# Patient Record
Sex: Female | Born: 1980
Health system: Southern US, Community
[De-identification: ages and names within clinical notes are randomized; demographics above are authoritative.]

## PROBLEM LIST (undated history)

## (undated) DIAGNOSIS — T783XXA Angioneurotic edema, initial encounter: Secondary | ICD-10-CM

## (undated) DIAGNOSIS — I77810 Thoracic aortic ectasia: Secondary | ICD-10-CM

## (undated) DIAGNOSIS — R569 Unspecified convulsions: Secondary | ICD-10-CM

## (undated) DIAGNOSIS — I341 Nonrheumatic mitral (valve) prolapse: Secondary | ICD-10-CM

## (undated) DIAGNOSIS — F419 Anxiety disorder, unspecified: Secondary | ICD-10-CM

## (undated) DIAGNOSIS — T8859XA Other complications of anesthesia, initial encounter: Secondary | ICD-10-CM

## (undated) DIAGNOSIS — R51 Headache: Secondary | ICD-10-CM

## (undated) DIAGNOSIS — R079 Chest pain, unspecified: Secondary | ICD-10-CM

## (undated) DIAGNOSIS — G4730 Sleep apnea, unspecified: Secondary | ICD-10-CM

## (undated) DIAGNOSIS — N83209 Unspecified ovarian cyst, unspecified side: Secondary | ICD-10-CM

## (undated) DIAGNOSIS — F32A Depression, unspecified: Secondary | ICD-10-CM

## (undated) DIAGNOSIS — U071 COVID-19: Secondary | ICD-10-CM

## (undated) DIAGNOSIS — K760 Fatty (change of) liver, not elsewhere classified: Secondary | ICD-10-CM

## (undated) DIAGNOSIS — Z973 Presence of spectacles and contact lenses: Secondary | ICD-10-CM

## (undated) DIAGNOSIS — M797 Fibromyalgia: Secondary | ICD-10-CM

## (undated) DIAGNOSIS — K219 Gastro-esophageal reflux disease without esophagitis: Secondary | ICD-10-CM

## (undated) DIAGNOSIS — J189 Pneumonia, unspecified organism: Secondary | ICD-10-CM

## (undated) DIAGNOSIS — T4145XA Adverse effect of unspecified anesthetic, initial encounter: Secondary | ICD-10-CM

## (undated) DIAGNOSIS — D649 Anemia, unspecified: Secondary | ICD-10-CM

## (undated) DIAGNOSIS — Z87442 Personal history of urinary calculi: Secondary | ICD-10-CM

## (undated) DIAGNOSIS — J329 Chronic sinusitis, unspecified: Secondary | ICD-10-CM

## (undated) DIAGNOSIS — E119 Type 2 diabetes mellitus without complications: Secondary | ICD-10-CM

## (undated) DIAGNOSIS — F329 Major depressive disorder, single episode, unspecified: Secondary | ICD-10-CM

## (undated) HISTORY — DX: Unspecified convulsions: R56.9

## (undated) HISTORY — DX: Thoracic aortic ectasia: I77.810

## (undated) HISTORY — PX: KNEE ARTHROSCOPY: SHX127

## (undated) HISTORY — DX: Chest pain, unspecified: R07.9

## (undated) HISTORY — DX: Unspecified ovarian cyst, unspecified side: N83.209

## (undated) HISTORY — DX: Depression, unspecified: F32.A

## (undated) HISTORY — DX: Fatty (change of) liver, not elsewhere classified: K76.0

## (undated) HISTORY — DX: Fibromyalgia: M79.7

## (undated) HISTORY — DX: Angioneurotic edema, initial encounter: T78.3XXA

## (undated) HISTORY — DX: Type 2 diabetes mellitus without complications: E11.9

## (undated) HISTORY — PX: TUBAL LIGATION: SHX77

## (undated) HISTORY — DX: Major depressive disorder, single episode, unspecified: F32.9

## (undated) SURGERY — Surgical Case
Anesthesia: *Unknown

---
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
## 1998-08-25 ENCOUNTER — Ambulatory Visit (HOSPITAL_COMMUNITY): Admission: RE | Admit: 1998-08-25 | Discharge: 1998-08-25 | Payer: Self-pay | Admitting: *Deleted

## 2001-01-13 ENCOUNTER — Emergency Department (HOSPITAL_COMMUNITY): Admission: EM | Admit: 2001-01-13 | Discharge: 2001-01-13 | Payer: Self-pay | Admitting: *Deleted

## 2001-01-18 ENCOUNTER — Encounter: Admission: RE | Admit: 2001-01-18 | Discharge: 2001-01-18 | Payer: Self-pay | Admitting: Family Medicine

## 2001-01-18 ENCOUNTER — Encounter: Payer: Self-pay | Admitting: Family Medicine

## 2002-05-20 ENCOUNTER — Inpatient Hospital Stay (HOSPITAL_COMMUNITY): Admission: AD | Admit: 2002-05-20 | Discharge: 2002-05-20 | Payer: Self-pay | Admitting: Obstetrics and Gynecology

## 2002-05-21 ENCOUNTER — Encounter: Payer: Self-pay | Admitting: Emergency Medicine

## 2002-05-21 ENCOUNTER — Emergency Department (HOSPITAL_COMMUNITY): Admission: EM | Admit: 2002-05-21 | Discharge: 2002-05-21 | Payer: Self-pay | Admitting: Emergency Medicine

## 2003-09-24 ENCOUNTER — Emergency Department (HOSPITAL_COMMUNITY): Admission: AD | Admit: 2003-09-24 | Discharge: 2003-09-24 | Payer: Self-pay | Admitting: Family Medicine

## 2003-11-10 ENCOUNTER — Emergency Department (HOSPITAL_COMMUNITY): Admission: AD | Admit: 2003-11-10 | Discharge: 2003-11-10 | Payer: Self-pay | Admitting: Family Medicine

## 2004-01-17 ENCOUNTER — Emergency Department (HOSPITAL_COMMUNITY): Admission: AD | Admit: 2004-01-17 | Discharge: 2004-01-17 | Payer: Self-pay | Admitting: Emergency Medicine

## 2004-05-27 ENCOUNTER — Emergency Department (HOSPITAL_COMMUNITY): Admission: EM | Admit: 2004-05-27 | Discharge: 2004-05-27 | Payer: Self-pay | Admitting: Family Medicine

## 2004-09-10 ENCOUNTER — Emergency Department (HOSPITAL_COMMUNITY): Admission: EM | Admit: 2004-09-10 | Discharge: 2004-09-11 | Payer: Self-pay | Admitting: Emergency Medicine

## 2004-10-05 ENCOUNTER — Other Ambulatory Visit: Admission: RE | Admit: 2004-10-05 | Discharge: 2004-10-05 | Payer: Self-pay | Admitting: Family Medicine

## 2005-02-03 ENCOUNTER — Emergency Department (HOSPITAL_COMMUNITY): Admission: EM | Admit: 2005-02-03 | Discharge: 2005-02-03 | Payer: Self-pay | Admitting: Family Medicine

## 2005-02-11 ENCOUNTER — Emergency Department (HOSPITAL_COMMUNITY): Admission: EM | Admit: 2005-02-11 | Discharge: 2005-02-11 | Payer: Self-pay | Admitting: Family Medicine

## 2005-02-16 ENCOUNTER — Ambulatory Visit (HOSPITAL_COMMUNITY): Admission: RE | Admit: 2005-02-16 | Discharge: 2005-02-16 | Payer: Self-pay | Admitting: Obstetrics & Gynecology

## 2005-02-21 ENCOUNTER — Emergency Department (HOSPITAL_COMMUNITY): Admission: EM | Admit: 2005-02-21 | Discharge: 2005-02-21 | Payer: Self-pay | Admitting: Family Medicine

## 2005-03-21 ENCOUNTER — Ambulatory Visit (HOSPITAL_COMMUNITY): Admission: RE | Admit: 2005-03-21 | Discharge: 2005-03-21 | Payer: Self-pay | Admitting: Obstetrics & Gynecology

## 2005-03-31 ENCOUNTER — Ambulatory Visit (HOSPITAL_COMMUNITY): Admission: RE | Admit: 2005-03-31 | Discharge: 2005-03-31 | Payer: Self-pay | Admitting: Obstetrics & Gynecology

## 2005-03-31 ENCOUNTER — Encounter (INDEPENDENT_AMBULATORY_CARE_PROVIDER_SITE_OTHER): Payer: Self-pay | Admitting: *Deleted

## 2005-12-07 ENCOUNTER — Emergency Department (HOSPITAL_COMMUNITY): Admission: EM | Admit: 2005-12-07 | Discharge: 2005-12-07 | Payer: Self-pay | Admitting: Emergency Medicine

## 2006-01-05 ENCOUNTER — Emergency Department (HOSPITAL_COMMUNITY): Admission: EM | Admit: 2006-01-05 | Discharge: 2006-01-05 | Payer: Self-pay | Admitting: Family Medicine

## 2006-01-11 ENCOUNTER — Encounter: Admission: RE | Admit: 2006-01-11 | Discharge: 2006-01-11 | Payer: Self-pay | Admitting: Gastroenterology

## 2006-01-26 ENCOUNTER — Ambulatory Visit (HOSPITAL_COMMUNITY): Admission: RE | Admit: 2006-01-26 | Discharge: 2006-01-26 | Payer: Self-pay | Admitting: Gastroenterology

## 2006-02-01 ENCOUNTER — Encounter: Admission: RE | Admit: 2006-02-01 | Discharge: 2006-02-01 | Payer: Self-pay | Admitting: Gastroenterology

## 2006-06-18 ENCOUNTER — Emergency Department (HOSPITAL_COMMUNITY): Admission: EM | Admit: 2006-06-18 | Discharge: 2006-06-18 | Payer: Self-pay | Admitting: Emergency Medicine

## 2006-07-28 ENCOUNTER — Emergency Department (HOSPITAL_COMMUNITY): Admission: EM | Admit: 2006-07-28 | Discharge: 2006-07-29 | Payer: Self-pay | Admitting: Emergency Medicine

## 2006-08-05 ENCOUNTER — Emergency Department (HOSPITAL_COMMUNITY): Admission: EM | Admit: 2006-08-05 | Discharge: 2006-08-05 | Payer: Self-pay | Admitting: Emergency Medicine

## 2006-08-23 ENCOUNTER — Emergency Department (HOSPITAL_COMMUNITY): Admission: EM | Admit: 2006-08-23 | Discharge: 2006-08-23 | Payer: Self-pay | Admitting: *Deleted

## 2006-08-29 ENCOUNTER — Ambulatory Visit (HOSPITAL_BASED_OUTPATIENT_CLINIC_OR_DEPARTMENT_OTHER): Admission: RE | Admit: 2006-08-29 | Discharge: 2006-08-29 | Payer: Self-pay | Admitting: Urology

## 2006-11-16 ENCOUNTER — Emergency Department (HOSPITAL_COMMUNITY): Admission: EM | Admit: 2006-11-16 | Discharge: 2006-11-16 | Payer: Self-pay | Admitting: Family Medicine

## 2007-01-27 ENCOUNTER — Inpatient Hospital Stay (HOSPITAL_COMMUNITY): Admission: AD | Admit: 2007-01-27 | Discharge: 2007-01-27 | Payer: Self-pay | Admitting: Obstetrics & Gynecology

## 2007-07-06 ENCOUNTER — Inpatient Hospital Stay (HOSPITAL_COMMUNITY): Admission: AD | Admit: 2007-07-06 | Discharge: 2007-07-08 | Payer: Self-pay | Admitting: Obstetrics and Gynecology

## 2007-11-18 ENCOUNTER — Emergency Department (HOSPITAL_COMMUNITY): Admission: EM | Admit: 2007-11-18 | Discharge: 2007-11-18 | Payer: Self-pay | Admitting: Emergency Medicine

## 2007-11-23 ENCOUNTER — Emergency Department (HOSPITAL_COMMUNITY): Admission: EM | Admit: 2007-11-23 | Discharge: 2007-11-23 | Payer: Self-pay | Admitting: Emergency Medicine

## 2007-12-03 ENCOUNTER — Emergency Department (HOSPITAL_COMMUNITY): Admission: EM | Admit: 2007-12-03 | Discharge: 2007-12-03 | Payer: Self-pay | Admitting: Family Medicine

## 2008-08-02 ENCOUNTER — Emergency Department (HOSPITAL_COMMUNITY): Admission: EM | Admit: 2008-08-02 | Discharge: 2008-08-03 | Payer: Self-pay | Admitting: Emergency Medicine

## 2008-09-04 ENCOUNTER — Ambulatory Visit: Payer: Self-pay | Admitting: Internal Medicine

## 2008-09-16 ENCOUNTER — Ambulatory Visit: Payer: Self-pay

## 2008-09-16 ENCOUNTER — Encounter: Payer: Self-pay | Admitting: Internal Medicine

## 2008-10-14 ENCOUNTER — Observation Stay (HOSPITAL_COMMUNITY): Admission: AD | Admit: 2008-10-14 | Discharge: 2008-10-15 | Payer: Self-pay | Admitting: Obstetrics and Gynecology

## 2008-10-15 ENCOUNTER — Encounter: Payer: Self-pay | Admitting: Obstetrics and Gynecology

## 2008-10-31 ENCOUNTER — Emergency Department (HOSPITAL_COMMUNITY): Admission: EM | Admit: 2008-10-31 | Discharge: 2008-10-31 | Payer: Self-pay | Admitting: Emergency Medicine

## 2009-01-01 ENCOUNTER — Encounter: Payer: Self-pay | Admitting: Emergency Medicine

## 2009-01-02 ENCOUNTER — Observation Stay (HOSPITAL_COMMUNITY): Admission: AD | Admit: 2009-01-02 | Discharge: 2009-01-02 | Payer: Self-pay | Admitting: Obstetrics and Gynecology

## 2009-02-11 ENCOUNTER — Observation Stay (HOSPITAL_COMMUNITY): Admission: AD | Admit: 2009-02-11 | Discharge: 2009-02-12 | Payer: Self-pay | Admitting: Obstetrics & Gynecology

## 2009-03-24 ENCOUNTER — Ambulatory Visit: Payer: Self-pay | Admitting: Pulmonary Disease

## 2009-03-24 ENCOUNTER — Encounter (INDEPENDENT_AMBULATORY_CARE_PROVIDER_SITE_OTHER): Payer: Self-pay | Admitting: Obstetrics & Gynecology

## 2009-03-24 ENCOUNTER — Inpatient Hospital Stay (HOSPITAL_COMMUNITY): Admission: AD | Admit: 2009-03-24 | Discharge: 2009-03-30 | Payer: Self-pay | Admitting: Obstetrics & Gynecology

## 2009-03-24 ENCOUNTER — Ambulatory Visit: Payer: Self-pay | Admitting: Obstetrics & Gynecology

## 2009-04-02 ENCOUNTER — Inpatient Hospital Stay (HOSPITAL_COMMUNITY): Admission: AD | Admit: 2009-04-02 | Discharge: 2009-04-04 | Payer: Self-pay | Admitting: Obstetrics and Gynecology

## 2009-04-15 ENCOUNTER — Emergency Department (HOSPITAL_COMMUNITY): Admission: EM | Admit: 2009-04-15 | Discharge: 2009-04-16 | Payer: Self-pay | Admitting: Emergency Medicine

## 2009-07-11 ENCOUNTER — Ambulatory Visit: Payer: Self-pay | Admitting: Internal Medicine

## 2009-07-11 ENCOUNTER — Emergency Department (HOSPITAL_COMMUNITY): Admission: EM | Admit: 2009-07-11 | Discharge: 2009-07-11 | Payer: Self-pay | Admitting: Emergency Medicine

## 2009-07-11 ENCOUNTER — Inpatient Hospital Stay (HOSPITAL_COMMUNITY): Admission: EM | Admit: 2009-07-11 | Discharge: 2009-07-14 | Payer: Self-pay | Admitting: Emergency Medicine

## 2009-07-13 ENCOUNTER — Encounter (INDEPENDENT_AMBULATORY_CARE_PROVIDER_SITE_OTHER): Payer: Self-pay | Admitting: Neurology

## 2009-07-13 ENCOUNTER — Ambulatory Visit: Payer: Self-pay | Admitting: Surgery

## 2009-08-10 ENCOUNTER — Emergency Department (HOSPITAL_COMMUNITY): Admission: EM | Admit: 2009-08-10 | Discharge: 2009-08-10 | Payer: Self-pay | Admitting: Emergency Medicine

## 2009-10-21 ENCOUNTER — Encounter: Admission: RE | Admit: 2009-10-21 | Discharge: 2009-10-21 | Payer: Self-pay | Admitting: Orthopedic Surgery

## 2009-11-13 ENCOUNTER — Ambulatory Visit (HOSPITAL_COMMUNITY): Admission: RE | Admit: 2009-11-13 | Discharge: 2009-11-13 | Payer: Self-pay | Admitting: Neurology

## 2010-04-13 ENCOUNTER — Emergency Department (HOSPITAL_COMMUNITY): Admission: EM | Admit: 2010-04-13 | Discharge: 2010-04-13 | Payer: Self-pay | Admitting: Emergency Medicine

## 2010-05-20 ENCOUNTER — Emergency Department (HOSPITAL_COMMUNITY): Admission: EM | Admit: 2010-05-20 | Discharge: 2010-05-20 | Payer: Self-pay | Admitting: Emergency Medicine

## 2010-07-08 ENCOUNTER — Emergency Department (HOSPITAL_COMMUNITY)
Admission: EM | Admit: 2010-07-08 | Discharge: 2010-07-08 | Payer: Self-pay | Source: Home / Self Care | Admitting: Emergency Medicine

## 2010-08-24 ENCOUNTER — Inpatient Hospital Stay (HOSPITAL_COMMUNITY): Admission: EM | Admit: 2010-08-24 | Discharge: 2010-08-26 | Payer: Self-pay | Admitting: Emergency Medicine

## 2010-12-29 ENCOUNTER — Emergency Department (HOSPITAL_COMMUNITY)
Admission: EM | Admit: 2010-12-29 | Discharge: 2010-12-29 | Payer: Self-pay | Source: Home / Self Care | Admitting: Family Medicine

## 2010-12-29 LAB — POCT URINALYSIS DIPSTICK
Bilirubin Urine: NEGATIVE
Hgb urine dipstick: NEGATIVE
Ketones, ur: NEGATIVE mg/dL
Nitrite: NEGATIVE
Protein, ur: NEGATIVE mg/dL
Specific Gravity, Urine: 1.025 (ref 1.005–1.030)
Urine Glucose, Fasting: NEGATIVE mg/dL
Urobilinogen, UA: 0.2 mg/dL (ref 0.0–1.0)
pH: 6 (ref 5.0–8.0)

## 2010-12-29 LAB — POCT PREGNANCY, URINE: Preg Test, Ur: NEGATIVE

## 2011-02-17 LAB — URINE MICROSCOPIC-ADD ON

## 2011-02-17 LAB — DIFFERENTIAL
Basophils Absolute: 0 10*3/uL (ref 0.0–0.1)
Basophils Relative: 0 % (ref 0–1)
Eosinophils Absolute: 0.1 10*3/uL (ref 0.0–0.7)
Eosinophils Relative: 1 % (ref 0–5)
Lymphocytes Relative: 23 % (ref 12–46)
Lymphs Abs: 1.7 10*3/uL (ref 0.7–4.0)
Monocytes Absolute: 0.4 10*3/uL (ref 0.1–1.0)
Monocytes Relative: 6 % (ref 3–12)
Neutro Abs: 5.1 10*3/uL (ref 1.7–7.7)
Neutrophils Relative %: 69 % (ref 43–77)

## 2011-02-17 LAB — CBC
HCT: 36.8 % (ref 36.0–46.0)
HCT: 38.3 % (ref 36.0–46.0)
Hemoglobin: 11.9 g/dL — ABNORMAL LOW (ref 12.0–15.0)
Hemoglobin: 12.6 g/dL (ref 12.0–15.0)
MCH: 27 pg (ref 26.0–34.0)
MCH: 27.3 pg (ref 26.0–34.0)
MCHC: 32.3 g/dL (ref 30.0–36.0)
MCHC: 32.9 g/dL (ref 30.0–36.0)
MCV: 83.1 fL (ref 78.0–100.0)
MCV: 83.4 fL (ref 78.0–100.0)
Platelets: 161 10*3/uL (ref 150–400)
Platelets: 178 10*3/uL (ref 150–400)
RBC: 4.41 MIL/uL (ref 3.87–5.11)
RBC: 4.61 MIL/uL (ref 3.87–5.11)
RDW: 12.9 % (ref 11.5–15.5)
RDW: 13 % (ref 11.5–15.5)
WBC: 7.3 10*3/uL (ref 4.0–10.5)
WBC: 7.4 10*3/uL (ref 4.0–10.5)

## 2011-02-17 LAB — URINALYSIS, ROUTINE W REFLEX MICROSCOPIC
Bilirubin Urine: NEGATIVE
Glucose, UA: NEGATIVE mg/dL
Ketones, ur: NEGATIVE mg/dL
Nitrite: NEGATIVE
Protein, ur: NEGATIVE mg/dL
Specific Gravity, Urine: 1.012 (ref 1.005–1.030)
Urobilinogen, UA: 0.2 mg/dL (ref 0.0–1.0)
pH: 6.5 (ref 5.0–8.0)

## 2011-02-17 LAB — COMPREHENSIVE METABOLIC PANEL
ALT: 17 U/L (ref 0–35)
AST: 23 U/L (ref 0–37)
Albumin: 3.4 g/dL — ABNORMAL LOW (ref 3.5–5.2)
Alkaline Phosphatase: 56 U/L (ref 39–117)
BUN: 11 mg/dL (ref 6–23)
CO2: 27 mEq/L (ref 19–32)
Calcium: 9.3 mg/dL (ref 8.4–10.5)
Chloride: 103 mEq/L (ref 96–112)
Creatinine, Ser: 0.77 mg/dL (ref 0.4–1.2)
GFR calc Af Amer: >60 mL/min (ref >60)
GFR calc non Af Amer: >60 mL/min (ref >60)
Glucose, Bld: 218 mg/dL — ABNORMAL HIGH (ref 70–99)
Potassium: 4.1 mEq/L (ref 3.5–5.1)
Sodium: 140 mEq/L (ref 135–145)
Total Bilirubin: 0.2 mg/dL — ABNORMAL LOW (ref 0.3–1.2)
Total Protein: 7 g/dL (ref 6.0–8.3)

## 2011-02-17 LAB — POCT I-STAT, CHEM 8
BUN: 7 mg/dL (ref 6–23)
Calcium, Ion: 1.16 mmol/L (ref 1.12–1.32)
Chloride: 102 mEq/L (ref 96–112)
Creatinine, Ser: 0.6 mg/dL (ref 0.4–1.2)
Glucose, Bld: 102 mg/dL — ABNORMAL HIGH (ref 70–99)
HCT: 40 % (ref 36.0–46.0)
Hemoglobin: 13.6 g/dL (ref 12.0–15.0)
Potassium: 3.5 mEq/L (ref 3.5–5.1)
Sodium: 138 mEq/L (ref 135–145)
TCO2: 28 mmol/L (ref 0–100)

## 2011-02-17 LAB — SEDIMENTATION RATE: Sed Rate: 32 mm/hr — ABNORMAL HIGH (ref 0–22)

## 2011-02-17 LAB — HEMOGLOBIN A1C
Hgb A1c MFr Bld: 5.8 % — ABNORMAL HIGH (ref <5.7)
Mean Plasma Glucose: 120 mg/dL — ABNORMAL HIGH (ref <117)

## 2011-02-17 LAB — PREGNANCY, URINE: Preg Test, Ur: NEGATIVE

## 2011-02-18 LAB — DIFFERENTIAL
Basophils Absolute: 0 10*3/uL (ref 0.0–0.1)
Basophils Relative: 0 % (ref 0–1)
Eosinophils Absolute: 0.2 10*3/uL (ref 0.0–0.7)
Eosinophils Relative: 2 % (ref 0–5)
Lymphocytes Relative: 25 % (ref 12–46)
Lymphs Abs: 2.5 10*3/uL (ref 0.7–4.0)
Monocytes Absolute: 0.6 10*3/uL (ref 0.1–1.0)
Monocytes Relative: 6 % (ref 3–12)
Neutro Abs: 6.6 10*3/uL (ref 1.7–7.7)
Neutrophils Relative %: 66 % (ref 43–77)

## 2011-02-18 LAB — BASIC METABOLIC PANEL
BUN: 9 mg/dL (ref 6–23)
CO2: 25 mEq/L (ref 19–32)
Calcium: 9.1 mg/dL (ref 8.4–10.5)
Chloride: 105 mEq/L (ref 96–112)
Creatinine, Ser: 0.71 mg/dL (ref 0.4–1.2)
GFR calc Af Amer: >60 mL/min (ref >60)
GFR calc non Af Amer: >60 mL/min (ref >60)
Glucose, Bld: 118 mg/dL — ABNORMAL HIGH (ref 70–99)
Potassium: 3.8 mEq/L (ref 3.5–5.1)
Sodium: 139 mEq/L (ref 135–145)

## 2011-02-18 LAB — CBC
HCT: 40.3 % (ref 36.0–46.0)
Hemoglobin: 13.3 g/dL (ref 12.0–15.0)
MCH: 27.5 pg (ref 26.0–34.0)
MCHC: 33 g/dL (ref 30.0–36.0)
MCV: 83.4 fL (ref 78.0–100.0)
Platelets: 165 10*3/uL (ref 150–400)
RBC: 4.83 MIL/uL (ref 3.87–5.11)
RDW: 13.4 % (ref 11.5–15.5)
WBC: 10 10*3/uL (ref 4.0–10.5)

## 2011-03-12 LAB — CK TOTAL AND CKMB (NOT AT ARMC)
CK, MB: 0.9 ng/mL (ref 0.3–4.0)
Relative Index: INVALID (ref 0.0–2.5)
Total CK: 47 U/L (ref 7–177)

## 2011-03-12 LAB — ETHANOL: Alcohol, Ethyl (B): <5 mg/dL (ref 0–10)

## 2011-03-12 LAB — RAPID URINE DRUG SCREEN, HOSP PERFORMED
Amphetamines: NOT DETECTED
Barbiturates: NOT DETECTED
Benzodiazepines: NOT DETECTED
Cocaine: NOT DETECTED
Opiates: NOT DETECTED
Tetrahydrocannabinol: NOT DETECTED

## 2011-03-12 LAB — DIFFERENTIAL
Basophils Absolute: 0 10*3/uL (ref 0.0–0.1)
Basophils Relative: 0 % (ref 0–1)
Eosinophils Absolute: 0.1 10*3/uL (ref 0.0–0.7)
Eosinophils Relative: 2 % (ref 0–5)
Lymphocytes Relative: 32 % (ref 12–46)
Lymphs Abs: 1.9 10*3/uL (ref 0.7–4.0)
Monocytes Absolute: 0.5 10*3/uL (ref 0.1–1.0)
Monocytes Relative: 8 % (ref 3–12)
Neutro Abs: 3.5 10*3/uL (ref 1.7–7.7)
Neutrophils Relative %: 58 % (ref 43–77)

## 2011-03-12 LAB — COMPREHENSIVE METABOLIC PANEL
ALT: 17 U/L (ref 0–35)
AST: 28 U/L (ref 0–37)
Albumin: 3.6 g/dL (ref 3.5–5.2)
Alkaline Phosphatase: 65 U/L (ref 39–117)
BUN: 11 mg/dL (ref 6–23)
CO2: 24 mEq/L (ref 19–32)
Calcium: 8.2 mg/dL — ABNORMAL LOW (ref 8.4–10.5)
Chloride: 104 mEq/L (ref 96–112)
Creatinine, Ser: 0.68 mg/dL (ref 0.4–1.2)
GFR calc Af Amer: >60 mL/min (ref >60)
GFR calc non Af Amer: >60 mL/min (ref >60)
Glucose, Bld: 213 mg/dL — ABNORMAL HIGH (ref 70–99)
Potassium: 3.5 mEq/L (ref 3.5–5.1)
Sodium: 133 mEq/L — ABNORMAL LOW (ref 135–145)
Total Bilirubin: 0.3 mg/dL (ref 0.3–1.2)
Total Protein: 6.8 g/dL (ref 6.0–8.3)

## 2011-03-12 LAB — URINALYSIS, ROUTINE W REFLEX MICROSCOPIC
Bilirubin Urine: NEGATIVE
Glucose, UA: NEGATIVE mg/dL
Hgb urine dipstick: NEGATIVE
Ketones, ur: NEGATIVE mg/dL
Leukocytes, UA: NEGATIVE
Nitrite: POSITIVE — AB
Protein, ur: NEGATIVE mg/dL
Specific Gravity, Urine: 1.02 (ref 1.005–1.030)
Urobilinogen, UA: 0.2 mg/dL (ref 0.0–1.0)
pH: 7 (ref 5.0–8.0)

## 2011-03-12 LAB — POCT I-STAT, CHEM 8
BUN: 16 mg/dL (ref 6–23)
Calcium, Ion: 1.18 mmol/L (ref 1.12–1.32)
Chloride: 101 mEq/L (ref 96–112)
Creatinine, Ser: 0.6 mg/dL (ref 0.4–1.2)
Glucose, Bld: 64 mg/dL — ABNORMAL LOW (ref 70–99)
HCT: 43 % (ref 36.0–46.0)
Hemoglobin: 14.6 g/dL (ref 12.0–15.0)
Potassium: 3.9 mEq/L (ref 3.5–5.1)
Sodium: 139 mEq/L (ref 135–145)
TCO2: 27 mmol/L (ref 0–100)

## 2011-03-12 LAB — CBC
HCT: 39.8 % (ref 36.0–46.0)
Hemoglobin: 13.3 g/dL (ref 12.0–15.0)
MCHC: 33.4 g/dL (ref 30.0–36.0)
MCV: 85.3 fL (ref 78.0–100.0)
Platelets: 162 10*3/uL (ref 150–400)
RBC: 4.67 MIL/uL (ref 3.87–5.11)
RDW: 12.6 % (ref 11.5–15.5)
WBC: 6.1 10*3/uL (ref 4.0–10.5)

## 2011-03-12 LAB — HEMOGLOBIN A1C
Hgb A1c MFr Bld: 5.2 % (ref 4.6–6.1)
Mean Plasma Glucose: 103 mg/dL

## 2011-03-12 LAB — GLUCOSE, CAPILLARY
Glucose-Capillary: 102 mg/dL — ABNORMAL HIGH (ref 70–99)
Glucose-Capillary: 104 mg/dL — ABNORMAL HIGH (ref 70–99)
Glucose-Capillary: 124 mg/dL — ABNORMAL HIGH (ref 70–99)
Glucose-Capillary: 128 mg/dL — ABNORMAL HIGH (ref 70–99)
Glucose-Capillary: 165 mg/dL — ABNORMAL HIGH (ref 70–99)
Glucose-Capillary: 199 mg/dL — ABNORMAL HIGH (ref 70–99)
Glucose-Capillary: 71 mg/dL (ref 70–99)
Glucose-Capillary: 72 mg/dL (ref 70–99)
Glucose-Capillary: 76 mg/dL (ref 70–99)
Glucose-Capillary: 78 mg/dL (ref 70–99)

## 2011-03-12 LAB — URINE MICROSCOPIC-ADD ON

## 2011-03-12 LAB — LIPID PANEL
Cholesterol: 157 mg/dL (ref 0–200)
HDL: 61 mg/dL (ref >39)
LDL Cholesterol: 86 mg/dL (ref 0–99)
Total CHOL/HDL Ratio: 2.6 RATIO
Triglycerides: 48 mg/dL (ref <150)
VLDL: 10 mg/dL (ref 0–40)

## 2011-03-12 LAB — POCT PREGNANCY, URINE: Preg Test, Ur: NEGATIVE

## 2011-03-12 LAB — PROTIME-INR
INR: 1 (ref 0.00–1.49)
Prothrombin Time: 12.8 seconds (ref 11.6–15.2)

## 2011-03-12 LAB — APTT: aPTT: 32 seconds (ref 24–37)

## 2011-03-12 LAB — TROPONIN I: Troponin I: <0.01 ng/mL (ref 0.00–0.06)

## 2011-03-15 LAB — DIFFERENTIAL
Basophils Absolute: 0 10*3/uL (ref 0.0–0.1)
Basophils Absolute: 0 10*3/uL (ref 0.0–0.1)
Basophils Relative: 0 % (ref 0–1)
Basophils Relative: 0 % (ref 0–1)
Eosinophils Absolute: 0 10*3/uL (ref 0.0–0.7)
Eosinophils Absolute: 0.3 10*3/uL (ref 0.0–0.7)
Eosinophils Relative: 0 % (ref 0–5)
Eosinophils Relative: 3 % (ref 0–5)
Lymphocytes Relative: 13 % (ref 12–46)
Lymphocytes Relative: 18 % (ref 12–46)
Lymphs Abs: 1.5 10*3/uL (ref 0.7–4.0)
Lymphs Abs: 1.9 10*3/uL (ref 0.7–4.0)
Monocytes Absolute: 0.5 10*3/uL (ref 0.1–1.0)
Monocytes Absolute: 0.7 10*3/uL (ref 0.1–1.0)
Monocytes Relative: 5 % (ref 3–12)
Monocytes Relative: 7 % (ref 3–12)
Neutro Abs: 7.4 10*3/uL (ref 1.7–7.7)
Neutro Abs: 9.1 10*3/uL — ABNORMAL HIGH (ref 1.7–7.7)
Neutrophils Relative %: 72 % (ref 43–77)
Neutrophils Relative %: 82 % — ABNORMAL HIGH (ref 43–77)

## 2011-03-15 LAB — URINALYSIS, ROUTINE W REFLEX MICROSCOPIC
Glucose, UA: NEGATIVE mg/dL
Hgb urine dipstick: NEGATIVE
Nitrite: NEGATIVE
Protein, ur: NEGATIVE mg/dL
Specific Gravity, Urine: 1.025 (ref 1.005–1.030)
Urobilinogen, UA: 0.2 mg/dL (ref 0.0–1.0)
pH: 6 (ref 5.0–8.0)

## 2011-03-15 LAB — COMPREHENSIVE METABOLIC PANEL
ALT: 13 U/L (ref 0–35)
AST: 16 U/L (ref 0–37)
Albumin: 3.1 g/dL — ABNORMAL LOW (ref 3.5–5.2)
Alkaline Phosphatase: 60 U/L (ref 39–117)
BUN: 14 mg/dL (ref 6–23)
CO2: 26 mEq/L (ref 19–32)
Calcium: 9.1 mg/dL (ref 8.4–10.5)
Chloride: 106 mEq/L (ref 96–112)
Creatinine, Ser: 0.68 mg/dL (ref 0.4–1.2)
GFR calc Af Amer: >60 mL/min (ref >60)
GFR calc non Af Amer: >60 mL/min (ref >60)
Glucose, Bld: 102 mg/dL — ABNORMAL HIGH (ref 70–99)
Potassium: 4.7 mEq/L (ref 3.5–5.1)
Sodium: 138 mEq/L (ref 135–145)
Total Bilirubin: 0.4 mg/dL (ref 0.3–1.2)
Total Protein: 6.2 g/dL (ref 6.0–8.3)

## 2011-03-15 LAB — CBC
HCT: 29.9 % — ABNORMAL LOW (ref 36.0–46.0)
HCT: 31.7 % — ABNORMAL LOW (ref 36.0–46.0)
Hemoglobin: 10.2 g/dL — ABNORMAL LOW (ref 12.0–15.0)
Hemoglobin: 10.9 g/dL — ABNORMAL LOW (ref 12.0–15.0)
MCHC: 34 g/dL (ref 30.0–36.0)
MCHC: 34.6 g/dL (ref 30.0–36.0)
MCV: 90 fL (ref 78.0–100.0)
MCV: 92.3 fL (ref 78.0–100.0)
Platelets: 250 10*3/uL (ref 150–400)
Platelets: 270 10*3/uL (ref 150–400)
RBC: 3.32 MIL/uL — ABNORMAL LOW (ref 3.87–5.11)
RBC: 3.43 MIL/uL — ABNORMAL LOW (ref 3.87–5.11)
RDW: 13.6 % (ref 11.5–15.5)
RDW: 13.6 % (ref 11.5–15.5)
WBC: 10.4 10*3/uL (ref 4.0–10.5)
WBC: 11.2 10*3/uL — ABNORMAL HIGH (ref 4.0–10.5)

## 2011-03-15 LAB — GC/CHLAMYDIA PROBE AMP, GENITAL
Chlamydia, DNA Probe: NEGATIVE
GC Probe Amp, Genital: NEGATIVE

## 2011-03-15 LAB — WET PREP, GENITAL
Clue Cells Wet Prep HPF POC: NONE SEEN
Trich, Wet Prep: NONE SEEN
Yeast Wet Prep HPF POC: NONE SEEN

## 2011-03-15 LAB — LIPASE, BLOOD: Lipase: 14 U/L (ref 11–59)

## 2011-03-16 LAB — CBC
HCT: 14.1 % — ABNORMAL LOW (ref 36.0–46.0)
HCT: 26.7 % — ABNORMAL LOW (ref 36.0–46.0)
HCT: 26.8 % — ABNORMAL LOW (ref 36.0–46.0)
HCT: 27.8 % — ABNORMAL LOW (ref 36.0–46.0)
HCT: 31.2 % — ABNORMAL LOW (ref 36.0–46.0)
HCT: 31.6 % — ABNORMAL LOW (ref 36.0–46.0)
HCT: 32.8 % — ABNORMAL LOW (ref 36.0–46.0)
HCT: 36.5 % (ref 36.0–46.0)
HCT: 38.5 % (ref 36.0–46.0)
HCT: 38.6 % (ref 36.0–46.0)
HCT: 38.8 % (ref 36.0–46.0)
Hemoglobin: 10.9 g/dL — ABNORMAL LOW (ref 12.0–15.0)
Hemoglobin: 11.2 g/dL — ABNORMAL LOW (ref 12.0–15.0)
Hemoglobin: 11.4 g/dL — ABNORMAL LOW (ref 12.0–15.0)
Hemoglobin: 12.9 g/dL (ref 12.0–15.0)
Hemoglobin: 13.3 g/dL (ref 12.0–15.0)
Hemoglobin: 13.3 g/dL (ref 12.0–15.0)
Hemoglobin: 13.6 g/dL (ref 12.0–15.0)
Hemoglobin: 4.7 g/dL — CL (ref 12.0–15.0)
Hemoglobin: 9.1 g/dL — ABNORMAL LOW (ref 12.0–15.0)
Hemoglobin: 9.5 g/dL — ABNORMAL LOW (ref 12.0–15.0)
Hemoglobin: 9.7 g/dL — ABNORMAL LOW (ref 12.0–15.0)
MCHC: 33.4 g/dL (ref 30.0–36.0)
MCHC: 34.1 g/dL (ref 30.0–36.0)
MCHC: 34.2 g/dL (ref 30.0–36.0)
MCHC: 34.6 g/dL (ref 30.0–36.0)
MCHC: 34.8 g/dL (ref 30.0–36.0)
MCHC: 34.9 g/dL (ref 30.0–36.0)
MCHC: 35.1 g/dL (ref 30.0–36.0)
MCHC: 35.3 g/dL (ref 30.0–36.0)
MCHC: 35.3 g/dL (ref 30.0–36.0)
MCHC: 35.3 g/dL (ref 30.0–36.0)
MCHC: 35.5 g/dL (ref 30.0–36.0)
MCV: 90.3 fL (ref 78.0–100.0)
MCV: 90.8 fL (ref 78.0–100.0)
MCV: 92.1 fL (ref 78.0–100.0)
MCV: 92.4 fL (ref 78.0–100.0)
MCV: 92.5 fL (ref 78.0–100.0)
MCV: 92.5 fL (ref 78.0–100.0)
MCV: 92.7 fL (ref 78.0–100.0)
MCV: 92.7 fL (ref 78.0–100.0)
MCV: 93.1 fL (ref 78.0–100.0)
MCV: 93.2 fL (ref 78.0–100.0)
MCV: 93.8 fL (ref 78.0–100.0)
Platelets: 111 10*3/uL — ABNORMAL LOW (ref 150–400)
Platelets: 117 10*3/uL — ABNORMAL LOW (ref 150–400)
Platelets: 174 10*3/uL (ref 150–400)
Platelets: 237 10*3/uL (ref 150–400)
Platelets: 26 10*3/uL — CL (ref 150–400)
Platelets: 28 10*3/uL — CL (ref 150–400)
Platelets: 39 10*3/uL — CL (ref 150–400)
Platelets: 41 10*3/uL — CL (ref 150–400)
Platelets: 62 10*3/uL — ABNORMAL LOW (ref 150–400)
Platelets: 76 10*3/uL — ABNORMAL LOW (ref 150–400)
Platelets: 81 10*3/uL — ABNORMAL LOW (ref 150–400)
RBC: 1.51 MIL/uL — ABNORMAL LOW (ref 3.87–5.11)
RBC: 2.9 MIL/uL — ABNORMAL LOW (ref 3.87–5.11)
RBC: 2.94 MIL/uL — ABNORMAL LOW (ref 3.87–5.11)
RBC: 3.01 MIL/uL — ABNORMAL LOW (ref 3.87–5.11)
RBC: 3.35 MIL/uL — ABNORMAL LOW (ref 3.87–5.11)
RBC: 3.41 MIL/uL — ABNORMAL LOW (ref 3.87–5.11)
RBC: 3.56 MIL/uL — ABNORMAL LOW (ref 3.87–5.11)
RBC: 3.94 MIL/uL (ref 3.87–5.11)
RBC: 4.11 MIL/uL (ref 3.87–5.11)
RBC: 4.17 MIL/uL (ref 3.87–5.11)
RBC: 4.3 MIL/uL (ref 3.87–5.11)
RDW: 13.7 % (ref 11.5–15.5)
RDW: 13.8 % (ref 11.5–15.5)
RDW: 13.9 % (ref 11.5–15.5)
RDW: 14.4 % (ref 11.5–15.5)
RDW: 14.4 % (ref 11.5–15.5)
RDW: 14.4 % (ref 11.5–15.5)
RDW: 14.5 % (ref 11.5–15.5)
RDW: 14.6 % (ref 11.5–15.5)
RDW: 15 % (ref 11.5–15.5)
RDW: 15.2 % (ref 11.5–15.5)
RDW: 15.3 % (ref 11.5–15.5)
WBC: 10.9 10*3/uL — ABNORMAL HIGH (ref 4.0–10.5)
WBC: 11.2 10*3/uL — ABNORMAL HIGH (ref 4.0–10.5)
WBC: 16.2 10*3/uL — ABNORMAL HIGH (ref 4.0–10.5)
WBC: 18.8 10*3/uL — ABNORMAL HIGH (ref 4.0–10.5)
WBC: 21 10*3/uL — ABNORMAL HIGH (ref 4.0–10.5)
WBC: 22.1 10*3/uL — ABNORMAL HIGH (ref 4.0–10.5)
WBC: 22.7 10*3/uL — ABNORMAL HIGH (ref 4.0–10.5)
WBC: 23.7 10*3/uL — ABNORMAL HIGH (ref 4.0–10.5)
WBC: 26.4 10*3/uL — ABNORMAL HIGH (ref 4.0–10.5)
WBC: 27.7 10*3/uL — ABNORMAL HIGH (ref 4.0–10.5)
WBC: 8.6 10*3/uL (ref 4.0–10.5)

## 2011-03-16 LAB — PREPARE FRESH FROZEN PLASMA

## 2011-03-16 LAB — URINALYSIS, ROUTINE W REFLEX MICROSCOPIC
Bilirubin Urine: NEGATIVE
Glucose, UA: 100 mg/dL — AB
Glucose, UA: NEGATIVE mg/dL
Hgb urine dipstick: NEGATIVE
Ketones, ur: 40 mg/dL — AB
Ketones, ur: NEGATIVE mg/dL
Leukocytes, UA: NEGATIVE
Nitrite: NEGATIVE
Nitrite: NEGATIVE
Protein, ur: NEGATIVE mg/dL
Protein, ur: NEGATIVE mg/dL
Specific Gravity, Urine: 1.01 (ref 1.005–1.030)
Specific Gravity, Urine: 1.02 (ref 1.005–1.030)
Urobilinogen, UA: 2 mg/dL — ABNORMAL HIGH (ref 0.0–1.0)
Urobilinogen, UA: 2 mg/dL — ABNORMAL HIGH (ref 0.0–1.0)
pH: 6.5 (ref 5.0–8.0)
pH: 7 (ref 5.0–8.0)

## 2011-03-16 LAB — BASIC METABOLIC PANEL
BUN: 6 mg/dL (ref 6–23)
BUN: 8 mg/dL (ref 6–23)
CO2: 19 mEq/L (ref 19–32)
CO2: 23 mEq/L (ref 19–32)
Calcium: 7 mg/dL — ABNORMAL LOW (ref 8.4–10.5)
Calcium: 7.2 mg/dL — ABNORMAL LOW (ref 8.4–10.5)
Chloride: 105 mEq/L (ref 96–112)
Chloride: 106 mEq/L (ref 96–112)
Creatinine, Ser: 0.68 mg/dL (ref 0.4–1.2)
Creatinine, Ser: 0.69 mg/dL (ref 0.4–1.2)
GFR calc Af Amer: >60 mL/min (ref >60)
GFR calc Af Amer: >60 mL/min (ref >60)
GFR calc non Af Amer: >60 mL/min (ref >60)
GFR calc non Af Amer: >60 mL/min (ref >60)
Glucose, Bld: 134 mg/dL — ABNORMAL HIGH (ref 70–99)
Glucose, Bld: 91 mg/dL (ref 70–99)
Potassium: 3.5 mEq/L (ref 3.5–5.1)
Potassium: 3.8 mEq/L (ref 3.5–5.1)
Sodium: 133 mEq/L — ABNORMAL LOW (ref 135–145)
Sodium: 135 mEq/L (ref 135–145)

## 2011-03-16 LAB — COMPREHENSIVE METABOLIC PANEL
ALT: 17 U/L (ref 0–35)
ALT: 18 U/L (ref 0–35)
ALT: 8 U/L (ref 0–35)
AST: 20 U/L (ref 0–37)
AST: 21 U/L (ref 0–37)
AST: 21 U/L (ref 0–37)
Albumin: 1.1 g/dL — ABNORMAL LOW (ref 3.5–5.2)
Albumin: 1.7 g/dL — ABNORMAL LOW (ref 3.5–5.2)
Albumin: 2.4 g/dL — ABNORMAL LOW (ref 3.5–5.2)
Alkaline Phosphatase: 38 U/L — ABNORMAL LOW (ref 39–117)
Alkaline Phosphatase: 56 U/L (ref 39–117)
Alkaline Phosphatase: 77 U/L (ref 39–117)
BUN: 10 mg/dL (ref 6–23)
BUN: 11 mg/dL (ref 6–23)
BUN: 7 mg/dL (ref 6–23)
CO2: 14 mEq/L — ABNORMAL LOW (ref 19–32)
CO2: 26 mEq/L (ref 19–32)
CO2: 28 mEq/L (ref 19–32)
Calcium: 7.5 mg/dL — ABNORMAL LOW (ref 8.4–10.5)
Calcium: 7.8 mg/dL — ABNORMAL LOW (ref 8.4–10.5)
Calcium: 8.3 mg/dL — ABNORMAL LOW (ref 8.4–10.5)
Chloride: 103 mEq/L (ref 96–112)
Chloride: 105 mEq/L (ref 96–112)
Chloride: 108 mEq/L (ref 96–112)
Creatinine, Ser: 0.53 mg/dL (ref 0.4–1.2)
Creatinine, Ser: 0.55 mg/dL (ref 0.4–1.2)
Creatinine, Ser: 1.14 mg/dL (ref 0.4–1.2)
GFR calc Af Amer: >60 mL/min (ref >60)
GFR calc Af Amer: >60 mL/min (ref >60)
GFR calc Af Amer: >60 mL/min (ref >60)
GFR calc non Af Amer: 56 mL/min — ABNORMAL LOW (ref >60)
GFR calc non Af Amer: >60 mL/min (ref >60)
GFR calc non Af Amer: >60 mL/min (ref >60)
Glucose, Bld: 103 mg/dL — ABNORMAL HIGH (ref 70–99)
Glucose, Bld: 238 mg/dL — ABNORMAL HIGH (ref 70–99)
Glucose, Bld: 96 mg/dL (ref 70–99)
Potassium: 3.3 mEq/L — ABNORMAL LOW (ref 3.5–5.1)
Potassium: 3.6 mEq/L (ref 3.5–5.1)
Potassium: 3.9 mEq/L (ref 3.5–5.1)
Sodium: 136 mEq/L (ref 135–145)
Sodium: 138 mEq/L (ref 135–145)
Sodium: 138 mEq/L (ref 135–145)
Total Bilirubin: 0.3 mg/dL (ref 0.3–1.2)
Total Bilirubin: 0.6 mg/dL (ref 0.3–1.2)
Total Bilirubin: 0.6 mg/dL (ref 0.3–1.2)
Total Protein: 4.3 g/dL — ABNORMAL LOW (ref 6.0–8.3)
Total Protein: 5.7 g/dL — ABNORMAL LOW (ref 6.0–8.3)
Total Protein: <3 g/dL — ABNORMAL LOW (ref 6.0–8.3)

## 2011-03-16 LAB — CROSSMATCH
ABO/RH(D): A POS
Antibody Screen: NEGATIVE

## 2011-03-16 LAB — BLOOD GAS, VENOUS
Acid-base deficit: 6.1 mmol/L — ABNORMAL HIGH (ref 0.0–2.0)
Bicarbonate: 19.2 mEq/L — ABNORMAL LOW (ref 20.0–24.0)
Drawn by: 28678
FIO2: 0.35 %
MECHVT: 500 mL
O2 Saturation: 100 %
PEEP: 5 cmH2O
RATE: 12 resp/min
TCO2: 20.4 mmol/L (ref 0–100)
pCO2, Ven: 39.4 mmHg — ABNORMAL LOW (ref 45.0–50.0)
pH, Ven: 7.31 — ABNORMAL HIGH (ref 7.250–7.300)
pO2, Ven: 90.5 mmHg — ABNORMAL HIGH (ref 30.0–45.0)

## 2011-03-16 LAB — CULTURE, BLOOD (ROUTINE X 2)
Culture: NO GROWTH
Culture: NO GROWTH

## 2011-03-16 LAB — DIFFERENTIAL
Basophils Absolute: 0 10*3/uL (ref 0.0–0.1)
Basophils Absolute: 0 10*3/uL (ref 0.0–0.1)
Basophils Relative: 0 % (ref 0–1)
Basophils Relative: 0 % (ref 0–1)
Eosinophils Absolute: 0 10*3/uL (ref 0.0–0.7)
Eosinophils Absolute: 0.1 10*3/uL (ref 0.0–0.7)
Eosinophils Relative: 0 % (ref 0–5)
Eosinophils Relative: 1 % (ref 0–5)
Lymphocytes Relative: 11 % — ABNORMAL LOW (ref 12–46)
Lymphocytes Relative: 14 % (ref 12–46)
Lymphs Abs: 1.5 10*3/uL (ref 0.7–4.0)
Lymphs Abs: 1.8 10*3/uL (ref 0.7–4.0)
Monocytes Absolute: 0.6 10*3/uL (ref 0.1–1.0)
Monocytes Absolute: 0.8 10*3/uL (ref 0.1–1.0)
Monocytes Relative: 4 % (ref 3–12)
Monocytes Relative: 8 % (ref 3–12)
Neutro Abs: 13.8 10*3/uL — ABNORMAL HIGH (ref 1.7–7.7)
Neutro Abs: 8.6 10*3/uL — ABNORMAL HIGH (ref 1.7–7.7)
Neutrophils Relative %: 77 % (ref 43–77)
Neutrophils Relative %: 85 % — ABNORMAL HIGH (ref 43–77)

## 2011-03-16 LAB — DIC (DISSEMINATED INTRAVASCULAR COAGULATION) PANEL
D-Dimer, Quant: >20 ug/mL-FEU — ABNORMAL HIGH (ref 0.00–0.48)
INR: 1.4 (ref 0.00–1.49)
Platelets: 26 10*3/uL — CL (ref 150–400)
Prothrombin Time: 18.1 seconds — ABNORMAL HIGH (ref 11.6–15.2)

## 2011-03-16 LAB — URINE CULTURE
Colony Count: NO GROWTH
Culture: NO GROWTH
Special Requests: NEGATIVE

## 2011-03-16 LAB — MAGNESIUM: Magnesium: 1.1 mg/dL — ABNORMAL LOW (ref 1.5–2.5)

## 2011-03-16 LAB — URINE MICROSCOPIC-ADD ON

## 2011-03-16 LAB — D-DIMER, QUANTITATIVE (NOT AT ARMC): D-Dimer, Quant: 5.78 ug/mL-FEU — ABNORMAL HIGH (ref 0.00–0.48)

## 2011-03-16 LAB — CORTISOL: Cortisol, Plasma: 25.3 ug/dL

## 2011-03-16 LAB — APTT: aPTT: 41 seconds — ABNORMAL HIGH (ref 24–37)

## 2011-03-16 LAB — PROTIME-INR
INR: 1.7 — ABNORMAL HIGH (ref 0.00–1.49)
Prothrombin Time: 20.3 seconds — ABNORMAL HIGH (ref 11.6–15.2)

## 2011-03-16 LAB — DIC (DISSEMINATED INTRAVASCULAR COAGULATION)PANEL
Fibrinogen: 213 mg/dL (ref 204–475)
Smear Review: NONE SEEN
aPTT: 39 seconds — ABNORMAL HIGH (ref 24–37)

## 2011-03-16 LAB — PHOSPHORUS: Phosphorus: 3.9 mg/dL (ref 2.3–4.6)

## 2011-03-16 LAB — FIBRINOGEN: Fibrinogen: 194 mg/dL — ABNORMAL LOW (ref 204–475)

## 2011-03-16 LAB — RPR: RPR Ser Ql: NONREACTIVE

## 2011-03-17 LAB — COMPREHENSIVE METABOLIC PANEL
ALT: 12 U/L (ref 0–35)
AST: 18 U/L (ref 0–37)
Albumin: 2.5 g/dL — ABNORMAL LOW (ref 3.5–5.2)
Alkaline Phosphatase: 53 U/L (ref 39–117)
BUN: 7 mg/dL (ref 6–23)
CO2: 21 mEq/L (ref 19–32)
Calcium: 8.2 mg/dL — ABNORMAL LOW (ref 8.4–10.5)
Chloride: 104 mEq/L (ref 96–112)
Creatinine, Ser: 0.49 mg/dL (ref 0.4–1.2)
GFR calc Af Amer: >60 mL/min (ref >60)
GFR calc non Af Amer: >60 mL/min (ref >60)
Glucose, Bld: 118 mg/dL — ABNORMAL HIGH (ref 70–99)
Potassium: 3.8 mEq/L (ref 3.5–5.1)
Sodium: 133 mEq/L — ABNORMAL LOW (ref 135–145)
Total Bilirubin: 0.3 mg/dL (ref 0.3–1.2)
Total Protein: 5.7 g/dL — ABNORMAL LOW (ref 6.0–8.3)

## 2011-03-17 LAB — URINALYSIS, ROUTINE W REFLEX MICROSCOPIC
Bilirubin Urine: NEGATIVE
Glucose, UA: NEGATIVE mg/dL
Ketones, ur: NEGATIVE mg/dL
Nitrite: POSITIVE — AB
Protein, ur: 30 mg/dL — AB
Specific Gravity, Urine: 1.025 (ref 1.005–1.030)
Urobilinogen, UA: 0.2 mg/dL (ref 0.0–1.0)
pH: 6 (ref 5.0–8.0)

## 2011-03-17 LAB — URINE CULTURE
Colony Count: NO GROWTH
Culture: NO GROWTH

## 2011-03-17 LAB — CBC
HCT: 34.2 % — ABNORMAL LOW (ref 36.0–46.0)
Hemoglobin: 11.5 g/dL — ABNORMAL LOW (ref 12.0–15.0)
MCHC: 33.8 g/dL (ref 30.0–36.0)
MCV: 88.9 fL (ref 78.0–100.0)
Platelets: 109 10*3/uL — ABNORMAL LOW (ref 150–400)
RBC: 3.85 MIL/uL — ABNORMAL LOW (ref 3.87–5.11)
RDW: 13.4 % (ref 11.5–15.5)
WBC: 8.5 10*3/uL (ref 4.0–10.5)

## 2011-03-17 LAB — URINE MICROSCOPIC-ADD ON

## 2011-03-17 LAB — AMYLASE: Amylase: 116 U/L (ref 27–131)

## 2011-03-17 LAB — LIPASE, BLOOD: Lipase: 29 U/L (ref 11–59)

## 2011-03-21 LAB — URINE CULTURE: Colony Count: >=100000

## 2011-03-21 LAB — URINALYSIS, ROUTINE W REFLEX MICROSCOPIC
Bilirubin Urine: NEGATIVE
Glucose, UA: NEGATIVE mg/dL
Hgb urine dipstick: NEGATIVE
Ketones, ur: 15 mg/dL — AB
Nitrite: NEGATIVE
Protein, ur: NEGATIVE mg/dL
Specific Gravity, Urine: 1.028 (ref 1.005–1.030)
Urobilinogen, UA: 1 mg/dL (ref 0.0–1.0)
pH: 6.5 (ref 5.0–8.0)

## 2011-03-21 LAB — URINE MICROSCOPIC-ADD ON

## 2011-03-23 DIAGNOSIS — Q043 Other reduction deformities of brain: Secondary | ICD-10-CM

## 2011-03-23 DIAGNOSIS — R413 Other amnesia: Secondary | ICD-10-CM

## 2011-04-04 ENCOUNTER — Emergency Department (HOSPITAL_COMMUNITY)
Admission: EM | Admit: 2011-04-04 | Discharge: 2011-04-04 | Disposition: A | Discharge status: Home / Self Care | Payer: Self-pay | Attending: Emergency Medicine | Admitting: Emergency Medicine

## 2011-04-04 DIAGNOSIS — R209 Unspecified disturbances of skin sensation: Secondary | ICD-10-CM | POA: Insufficient documentation

## 2011-04-04 DIAGNOSIS — F29 Unspecified psychosis not due to a substance or known physiological condition: Secondary | ICD-10-CM | POA: Insufficient documentation

## 2011-04-04 DIAGNOSIS — F329 Major depressive disorder, single episode, unspecified: Secondary | ICD-10-CM | POA: Insufficient documentation

## 2011-04-04 DIAGNOSIS — Z79899 Other long term (current) drug therapy: Secondary | ICD-10-CM | POA: Insufficient documentation

## 2011-04-04 DIAGNOSIS — R55 Syncope and collapse: Secondary | ICD-10-CM | POA: Insufficient documentation

## 2011-04-04 DIAGNOSIS — F3289 Other specified depressive episodes: Secondary | ICD-10-CM | POA: Insufficient documentation

## 2011-04-04 DIAGNOSIS — R51 Headache: Secondary | ICD-10-CM | POA: Insufficient documentation

## 2011-04-04 LAB — POCT I-STAT, CHEM 8
BUN: 15 mg/dL (ref 6–23)
Calcium, Ion: 1.1 mmol/L — ABNORMAL LOW (ref 1.12–1.32)
Chloride: 102 mEq/L (ref 96–112)
Creatinine, Ser: 0.9 mg/dL (ref 0.4–1.2)
Glucose, Bld: 89 mg/dL (ref 70–99)
HCT: 36 % (ref 36.0–46.0)
Hemoglobin: 12.2 g/dL (ref 12.0–15.0)
Potassium: 3.6 mEq/L (ref 3.5–5.1)
Sodium: 140 mEq/L (ref 135–145)
TCO2: 27 mmol/L (ref 0–100)

## 2011-04-04 LAB — URINALYSIS, ROUTINE W REFLEX MICROSCOPIC
Bilirubin Urine: NEGATIVE
Glucose, UA: NEGATIVE mg/dL
Ketones, ur: NEGATIVE mg/dL
Nitrite: NEGATIVE
Protein, ur: NEGATIVE mg/dL
Specific Gravity, Urine: 1.02 (ref 1.005–1.030)
Urobilinogen, UA: 0.2 mg/dL (ref 0.0–1.0)
pH: 5.5 (ref 5.0–8.0)

## 2011-04-04 LAB — URINE MICROSCOPIC-ADD ON

## 2011-04-04 LAB — POCT PREGNANCY, URINE: Preg Test, Ur: NEGATIVE

## 2011-04-07 ENCOUNTER — Emergency Department (HOSPITAL_COMMUNITY)
Admission: EM | Admit: 2011-04-07 | Discharge: 2011-04-08 | Disposition: A | Discharge status: Home / Self Care | Payer: Medicaid Other | Attending: Emergency Medicine | Admitting: Emergency Medicine

## 2011-04-07 ENCOUNTER — Emergency Department (HOSPITAL_COMMUNITY): Payer: Medicaid Other

## 2011-04-07 DIAGNOSIS — Q048 Other specified congenital malformations of brain: Secondary | ICD-10-CM | POA: Insufficient documentation

## 2011-04-07 DIAGNOSIS — R51 Headache: Secondary | ICD-10-CM | POA: Insufficient documentation

## 2011-04-07 DIAGNOSIS — R209 Unspecified disturbances of skin sensation: Secondary | ICD-10-CM | POA: Insufficient documentation

## 2011-04-07 DIAGNOSIS — R29898 Other symptoms and signs involving the musculoskeletal system: Secondary | ICD-10-CM | POA: Insufficient documentation

## 2011-04-07 DIAGNOSIS — H53149 Visual discomfort, unspecified: Secondary | ICD-10-CM | POA: Insufficient documentation

## 2011-04-07 LAB — POCT I-STAT, CHEM 8
BUN: 16 mg/dL (ref 6–23)
Calcium, Ion: 1.1 mmol/L — ABNORMAL LOW (ref 1.12–1.32)
Chloride: 102 mEq/L (ref 96–112)
Creatinine, Ser: 0.8 mg/dL (ref 0.4–1.2)
Glucose, Bld: 103 mg/dL — ABNORMAL HIGH (ref 70–99)
HCT: 38 % (ref 36.0–46.0)
Hemoglobin: 12.9 g/dL (ref 12.0–15.0)
Potassium: 3.4 mEq/L — ABNORMAL LOW (ref 3.5–5.1)
Sodium: 139 mEq/L (ref 135–145)
TCO2: 27 mmol/L (ref 0–100)

## 2011-04-15 ENCOUNTER — Other Ambulatory Visit: Payer: Self-pay | Admitting: Neurology

## 2011-04-15 DIAGNOSIS — Q043 Other reduction deformities of brain: Secondary | ICD-10-CM

## 2011-04-15 DIAGNOSIS — R9409 Abnormal results of other function studies of central nervous system: Secondary | ICD-10-CM

## 2011-04-15 DIAGNOSIS — R51 Headache: Secondary | ICD-10-CM

## 2011-04-19 NOTE — Op Note (Signed)
NAME:  Samantha [name], Samantha [name]                 ACCOUNT NO.:  [id number]   MEDICAL RECORD NO.:  [id number]          PATIENT TYPE:  INP   LOCATION:  9372                          FACILITY:  [facility]   PHYSICIAN:  Ilda Mori, M.D.   DATE OF BIRTH:  08/27/1981   DATE OF PROCEDURE:  DATE OF DISCHARGE:                               OPERATIVE REPORT   PREOPERATIVE DIAGNOSES:  Brow presentation, possible abruption,  voluntary sterilization.   POSTOPERATIVE DIAGNOSES:  Brow presentation, possible abruption,  voluntary sterilization.   PROCEDURE:  1. Primary low transverse cesarean section.  2. Bilateral partial salpingectomy for sterilization.   SURGEON:  Ilda Mori, MD   ANESTHESIA:  Epidural.   ESTIMATED BLOOD LOSS:  1000 mL.   FINDINGS:  The amniotic cavity was filled with blood.  Female infant,  birth weight 6 pounds 9 ounces, Apgar scores 8 and 9.  Placenta and  portions of the right and left fallopian tubes were sent for  pathological evaluation.   INDICATIONS:  This is a 30 year old, gravida 3, para 1-0-1-1, female who  presented in active labor.  Even though this was her second baby, her  progress was quite slow and was felt to be due to poor labor.  Pitocin  was started, and the patient finally went from 8 cm to complete and  began pushing.  Evaluation after 15 minutes of pushing revealed that the  vertex was in an abnormal presentation and felt to be a brow  presentation.  In addition, small rim of cervix was palpable.  At this  time, also was noted a moderate amount of thick vaginal bleeding which  was consistent with a possible abruption.  Decision was therefore made  to proceed with cesarean section.   PROCEDURE:  The patient was taken to the operating room, and the  epidural anesthesia had been placed for analgesia for labor, was  injected for surgical anesthesia.  The abdomen was prepped and draped in  sterile fashion, and the bladder was catheterized.  A low transverse  incision was made and carried down to the fascia which was then extended  transversely.  The rectus muscle was then dissected free from the  overlying rectus sheath, and the midline was identified and opened and  extended bluntly.  The peritoneum was then entered bluntly and extended  bluntly.  The Alexis retractor was then placed, the lower segment  identified, and a Bandl ring appeared to be present.  Incision was made  over the lower segments which was then carried down to the amnion where  a large amount of blood was produced with the opening of the amniotic  cavity.  The infant was then delivered after repositioning from the brow  position and passed to the pediatrician.  Cord bloods were obtained.  The placenta was given for cord blood collection.  No definite abruption  could be identified, but the placental will be sent to Pathology.  The  lower segment was then closed with a single layer of running  interlocking Vicryl 1 suture.  Hemostasis was obtained at the left angle  with a  figure-of-eight suture.  Attention was turned to the left  fallopian tube which was grasped with a Babcock clamp and elevated.  A  knuckle of tube was excised after being doubly ligated with plain catgut  suture at the base.  An identical procedure was then carried out on the  right fallopian tube.  The Alexis retractor was then  removed.  The peritoneum and rectus muscle was closed in the midline  with running 3-0 Vicryl suture.  The fascia was then closed with running  0 Vicryl suture, and the skin was closed with staples.  The patient  tolerated the procedure well and left the operating room in good  condition.      Ilda Mori, M.D.  Electronically Signed     RK/MEDQ  D:  03/24/2009  T:  03/25/2009  Job:  [id number]

## 2011-04-19 NOTE — Discharge Summary (Signed)
NAME:  Samantha [name], Samantha [name]                 ACCOUNT NO.:  [id number]   MEDICAL RECORD NO.:  [id number]          PATIENT TYPE:  INP   LOCATION:  3017                         FACILITY:  [facility]   PHYSICIAN:  Pramod P. Pearlean Brownie, MD    DATE OF BIRTH:  [birth year]   DATE OF ADMISSION:  07/11/2009  DATE OF DISCHARGE:  07/14/2009                               DISCHARGE SUMMARY   DISCHARGE DIAGNOSES:  1. Chronic daily headaches with left face and hand tingling secondary      to corpus callosal agenesis.  2. Depression.  3. Genital herpes.  4. Kidney stones.  5. Mitral valve prolapse.  6. History of aortic valve insufficiency.   DISCHARGE MEDICATIONS:  1. Topamax 25 mg daily x7 days, then increase to b.i.d.  2. Ultram 100 mg p.o. q.6 h. p.r.n. headache.   STUDIES PERFORMED:  1. CT angio of the neck on admission shows no acute stenosis in the      neck.  Variant anatomy of the arch origin of the left vertebral      artery was slightly higher entry into the foramen transversing at      C5.  2. CT angio of the head shows abnormal early enhancement of the left      cavernous sinus and raises concern for cavernous carotid fistula.      Partial agenesis of corpus callosum with asymmetric dilatation of      the left lateral ventricle likely congenital.  Dilated left      sphenoparietal sinus.  No acute parenchymal abnormalities.  3. Cerebral angiogram performed by Dr. Corliss Skains shows no evidence of      carotid cavernous fistula.  Venous outflow within normal limits.      There is hypervascularity in the left nasal septal region, probably      an inflammatory reaction.  4. MRI of the brain shows no acute stroke.  There is a callosal      agenesis, more severe on the left than the right with probable      interhemispheric adhesions or cysts resulting in asymmetric      enlargement of left greater than right lateral ventricle.  5. A 2-D echocardiogram shows EF that was normal.  No obvious source      of  embolus.  Carotid Doppler not performed.  EKG shows sinus      rhythm.   LABORATORY STUDIES:  Cholesterol 157, triglycerides 48, HDL 61, and LDL  86.  Hemoglobin A1c 5.2.  Cardiac enzymes negative.  Chemistry with  glucose 213, sodium 133, otherwise normal.  Coagulation studies normal.  Urinalysis with many bacteria but no leukocyte esterase.  Alcohol level  less than 5.  Urine drug screen negative.  CBC normal.  Urine pregnancy  negative.   HISTORY OF PRESENT ILLNESS:  Samantha [name] is a 30 year old right-  handed Caucasian female who is status post C-section 3 months ago  complicated by severe bleeding requiring transfusion.  The patient  underwent a tubal ligation at the same time.  She has been complaining  of headache since discharge from  the hospital.  Headache had been  particularly bad over the past 1 month.  Pain is always on the left  side.  Over the past couple of weeks, she has been experiencing  recurrent episodes of numbness involving the left side of her face and  left hand intermittently associated with severe headache.  She has  episodes of nausea with these severe headaches.  She has not been on  antiplatelet therapy.  She has no history of stroke or TIA.  CT of the  brain done in the emergency room as well as arteriogram of the head and  neck from the emergency room showed possible left carotid cavernous  fistula.  There was no acute stroke noted on the CT.  She was admitted  to the hospital for further stroke evaluation.   HOSPITAL COURSE:  MRI was negative for acute stroke.  Cerebral angiogram  was done to rule out cavernous carotid fistula.  She does have corpus  callosal agenesis which is likely the etiology of her headaches.  She  was placed on Topamax 25 mg with goal b.i.d. for headache prevention as  well as Ultram for breakthrough pain.  The patient is a highly  functional female with children at home and plans to return home to care  for them.  She has no  PT/OT needs.   CONDITION ON DISCHARGE:  The patient is alert and oriented x3.  No  aphasia.  No dysarthria.  Eye movements are full.  Face is symmetric.  She moves all four extremities without difficulty.  She has no focal  weakness and no ataxia.   DISCHARGE PLAN:  1. Discharge home with family.  2. New Topamax and Ultram for breakthrough pain.  3. Follow up primary care physician as needed.  4. Follow up Dr. Levert Feinstein neurologist for headaches as needed.      Annie Main, N.P.    ______________________________  Sunny Schlein. Pearlean Brownie, MD    SB/MEDQ  D:  07/14/2009  T:  07/14/2009  Job:  [id number]   cc:   Levert Feinstein, MD

## 2011-04-19 NOTE — Assessment & Plan Note (Signed)
[facility]                            CARDIOLOGY OFFICE NOTE   NAME:[name], Samantha FEDIE                        MRN:          [id number]  DATE:09/04/2008                            DOB:          Apr 18, 1981    IDENTIFICATION:  Ms. Brossard is a 30 year old woman who I followed in the  past.  I last saw her actually in 2004.  She was previously followed by  Lorna Few.  She has a history of a possible Marfan's versus Beal  syndrome, noted to have mild-to-moderate aortic root dilatation when she  was growing up, possible bicuspid valve.  An echocardiogram that was  done in 2004, showed normal aorta.  The aortic valve appeared to be  trileaflet, but there was mild insufficiency.   She comes in today for continued followup.  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  She has had  some baseline EKG changes.   In the interval since I have seen her, she has had one child already,  and she is now pregnant [redacted] weeks with a second pregnancy.  First  pregnancy went without a problem.  She denied shortness of breath, and  no palpitations, no dizziness, no chest pressure.   CURRENT MEDICATIONS:  Prenatal vitamins.   PHYSICAL EXAMINATION:  GENERAL:  The patient is in no distress.  VITAL SIGNS:  Blood pressure is 110/73, pulse 74 and regular, weight  162.  NECK:  JVP is normal.  LUNGS:  Clear without rales or wheezes.  CARDIAC:  Regular rate and rhythm, S1 and S2, no S3, no murmurs even  with changes in position.  ABDOMEN:  Benign.  No hepatomegaly.  EXTREMITIES:  Good distal pulses.  No edema.   A 12-lead EKG shows a normal sinus rhythm, 79 beats per minute.  T-wave  inversion in V3, V4 unchanged and also lead III unchanged.   IMPRESSION:  Ms. Caicedo has been followed in the past for her aortic size,  as well as valvular issues.  Her last echo showed mild aortic  insufficiency.  I do not hear it on exam today, but I think  she should  have an echocardiogram especially since she has been through her  pregnancy to reevaluate her root size and the aortic valve.  Echo did  not show any evidence of mitral valve prolapse, and again I did not hear  any on exam today, but we will reevaluate.   Otherwise, activities as tolerated.  I think she should do okay from her  pregnancy standpoint.  Drink ample fluids.  I will be in touch with the  patient regarding the test results and otherwise set to see her back in  2-3 years' time.     Pricilla Riffle, MD, [facility]  Electronically Signed    PVR/MedQ  DD: 09/04/2008  DT: 09/05/2008  Job #: [id number]   cc:   Freddrick March. Tenny Craw, MD

## 2011-04-19 NOTE — Letter (Signed)
September 09, 2008    To Whom It May Concern,  (customer number [phone])   RE:  SAMELLA, LUCCHETTI  MRN:  [id number]  /  DOB:  29-Nov-1981   Ms. Swiney is a patient I have followed in cardiology clinic.  I just saw  her on October 01, of this year.   My findings for Ms. Capetillo is that she has only mild aortic valve  insufficiency, otherwise normal LV function.  I did not find any other  significant abnormalities.  She is active without any limitations from  this.  I do not expect that this should become a significant problem  down the road, but I will continue to follow her for changes.   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  Again, her  valvular abnormality is not that uncommon with many people having this  degree of insufficiency, who are not considered to have any limitation.   If you have any questions, please feel free to contact me.    Sincerely,      Pricilla Riffle, MD, [facility]  Electronically Signed    PVR/MedQ  DD: 09/10/2008  DT: 09/10/2008  Job #: [id number]

## 2011-04-19 NOTE — Op Note (Signed)
NAME:  Samantha [name], Samantha [name]                 ACCOUNT NO.:  [id number]   MEDICAL RECORD NO.:  [id number]          PATIENT TYPE:  INP   LOCATION:  9372                          FACILITY:  [facility]   PHYSICIAN:  Ilda Mori, M.D.   DATE OF BIRTH:  1981/07/26   DATE OF PROCEDURE:  03/24/2009  DATE OF DISCHARGE:                               OPERATIVE REPORT   PREOPERATIVE DIAGNOSIS:  Postoperative intraperitoneal hemorrhage.   POSTOPERATIVE DIAGNOSIS:  Bleeding from right mesosalpinx.   PROCEDURE:  Exploratory laparotomy, right fimbriectomy and suture, right  mesosalpinx.   SURGEON:  Ilda Mori, MD   ASSISTANT:  Lesly Dukes, MD   ANESTHESIA:  General endotracheal.   FINDINGS:  There was 1200-1500 mls of blood in the peritoneal cavity.  There was retraction of the proximal portion of the right tube from the  ligation site with active bleeding from the mesosalpinx.  The uterus was  intact.  No areas of rupture or bleeding were noted from the uterus.  The lower segment incision was intact with no bleeding from there.   INDICATIONS:  This is a 30 year old female who is approximately 4-5  hours of postop primary low transverse cesarean section and bilateral  tubal ligation for sterilization.  Approximately 3 hours after surgery  was completed, the patient was noted to be lethargic with tachycardia  and hypotension.  She was resuscitated with fluids and seemed to  stabilize, however, approximately between 1 and 1:30 a.m. on 04/21, she  had a hypoxemic episode with seizure and decreased responsiveness.  A  code was called and the nurse anesthatist and the aesthesiologist began  resuscitation.  Hemoglobin done at approximately 11:30 p.m. on 04/20,  during her first episode was 9 gms.  Her repeat hemoglobin was 4.7gms  and this confirmed the suspicion of post operative intraperitoneal  hemorrhage.   PROCEDURE:  The patient was brought to the operating room and placed in  supine position in  Trendelenburg.  General endotracheal anesthesia was  performed.  The abdomen was prepped and draped in sterile fashion.  The  skin staples from the earlier laparotomy were removed.  The fascial  stitches were opened and the fascia was opened, and the sutures in the  rectus muscle and peritoneal cavity were opened, and the peritoneum was  entered.  The uterus was delivered and blood and clots were aspirated.  A self-retaining retractor was then placed and the pelvis and uterus and  ovaries were carefully inspected, and bleeding was noted coming from the  right mesosalpinx with the proximal portion of the tube having torn away  from the ligation site.  The mesosalpinx was closed with a running 2-0  interrupted Vicryl suture.  The fimbriated end of the tube was felt to  be avulsed and therefore a fimbriectomy was performed and the base was  ligated with 2-0 tie.  Hemostasis was noted to be present at this point.  Attention was turned to the left tubal ligation site which was still  intact and dry.  The lower segment was reexamined and this incision was  dry.  Uterus  was somewhat boggy, but excessive vaginal bleeding was not  noted.  A total of over 1200 mL of blood and clots was aspirated from  the abdomenal cavity.  After hemostasis was noted to be present, the  procedure was terminated.  The sponge counts were correct x2.  The  peritoneum and rectus muscle was closed with running 3-0 Vicryl suture.  The fascia was closed with running 0 Vicryl suture.  The skin was closed  with staples.  An NG tube was placed in anticipation of a possible ileus from the  marked hemoperitoneum.  The patient tolerated the procedure well and  left the operating room in good condition.  She received a total of 2  units of O negative blood, 4 units of A positive packed RBCs, and 3  units of fresh frozen plasma.      Ilda Mori, M.D.  Electronically Signed     RK/MEDQ  D:  03/25/2009  T:  03/25/2009   Job:  [id number]

## 2011-04-19 NOTE — H&P (Signed)
NAME:  Samantha [name], Samantha [name]                 ACCOUNT NO.:  [id number]   MEDICAL RECORD NO.:  [id number]          PATIENT TYPE:  INP   LOCATION:  3017                         FACILITY:  [facility]   PHYSICIAN:  Noel Christmas, MD    DATE OF BIRTH:  Feb 16, 1981   DATE OF ADMISSION:  07/11/2009  DATE OF DISCHARGE:                              HISTORY & PHYSICAL   CHIEF COMPLAINT:  Daily headaches for about 3 months, worse over the  past 1 month along with numbness involving the left hand and face  intermittently.   HISTORY OF PRESENT ILLNESS:  This is a 30 year old lady who is status  post C-section about 3 months ago which was complicated by severe  bleeding requiring transfusion.  The patient underwent tubal ligation at  the same time.  She has been complaining of headaches since discharge  from the hospital.  Headaches have been particularly bad over the past 1  month.  Pain is always on the left side.  Over the past couple of weeks,  she has been experiencing recurrent episodes of numbness involving left  side of her face and left hand intermittently associated with severe  headaches.  He has also been episodes of nausea with severe headaches.  Last occurrence of sensory changes was today.  The patient has not been  on antiplatelet therapy.  Previous history stroke or TIA.  CT scan as  well as followup CT arteriogram of the head showed findings indicative  of probable left carotid - cavernous fistula.  CTA of the neck was  unremarkable.  There was no evidence of an acute stroke on CT.   PAST MEDICAL HISTORY:  Remarkable for:  1. Depression.  2. Genital herpes.  3. Kidney stones.  4. Mitral valve prolapse.  5. History of aortic valve insufficiency.   CURRENT MEDICATIONS:  None.   ALLERGIES:  No known drug allergy.   FAMILY HISTORY:  Strong for coronary artery disease and myocardial  infarctions on her maternal side of the family.  Family history is also  positive for diabetes mellitus.   Family history is negative for stroke.   SOCIAL HISTORY:  The patient is divorced.  She has two children.  She  has no history of tobacco abuse, nor use of alcohol or illicit drugs.   REVIEW OF SYSTEMS:  Negative except for presenting symptoms as described  above.   PHYSICAL EXAMINATION:  VITAL SIGNS:  Temperature was 98.1, blood  pressure 112/77, pulse 74 per minute, respirations were 18 per minute.  GENERAL:  Appearance was that of a moderately obese young lady who is  alert and cooperative in no acute distress.  Mental status was normal.  HEENT:  Head, eyes, ears, nose, and throat were normal.  NECK:  Supple.  There was no tenderness, nor palpable masses.  LUNGS:  Clear to auscultation.  CARDIAC:  Normal.  ABDOMEN:  Soft with no tenderness.  Bowel sounds were normal.  SKIN AND MUCOSA:  Normal.  EXTREMITIES:  Normal including peripheral pulses.  GENITOURINARY:  Evaluation of genitalia and rectum is deferred.  NEUROLOGIC:  Normal.  Pupils, extraocular movements, and visual fields  were normal.  There was no facial numbness.  No facial weakness.  Hearing was normal.  Speech and palatal movement were normal.  Strength  and muscle tone were normal throughout.  Deep tendon reflexes were  normal and symmetrical.  Plantar responses were flexor.  Sensory  examination was normal.  Carotid auscultation was normal.   LABORATORY STUDIES:  WBC count was 6.1, hemoglobin 13.3, hematocrit 39.8  and platelet count 162,000.  Serum sodium was 139, potassium 3.9,  chloride 101, CO2 of 27, BUN 16, creatinine 0.6, glucose was 64, and  ionized calcium was 1.18.  PT and PTT were pending.   CLINICAL IMPRESSION:  1. 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  2. Recurrent headaches, most likely secondary to #1.  3. Recurrent focal sensory changes involving the left face and left      hand of unclear etiology, particularly with respect to probable      left carotid - cavernous fistula.  Right subcortical  transient      ischemic attacks cannot be ruled out.   PLAN:  1. Catheter angiogram in the a.m.  2. Probable interventional radiology procedure for repair of carotid      cavernous fistula later during the week.  3. Echocardiogram.  4. MRI of the brain without contrast.      Noel Christmas, MD  Electronically Signed     CS/MEDQ  D:  07/11/2009  T:  07/12/2009  Job:  [id number]

## 2011-04-19 NOTE — Discharge Summary (Signed)
NAME:  Samantha [name], Samantha [name]                 ACCOUNT NO.:  [id number]   MEDICAL RECORD NO.:  [id number]          PATIENT TYPE:  OBV   LOCATION:  9306                          FACILITY:  [facility]   PHYSICIAN:  Gerrit Friends. Aldona Bar, M.D.   DATE OF BIRTH:  [birth year]   DATE OF ADMISSION:  04/02/2009  DATE OF DISCHARGE:  04/04/2009                               DISCHARGE SUMMARY   DISCHARGE DIAGNOSES:  Worsening abdominal pain status post cesarean  section and reoperation for intra-abdominal hemorrhage.   SUMMARY:  This 30 year old gravida 2, now para 2, was admitted on  postpartum day #9 after a cesarean section and reexploration for intra-  abdominal hemorrhage - she was admitted for severe abdominal pain.  She  was seen by Dr. Henderson Cloud and had extensive workup which included a  pulmonary scan to rule out an embolus.  She was ultimately admitted and  placed on IV pain medication as well as intravenously Unasyn with  suspicion that she might have a postpartum endometritis, however, her  fever was never elevated and her white count and differential always  normal.  She was observed with her admitting orders as they were written  for approximately 36 hours, and on the morning of Apr 04, 2009, was  feeling well, having no fever - there was no fever during any time that  she was in the hospital and her CBC was stable - hemoglobin 10.9, white  count 10,400 and differential was totally normal.  Her abdomen was  negative for discomfort on the morning of Apr 04, 2009, and accordingly  she was discharged home.  She was not given any antibiotics to continue.  She will return to the office as scheduled for followup this coming  week.  She was given prescriptions for Tylox to use 1-2 every 4-6 hours  as needed for severe pain and Motrin 600 mg every 6 hours for less  severe pain or cramping.  She was given careful explicit instructions at  the time of discharge.   CONDITION ON DISCHARGE:  Improved.  As mentioned, she  will be seen in  the office within the next 7 days for followup or sooner if necessary.      Gerrit Friends. Aldona Bar, M.D.  Electronically Signed     RMW/MEDQ  D:  04/04/2009  T:  04/04/2009  Job:  [id number]

## 2011-04-22 NOTE — Discharge Summary (Signed)
NAME:  Samantha [name], Samantha [name]                 ACCOUNT NO.:  [id number]   MEDICAL RECORD NO.:  [id number]          PATIENT TYPE:  OBV   LOCATION:  9399                          FACILITY:  [facility]   PHYSICIAN:  Ilda Mori, M.D.   DATE OF BIRTH:  Nov 06, 1981   DATE OF ADMISSION:  10/14/2008  DATE OF DISCHARGE:  10/15/2008                               DISCHARGE SUMMARY   FINAL DIAGNOSES:  1. Intrauterine pregnancy at 16 weeks' gestation.  2. Questionable preterm premature rupture of membranes.  3. Herpes II virus.   COMPLICATIONS:  None.   HOSPITAL COURSE:  This 30 year old G4, P 1-0-2-1 presents at about 34  weeks' gestation complaining of leaking fluid from her vagina.  She has  been noticing this for a couple of days and is concerned.  Otherwise,  the patient's antepartum course up to this point had been uncomplicated.  She does have a history of depression for which she is not on any  medications for during this pregnancy and a history of some cardiac  aortic insufficiency, for which she has seen a cardiologist for.  Upon  admission to the hospital by Dr. Waynard Reeds, a large flow of yellow  fluid was noted.  The fern was negative.  On vaginal ultrasound, cervix  was still 4.5 cm.  A herpes culture was obtained as well as lab work and  a Museum/gallery exhibitions officer Medicine consult.  An ultrasound was performed, which  showed a normal amniotic fluid volume, which did not change over the  course of her hospital stay.  On a repeat speculum exam, her cervix now  looked inflamed with persistent flow of exudate in the vaginal fornix  and there was herpetic lesions noticed on the vulva.  A diagnosis of HSV  was made.  No evidence of PPROM by ultrasound.  The patient was started  on Valtrex 1 g b.i.d. for the next 10 days.  She is to follow up in our  office in 1 week and of course to call with any change in her status.   LABORATORY DATA:  On discharge, the patient had a hemoglobin of 11.9,  white blood cell  count of 3.8, platelets of 90,000.  The patient also  had a culture that returned with positive herpes II virus detected.      Leilani Able, P.A.-C.      Ilda Mori, M.D.  Electronically Signed    MB/MEDQ  D:  11/17/2008  T:  11/18/2008  Job:  [id number]

## 2011-04-22 NOTE — Op Note (Signed)
NAME:  Samantha [name], Samantha [name]                 ACCOUNT NO.:  [id number]   MEDICAL RECORD NO.:  [id number]          PATIENT TYPE:  AMB   LOCATION:  SDC                           FACILITY:  [facility]   PHYSICIAN:  Roseanna Rainbow, M.D.DATE OF BIRTH:  Oct 31, 1981   DATE OF PROCEDURE:  03/31/2005  DATE OF DISCHARGE:                                 OPERATIVE REPORT   PREOPERATIVE DIAGNOSIS:  Missed abortion.   POSTOPERATIVE DIAGNOSIS:  Missed abortion.   PROCEDURE:  Suction dilatation and curettage.   SURGEON:  Roseanna Rainbow, M.D.   ANESTHESIA:  Managed anesthesia care, paracervical block.   IV FLUIDS:  As per anesthesiology.   URINE OUTPUT:  100 mL clear urine at the beginning of the procedure.   ESTIMATED BLOOD LOSS:  Approximately 100 mL.   PROCEDURE:  The patient was taken to the operating room with an IV running.  She was then placed in the dorsal lithotomy position and prepped and draped  in the usual sterile fashion. A bivalve speculum was placed in the patient's  vagina. The anterior lip of the cervix was infiltrated with 2 mL of 2%  lidocaine. The single-tooth tenaculum was then applied to this location.  Then, 4 mL of 2% lidocaine were placed at five and seven o'clock to produce  a paracervical block. The cervix was then dilated with [facility] dilators. A 7  mm suction curette was then introduced into the uterus. The curette was  rotated and activated to evacuate the products of conception. Sharp  curettage was then performed, a gritty texture was noted. A final pass was  then made with the suction curette. The single-tooth tenaculum was removed  from the cervix with minimal bleeding noted. And at the close of the  procedure the instrument and pad counts were said to be correct x2. The  patient was taken to the PACU awake and in stable condition.      LAJ/MEDQ  D:  03/31/2005  T:  03/31/2005  Job:  [id number]
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
## 2011-04-22 NOTE — Discharge Summary (Signed)
NAME:  Samantha [name], Samantha [name]                 ACCOUNT NO.:  [id number]   MEDICAL RECORD NO.:  [id number]          PATIENT TYPE:  OBV   LOCATION:  9158                          [facility]ACILITY:  [facility]   PHYSICIAN:  Carrington Clamp, M.D. DATE OF BIRTH:  [birth year]   DATE OF ADMISSION:  02/11/2009  DATE OF DISCHARGE:  02/12/2009                               DISCHARGE SUMMARY   FINAL DIAGNOSES:  1. Intrauterine pregnancy at 32 weeks' gestation.  2. Urinary tract infection with left flank pain and thrombocytopenia.   COMPLICATIONS:  None.   This 30 year old G3, P0-0-1-1 presents at 5 weeks' gestation with the  onset of left flank pain.  The patient denied any contractions,  cramping, or vaginal bleeding upon admission.  Cervix was soft, but  internal os was still closed, and there was no CVA tenderness.  The  patient's urinalysis did show positive nitrates and wbc's.  The culture  is there with no growth.  The patient was kept in observation for 24  hours to see if her pain resolved after some morphine after it has worn  off.  She was started on Macrobid.  Her white blood cell count was  within normal limits.  She had a renal ultrasound that was normal.  By  hospital day #1, the patient was felt ready for discharge.  For this  urinary tract infection, she did receive one dose of Ancef.  She was  sent home with Ceftin 500 mg to take one b.i.d.  For the rest of the  prescription to follow up in our office in 1 week and of course to call  with any increase in her pain or problems.   LABORATORY DATA ON DISCHARGE:  The patient had a hemoglobin of a 11.5,  white blood cell count of 8.5, and platelets of 109,000.      Leilani Able, P.A.-C.      Carrington Clamp, M.D.  Electronically Signed    MB/MEDQ  D:  02/25/2009  T:  02/26/2009  Job:  [id number]

## 2011-04-22 NOTE — Op Note (Signed)
NAME:  Samantha [name], Samantha [name]                 ACCOUNT NO.:  [id number]   MEDICAL RECORD NO.:  [id number]          PATIENT TYPE:  AMB   LOCATION:  NESC                         FACILITY:  [facility]   PHYSICIAN:  Boston Service, M.D.DATE OF BIRTH:  06/12/1981   DATE OF PROCEDURE:  08/29/2006  DATE OF DISCHARGE:  08/29/2006                                 OPERATIVE REPORT   LMD:  Juline Patch, M.D.   PREOPERATIVE DIAGNOSIS:  A 5 mm right distal ureteral stone identified on CT  scan August 23, 2006, KUB August 24, 2006.  The stone was not well-  seen.  Question component of uric acid.   POSTOPERATIVE DIAGNOSIS:  A 5 mm right distal ureteral stone identified on  CT scan August 23, 2006, KUB August 24, 2006.  The stone was not well-  seen.  Question component of uric acid.   PROCEDURES:  Cystoscopy, retrograde ureteroscopy, stone manipulation.   SURGEON:  Boston Service, M.D.   ASSISTANTS:  None.   ANESTHESIA:  General.   SPECIMENS:  A multitude of small fragments, sand, distal ureter.   ESTIMATED BLOOD LOSS:  Minimal.   COMPLICATIONS:  None obvious.   INDICATIONS:  A 30 year old female, prior history of urolithiasis 2004,  passed several small stones while in the emergency room.  Seen recently  August 23, 2006, [facility] ER.  CT scan:  A 5 mm right distal ureteral  stone.  The patient has had intermittent symptoms since then, stone not well-  seen on KUB.  Plans made for cystoscopy, retrogrades and stone manipulation.   DESCRIPTION OF PROCEDURE:  The patient was prepped and draped in the dorsal  lithotomy position after institution of an adequate level of general  anesthesia.  A well-lubricated 21-French panendoscope was gently inserted at  the urethral meatus, normal urethra and sphincter.  The patient had bullous  edema surrounding the right ureteral orifice.  Normal urothelium at the left  ureteral orifice.   Retrograde catheter was selected, inserted at the left  ureteral orifice.  Ureter showed no filling defect or obstruction, fine and delicate, with  prompt drainage at 3-5 minutes.  On the right side there appeared to be a  collection of very small calculi within the distal ureter with mild proximal  hydronephrosis.  Retrograde catheter was removed, end-hole catheter inserted  into the right ureteral orifice with passage of the guidewire.  There  appeared to be innumerable small fragments exiting from the right ureteral  orifice with what appeared to be a prompt postobstructive diuresis.  This  was allowed to subside.  A Floppy-tip guidewire was then inserted into the  upper pole calyces.  A 6-French ureteroscope was then inserted alongside the  guidewire.  Area of erythema was identified within the distal ureter.  No  large or solitary stones were identified.  Innumerable small calculi, many  of them less than a millimeter, were passed out.  Several of them went  through the strainer.  The ureteroscope was then reinserted to the limit of  the short 6-French scope to a spot just below the UPJ.  No  other stony  fragments or ureteral pathology identified.  Ureteroscope was then carefully  withdrawn and no additional stony fragments were identified within the  ureter.  Guidewire was then withdrawn.  Bladder was carefully inspected.  There was prompt efflux of pink urine from the right ureteral orifice  without any evidence of obstruction.  Bladder was drained.  The patient was  given a B&O suppository as well as 30 mg of Toradol and then returned to  recovery in satisfactory condition.           ______________________________  Boston Service, M.D.     RH/MEDQ  D:  08/29/2006  T:  08/31/2006  Job:  [id number]   cc:   Juline Patch, M.D.  Fax: [phone]

## 2011-04-22 NOTE — H&P (Signed)
NAME:  Samantha [name], Samantha [name]                 ACCOUNT NO.:  [id number]   MEDICAL RECORD NO.:  [id number]          PATIENT TYPE:  AMB   LOCATION:  SDC                           FACILITY:  [facility]   PHYSICIAN:  Roseanna Rainbow, M.D.DATE OF BIRTH:  Jun 19, 1981   DATE OF ADMISSION:  DATE OF DISCHARGE:                                HISTORY & PHYSICAL   CHIEF COMPLAINT:  The patient is a 30 year old with an early pregnancy  failure, blighted ovum, who presents for suction D&C.   HISTORY OF PRESENT ILLNESS:  Please see the above. The patient had an  ultrasound performed on March 21, 2005 that was consistent with a blighted  ovum.   PAST OBSTETRICAL AND GYNECOLOGICAL HISTORY:  Remarkable for menorrhagia. She  has no history of any previous pregnancies.   PAST MEDICAL HISTORY:  Mitral valve prolapse.   PAST SURGICAL HISTORY:  She denies.   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  Marital history:  She is married, living with her spouse. Alcohol:  She does  not give any significant history of alcohol usage. Tobacco use:  She has no  significant smoking history. Illicit drug use:  Denies illicit drug use.   GENERAL FAMILY ILLNESS:  Cervical cancer, breast cancer, adult-onset  diabetes mellitus, hypertension.   ALLERGIES:  PENICILLIN.   MEDICATIONS:  None.   PHYSICAL EXAMINATION:  VITAL SIGNS:  Stable, afebrile.  GENERAL:  Well-developed, well-nourished individual in no acute distress.  NECK:  No thyroid enlargement, tenderness, or mass.  RESPIRATORY:  Normal respiratory effort.  LUNGS:  Clear to auscultation.  HEART:  Regular rate and rhythm.  ABDOMEN:  Soft, nontender, no organomegaly.  PELVIC:  Normal EG/BUS. Speculum exam:  There is white discharge. The cervix  is clear, firm, and closed, no visible lesions. On bimanual exam the uterus  is slightly enlarged, nontender. The adnexa are nonpalpable, nontender.   ASSESSMENT:  Early pregnancy failure,  blighted ovum. The patient with a  history of mitral valve prolapse.   PLAN:  The planned procedure is suction D&C. Will prophylax for SBE. The  risks, benefits and alternative forms of management were reviewed with the  patient and informed consent had been obtained.      LAJ/MEDQ  D:  03/30/2005  T:  03/30/2005  Job:  [id number]

## 2011-05-06 ENCOUNTER — Emergency Department (HOSPITAL_COMMUNITY)
Admission: EM | Admit: 2011-05-06 | Discharge: 2011-05-07 | Disposition: A | Discharge status: Home / Self Care | Payer: Medicaid Other | Attending: Emergency Medicine | Admitting: Emergency Medicine

## 2011-05-06 DIAGNOSIS — R51 Headache: Secondary | ICD-10-CM | POA: Insufficient documentation

## 2011-05-06 DIAGNOSIS — R55 Syncope and collapse: Secondary | ICD-10-CM | POA: Insufficient documentation

## 2011-05-06 DIAGNOSIS — I498 Other specified cardiac arrhythmias: Secondary | ICD-10-CM | POA: Insufficient documentation

## 2011-05-06 DIAGNOSIS — R404 Transient alteration of awareness: Secondary | ICD-10-CM | POA: Insufficient documentation

## 2011-05-06 LAB — POCT PREGNANCY, URINE: Preg Test, Ur: NEGATIVE

## 2011-05-07 ENCOUNTER — Emergency Department (HOSPITAL_COMMUNITY): Payer: Medicaid Other

## 2011-05-07 LAB — CK TOTAL AND CKMB (NOT AT ARMC)
CK, MB: 1.7 ng/mL (ref 0.3–4.0)
Relative Index: INVALID (ref 0.0–2.5)
Total CK: 52 U/L (ref 7–177)

## 2011-05-07 LAB — URINALYSIS, ROUTINE W REFLEX MICROSCOPIC
Bilirubin Urine: NEGATIVE
Glucose, UA: NEGATIVE mg/dL
Hgb urine dipstick: NEGATIVE
Ketones, ur: NEGATIVE mg/dL
Nitrite: NEGATIVE
Protein, ur: NEGATIVE mg/dL
Specific Gravity, Urine: 1.019 (ref 1.005–1.030)
Urobilinogen, UA: 1 mg/dL (ref 0.0–1.0)
pH: 7 (ref 5.0–8.0)

## 2011-05-07 LAB — CBC
HCT: 34.8 % — ABNORMAL LOW (ref 36.0–46.0)
Hemoglobin: 11.6 g/dL — ABNORMAL LOW (ref 12.0–15.0)
MCH: 27 pg (ref 26.0–34.0)
MCHC: 33.3 g/dL (ref 30.0–36.0)
MCV: 80.9 fL (ref 78.0–100.0)
Platelets: 154 10*3/uL (ref 150–400)
RBC: 4.3 MIL/uL (ref 3.87–5.11)
RDW: 13.3 % (ref 11.5–15.5)
WBC: 6.1 10*3/uL (ref 4.0–10.5)

## 2011-05-07 LAB — URINE MICROSCOPIC-ADD ON

## 2011-05-07 LAB — TROPONIN I: Troponin I: <0.3 ng/mL (ref <0.30)

## 2011-05-07 LAB — BASIC METABOLIC PANEL
BUN: 14 mg/dL (ref 6–23)
CO2: 26 mEq/L (ref 19–32)
Calcium: 8.7 mg/dL (ref 8.4–10.5)
Chloride: 106 mEq/L (ref 96–112)
Creatinine, Ser: 0.63 mg/dL (ref 0.4–1.2)
GFR calc Af Amer: >60 mL/min (ref >60)
GFR calc non Af Amer: >60 mL/min (ref >60)
Glucose, Bld: 107 mg/dL — ABNORMAL HIGH (ref 70–99)
Potassium: 3.4 mEq/L — ABNORMAL LOW (ref 3.5–5.1)
Sodium: 140 mEq/L (ref 135–145)

## 2011-05-08 ENCOUNTER — Emergency Department (HOSPITAL_COMMUNITY)
Admission: EM | Admit: 2011-05-08 | Discharge: 2011-05-09 | Disposition: A | Discharge status: Home / Self Care | Payer: Medicaid Other | Attending: Emergency Medicine | Admitting: Emergency Medicine

## 2011-05-08 DIAGNOSIS — R51 Headache: Secondary | ICD-10-CM | POA: Insufficient documentation

## 2011-05-08 DIAGNOSIS — Z79899 Other long term (current) drug therapy: Secondary | ICD-10-CM | POA: Insufficient documentation

## 2011-05-08 DIAGNOSIS — R55 Syncope and collapse: Secondary | ICD-10-CM | POA: Insufficient documentation

## 2011-05-08 DIAGNOSIS — F3289 Other specified depressive episodes: Secondary | ICD-10-CM | POA: Insufficient documentation

## 2011-05-08 DIAGNOSIS — F329 Major depressive disorder, single episode, unspecified: Secondary | ICD-10-CM | POA: Insufficient documentation

## 2011-05-08 LAB — DIFFERENTIAL
Basophils Absolute: 0 10*3/uL (ref 0.0–0.1)
Basophils Relative: 0 % (ref 0–1)
Eosinophils Absolute: 0.1 10*3/uL (ref 0.0–0.7)
Eosinophils Relative: 2 % (ref 0–5)
Lymphocytes Relative: 32 % (ref 12–46)
Lymphs Abs: 1.7 10*3/uL (ref 0.7–4.0)
Monocytes Absolute: 0.5 10*3/uL (ref 0.1–1.0)
Monocytes Relative: 9 % (ref 3–12)
Neutro Abs: 3.1 10*3/uL (ref 1.7–7.7)
Neutrophils Relative %: 57 % (ref 43–77)

## 2011-05-08 LAB — CBC
HCT: 34.5 % — ABNORMAL LOW (ref 36.0–46.0)
Hemoglobin: 11.4 g/dL — ABNORMAL LOW (ref 12.0–15.0)
MCH: 26.5 pg (ref 26.0–34.0)
MCHC: 33 g/dL (ref 30.0–36.0)
MCV: 80 fL (ref 78.0–100.0)
Platelets: 158 10*3/uL (ref 150–400)
RBC: 4.31 MIL/uL (ref 3.87–5.11)
RDW: 13.3 % (ref 11.5–15.5)
WBC: 5.4 10*3/uL (ref 4.0–10.5)

## 2011-05-08 LAB — POCT PREGNANCY, URINE: Preg Test, Ur: NEGATIVE

## 2011-05-08 LAB — POCT I-STAT, CHEM 8
BUN: 19 mg/dL (ref 6–23)
Calcium, Ion: 1.19 mmol/L (ref 1.12–1.32)
Chloride: 106 mEq/L (ref 96–112)
Creatinine, Ser: 0.9 mg/dL (ref 0.4–1.2)
Glucose, Bld: 90 mg/dL (ref 70–99)
HCT: 34 % — ABNORMAL LOW (ref 36.0–46.0)
Hemoglobin: 11.6 g/dL — ABNORMAL LOW (ref 12.0–15.0)
Potassium: 3.5 mEq/L (ref 3.5–5.1)
Sodium: 139 mEq/L (ref 135–145)
TCO2: 22 mmol/L (ref 0–100)

## 2011-05-09 ENCOUNTER — Emergency Department (HOSPITAL_COMMUNITY): Payer: Medicaid Other

## 2011-05-09 LAB — URINE MICROSCOPIC-ADD ON

## 2011-05-09 LAB — URINALYSIS, ROUTINE W REFLEX MICROSCOPIC
Bilirubin Urine: NEGATIVE
Glucose, UA: NEGATIVE mg/dL
Hgb urine dipstick: NEGATIVE
Ketones, ur: NEGATIVE mg/dL
Nitrite: NEGATIVE
Protein, ur: NEGATIVE mg/dL
Specific Gravity, Urine: 1.021 (ref 1.005–1.030)
Urobilinogen, UA: 1 mg/dL (ref 0.0–1.0)
pH: 7.5 (ref 5.0–8.0)

## 2011-05-09 LAB — URINE CULTURE
Colony Count: 80000
Culture  Setup Time: 201206031501

## 2011-05-09 LAB — CK TOTAL AND CKMB (NOT AT ARMC)
CK, MB: 1.6 ng/mL (ref 0.3–4.0)
Relative Index: INVALID (ref 0.0–2.5)
Total CK: 50 U/L (ref 7–177)

## 2011-05-09 LAB — RAPID URINE DRUG SCREEN, HOSP PERFORMED
Amphetamines: NOT DETECTED
Barbiturates: NOT DETECTED
Benzodiazepines: NOT DETECTED
Cocaine: NOT DETECTED
Opiates: NOT DETECTED
Tetrahydrocannabinol: NOT DETECTED

## 2011-05-09 LAB — TROPONIN I: Troponin I: <0.3 ng/mL (ref <0.30)

## 2011-05-09 LAB — ETHANOL: Alcohol, Ethyl (B): <11 mg/dL — ABNORMAL HIGH (ref 0–10)

## 2011-05-17 DIAGNOSIS — R413 Other amnesia: Secondary | ICD-10-CM

## 2011-05-22 ENCOUNTER — Ambulatory Visit
Admission: RE | Admit: 2011-05-22 | Discharge: 2011-05-22 | Disposition: A | Discharge status: Home / Self Care | Payer: Medicaid Other | Source: Ambulatory Visit | Attending: Neurology | Admitting: Neurology

## 2011-05-22 DIAGNOSIS — Q043 Other reduction deformities of brain: Secondary | ICD-10-CM

## 2011-05-22 DIAGNOSIS — R9409 Abnormal results of other function studies of central nervous system: Secondary | ICD-10-CM

## 2011-05-22 DIAGNOSIS — R51 Headache: Secondary | ICD-10-CM

## 2011-05-22 MED ORDER — GADOBENATE DIMEGLUMINE 529 MG/ML IV SOLN
17.0000 mL | Freq: Once | INTRAVENOUS | Status: AC | PRN
  Administered 2011-05-22: 17 mL via INTRAVENOUS

## 2011-05-26 DIAGNOSIS — F329 Major depressive disorder, single episode, unspecified: Secondary | ICD-10-CM

## 2011-05-26 DIAGNOSIS — R413 Other amnesia: Secondary | ICD-10-CM

## 2011-06-17 ENCOUNTER — Ambulatory Visit (INDEPENDENT_AMBULATORY_CARE_PROVIDER_SITE_OTHER): Payer: Medicaid Other

## 2011-06-17 ENCOUNTER — Inpatient Hospital Stay (INDEPENDENT_AMBULATORY_CARE_PROVIDER_SITE_OTHER)
Admission: RE | Admit: 2011-06-17 | Discharge: 2011-06-17 | Disposition: A | Discharge status: Home / Self Care | Payer: Medicaid Other | Source: Ambulatory Visit | Attending: Family Medicine | Admitting: Family Medicine

## 2011-06-17 DIAGNOSIS — S60229A Contusion of unspecified hand, initial encounter: Secondary | ICD-10-CM

## 2011-09-06 LAB — COMPREHENSIVE METABOLIC PANEL
ALT: 15
AST: 17
Albumin: 3 — ABNORMAL LOW
Alkaline Phosphatase: 48
BUN: 6
CO2: 25
Calcium: 8.5
Chloride: 101
Creatinine, Ser: 0.55
GFR calc Af Amer: >60
GFR calc non Af Amer: >60
Glucose, Bld: 81
Potassium: 3.3 — ABNORMAL LOW
Sodium: 135
Total Bilirubin: 0.6
Total Protein: 6.4

## 2011-09-06 LAB — HERPES SIMPLEX VIRUS CULTURE: Culture: DETECTED

## 2011-09-06 LAB — CBC
HCT: 36.7
HCT: 35.4 — ABNORMAL LOW
Hemoglobin: 12.5
Hemoglobin: 11.9 — ABNORMAL LOW
MCHC: 33.9
MCHC: 33.8
MCV: 84.9
MCV: 84.2
Platelets: 125 — ABNORMAL LOW
Platelets: 90 — ABNORMAL LOW
RBC: 4.33
RBC: 4.21
RDW: 15.3
RDW: 14.7
WBC: 10
WBC: 3.8 — ABNORMAL LOW

## 2011-09-06 LAB — URINALYSIS, ROUTINE W REFLEX MICROSCOPIC
Bilirubin Urine: NEGATIVE
Glucose, UA: NEGATIVE
Glucose, UA: NEGATIVE
Hgb urine dipstick: NEGATIVE
Hgb urine dipstick: NEGATIVE
Ketones, ur: >80 — AB
Ketones, ur: 15 — AB
Leukocytes, UA: NEGATIVE
Nitrite: NEGATIVE
Nitrite: POSITIVE — AB
Protein, ur: NEGATIVE
Protein, ur: 100 — AB
Specific Gravity, Urine: 1.023
Specific Gravity, Urine: 1.025
Urobilinogen, UA: 0.2
Urobilinogen, UA: 1
pH: 6
pH: 6.5

## 2011-09-06 LAB — URINE MICROSCOPIC-ADD ON

## 2011-09-06 LAB — DIFFERENTIAL
Basophils Absolute: 0
Basophils Absolute: 0
Basophils Relative: 0
Basophils Relative: 0
Eosinophils Absolute: 0
Eosinophils Absolute: 0
Eosinophils Relative: 0
Eosinophils Relative: 0
Lymphocytes Relative: 4 — ABNORMAL LOW
Lymphocytes Relative: 16
Lymphs Abs: 0.4 — ABNORMAL LOW
Lymphs Abs: 0.6 — ABNORMAL LOW
Monocytes Absolute: 0.4
Monocytes Absolute: 0.3
Monocytes Relative: 4
Monocytes Relative: 9
Neutro Abs: 9.2 — ABNORMAL HIGH
Neutro Abs: 2.8
Neutrophils Relative %: 92 — ABNORMAL HIGH
Neutrophils Relative %: 74

## 2011-09-06 LAB — POCT I-STAT, CHEM 8
BUN: 9
Calcium, Ion: 1.11 — ABNORMAL LOW
Chloride: 101
Creatinine, Ser: 0.7
Glucose, Bld: 80
HCT: 38
Hemoglobin: 12.9
Potassium: 3.3 — ABNORMAL LOW
Sodium: 136
TCO2: 25

## 2011-09-06 LAB — PROTIME-INR
INR: 1
Prothrombin Time: 13.7

## 2011-09-06 LAB — TYPE AND SCREEN
ABO/RH(D): A POS
Antibody Screen: NEGATIVE

## 2011-09-06 LAB — URINE CULTURE: Colony Count: >=100000

## 2011-09-06 LAB — HSV(HERPES SMPLX)ABS-I+II(IGG+IGM)-BLD
Herpes Simplex Vrs I + II Ab, IgG: 7.1 IV — ABNORMAL HIGH
Herpes Simplex Vrs I&II-IgM Ab (EIA): DETECTED — AB

## 2011-09-06 LAB — APTT: aPTT: 36

## 2011-09-06 LAB — ABO/RH: ABO/RH(D): A POS

## 2011-09-08 LAB — HSV IGM AB TITER: HSV-IgM Ab Titer: 1:20 {titer} — ABNORMAL HIGH

## 2011-09-19 LAB — CBC
HCT: 29.9 — ABNORMAL LOW
HCT: 36.5
Hemoglobin: 10.2 — ABNORMAL LOW
Hemoglobin: 12.2
MCHC: 33.4
MCHC: 34.2
MCV: 85.1
MCV: 85.7
Platelets: 143 — ABNORMAL LOW
Platelets: 155
RBC: 3.51 — ABNORMAL LOW
RBC: 4.26
RDW: 13.6
RDW: 13.6
WBC: 16.9 — ABNORMAL HIGH
WBC: 9.8

## 2011-09-19 LAB — RPR: RPR Ser Ql: NONREACTIVE

## 2011-11-14 ENCOUNTER — Emergency Department (INDEPENDENT_AMBULATORY_CARE_PROVIDER_SITE_OTHER)
Admission: EM | Admit: 2011-11-14 | Discharge: 2011-11-14 | Disposition: A | Discharge status: Home / Self Care | Payer: Medicaid Other | Source: Home / Self Care | Attending: Emergency Medicine | Admitting: Emergency Medicine

## 2011-11-14 DIAGNOSIS — R05 Cough: Secondary | ICD-10-CM

## 2011-11-14 DIAGNOSIS — J069 Acute upper respiratory infection, unspecified: Secondary | ICD-10-CM

## 2011-11-14 MED ORDER — PREDNISONE 20 MG PO TABS: 20.0000 mg | ORAL_TABLET | Freq: Every day | ORAL | Status: AC

## 2011-11-14 MED ORDER — HYDROCOD POLST-CHLORPHEN POLST 10-8 MG/5ML PO LQCR: 5.0000 mL | Freq: Two times a day (BID) | ORAL | Status: DC | PRN

## 2011-11-14 NOTE — ED Provider Notes (Addendum)
History     CSN: [id number] Arrival date & time: 11/14/2011  9:50 AM   First MD Initiated Contact with Patient 11/14/11 1003      Chief Complaint  Patient presents with  . Cough    (Consider location/radiation/quality/duration/timing/severity/associated sxs/prior treatment) HPI Comments: Coughing dry all the time,, my chest hurts from coughing " daughter had the flu last week" im congested as well of my nose, have had no fevers at all"  Patient is a 30 y.o. female presenting with cough. The history is provided by the patient.  Cough This is a new problem. The problem occurs constantly. The cough is non-productive. Associated symptoms include chest pain. Pertinent negatives include no chills, no sweats, no weight loss, no ear congestion, no ear pain, no headaches, no rhinorrhea, no sore throat, no shortness of breath and no wheezing. She has tried cough syrup for the symptoms. The treatment provided no relief. She is a smoker. Her past medical history does not include pneumonia or asthma.    History reviewed. No pertinent past medical history.  Past Surgical History  Procedure Date  . Tubal ligation     History reviewed. No pertinent family history.  History  Substance Use Topics  . Smoking status: Not on file  . Smokeless tobacco: Not on file  . Alcohol Use: No    OB History    Grav Para Term Preterm Abortions TAB SAB Ect Mult Living                  Review of Systems  Constitutional: Negative for chills and weight loss.  HENT: Negative for ear pain, sore throat and rhinorrhea.   Respiratory: Positive for cough. Negative for shortness of breath and wheezing.   Cardiovascular: Positive for chest pain.  Neurological: Negative for headaches.    Allergies  Review of patient's allergies indicates no known allergies.  Home Medications   Current Outpatient Rx  Name Route Sig Dispense Refill  . ARIPIPRAZOLE 5 MG PO TABS Oral Take 5 mg by mouth daily.      .  SERTRALINE HCL 50 MG PO TABS Oral Take 50 mg by mouth daily.      Marland Kitchen HYDROCOD POLST-CHLORPHEN POLST 10-8 MG/5ML PO LQCR Oral Take 5 mLs by mouth every 12 (twelve) hours as needed. 140 mL 0  . PREDNISONE 20 MG PO TABS Oral Take 1 tablet (20 mg total) by mouth daily. 7 tablet 0    BP 112/79  Pulse 86  Temp(Src) 98.2 F (36.8 C) (Oral)  Resp 16  SpO2 98%  Physical Exam  Nursing note and vitals reviewed. Constitutional: She appears well-developed and well-nourished. No distress.  Neck: Normal range of motion. No JVD present.  Cardiovascular:  No murmur heard. Pulmonary/Chest: Effort normal. No respiratory distress. She has no decreased breath sounds. She has no wheezes. She has no rhonchi. She has no rales. She exhibits no tenderness.  Abdominal: Soft.  Musculoskeletal: Normal range of motion.  Lymphadenopathy:    She has no cervical adenopathy.  Neurological: She is alert.  Skin: Skin is warm. No rash noted.    ED Course  Procedures (including critical care time)  Labs Reviewed - No data to display No results found.   1. Cough   2. Acute URI       MDM  Reactive cough. Afebrile, No dyspnea. Afebrile.        Jimmie Molly, MD 11/14/11 1123  Jimmie Molly, MD 11/14/11 [phone]

## 2011-11-14 NOTE — ED Notes (Signed)
Reports she has had a non-productive  Cough since 12-08; daughter recently had the flu ; NAD; c/o could not sleep last PM due to syx, feels as if she has "rocks in her chest"

## 2012-11-15 ENCOUNTER — Ambulatory Visit: Payer: Medicaid Other | Attending: Family Medicine | Admitting: Physical Therapy

## 2012-11-15 DIAGNOSIS — IMO0001 Reserved for inherently not codable concepts without codable children: Secondary | ICD-10-CM | POA: Insufficient documentation

## 2012-11-15 DIAGNOSIS — R262 Difficulty in walking, not elsewhere classified: Secondary | ICD-10-CM | POA: Insufficient documentation

## 2012-11-15 DIAGNOSIS — M25579 Pain in unspecified ankle and joints of unspecified foot: Secondary | ICD-10-CM | POA: Insufficient documentation

## 2012-11-23 ENCOUNTER — Ambulatory Visit: Payer: Medicaid Other | Admitting: Physical Therapy

## 2012-11-30 ENCOUNTER — Ambulatory Visit: Payer: Medicaid Other | Admitting: Physical Therapy

## 2013-01-14 ENCOUNTER — Encounter: Payer: Self-pay | Admitting: *Deleted

## 2013-02-19 ENCOUNTER — Ambulatory Visit: Payer: Self-pay | Admitting: Nurse Practitioner

## 2014-02-04 ENCOUNTER — Emergency Department (HOSPITAL_COMMUNITY)
Admission: EM | Admit: 2014-02-04 | Discharge: 2014-02-04 | Disposition: A | Discharge status: Home / Self Care | Payer: Medicaid Other | Attending: Emergency Medicine | Admitting: Emergency Medicine

## 2014-02-04 ENCOUNTER — Encounter (HOSPITAL_COMMUNITY): Payer: Self-pay | Admitting: Emergency Medicine

## 2014-02-04 ENCOUNTER — Emergency Department (HOSPITAL_COMMUNITY): Payer: Medicaid Other

## 2014-02-04 DIAGNOSIS — N83202 Unspecified ovarian cyst, left side: Secondary | ICD-10-CM

## 2014-02-04 DIAGNOSIS — F3289 Other specified depressive episodes: Secondary | ICD-10-CM | POA: Insufficient documentation

## 2014-02-04 DIAGNOSIS — N83209 Unspecified ovarian cyst, unspecified side: Secondary | ICD-10-CM | POA: Insufficient documentation

## 2014-02-04 DIAGNOSIS — Z79899 Other long term (current) drug therapy: Secondary | ICD-10-CM | POA: Insufficient documentation

## 2014-02-04 DIAGNOSIS — Z8669 Personal history of other diseases of the nervous system and sense organs: Secondary | ICD-10-CM | POA: Insufficient documentation

## 2014-02-04 DIAGNOSIS — Z3202 Encounter for pregnancy test, result negative: Secondary | ICD-10-CM | POA: Insufficient documentation

## 2014-02-04 DIAGNOSIS — F329 Major depressive disorder, single episode, unspecified: Secondary | ICD-10-CM | POA: Insufficient documentation

## 2014-02-04 LAB — CBC WITH DIFFERENTIAL/PLATELET
Basophils Absolute: 0 10*3/uL (ref 0.0–0.1)
Basophils Relative: 0 % (ref 0–1)
Eosinophils Absolute: 0.2 10*3/uL (ref 0.0–0.7)
Eosinophils Relative: 3 % (ref 0–5)
HCT: 37.4 % (ref 36.0–46.0)
Hemoglobin: 12 g/dL (ref 12.0–15.0)
Lymphocytes Relative: 28 % (ref 12–46)
Lymphs Abs: 1.8 10*3/uL (ref 0.7–4.0)
MCH: 26.1 pg (ref 26.0–34.0)
MCHC: 32.1 g/dL (ref 30.0–36.0)
MCV: 81.3 fL (ref 78.0–100.0)
Monocytes Absolute: 0.5 10*3/uL (ref 0.1–1.0)
Monocytes Relative: 8 % (ref 3–12)
Neutro Abs: 4 10*3/uL (ref 1.7–7.7)
Neutrophils Relative %: 61 % (ref 43–77)
Platelets: 202 10*3/uL (ref 150–400)
RBC: 4.6 MIL/uL (ref 3.87–5.11)
RDW: 13.9 % (ref 11.5–15.5)
WBC: 6.6 10*3/uL (ref 4.0–10.5)

## 2014-02-04 LAB — COMPREHENSIVE METABOLIC PANEL
ALT: 13 U/L (ref 0–35)
AST: 13 U/L (ref 0–37)
Albumin: 3.5 g/dL (ref 3.5–5.2)
Alkaline Phosphatase: 87 U/L (ref 39–117)
BUN: 12 mg/dL (ref 6–23)
CO2: 30 mEq/L (ref 19–32)
Calcium: 9.5 mg/dL (ref 8.4–10.5)
Chloride: 98 mEq/L (ref 96–112)
Creatinine, Ser: 0.83 mg/dL (ref 0.50–1.10)
GFR calc Af Amer: >90 mL/min (ref >90)
GFR calc non Af Amer: >90 mL/min (ref >90)
Glucose, Bld: 85 mg/dL (ref 70–99)
Potassium: 4.2 mEq/L (ref 3.7–5.3)
Sodium: 138 mEq/L (ref 137–147)
Total Bilirubin: <0.2 mg/dL — ABNORMAL LOW (ref 0.3–1.2)
Total Protein: 7.7 g/dL (ref 6.0–8.3)

## 2014-02-04 LAB — WET PREP, GENITAL
Clue Cells Wet Prep HPF POC: NONE SEEN
Trich, Wet Prep: NONE SEEN
Yeast Wet Prep HPF POC: NONE SEEN

## 2014-02-04 LAB — URINALYSIS, ROUTINE W REFLEX MICROSCOPIC
Bilirubin Urine: NEGATIVE
Glucose, UA: NEGATIVE mg/dL
Hgb urine dipstick: NEGATIVE
Ketones, ur: NEGATIVE mg/dL
Nitrite: NEGATIVE
Protein, ur: NEGATIVE mg/dL
Specific Gravity, Urine: 1.023 (ref 1.005–1.030)
Urobilinogen, UA: 1 mg/dL (ref 0.0–1.0)
pH: 6.5 (ref 5.0–8.0)

## 2014-02-04 LAB — URINE MICROSCOPIC-ADD ON

## 2014-02-04 LAB — LIPASE, BLOOD: Lipase: 18 U/L (ref 11–59)

## 2014-02-04 LAB — POC URINE PREG, ED: Preg Test, Ur: NEGATIVE

## 2014-02-04 MED ORDER — OXYCODONE-ACETAMINOPHEN 5-325 MG PO TABS: 1.0000 | ORAL_TABLET | ORAL | Status: DC | PRN

## 2014-02-04 MED ORDER — ONDANSETRON HCL 4 MG PO TABS: 4.0000 mg | ORAL_TABLET | Freq: Four times a day (QID) | ORAL | Status: DC

## 2014-02-04 MED ORDER — IBUPROFEN 800 MG PO TABS: 800.0000 mg | ORAL_TABLET | Freq: Three times a day (TID) | ORAL | Status: DC | PRN

## 2014-02-04 MED ORDER — IBUPROFEN 800 MG PO TABS
800.0000 mg | ORAL_TABLET | Freq: Once | ORAL | Status: AC
  Administered 2014-02-04: 800 mg via ORAL
  Filled 2014-02-04: qty 1

## 2014-02-04 NOTE — ED Notes (Signed)
Pt returned from CT °

## 2014-02-04 NOTE — ED Provider Notes (Signed)
TIME SEEN: 7:33 PM  CHIEF COMPLAINT: Abdominal pain  HPI: Patient is a 33 year old G4 P2 A2 status post C-section and BTL who presents emergency department with bilateral pelvic pain that she describes as moderate, sharp and shooting without radiation that started 4 days ago. It is worse with walking and better with rest. She has a history of ovarian cyst but states she does not know if this is the same pain or not. She denies any fevers or chills or vomiting but has had nausea and diarrhea. Last menstrual period was February 9. No dysuria, hematuria or vaginal discharge or bleeding. No history of sick contacts.  ROS: See HPI Constitutional: no fever  Eyes: no drainage  ENT: no runny nose   Cardiovascular:  no chest pain  Resp: no SOB  GI: no vomiting GU: no dysuria Integumentary: no rash  Allergy: no hives  Musculoskeletal: no leg swelling  Neurological: no slurred speech ROS otherwise negative  PAST MEDICAL HISTORY/PAST SURGICAL HISTORY:  Past Medical History  Diagnosis Date  . Depression   . Seizures     non epilepic events per epi;epsy monitoring unit    MEDICATIONS:  Prior to Admission medications   Medication Sig Start Date End Date Taking? Authorizing Provider  DULoxetine (CYMBALTA) 30 MG capsule Take 30 mg by mouth daily.   Yes Historical Provider, MD  sertraline (ZOLOFT) 50 MG tablet Take 50 mg by mouth daily.     Yes Historical Provider, MD    ALLERGIES:  No Known Allergies  SOCIAL HISTORY:  History  Substance Use Topics  . Smoking status: Never Smoker   . Smokeless tobacco: Not on file  . Alcohol Use: No    FAMILY HISTORY: No family history on file.  EXAM: BP 106/94  Pulse 90  Temp(Src) 97.9 F (36.6 C) (Oral)  Resp 16  Wt 200 lb (90.719 kg)  SpO2 97%  LMP 01/14/2014 CONSTITUTIONAL: Alert and oriented and responds appropriately to questions. Well-appearing; well-nourished HEAD: Normocephalic EYES: Conjunctivae clear, PERRL ENT: normal nose; no  rhinorrhea; moist mucous membranes; pharynx without lesions noted NECK: Supple, no meningismus, no LAD  CARD: RRR; S1 and S2 appreciated; no murmurs, no clicks, no rubs, no gallops RESP: Normal chest excursion without splinting or tachypnea; breath sounds clear and equal bilaterally; no wheezes, no rhonchi, no rales,  ABD/GI: Normal bowel sounds; non-distended; soft, mildly tender to palpation in the pelvic region without guarding or rebound, no peritoneal signs, no tenderness at McBurney's point GU:  Normal external genitalia, no cervical motion tenderness, mild left adnexal tenderness without fullness, no vaginal bleeding, patient has a moderate amount of thin, white fast-growing vaginal discharge BACK:  The back appears normal and is non-tender to palpation, there is no CVA tenderness EXT: Normal ROM in all joints; non-tender to palpation; no edema; normal capillary refill; no cyanosis    SKIN: Normal color for age and race; warm NEURO: Moves all extremities equally PSYCH: The patient's mood and manner are appropriate. Grooming and personal hygiene are appropriate.  MEDICAL DECISION MAKING: Patient here with pelvic pain. Suspect ovarian cyst versus TOA versus PID. Less likely torsion given patient appears comfortable on exam. Her labs, urine are unremarkable. Urine pregnancy test negative. Patient is 4 active with one partner. She has a history of herpes but no other STDs. Will obtain pelvic exam with cultures and transvaginal ultrasound to evaluate patient's adnexa.  ED PROGRESS: Patient's ultrasound shows a thickened endometrium and a cyst 2 cm left ovarian cyst. No torsion. Pelvic  exam is unremarkable. Wet prep pending.    Wet prep shows no abnormality other than a few PVCs. No concern for PID, STD at this time. We'll discharge him with pain medication, nausea medicine and return precautions for her ovarian cyst. Patient verbalizes understanding and is comfortable plan.   [place],  DO 02/04/14 2338

## 2014-02-04 NOTE — ED Notes (Signed)
Md Ward at bedside.

## 2014-02-04 NOTE — Discharge Instructions (Signed)

## 2014-02-04 NOTE — ED Notes (Signed)
Pt c/o LLQ pain abd pain that started a 4 days ago and has progressively become worse. Pain increases with walking. Denies dysuria/hematuria/n/v. C/o increased urinary frequency and loose stools. Reports she just had a physical done last weeks, they did a pelvic exam and had difficulty finding her cervix and had to move the speculum around a lot which caused a lot of discomfort, pt unsure if this is what is causing the pain. Also sts "I think have a history of ovarian cysts". Nad, skin warm and dry, resp e/u.

## 2014-02-05 LAB — GC/CHLAMYDIA PROBE AMP
CT Probe RNA: NEGATIVE
GC Probe RNA: NEGATIVE

## 2014-02-10 ENCOUNTER — Other Ambulatory Visit (HOSPITAL_COMMUNITY): Payer: Self-pay | Admitting: Obstetrics & Gynecology

## 2014-02-10 ENCOUNTER — Other Ambulatory Visit: Payer: Self-pay | Admitting: Obstetrics & Gynecology

## 2014-02-10 DIAGNOSIS — R109 Unspecified abdominal pain: Secondary | ICD-10-CM

## 2014-02-11 ENCOUNTER — Encounter (HOSPITAL_COMMUNITY): Payer: Self-pay

## 2014-02-11 ENCOUNTER — Ambulatory Visit (HOSPITAL_COMMUNITY)
Admission: RE | Admit: 2014-02-11 | Discharge: 2014-02-11 | Disposition: A | Discharge status: Home / Self Care | Payer: Medicaid Other | Source: Ambulatory Visit | Attending: Obstetrics & Gynecology | Admitting: Obstetrics & Gynecology

## 2014-02-11 DIAGNOSIS — R109 Unspecified abdominal pain: Secondary | ICD-10-CM

## 2014-02-11 DIAGNOSIS — R1031 Right lower quadrant pain: Secondary | ICD-10-CM | POA: Insufficient documentation

## 2014-02-11 DIAGNOSIS — N209 Urinary calculus, unspecified: Secondary | ICD-10-CM | POA: Insufficient documentation

## 2014-02-11 MED ORDER — IOHEXOL 300 MG/ML  SOLN
100.0000 mL | Freq: Once | INTRAMUSCULAR | Status: AC | PRN
  Administered 2014-02-11: 100 mL via INTRAVENOUS

## 2014-02-17 DIAGNOSIS — F419 Anxiety disorder, unspecified: Secondary | ICD-10-CM | POA: Insufficient documentation

## 2014-02-17 DIAGNOSIS — Q040 Congenital malformations of corpus callosum: Secondary | ICD-10-CM | POA: Insufficient documentation

## 2014-02-17 DIAGNOSIS — Z9289 Personal history of other medical treatment: Secondary | ICD-10-CM | POA: Insufficient documentation

## 2014-02-17 DIAGNOSIS — E669 Obesity, unspecified: Secondary | ICD-10-CM | POA: Insufficient documentation

## 2014-02-17 DIAGNOSIS — F411 Generalized anxiety disorder: Secondary | ICD-10-CM | POA: Insufficient documentation

## 2014-03-04 DIAGNOSIS — N200 Calculus of kidney: Secondary | ICD-10-CM | POA: Insufficient documentation

## 2014-03-07 ENCOUNTER — Encounter (HOSPITAL_COMMUNITY): Payer: Self-pay | Admitting: Emergency Medicine

## 2014-03-07 ENCOUNTER — Emergency Department (HOSPITAL_COMMUNITY): Payer: Medicaid Other

## 2014-03-07 ENCOUNTER — Emergency Department (HOSPITAL_COMMUNITY)
Admission: EM | Admit: 2014-03-07 | Discharge: 2014-03-07 | Disposition: A | Discharge status: Home / Self Care | Payer: Medicaid Other | Attending: Emergency Medicine | Admitting: Emergency Medicine

## 2014-03-07 DIAGNOSIS — Z8669 Personal history of other diseases of the nervous system and sense organs: Secondary | ICD-10-CM | POA: Insufficient documentation

## 2014-03-07 DIAGNOSIS — F329 Major depressive disorder, single episode, unspecified: Secondary | ICD-10-CM | POA: Insufficient documentation

## 2014-03-07 DIAGNOSIS — N200 Calculus of kidney: Secondary | ICD-10-CM | POA: Insufficient documentation

## 2014-03-07 DIAGNOSIS — Z3202 Encounter for pregnancy test, result negative: Secondary | ICD-10-CM | POA: Insufficient documentation

## 2014-03-07 DIAGNOSIS — N23 Unspecified renal colic: Secondary | ICD-10-CM

## 2014-03-07 DIAGNOSIS — F3289 Other specified depressive episodes: Secondary | ICD-10-CM | POA: Insufficient documentation

## 2014-03-07 DIAGNOSIS — R319 Hematuria, unspecified: Secondary | ICD-10-CM

## 2014-03-07 DIAGNOSIS — Z79899 Other long term (current) drug therapy: Secondary | ICD-10-CM | POA: Insufficient documentation

## 2014-03-07 DIAGNOSIS — Z880 Allergy status to penicillin: Secondary | ICD-10-CM | POA: Insufficient documentation

## 2014-03-07 LAB — COMPREHENSIVE METABOLIC PANEL
ALT: 11 U/L (ref 0–35)
AST: 13 U/L (ref 0–37)
Albumin: 3.3 g/dL — ABNORMAL LOW (ref 3.5–5.2)
Alkaline Phosphatase: 81 U/L (ref 39–117)
BUN: 12 mg/dL (ref 6–23)
CO2: 26 mEq/L (ref 19–32)
Calcium: 9 mg/dL (ref 8.4–10.5)
Chloride: 99 mEq/L (ref 96–112)
Creatinine, Ser: 0.67 mg/dL (ref 0.50–1.10)
GFR calc Af Amer: >90 mL/min (ref >90)
GFR calc non Af Amer: >90 mL/min (ref >90)
Glucose, Bld: 78 mg/dL (ref 70–99)
Potassium: 3.9 mEq/L (ref 3.7–5.3)
Sodium: 138 mEq/L (ref 137–147)
Total Bilirubin: 0.2 mg/dL — ABNORMAL LOW (ref 0.3–1.2)
Total Protein: 7.3 g/dL (ref 6.0–8.3)

## 2014-03-07 LAB — CBC WITH DIFFERENTIAL/PLATELET
Basophils Absolute: 0 10*3/uL (ref 0.0–0.1)
Basophils Relative: 0 % (ref 0–1)
Eosinophils Absolute: 0.1 10*3/uL (ref 0.0–0.7)
Eosinophils Relative: 2 % (ref 0–5)
HCT: 35 % — ABNORMAL LOW (ref 36.0–46.0)
Hemoglobin: 11.5 g/dL — ABNORMAL LOW (ref 12.0–15.0)
Lymphocytes Relative: 27 % (ref 12–46)
Lymphs Abs: 1.7 10*3/uL (ref 0.7–4.0)
MCH: 26.6 pg (ref 26.0–34.0)
MCHC: 32.9 g/dL (ref 30.0–36.0)
MCV: 80.8 fL (ref 78.0–100.0)
Monocytes Absolute: 0.4 10*3/uL (ref 0.1–1.0)
Monocytes Relative: 6 % (ref 3–12)
Neutro Abs: 4.2 10*3/uL (ref 1.7–7.7)
Neutrophils Relative %: 66 % (ref 43–77)
Platelets: 177 10*3/uL (ref 150–400)
RBC: 4.33 MIL/uL (ref 3.87–5.11)
RDW: 13.9 % (ref 11.5–15.5)
WBC: 6.4 10*3/uL (ref 4.0–10.5)

## 2014-03-07 LAB — URINALYSIS, ROUTINE W REFLEX MICROSCOPIC
Bilirubin Urine: NEGATIVE
Glucose, UA: NEGATIVE mg/dL
Ketones, ur: NEGATIVE mg/dL
Nitrite: NEGATIVE
Protein, ur: NEGATIVE mg/dL
Specific Gravity, Urine: 1.023 (ref 1.005–1.030)
Urobilinogen, UA: 0.2 mg/dL (ref 0.0–1.0)
pH: 5.5 (ref 5.0–8.0)

## 2014-03-07 LAB — URINE MICROSCOPIC-ADD ON

## 2014-03-07 LAB — POC URINE PREG, ED: Preg Test, Ur: NEGATIVE

## 2014-03-07 MED ORDER — ONDANSETRON HCL 4 MG/2ML IJ SOLN
4.0000 mg | Freq: Once | INTRAMUSCULAR | Status: DC
  Filled 2014-03-07: qty 2

## 2014-03-07 MED ORDER — SODIUM CHLORIDE 0.9 % IV BOLUS (SEPSIS)
1000.0000 mL | Freq: Once | INTRAVENOUS | Status: AC
  Administered 2014-03-07: 1000 mL via INTRAVENOUS

## 2014-03-07 MED ORDER — KETOROLAC TROMETHAMINE 15 MG/ML IJ SOLN
15.0000 mg | Freq: Once | INTRAMUSCULAR | Status: AC
  Administered 2014-03-07: 15 mg via INTRAVENOUS
  Filled 2014-03-07: qty 1

## 2014-03-07 MED ORDER — ONDANSETRON HCL 4 MG/2ML IJ SOLN
4.0000 mg | Freq: Once | INTRAMUSCULAR | Status: AC
  Administered 2014-03-07: 4 mg via INTRAVENOUS

## 2014-03-07 MED ORDER — OXYCODONE-ACETAMINOPHEN 5-325 MG PO TABS: 1.0000 | ORAL_TABLET | ORAL | Status: DC | PRN

## 2014-03-07 MED ORDER — HYDROMORPHONE HCL PF 1 MG/ML IJ SOLN
1.0000 mg | Freq: Once | INTRAMUSCULAR | Status: AC
  Administered 2014-03-07: 1 mg via INTRAVENOUS
  Filled 2014-03-07: qty 1

## 2014-03-07 NOTE — Discharge Instructions (Signed)
Kidney Stones  Kidney stones (urolithiasis) are deposits that form inside your kidneys. The intense pain is caused by the stone moving through the urinary tract. When the stone moves, the ureter goes into spasm around the stone. The stone is usually passed in the urine.   CAUSES   · A disorder that makes certain neck glands produce too much parathyroid hormone (primary hyperparathyroidism).  · A buildup of uric acid crystals, similar to gout in your joints.  · Narrowing (stricture) of the ureter.  · A kidney obstruction present at birth (congenital obstruction).  · Previous surgery on the kidney or ureters.  · Numerous kidney infections.  SYMPTOMS   · Feeling sick to your stomach (nauseous).  · Throwing up (vomiting).  · Blood in the urine (hematuria).  · Pain that usually spreads (radiates) to the groin.  · Frequency or urgency of urination.  DIAGNOSIS   · Taking a history and physical exam.  · Blood or urine tests.  · CT scan.  · Occasionally, an examination of the inside of the urinary bladder (cystoscopy) is performed.  TREATMENT   · Observation.  · Increasing your fluid intake.  · Extracorporeal shock wave lithotripsy This is a noninvasive procedure that uses shock waves to break up kidney stones.  · Surgery may be needed if you have severe pain or persistent obstruction. There are various surgical procedures. Most of the procedures are performed with the use of small instruments. Only small incisions are needed to accommodate these instruments, so recovery time is minimized.  The size, location, and chemical composition are all important variables that will determine the proper choice of action for you. Talk to your health care provider to better understand your situation so that you will minimize the risk of injury to yourself and your kidney.   HOME CARE INSTRUCTIONS   · Drink enough water and fluids to keep your urine clear or pale yellow. This will help you to pass the stone or stone fragments.  · Strain  all urine through the provided strainer. Keep all particulate matter and stones for your health care provider to see. The stone causing the pain may be as small as a grain of salt. It is very important to use the strainer each and every time you pass your urine. The collection of your stone will allow your health care provider to analyze it and verify that a stone has actually passed. The stone analysis will often identify what you can do to reduce the incidence of recurrences.  · Only take over-the-counter or prescription medicines for pain, discomfort, or fever as directed by your health care provider.  · Make a follow-up appointment with your health care provider as directed.  · Get follow-up X-rays if required. The absence of pain does not always mean that the stone has passed. It may have only stopped moving. If the urine remains completely obstructed, it can cause loss of kidney function or even complete destruction of the kidney. It is your responsibility to make sure X-rays and follow-ups are completed. Ultrasounds of the kidney can show blockages and the status of the kidney. Ultrasounds are not associated with any radiation and can be performed easily in a matter of minutes.  SEEK MEDICAL CARE IF:  · You experience pain that is progressive and unresponsive to any pain medicine you have been prescribed.  SEEK IMMEDIATE MEDICAL CARE IF:   · Pain cannot be controlled with the prescribed medicine.  · You have a fever   or shaking chills.  · The severity or intensity of pain increases over 18 hours and is not relieved by pain medicine.  · You develop a new onset of abdominal pain.  · You feel faint or pass out.  · You are unable to urinate.  MAKE SURE YOU:   · Understand these instructions.  · Will watch your condition.  · Will get help right away if you are not doing well or get worse.  Document Released: 11/21/2005 Document Revised: 07/24/2013 Document Reviewed: 04/24/2013  [employer]® Patient Information ©2014  [employer].

## 2014-03-07 NOTE — ED Notes (Addendum)
Pt reports dx by [facility] with 2 kidney stones, one on each side. Pt now has bleeding from vaginal/ urethra to the point where she needs to wear a pad. Bleeding started 2 days ago. Pain 8/10. Pain in lower abdomen radiating to flanks. Reports nausea, denies vomiting.

## 2014-03-14 NOTE — ED Provider Notes (Signed)
CSN: [id number]     Arrival date & time 03/07/14  1132 History   First MD Initiated Contact with Patient 03/07/14 1143     Chief Complaint  Patient presents with  . Nephrolithiasis  . hematuria      (Consider location/radiation/quality/duration/timing/severity/associated sxs/prior Treatment) HPI  32yF with b/l flank pain, L>R. Radiates to groin. Constant with occasional sharper pain. No appreciable exacerbating or relieving factors.  Associated with hematuria. No fever or chills. Nausea. No vomiting. Has been taking ibuprofen with mild relief.   Past Medical History  Diagnosis Date  . Depression   . Seizures     non epilepic events per epi;epsy monitoring unit   Past Surgical History  Procedure Laterality Date  . Tubal ligation    . Cesarean section N/A    History reviewed. No pertinent family history. History  Substance Use Topics  . Smoking status: Never Smoker   . Smokeless tobacco: Not on file  . Alcohol Use: No   OB History   Grav Para Term Preterm Abortions TAB SAB Ect Mult Living                 Review of Systems  All systems reviewed and negative, other than as noted in HPI.   Allergies  Sulfamethoxazole-tmp ds and Penicillins  Home Medications   Current Outpatient Rx  Name  Route  Sig  Dispense  Refill  . ARIPiprazole (ABILIFY) 5 MG tablet   Oral   Take 5 mg by mouth daily after supper.         . DULoxetine (CYMBALTA) 30 MG capsule   Oral   Take 30 mg by mouth daily after supper.          Marland Kitchen FLUoxetine (PROZAC) 20 MG capsule   Oral   Take 20 mg by mouth daily after supper.         Marland Kitchen ibuprofen (ADVIL,MOTRIN) 800 MG tablet   Oral   Take 1 tablet (800 mg total) by mouth every 8 (eight) hours as needed for mild pain.   30 tablet   0   . nitrofurantoin (MACRODANTIN) 100 MG capsule   Oral   Take 100 mg by mouth every 12 (twelve) hours. She is taking for 7 days for a UTI (urinary tract infection). She started on 03/06/14 and has not complete  this regimen.         . ondansetron (ZOFRAN) 4 MG tablet   Oral   Take 4 mg by mouth every 6 (six) hours as needed for nausea or vomiting.         . solifenacin (VESICARE) 5 MG tablet   Oral   Take 5 mg by mouth daily after supper.         Marland Kitchen oxyCODONE-acetaminophen (PERCOCET/ROXICET) 5-325 MG per tablet   Oral   Take 1-2 tablets by mouth every 4 (four) hours as needed for severe pain.   20 tablet   0    BP 108/52  Pulse 71  Temp(Src) 97.9 F (36.6 C) (Oral)  Resp 12  SpO2 96% Physical Exam  Nursing note and vitals reviewed. Constitutional: She appears well-developed and well-nourished.  Standing beside stretcher. Appears mildly uncomfortable.   HENT:  Head: Normocephalic and atraumatic.  Eyes: Conjunctivae are normal. Right eye exhibits no discharge. Left eye exhibits no discharge.  Neck: Neck supple.  Cardiovascular: Normal rate, regular rhythm and normal heart sounds.  Exam reveals no gallop and no friction rub.   No murmur heard. Pulmonary/Chest:  Effort normal and breath sounds normal. No respiratory distress.  Abdominal: Soft. She exhibits no distension. There is no tenderness.  Genitourinary:  l cva tenderness.   Musculoskeletal: She exhibits no edema and no tenderness.  Neurological: She is alert.  Skin: Skin is warm and dry.  Psychiatric: She has a normal mood and affect. Her behavior is normal. Thought content normal.    ED Course  Procedures (including critical care time) Labs Review Labs Reviewed  CBC WITH DIFFERENTIAL - Abnormal; Notable for the following:    Hemoglobin 11.5 (*)    HCT 35.0 (*)    All other components within normal limits  COMPREHENSIVE METABOLIC PANEL - Abnormal; Notable for the following:    Albumin 3.3 (*)    Total Bilirubin 0.2 (*)    All other components within normal limits  URINALYSIS, ROUTINE W REFLEX MICROSCOPIC - Abnormal; Notable for the following:    Color, Urine RED (*)    APPearance CLOUDY (*)    Hgb urine  dipstick LARGE (*)    Leukocytes, UA SMALL (*)    All other components within normal limits  URINE MICROSCOPIC-ADD ON  POC URINE PREG, ED   Imaging Review No results found.  US Renal  03/07/2014   CLINICAL DATA:  Nephrolithiasis  EXAM: RENAL/URINARY TRACT ULTRASOUND COMPLETE  COMPARISON:  02/11/2014  FINDINGS: Right Kidney:  Length: 12.9 cm. A few small nonobstructing renal stones are noted similar to that seen on the prior CT examination.  Left Kidney:  Length: 12.7 cm. The known calculi on the left are not well visualized on this exam.  Bladder:  Appears normal for degree of bladder distention.  IMPRESSION: Right renal calculi. Known left renal calculi are not well appreciated on this study. No acute abnormality is noted.   Electronically Signed   By: Inez Catalina M.D.   On: 03/07/2014 13:27    EKG Interpretation None      MDM   Final diagnoses:  Ureteral colic  Hematuria    71UK with very likely ureteral colic. Known stones. Hematuria on UA. Provided history consistent. No hydro on renal US. Renal function ok. Pain controlled. Plan continues symptomatic tx/expectant management. Has established urologic care. Return precautions discussed.     Virgel Manifold, MD 03/14/14 1504

## 2014-04-24 ENCOUNTER — Ambulatory Visit: Payer: Medicaid Other | Attending: Urology | Admitting: Physical Therapy

## 2014-04-24 DIAGNOSIS — M629 Disorder of muscle, unspecified: Secondary | ICD-10-CM | POA: Diagnosis not present

## 2014-04-24 DIAGNOSIS — IMO0001 Reserved for inherently not codable concepts without codable children: Secondary | ICD-10-CM | POA: Diagnosis present

## 2014-04-24 DIAGNOSIS — R350 Frequency of micturition: Secondary | ICD-10-CM | POA: Diagnosis not present

## 2014-04-24 DIAGNOSIS — M2420 Disorder of ligament, unspecified site: Secondary | ICD-10-CM | POA: Insufficient documentation

## 2014-04-30 DIAGNOSIS — N319 Neuromuscular dysfunction of bladder, unspecified: Secondary | ICD-10-CM | POA: Insufficient documentation

## 2014-08-04 ENCOUNTER — Encounter (HOSPITAL_COMMUNITY): Payer: Self-pay | Admitting: Pharmacist

## 2014-08-12 ENCOUNTER — Encounter (HOSPITAL_COMMUNITY): Payer: Self-pay

## 2014-08-12 ENCOUNTER — Encounter (HOSPITAL_COMMUNITY)
Admission: RE | Admit: 2014-08-12 | Discharge: 2014-08-12 | Disposition: A | Discharge status: Home / Self Care | Payer: Medicare Other | Source: Ambulatory Visit | Attending: Obstetrics & Gynecology | Admitting: Obstetrics & Gynecology

## 2014-08-12 DIAGNOSIS — N949 Unspecified condition associated with female genital organs and menstrual cycle: Secondary | ICD-10-CM | POA: Insufficient documentation

## 2014-08-12 DIAGNOSIS — Z01818 Encounter for other preprocedural examination: Secondary | ICD-10-CM | POA: Diagnosis present

## 2014-08-12 DIAGNOSIS — N925 Other specified irregular menstruation: Secondary | ICD-10-CM | POA: Diagnosis not present

## 2014-08-12 DIAGNOSIS — N938 Other specified abnormal uterine and vaginal bleeding: Secondary | ICD-10-CM | POA: Diagnosis present

## 2014-08-12 HISTORY — DX: Nonrheumatic mitral (valve) prolapse: I34.1

## 2014-08-12 HISTORY — DX: Gastro-esophageal reflux disease without esophagitis: K21.9

## 2014-08-12 HISTORY — DX: Anemia, unspecified: D64.9

## 2014-08-12 HISTORY — DX: Headache: R51

## 2014-08-12 LAB — CBC
HCT: 38.3 % (ref 36.0–46.0)
Hemoglobin: 12.3 g/dL (ref 12.0–15.0)
MCH: 26.7 pg (ref 26.0–34.0)
MCHC: 32.1 g/dL (ref 30.0–36.0)
MCV: 83.1 fL (ref 78.0–100.0)
Platelets: 199 10*3/uL (ref 150–400)
RBC: 4.61 MIL/uL (ref 3.87–5.11)
RDW: 14.6 % (ref 11.5–15.5)
WBC: 7.9 10*3/uL (ref 4.0–10.5)

## 2014-08-12 NOTE — Patient Instructions (Signed)
Your procedure is scheduled on:08/15/14  Enter through the Main Entrance at :6am Pick up desk phone and dial [phone] and inform us of your arrival.  Please call [phone] if you have any problems the morning of surgery.  Remember: Do not eat food or drink liquids, including water, after midnight:Thursday    You may brush your teeth the morning of surgery.    DO NOT wear jewelry, eye make-up, lipstick,body lotion, or dark fingernail polish.  (Polished toes are ok) You may wear deodorant.  If you are to be admitted after surgery, leave suitcase in car until your room has been assigned. Patients discharged on the day of surgery will not be allowed to drive home. Wear loose fitting, comfortable clothes for your ride home.

## 2014-08-14 ENCOUNTER — Other Ambulatory Visit: Payer: Self-pay | Admitting: Obstetrics & Gynecology

## 2014-08-15 ENCOUNTER — Encounter (HOSPITAL_COMMUNITY): Payer: Medicare Other | Admitting: Anesthesiology

## 2014-08-15 ENCOUNTER — Inpatient Hospital Stay (HOSPITAL_COMMUNITY): Payer: Medicare Other | Admitting: Anesthesiology

## 2014-08-15 ENCOUNTER — Inpatient Hospital Stay (HOSPITAL_COMMUNITY)
Admission: RE | Admit: 2014-08-15 | Discharge: 2014-08-18 | DRG: 743 | Disposition: A | Discharge status: Home / Self Care | Payer: Medicare Other | Source: Ambulatory Visit | Attending: Obstetrics & Gynecology | Admitting: Obstetrics & Gynecology

## 2014-08-15 ENCOUNTER — Encounter (HOSPITAL_COMMUNITY): Payer: Self-pay | Admitting: *Deleted

## 2014-08-15 ENCOUNTER — Encounter (HOSPITAL_COMMUNITY)
Admission: RE | Disposition: A | Discharge status: Home / Self Care | Payer: Self-pay | Source: Ambulatory Visit | Attending: Obstetrics & Gynecology

## 2014-08-15 DIAGNOSIS — N831 Corpus luteum cyst of ovary, unspecified side: Secondary | ICD-10-CM | POA: Diagnosis present

## 2014-08-15 DIAGNOSIS — K219 Gastro-esophageal reflux disease without esophagitis: Secondary | ICD-10-CM | POA: Diagnosis present

## 2014-08-15 DIAGNOSIS — Z90710 Acquired absence of both cervix and uterus: Secondary | ICD-10-CM | POA: Diagnosis present

## 2014-08-15 DIAGNOSIS — D252 Subserosal leiomyoma of uterus: Secondary | ICD-10-CM | POA: Diagnosis present

## 2014-08-15 DIAGNOSIS — D250 Submucous leiomyoma of uterus: Secondary | ICD-10-CM | POA: Diagnosis present

## 2014-08-15 DIAGNOSIS — N938 Other specified abnormal uterine and vaginal bleeding: Principal | ICD-10-CM | POA: Diagnosis present

## 2014-08-15 DIAGNOSIS — N949 Unspecified condition associated with female genital organs and menstrual cycle: Principal | ICD-10-CM | POA: Diagnosis present

## 2014-08-15 HISTORY — PX: TOTAL ABDOMINAL HYSTERECTOMY: SHX209

## 2014-08-15 HISTORY — PX: ABDOMINAL HYSTERECTOMY: SHX81

## 2014-08-15 LAB — CBC
HCT: 31.8 % — ABNORMAL LOW (ref 36.0–46.0)
Hemoglobin: 10.3 g/dL — ABNORMAL LOW (ref 12.0–15.0)
MCH: 27.2 pg (ref 26.0–34.0)
MCHC: 32.4 g/dL (ref 30.0–36.0)
MCV: 84.1 fL (ref 78.0–100.0)
Platelets: 153 10*3/uL (ref 150–400)
RBC: 3.78 MIL/uL — ABNORMAL LOW (ref 3.87–5.11)
RDW: 14.1 % (ref 11.5–15.5)
WBC: 12.5 10*3/uL — ABNORMAL HIGH (ref 4.0–10.5)

## 2014-08-15 LAB — PREGNANCY, URINE: Preg Test, Ur: NEGATIVE

## 2014-08-15 LAB — PREPARE RBC (CROSSMATCH)

## 2014-08-15 SURGERY — HYSTERECTOMY, ABDOMINAL
Anesthesia: General

## 2014-08-15 MED ORDER — MIDAZOLAM HCL 2 MG/2ML IJ SOLN
INTRAMUSCULAR | Status: AC
  Filled 2014-08-15: qty 2

## 2014-08-15 MED ORDER — MIDAZOLAM HCL 2 MG/2ML IJ SOLN
INTRAMUSCULAR | Status: DC | PRN
  Administered 2014-08-15: 2 mg via INTRAVENOUS

## 2014-08-15 MED ORDER — LIDOCAINE HCL (CARDIAC) 20 MG/ML IV SOLN
INTRAVENOUS | Status: AC
  Filled 2014-08-15: qty 5

## 2014-08-15 MED ORDER — ONDANSETRON HCL 4 MG/2ML IJ SOLN
INTRAMUSCULAR | Status: DC | PRN
  Administered 2014-08-15: 4 mg via INTRAVENOUS

## 2014-08-15 MED ORDER — FENTANYL CITRATE 0.05 MG/ML IJ SOLN
INTRAMUSCULAR | Status: AC
  Filled 2014-08-15: qty 5

## 2014-08-15 MED ORDER — HYDROMORPHONE HCL PF 1 MG/ML IJ SOLN
INTRAMUSCULAR | Status: DC | PRN
  Administered 2014-08-15 (×2): 1 mg via INTRAVENOUS

## 2014-08-15 MED ORDER — SODIUM CHLORIDE 0.9 % IV SOLN: Freq: Once | INTRAVENOUS | Status: DC

## 2014-08-15 MED ORDER — NALOXONE HCL 0.4 MG/ML IJ SOLN: 0.4000 mg | INTRAMUSCULAR | Status: DC | PRN

## 2014-08-15 MED ORDER — ONDANSETRON HCL 4 MG/2ML IJ SOLN: 4.0000 mg | Freq: Four times a day (QID) | INTRAMUSCULAR | Status: DC | PRN

## 2014-08-15 MED ORDER — ROCURONIUM BROMIDE 100 MG/10ML IV SOLN
INTRAVENOUS | Status: AC
Start: 2014-08-15 — End: 2014-08-15
  Filled 2014-08-15: qty 1

## 2014-08-15 MED ORDER — SODIUM CHLORIDE 0.9 % IJ SOLN
INTRAMUSCULAR | Status: AC
  Filled 2014-08-15: qty 50

## 2014-08-15 MED ORDER — ZOLPIDEM TARTRATE 5 MG PO TABS: 5.0000 mg | ORAL_TABLET | Freq: Every evening | ORAL | Status: DC | PRN

## 2014-08-15 MED ORDER — HYDROMORPHONE HCL PF 1 MG/ML IJ SOLN
0.2500 mg | INTRAMUSCULAR | Status: DC | PRN
  Administered 2014-08-15 (×4): 0.5 mg via INTRAVENOUS

## 2014-08-15 MED ORDER — OXYCODONE-ACETAMINOPHEN 5-325 MG PO TABS
1.0000 | ORAL_TABLET | ORAL | Status: DC | PRN
  Administered 2014-08-16 (×3): 2 via ORAL
  Filled 2014-08-15 (×4): qty 2

## 2014-08-15 MED ORDER — ACETAMINOPHEN 160 MG/5ML PO SOLN
975.0000 mg | Freq: Once | ORAL | Status: AC
  Administered 2014-08-15: 975 mg via ORAL

## 2014-08-15 MED ORDER — GLYCOPYRROLATE 0.2 MG/ML IJ SOLN: INTRAMUSCULAR | Status: DC | PRN

## 2014-08-15 MED ORDER — NEOSTIGMINE METHYLSULFATE 10 MG/10ML IV SOLN
INTRAVENOUS | Status: AC
  Filled 2014-08-15: qty 1

## 2014-08-15 MED ORDER — NEOSTIGMINE METHYLSULFATE 10 MG/10ML IV SOLN
INTRAVENOUS | Status: DC | PRN
  Administered 2014-08-15: 3 mg via INTRAVENOUS

## 2014-08-15 MED ORDER — SCOPOLAMINE 1 MG/3DAYS TD PT72
MEDICATED_PATCH | TRANSDERMAL | Status: AC
  Administered 2014-08-15: 1.5 mg via TRANSDERMAL
  Filled 2014-08-15: qty 1

## 2014-08-15 MED ORDER — FENTANYL CITRATE 0.05 MG/ML IJ SOLN
INTRAMUSCULAR | Status: DC | PRN
  Administered 2014-08-15: 50 ug via INTRAVENOUS
  Administered 2014-08-15: 100 ug via INTRAVENOUS
  Administered 2014-08-15 (×2): 50 ug via INTRAVENOUS
  Administered 2014-08-15: 100 ug via INTRAVENOUS
  Administered 2014-08-15 (×3): 50 ug via INTRAVENOUS
  Administered 2014-08-15: 100 ug via INTRAVENOUS

## 2014-08-15 MED ORDER — PANTOPRAZOLE SODIUM 40 MG IV SOLR
40.0000 mg | Freq: Every day | INTRAVENOUS | Status: DC
  Administered 2014-08-15 – 2014-08-16 (×2): 40 mg via INTRAVENOUS
  Filled 2014-08-15 (×3): qty 40

## 2014-08-15 MED ORDER — HYDROMORPHONE 0.3 MG/ML IV SOLN
INTRAVENOUS | Status: DC
  Administered 2014-08-15: 5 mL via INTRAVENOUS
  Administered 2014-08-15: 2.1 mg via INTRAVENOUS
  Administered 2014-08-15: 14:00:00 via INTRAVENOUS
  Administered 2014-08-16: 0.3 mg via INTRAVENOUS
  Administered 2014-08-16: 0.9 mg via INTRAVENOUS
  Filled 2014-08-15: qty 25

## 2014-08-15 MED ORDER — SCOPOLAMINE 1 MG/3DAYS TD PT72
1.0000 | MEDICATED_PATCH | Freq: Once | TRANSDERMAL | Status: AC
  Administered 2014-08-15: 1.5 mg via TRANSDERMAL

## 2014-08-15 MED ORDER — ACETAMINOPHEN 160 MG/5ML PO SOLN
ORAL | Status: AC
  Administered 2014-08-15: 975 mg via ORAL
  Filled 2014-08-15: qty 40.6

## 2014-08-15 MED ORDER — EPHEDRINE 5 MG/ML INJ
INTRAVENOUS | Status: AC
  Filled 2014-08-15: qty 10

## 2014-08-15 MED ORDER — STERILE WATER FOR IRRIGATION IR SOLN
Status: DC | PRN
  Administered 2014-08-15: 1000 mL

## 2014-08-15 MED ORDER — HYDROMORPHONE HCL PF 1 MG/ML IJ SOLN
INTRAMUSCULAR | Status: AC
  Filled 2014-08-15: qty 1

## 2014-08-15 MED ORDER — BUPIVACAINE LIPOSOME 1.3 % IJ SUSP
INTRAMUSCULAR | Status: DC | PRN
  Administered 2014-08-15: 20 mL

## 2014-08-15 MED ORDER — GLYCOPYRROLATE 0.2 MG/ML IJ SOLN
INTRAMUSCULAR | Status: AC
  Filled 2014-08-15: qty 3

## 2014-08-15 MED ORDER — DOCUSATE SODIUM 100 MG PO CAPS
100.0000 mg | ORAL_CAPSULE | Freq: Two times a day (BID) | ORAL | Status: DC
  Administered 2014-08-16 – 2014-08-17 (×4): 100 mg via ORAL
  Filled 2014-08-15 (×6): qty 1

## 2014-08-15 MED ORDER — GENTAMICIN SULFATE 40 MG/ML IJ SOLN
INTRAVENOUS | Status: AC
  Administered 2014-08-15: 117 mL via INTRAVENOUS
  Filled 2014-08-15: qty 10.25

## 2014-08-15 MED ORDER — HYDROMORPHONE HCL PF 1 MG/ML IJ SOLN
INTRAMUSCULAR | Status: AC
  Administered 2014-08-15: 0.5 mg via INTRAVENOUS
  Filled 2014-08-15: qty 1

## 2014-08-15 MED ORDER — LACTATED RINGERS IV SOLN
INTRAVENOUS | Status: DC
  Administered 2014-08-15 (×5): via INTRAVENOUS

## 2014-08-15 MED ORDER — PROPOFOL 10 MG/ML IV EMUL
INTRAVENOUS | Status: AC
  Filled 2014-08-15: qty 20

## 2014-08-15 MED ORDER — DIPHENHYDRAMINE HCL 50 MG/ML IJ SOLN: 12.5000 mg | Freq: Four times a day (QID) | INTRAMUSCULAR | Status: DC | PRN

## 2014-08-15 MED ORDER — DEXAMETHASONE SODIUM PHOSPHATE 10 MG/ML IJ SOLN
INTRAMUSCULAR | Status: DC | PRN
  Administered 2014-08-15: 4 mg via INTRAVENOUS

## 2014-08-15 MED ORDER — LIDOCAINE HCL (CARDIAC) 20 MG/ML IV SOLN
INTRAVENOUS | Status: DC | PRN
  Administered 2014-08-15: 60 mg via INTRAVENOUS

## 2014-08-15 MED ORDER — DEXAMETHASONE SODIUM PHOSPHATE 10 MG/ML IJ SOLN
INTRAMUSCULAR | Status: AC
  Filled 2014-08-15: qty 1

## 2014-08-15 MED ORDER — BUPIVACAINE LIPOSOME 1.3 % IJ SUSP
20.0000 mL | Freq: Once | INTRAMUSCULAR | Status: DC
  Filled 2014-08-15: qty 20

## 2014-08-15 MED ORDER — ROCURONIUM BROMIDE 100 MG/10ML IV SOLN
INTRAVENOUS | Status: DC | PRN
  Administered 2014-08-15: 20 mg via INTRAVENOUS
  Administered 2014-08-15: 10 mg via INTRAVENOUS
  Administered 2014-08-15: 50 mg via INTRAVENOUS
  Administered 2014-08-15 (×2): 10 mg via INTRAVENOUS
  Administered 2014-08-15: 20 mg via INTRAVENOUS

## 2014-08-15 MED ORDER — MENTHOL 3 MG MT LOZG
1.0000 | LOZENGE | OROMUCOSAL | Status: DC | PRN
  Filled 2014-08-15: qty 9

## 2014-08-15 MED ORDER — GLYCOPYRROLATE 0.2 MG/ML IJ SOLN
INTRAMUSCULAR | Status: DC | PRN
  Administered 2014-08-15: 0.6 mg via INTRAVENOUS
  Administered 2014-08-15: 0.2 mg via INTRAVENOUS

## 2014-08-15 MED ORDER — ONDANSETRON HCL 4 MG PO TABS: 4.0000 mg | ORAL_TABLET | Freq: Four times a day (QID) | ORAL | Status: DC | PRN

## 2014-08-15 MED ORDER — LACTATED RINGERS IV SOLN
INTRAVENOUS | Status: DC
  Administered 2014-08-15 – 2014-08-16 (×3): via INTRAVENOUS

## 2014-08-15 MED ORDER — SODIUM CHLORIDE 0.9 % IJ SOLN
INTRAMUSCULAR | Status: DC | PRN
  Administered 2014-08-15: 40 mL

## 2014-08-15 MED ORDER — FENTANYL CITRATE 0.05 MG/ML IJ SOLN
INTRAMUSCULAR | Status: AC
  Filled 2014-08-15: qty 2

## 2014-08-15 MED ORDER — INFLUENZA VAC SPLIT QUAD 0.5 ML IM SUSY
0.5000 mL | PREFILLED_SYRINGE | INTRAMUSCULAR | Status: AC
  Administered 2014-08-16: 0.5 mL via INTRAMUSCULAR
  Filled 2014-08-15: qty 0.5

## 2014-08-15 MED ORDER — SODIUM CHLORIDE 0.9 % IV SOLN
INTRAVENOUS | Status: DC | PRN
  Administered 2014-08-15: 10:00:00 via INTRAVENOUS

## 2014-08-15 MED ORDER — EPHEDRINE SULFATE 50 MG/ML IJ SOLN
INTRAMUSCULAR | Status: DC | PRN
  Administered 2014-08-15: 10 mg via INTRAVENOUS

## 2014-08-15 MED ORDER — ACETAMINOPHEN 325 MG PO TABS: 650.0000 mg | ORAL_TABLET | ORAL | Status: DC | PRN

## 2014-08-15 MED ORDER — SIMETHICONE 80 MG PO CHEW
80.0000 mg | CHEWABLE_TABLET | Freq: Four times a day (QID) | ORAL | Status: DC
  Administered 2014-08-15 – 2014-08-17 (×10): 80 mg via ORAL
  Filled 2014-08-15 (×16): qty 1

## 2014-08-15 MED ORDER — PROPOFOL 10 MG/ML IV BOLUS
INTRAVENOUS | Status: DC | PRN
  Administered 2014-08-15: 200 mg via INTRAVENOUS

## 2014-08-15 MED ORDER — DIPHENHYDRAMINE HCL 12.5 MG/5ML PO ELIX
12.5000 mg | ORAL_SOLUTION | Freq: Four times a day (QID) | ORAL | Status: DC | PRN
  Filled 2014-08-15: qty 5

## 2014-08-15 MED ORDER — ONDANSETRON HCL 4 MG/2ML IJ SOLN
INTRAMUSCULAR | Status: AC
  Filled 2014-08-15: qty 2

## 2014-08-15 MED ORDER — SODIUM CHLORIDE 0.9 % IJ SOLN: 9.0000 mL | INTRAMUSCULAR | Status: DC | PRN

## 2014-08-15 SURGICAL SUPPLY — 45 items
BENZOIN TINCTURE PRP APPL 2/3 (GAUZE/BANDAGES/DRESSINGS) ×3 IMPLANT
BLADE SURG 10 STRL SS (BLADE) ×6 IMPLANT
CANISTER SUCT 3000ML (MISCELLANEOUS) ×3 IMPLANT
CLOSURE WOUND 1/2 X4 (GAUZE/BANDAGES/DRESSINGS) ×1
CLOTH BEACON ORANGE TIMEOUT ST (SAFETY) ×3 IMPLANT
CONT PATH 16OZ SNAP LID 3702 (MISCELLANEOUS) ×3 IMPLANT
DECANTER SPIKE VIAL GLASS SM (MISCELLANEOUS) ×3 IMPLANT
DRAPE WARM FLUID 44X44 (DRAPE) IMPLANT
DRSG OPSITE POSTOP 4X10 (GAUZE/BANDAGES/DRESSINGS) ×3 IMPLANT
DURAPREP 26ML APPLICATOR (WOUND CARE) ×3 IMPLANT
GLOVE BIO SURGEON STRL SZ7 (GLOVE) ×3 IMPLANT
GLOVE BIOGEL PI IND STRL 7.0 (GLOVE) ×2 IMPLANT
GLOVE BIOGEL PI INDICATOR 7.0 (GLOVE) ×4
GOWN STRL REUS W/TWL LRG LVL3 (GOWN DISPOSABLE) ×9 IMPLANT
NEEDLE HYPO 22GX1.5 SAFETY (NEEDLE) IMPLANT
NEEDLE HYPO 25X1 1.5 SAFETY (NEEDLE) IMPLANT
PACK ABDOMINAL GYN (CUSTOM PROCEDURE TRAY) ×3 IMPLANT
PAD ABD 8X7 1/2 STERILE (GAUZE/BANDAGES/DRESSINGS) ×3 IMPLANT
PAD OB MATERNITY 4.3X12.25 (PERSONAL CARE ITEMS) ×3 IMPLANT
PROTECTOR NERVE ULNAR (MISCELLANEOUS) ×3 IMPLANT
RTRCTR C-SECT PINK 25CM LRG (MISCELLANEOUS) ×3 IMPLANT
SET CYSTO W/LG BORE CLAMP LF (SET/KITS/TRAYS/PACK) ×3 IMPLANT
SPONGE GAUZE 4X4 12PLY STER LF (GAUZE/BANDAGES/DRESSINGS) ×3 IMPLANT
SPONGE LAP 18X18 X RAY DECT (DISPOSABLE) ×6 IMPLANT
STRIP CLOSURE SKIN 1/2X4 (GAUZE/BANDAGES/DRESSINGS) ×2 IMPLANT
SURGIFLO W/THROMBIN 8M KIT (HEMOSTASIS) ×3 IMPLANT
SUT CHROMIC 2 0 CT 1 (SUTURE) IMPLANT
SUT CHROMIC 2 0 CT 36 (SUTURE) ×3 IMPLANT
SUT CHROMIC 2 0 TIES 18 (SUTURE) IMPLANT
SUT PDS AB 0 CT 36 (SUTURE) ×6 IMPLANT
SUT PLAIN 2 0 (SUTURE)
SUT PLAIN 2 0 XLH (SUTURE) IMPLANT
SUT PLAIN ABS 2-0 54XMFL TIE (SUTURE) IMPLANT
SUT SILK 2 0 SH (SUTURE) IMPLANT
SUT VIC AB 0 CT1 27 (SUTURE) ×12
SUT VIC AB 0 CT1 27XBRD ANBCTR (SUTURE) ×2 IMPLANT
SUT VIC AB 0 CT1 27XCR 8 STRN (SUTURE) ×4 IMPLANT
SUT VIC AB 2-0 CT1 (SUTURE) IMPLANT
SUT VIC AB 4-0 KS 27 (SUTURE) ×3 IMPLANT
SUT VICRYL 0 TIES 12 18 (SUTURE) ×3 IMPLANT
SYRINGE CONTROL L 12CC (SYRINGE) IMPLANT
TAPE CLOTH SURG 4X10 WHT LF (GAUZE/BANDAGES/DRESSINGS) ×3 IMPLANT
TOWEL OR 17X24 6PK STRL BLUE (TOWEL DISPOSABLE) ×6 IMPLANT
TRAY FOLEY CATH 14FR (SET/KITS/TRAYS/PACK) ×3 IMPLANT
WATER STERILE IRR 1000ML POUR (IV SOLUTION) ×6 IMPLANT

## 2014-08-15 NOTE — Transfer of Care (Signed)
Immediate Anesthesia Transfer of Care Note  Patient: Samantha [name]  Procedure(s) Performed: Procedure(s): HYSTERECTOMY ABDOMINAL WITH CYSTOSCOPY (N/A)  Patient Location: PACU  Anesthesia Type:General  Level of Consciousness: awake, alert  and oriented  Airway & Oxygen Therapy: Patient Spontanous Breathing and Patient connected to face mask oxygen  Post-op Assessment: Report given to PACU RN and Post -op Vital signs reviewed and stable  Post vital signs: Reviewed and stable  Complications: No apparent anesthesia complications

## 2014-08-15 NOTE — Anesthesia Postprocedure Evaluation (Signed)
Anesthesia Post Note  Patient: Samantha [name]  Procedure(s) Performed: Procedure(s) (LRB): HYSTERECTOMY ABDOMINAL WITH CYSTOSCOPY (N/A)  Anesthesia type: General  Patient location: PACU  Post pain: Pain level controlled  Post assessment: Post-op Vital signs reviewed  Last Vitals:  Filed Vitals:   08/15/14 1300  BP: 156/80  Pulse: 98  Temp: 36.9 C  Resp: 18    Post vital signs: Reviewed  Level of consciousness: sedated  Complications: No apparent anesthesia complications

## 2014-08-15 NOTE — Anesthesia Postprocedure Evaluation (Signed)
Anesthesia Post Note  Patient: Samantha [name]  Procedure(s) Performed: Procedure(s): HYSTERECTOMY ABDOMINAL WITH CYSTOSCOPY (N/A)  Anesthesia type: General  Patient location: [facility] Unit  Post pain: Pain level controlled  Post assessment: Post-op Vital signs reviewed  Last Vitals: BP 129/71  Pulse 97  Temp(Src) 36.8 C (Oral)  Resp 10  Ht 5\' 9"  (1.753 m)  Wt 230 lb (104.327 kg)  BMI 33.95 kg/m2  SpO2 89%  Post vital signs: Reviewed  Level of consciousness: awake  Complications: No apparent anesthesia complications

## 2014-08-15 NOTE — Anesthesia Preprocedure Evaluation (Addendum)
Anesthesia Evaluation  Patient identified by MRN, date of birth, ID band Patient awake    Reviewed: Allergy & Precautions, H&P , Patient's Chart, lab work & pertinent test results, reviewed documented beta blocker date and time   Airway Mallampati: III TM Distance: >3 FB Neck ROM: full    Dental no notable dental hx.    Pulmonary  breath sounds clear to auscultation  Pulmonary exam normal       Cardiovascular Rhythm:regular Rate:Normal     Neuro/Psych Seizures -, Well Controlled,     GI/Hepatic   Endo/Other    Renal/GU      Musculoskeletal   Abdominal   Peds  Hematology   Anesthesia Other Findings   Reproductive/Obstetrics                          Anesthesia Physical Anesthesia Plan  ASA: II  Anesthesia Plan: General   Post-op Pain Management:    Induction: Intravenous  Airway Management Planned: Oral ETT  Additional Equipment:   Intra-op Plan:   Post-operative Plan: Extubation in OR  Informed Consent: I have reviewed the patients History and Physical, chart, labs and discussed the procedure including the risks, benefits and alternatives for the proposed anesthesia with the patient or authorized representative who has indicated his/her understanding and acceptance.   Dental Advisory Given and Dental advisory given  Plan Discussed with: CRNA and Surgeon  Anesthesia Plan Comments: (  Discussed general anesthesia, including possible nausea, instrumentation of airway, sore throat,pulmonary aspiration, etc. I asked if the were any outstanding questions, or  concerns before we proceeded. )        Anesthesia Quick Evaluation

## 2014-08-15 NOTE — Addendum Note (Signed)
Addendum created 08/15/14 1657 by Flossie Dibble, CRNA   Modules edited: Notes Section   Notes Section:  File: [id number]

## 2014-08-15 NOTE — H&P (Signed)
Samantha [name] is an 33 y.o. female with h/o pain and abnormal uterine bleeding presents  For scheduled hysterectomy. Pelvic ultrasound in office showed no structural  Abnormalities ie polyps or fibroids.  Pertinent Gynecological History: Menses: flow is excessive with use of 3-4 pads or tampons on heaviest days Bleeding: dysfunctional uterine bleeding Contraception: tubal ligation DES exposure: denies Blood transfusions: none Sexually transmitted diseases: no past history Previous GYN Procedures: c-section / tubal  Last mammogram: n/a  Last pap: normal Date: 2015 OB History: G4, P2   Menstrual History: Menarche age: 37  No LMP recorded.    Past Medical History  Diagnosis Date  . Depression   . Seizures     non epileptic events per epilepsy monitoring unit  . Seizures     last seizure July 2015- does not convulse but has a sleep effect  . MVP (mitral valve prolapse)     as a child  . GERD (gastroesophageal reflux disease)     no meds  . Anemia   . Headache(784.0)     tension and with anxiety    Past Surgical History  Procedure Laterality Date  . Tubal ligation    . Cesarean section N/A     No family history on file.  Social History:  reports that she has never smoked. She does not have any smokeless tobacco history on file. She reports that she does not drink alcohol or use illicit drugs.  Allergies:  Allergies  Allergen Reactions  . Sulfamethoxazole-Tmp Ds Other (See Comments)    Tongue swelling  . Penicillins Rash    Prescriptions prior to admission  Medication Sig Dispense Refill  . ARIPiprazole (ABILIFY) 5 MG tablet Take 5 mg by mouth daily after supper.      . valACYclovir (VALTREX) 1000 MG tablet Take 500 mg by mouth daily.      . DULoxetine (CYMBALTA) 30 MG capsule Take 30 mg by mouth daily after supper.         Review of Systems  Constitutional: Negative for fever and chills.  Eyes: Negative for blurred vision.  Respiratory: Negative for cough.    Cardiovascular: Negative for chest pain.  Gastrointestinal: Negative for heartburn and nausea.  Genitourinary: Negative for dysuria.  Musculoskeletal: Negative for myalgias.  Skin: Negative for itching and rash.  Neurological: Negative for dizziness, tingling and headaches.  Endo/Heme/Allergies: Negative for environmental allergies. Does not bruise/bleed easily.  Psychiatric/Behavioral: Negative for depression.    Blood pressure 138/81, pulse 81, temperature 97.9 F (36.6 C), temperature source Oral, resp. rate 18, height 5\' 9"  (1.753 m), weight 104.327 kg (230 lb), SpO2 97.00%. Physical Exam  Nursing note and vitals reviewed. Constitutional: She is oriented to person, place, and time. She appears well-developed and well-nourished.  HENT:  Head: Normocephalic.  Eyes: Pupils are equal, round, and reactive to light.  Neck: Normal range of motion.  Cardiovascular: Normal rate and normal heart sounds.   GI: Soft. She exhibits no distension. There is no tenderness. There is no rebound.  Musculoskeletal: Normal range of motion.  Neurological: She is alert and oriented to person, place, and time.  GU: Vulva: no masses, atrophy, or lesions. Bladder/Urethra: no urethral discharge or mass and normal meatus and bladder non distended. Vagina no tenderness, erythema, cystocele, rectocele, abnormal vaginal discharge, or vesicle(s) or ulcers. Cervix: no discharge or cervical motion tenderness and grossly normal. Uterus: enlarged, anteverted, and tender and mobile, normal shape, and no uterine prolapse. Adnexa/Parametria: no parametrial tenderness or mass, no ovarian  mass, and adnexal tenderness.  Right adnexal tenderness, no palpable masses bilaterally  No results found for this or any previous visit (from the past 24 hour(s)).  No results found.  Assessment/Plan: 33 yo with abnormal uterine bleeding Pt desires definitive surgical intervention.   The R/B of hysterectomy were discussed w/ the pt  to include, but not limited to bleeding/transfusion, infection, wound breakdown/poor healing, damage to organs in abd/pelvis w/ possible need for further surgical repair, blood clot, PE/MI/stroke. Pt understands these risks and consents to surgery.   Additionally, risks/benefits of ovarian preservation discussed to include 1/70 lifetime risk of ovarian cancer and 5-10% risk for future surgery after hysterectomy.   PT desires preservation of her ovaries.  To OR today 08/15/14  for TAH, bilateral salpingectomy.  Audreyanna Butkiewicz, [place] 08/15/2014, 6:51 AM

## 2014-08-15 NOTE — Anesthesia Procedure Notes (Signed)
Procedure Name: Intubation Date/Time: 08/15/2014 7:33 AM Performed by: Jonna Munro Pre-anesthesia Checklist: Suction available, Emergency Drugs available, Timeout performed, Patient identified and Patient being monitored Patient Re-evaluated:Patient Re-evaluated prior to inductionOxygen Delivery Method: Circle system utilized Preoxygenation: Pre-oxygenation with 100% oxygen Intubation Type: IV induction Ventilation: Mask ventilation without difficulty Grade View: Grade IV Tube type: Oral Tube size: 7.0 mm Number of attempts: 1 Airway Equipment and Method: Patient positioned with wedge pillow,  Stylet and Video-laryngoscopy Placement Confirmation: ETT inserted through vocal cords under direct vision,  positive ETCO2 and breath sounds checked- equal and bilateral Secured at: 22 cm Tube secured with: Tape Dental Injury: Teeth and Oropharynx as per pre-operative assessment  Difficulty Due To: Difficulty was anticipated, Difficult Airway- due to large tongue, Difficult Airway- due to reduced neck mobility and Difficult Airway- due to limited oral opening Comments: Patient with small mouth opening, large tongue, used glidescope, with atraumatic intubation.

## 2014-08-16 DIAGNOSIS — N938 Other specified abnormal uterine and vaginal bleeding: Secondary | ICD-10-CM | POA: Diagnosis present

## 2014-08-16 DIAGNOSIS — N831 Corpus luteum cyst of ovary, unspecified side: Secondary | ICD-10-CM | POA: Diagnosis present

## 2014-08-16 DIAGNOSIS — N925 Other specified irregular menstruation: Secondary | ICD-10-CM | POA: Diagnosis not present

## 2014-08-16 DIAGNOSIS — K219 Gastro-esophageal reflux disease without esophagitis: Secondary | ICD-10-CM | POA: Diagnosis present

## 2014-08-16 DIAGNOSIS — D252 Subserosal leiomyoma of uterus: Secondary | ICD-10-CM | POA: Diagnosis present

## 2014-08-16 DIAGNOSIS — D250 Submucous leiomyoma of uterus: Secondary | ICD-10-CM | POA: Diagnosis present

## 2014-08-16 DIAGNOSIS — N949 Unspecified condition associated with female genital organs and menstrual cycle: Secondary | ICD-10-CM | POA: Diagnosis present

## 2014-08-16 LAB — CBC
HCT: 28.9 % — ABNORMAL LOW (ref 36.0–46.0)
Hemoglobin: 9.1 g/dL — ABNORMAL LOW (ref 12.0–15.0)
MCH: 26.5 pg (ref 26.0–34.0)
MCHC: 31.5 g/dL (ref 30.0–36.0)
MCV: 84.3 fL (ref 78.0–100.0)
Platelets: 163 10*3/uL (ref 150–400)
RBC: 3.43 MIL/uL — ABNORMAL LOW (ref 3.87–5.11)
RDW: 14.4 % (ref 11.5–15.5)
WBC: 11.8 10*3/uL — ABNORMAL HIGH (ref 4.0–10.5)

## 2014-08-16 MED ORDER — BISACODYL 10 MG RE SUPP: 10.0000 mg | Freq: Every day | RECTAL | Status: DC | PRN

## 2014-08-16 MED ORDER — KETOROLAC TROMETHAMINE 60 MG/2ML IM SOLN
60.0000 mg | Freq: Once | INTRAMUSCULAR | Status: AC
  Administered 2014-08-16: 60 mg via INTRAMUSCULAR
  Filled 2014-08-16: qty 2

## 2014-08-16 MED ORDER — HYDROMORPHONE HCL 2 MG PO TABS
2.0000 mg | ORAL_TABLET | ORAL | Status: DC | PRN
  Administered 2014-08-16 – 2014-08-17 (×3): 4 mg via ORAL
  Filled 2014-08-16 (×3): qty 2

## 2014-08-16 MED ORDER — HYDROMORPHONE HCL PF 1 MG/ML IJ SOLN
1.0000 mg | INTRAMUSCULAR | Status: DC | PRN
  Administered 2014-08-16: 1 mg via INTRAVENOUS
  Filled 2014-08-16: qty 1

## 2014-08-16 MED ORDER — IBUPROFEN 600 MG PO TABS
600.0000 mg | ORAL_TABLET | Freq: Four times a day (QID) | ORAL | Status: DC
  Administered 2014-08-16 – 2014-08-18 (×6): 600 mg via ORAL
  Filled 2014-08-16 (×6): qty 1

## 2014-08-16 MED ORDER — DIPHENHYDRAMINE HCL 25 MG PO CAPS
25.0000 mg | ORAL_CAPSULE | Freq: Four times a day (QID) | ORAL | Status: DC | PRN
  Administered 2014-08-16: 25 mg via ORAL
  Filled 2014-08-16 (×2): qty 1

## 2014-08-16 NOTE — Progress Notes (Signed)
Dr. Ileene Rubens notified of patients unrelieved pain after multiple interventions.  Heating pad applied to abd. And orders received.

## 2014-08-16 NOTE — Progress Notes (Signed)
Patient continues to rate her pain @ "8' with increased itching with the percocet.  Patient states the percocet does not give her good relief.  Dr. Harrington Challenger notified and orders received.

## 2014-08-16 NOTE — Progress Notes (Signed)
UR completed 

## 2014-08-16 NOTE — Progress Notes (Signed)
1 Day Post-Op Procedure(s) (LRB): HYSTERECTOMY ABDOMINAL WITH CYSTOSCOPY (N/A)  Subjective: Patient reports incisional pain and tolerating PO.  She denies flatus.  She has been ambulating.   Objective: I have reviewed patient's vital signs, intake and output, medications and labs.  General: alert, cooperative and no distress Resp: clear to auscultation bilaterally Cardio: regular rate and rhythm, S1, S2 normal, no murmur, click, rub or gallop GI: normal findings: Soft, distended, appropriate tenderness to palpation. Incision with dressing clean dry intact Extremities: extremities normal, atraumatic, no cyanosis or edema, Homans sign is negative, no sign of DVT and no edema, redness or tenderness in the calves or thighs Vaginal Bleeding: minimal  Results for MAELIN, KURKOWSKI (MRN [id number]) as of 08/16/2014 10:25  Ref. Range 08/15/2014 09:30 08/15/2014 09:30 08/15/2014 10:00 08/16/2014 05:15  WBC Latest Range: 4.0-10.5 K/uL 12.5 (H)   11.8 (H)  RBC Latest Range: 3.87-5.11 MIL/uL 3.78 (L)   3.43 (L)  Hemoglobin Latest Range: 12.0-15.0 g/dL 10.3 (L)   9.1 (L)  HCT Latest Range: 36.0-46.0 % 31.8 (L)   28.9 (L)  MCV Latest Range: 78.0-100.0 fL 84.1   84.3  MCH Latest Range: 26.0-34.0 pg 27.2   26.5  MCHC Latest Range: 30.0-36.0 g/dL 32.4   31.5  RDW Latest Range: 11.5-15.5 % 14.1   14.4  Platelets Latest Range: 150-400 K/uL 153   163   Assessment: s/p Procedure(s): HYSTERECTOMY ABDOMINAL WITH CYSTOSCOPY (N/A): stable and tolerating diet  Plan: Advance diet Encourage ambulation Advance to PO medication Discontinue IV fluids Foley discontinued Patient requested abdominal binder   LOS: 1 day    Yanni Quiroa, [place] 08/16/2014, 10:25 AM

## 2014-08-17 MED ORDER — HYDROCODONE-ACETAMINOPHEN 5-325 MG PO TABS
1.0000 | ORAL_TABLET | ORAL | Status: DC | PRN
  Administered 2014-08-17 (×2): 2 via ORAL
  Administered 2014-08-17: 1 via ORAL
  Administered 2014-08-17: 2 via ORAL
  Administered 2014-08-18: 1 via ORAL
  Administered 2014-08-18: 2 via ORAL
  Filled 2014-08-17 (×3): qty 2
  Filled 2014-08-17 (×2): qty 1
  Filled 2014-08-17: qty 2

## 2014-08-17 MED ORDER — PANTOPRAZOLE SODIUM 40 MG PO TBEC
40.0000 mg | DELAYED_RELEASE_TABLET | Freq: Every day | ORAL | Status: DC
  Administered 2014-08-17: 40 mg via ORAL
  Filled 2014-08-17: qty 1

## 2014-08-17 MED ORDER — MAGNESIUM HYDROXIDE 400 MG/5ML PO SUSP
30.0000 mL | Freq: Once | ORAL | Status: AC
  Administered 2014-08-17: 30 mL via ORAL
  Filled 2014-08-17: qty 30

## 2014-08-17 MED ORDER — HYDROMORPHONE HCL 2 MG PO TABS: 2.0000 mg | ORAL_TABLET | ORAL | Status: DC | PRN

## 2014-08-17 MED ORDER — HYDROCODONE-ACETAMINOPHEN 7.5-325 MG PO TABS: 2.0000 | ORAL_TABLET | ORAL | Status: DC

## 2014-08-17 NOTE — Progress Notes (Signed)
Abdominal binder applied per patients request.

## 2014-08-17 NOTE — Progress Notes (Signed)
2 Days Post-Op Procedure(s) (LRB): HYSTERECTOMY ABDOMINAL WITH CYSTOSCOPY (N/A)  Subjective: Patient reports incisional pain, tolerating PO, + flatus and no problems voiding.   Her pain was not well controlled yesterday Dilaudid prn breakthrough pain was added. Today patient notes her pain is somewhat better than yesterday. She is passing gas,  No bowel movements. She denies nausea or vomiting and is ambulating.   Objective: I have reviewed patient's vital signs, intake and output, medications and labs.  General: alert, cooperative and no distress Resp: clear to auscultation bilaterally Cardio: regular rate and rhythm, S1, S2 normal, no murmur, click, rub or gallop GI: soft, mild distention, appropriately tender; hypoactive bowel sounds; incision: clean, dry and intact Extremities: edema none, Homans sign is negative, no sign of DVT and no edema, redness or tenderness in the calves or thighs Vaginal Bleeding: none  Assessment: s/p Procedure(s): HYSTERECTOMY ABDOMINAL WITH CYSTOSCOPY (N/A): stable and tolerating diet  Plan: Will discontinue all prn  IV medication Will discontinue percocet and prescribe Norco which has more hydromorphone.  Dilaudid prn breakthrough pain Milk of magnesia x 1 dose to induce bowel movement.  Continue motrin as scheduled dose.    LOS: 2 days    Isao Seltzer, [place] 08/17/2014, 9:49 AM

## 2014-08-17 NOTE — Progress Notes (Signed)
Abdominal binder removed per patient and significant other d.t increased pain.

## 2014-08-18 ENCOUNTER — Encounter (HOSPITAL_COMMUNITY): Payer: Self-pay | Admitting: Obstetrics & Gynecology

## 2014-08-18 MED ORDER — HYDROCODONE-ACETAMINOPHEN 5-325 MG PO TABS: 1.0000 | ORAL_TABLET | ORAL | Status: DC | PRN

## 2014-08-18 MED ORDER — DSS 100 MG PO CAPS: 100.0000 mg | ORAL_CAPSULE | Freq: Every day | ORAL | Status: DC | PRN

## 2014-08-18 MED ORDER — HYDROMORPHONE HCL 2 MG PO TABS: 2.0000 mg | ORAL_TABLET | ORAL | Status: DC | PRN

## 2014-08-18 MED ORDER — IBUPROFEN 600 MG PO TABS: 600.0000 mg | ORAL_TABLET | Freq: Four times a day (QID) | ORAL | Status: DC | PRN

## 2014-08-18 NOTE — Progress Notes (Signed)
Pt verbalizes understanding of d/c instructions, medications, follow up information, and when to seek medical attention. Pt has no questions at this time. No IV at this time. Pt received d/c papers and prescriptions. Pt d/c via wheelchair accompanied by her husband, child and myself. Her husband is driving her home. Samantha [name]

## 2014-08-18 NOTE — Progress Notes (Signed)
3 Days Post-Op Procedure(s) (LRB): HYSTERECTOMY ABDOMINAL WITH CYSTOSCOPY (N/A)  Subjective: Patient reports incisional pain, tolerating PO, + flatus, + BM and no problems voiding.    Objective: I have reviewed patient's vital signs, intake and output, medications and labs.  General: alert, cooperative and no distress Resp: clear to auscultation bilaterally Cardio: regular rate and rhythm, S1, S2 normal, no murmur, click, rub or gallop GI: soft, non-tender; bowel sounds normal; no masses,  no organomegaly and incision: clean, dry and intact Extremities: extremities normal, atraumatic, no cyanosis or edema, Homans sign is negative, no sign of DVT and no edema, redness or tenderness in the calves or thighs Vaginal Bleeding: none  Assessment: s/p Procedure(s): HYSTERECTOMY ABDOMINAL WITH CYSTOSCOPY (N/A): progressing well  Plan: Discharge home  LOS: 3 days    Odalis [name], [place] 08/18/2014, 8:00 AM

## 2014-08-19 LAB — TYPE AND SCREEN
ABO/RH(D): A POS
Antibody Screen: NEGATIVE
Unit division: 0
Unit division: 0
Unit division: 0
Unit division: 0

## 2014-09-04 NOTE — Op Note (Signed)
Date: 08/15/14  Pre-Operative Diagnosis: Abnormal uterine bleeding  Postoperative diagnoses: same as above  Procedure: Total abdominal hysterectomy, bilateral salpingectomy  Surgeon: Dr. Sanjuana Kava MD  Asst.: Samantha Gower, MD and Samantha Kick MD  Specimen: uterus with cervix,  Left ovarian cyst wall  EBL: 3428 mL  Complications: intraoperative bleeding  Anesthesia: GETA  Operative findings: Normal appearing uterus, bilateral tubes and Left ovary with simple appearing cyst otherwise normal ovaries.   Technique:  Pt was taken to operating room where general anesthesia was found to be adequate. The patient was draped and prepped in the normal sterile fashion in the supine position. A pfannenstiel was made 2 cm superior to the pubic symphysis. The incision was carried through with the bovie to the underlying layer of the fascia. The fascia was incised in the midline and the anterior abdominal wall was seperated in the midline using blunt and sharp dissection. The peritoneum was grasped between two pick-ups elevated, and entered sharply.   The pelvis was examined with the findings noted above. An Samantha [name] retractor was place into the incision and the bowel was packed away with moist laparotomy sponges. Two masterson clamps were placed on the cornua and used for retraction. The round ligaments on both sides were clamped, transected, and suture ligated with #0 vicryl. The anterior leaf of the broad ligament was incised along the bladder reflection to the midline from both sides. The bladder was gently dissected off the lower uterine segment and the cervix with a sponge stick. The ureters were identified bilaterally on the medial leaf of the broad ligament coursing well below the operative field.    The utero-ovarian ligaments on both sides were then clamped, transected and suture ligated with #0 vicryl. There was bleeding with transection of the left utero-ovarian ligament, and it  needed to be re-clamped and the suture re-enforced.  The uterine arteries were skeletonized bilaterally, clamped with the Samantha [name] clamps, transected and suture ligated with #0 vicryl. The left ovarian cyst wall was cauterized off the ovarian cortex and handed off the field to be sent to Pathology. The ovarian cortex was repaired with a running locked suture of 2-0 vicryl. There was difficulty with visualization throughout the case and persistent oozing from peritoneal edges. Anesthesia was made aware of the blood loss.  The uterosacral ligaments were clamped on both sides, transected and suture ligated in a similar fashion.   At the level of the reflection of the vagina onto the cervix, two curved Samantha [name] clamps were placed. The cervix and uterus were amputated with Samantha [name] scissors. The vaginal cuff angles were closed with a Samantha [name] stitch and the remaining vagina with figure of eight stitches of #0 vicryl Hemostasis was assured.   The pelvis was irrigated copiously with warmed normal saline. All pedicles were inspected carefully and Surgiflo was placed over the vaginal cuff and pedicle sites to ensure hemostasis. All laparotomy sponges and instruments were removed from the abdomen. The peritoneum was closed with a running stitch of 2-0 chromic. The fascia was closed with running #0 looped PDS and hemostasis was assured. The subcuticular layer was re-approximated with 2-0 plain and the skin was closed with #3 0 vicryl keith needle. Sponge, lap, needle and instrument counts were correct times two. The patient was taken to recovery in stable condition.The subcutaneous tissue was closed with interrupted stitches of 2-0 plain gut after hemostasis was achieved with the bovie and irrigation.  The skin was closed with 3-0 vicryl sub-q with a keith needle.  Benzoin, steri strips and a honeycomb bandage was placed.  Sponge, needle and instrument counts were correct x 3.  Pt tolerated the procedure well and was  transferred to the recovery room in stable condition.    Samantha [name] [name]

## 2014-09-05 DIAGNOSIS — K760 Fatty (change of) liver, not elsewhere classified: Secondary | ICD-10-CM | POA: Insufficient documentation

## 2014-11-24 DIAGNOSIS — G4733 Obstructive sleep apnea (adult) (pediatric): Secondary | ICD-10-CM | POA: Insufficient documentation

## 2014-11-24 DIAGNOSIS — G4730 Sleep apnea, unspecified: Secondary | ICD-10-CM | POA: Insufficient documentation

## 2014-11-30 ENCOUNTER — Encounter (HOSPITAL_COMMUNITY): Payer: Self-pay | Admitting: *Deleted

## 2014-11-30 ENCOUNTER — Emergency Department (HOSPITAL_COMMUNITY)
Admission: EM | Admit: 2014-11-30 | Discharge: 2014-12-01 | Disposition: A | Discharge status: Home / Self Care | Payer: Medicare Other | Attending: Emergency Medicine | Admitting: Emergency Medicine

## 2014-11-30 DIAGNOSIS — R5383 Other fatigue: Secondary | ICD-10-CM | POA: Insufficient documentation

## 2014-11-30 DIAGNOSIS — Z79899 Other long term (current) drug therapy: Secondary | ICD-10-CM | POA: Insufficient documentation

## 2014-11-30 DIAGNOSIS — R300 Dysuria: Secondary | ICD-10-CM | POA: Insufficient documentation

## 2014-11-30 DIAGNOSIS — E871 Hypo-osmolality and hyponatremia: Secondary | ICD-10-CM | POA: Insufficient documentation

## 2014-11-30 DIAGNOSIS — J029 Acute pharyngitis, unspecified: Secondary | ICD-10-CM

## 2014-11-30 DIAGNOSIS — R630 Anorexia: Secondary | ICD-10-CM | POA: Diagnosis not present

## 2014-11-30 DIAGNOSIS — R109 Unspecified abdominal pain: Secondary | ICD-10-CM | POA: Diagnosis present

## 2014-11-30 DIAGNOSIS — Z862 Personal history of diseases of the blood and blood-forming organs and certain disorders involving the immune mechanism: Secondary | ICD-10-CM | POA: Diagnosis not present

## 2014-11-30 DIAGNOSIS — Z9851 Tubal ligation status: Secondary | ICD-10-CM | POA: Diagnosis not present

## 2014-11-30 DIAGNOSIS — J020 Streptococcal pharyngitis: Secondary | ICD-10-CM | POA: Diagnosis not present

## 2014-11-30 DIAGNOSIS — R000 Tachycardia, unspecified: Secondary | ICD-10-CM | POA: Diagnosis not present

## 2014-11-30 DIAGNOSIS — Z8659 Personal history of other mental and behavioral disorders: Secondary | ICD-10-CM | POA: Diagnosis not present

## 2014-11-30 DIAGNOSIS — Z8679 Personal history of other diseases of the circulatory system: Secondary | ICD-10-CM | POA: Diagnosis not present

## 2014-11-30 DIAGNOSIS — Z880 Allergy status to penicillin: Secondary | ICD-10-CM | POA: Insufficient documentation

## 2014-11-30 DIAGNOSIS — Z90710 Acquired absence of both cervix and uterus: Secondary | ICD-10-CM | POA: Insufficient documentation

## 2014-11-30 DIAGNOSIS — Z8719 Personal history of other diseases of the digestive system: Secondary | ICD-10-CM | POA: Insufficient documentation

## 2014-11-30 LAB — CBC WITH DIFFERENTIAL/PLATELET
Basophils Absolute: 0 10*3/uL (ref 0.0–0.1)
Basophils Relative: 0 % (ref 0–1)
Eosinophils Absolute: 0.1 10*3/uL (ref 0.0–0.7)
Eosinophils Relative: 1 % (ref 0–5)
HCT: 37.8 % (ref 36.0–46.0)
Hemoglobin: 11.9 g/dL — ABNORMAL LOW (ref 12.0–15.0)
Lymphocytes Relative: 14 % (ref 12–46)
Lymphs Abs: 1.8 10*3/uL (ref 0.7–4.0)
MCH: 24.8 pg — ABNORMAL LOW (ref 26.0–34.0)
MCHC: 31.5 g/dL (ref 30.0–36.0)
MCV: 78.9 fL (ref 78.0–100.0)
Monocytes Absolute: 0.7 10*3/uL (ref 0.1–1.0)
Monocytes Relative: 5 % (ref 3–12)
Neutro Abs: 10 10*3/uL — ABNORMAL HIGH (ref 1.7–7.7)
Neutrophils Relative %: 80 % — ABNORMAL HIGH (ref 43–77)
Platelets: 204 10*3/uL (ref 150–400)
RBC: 4.79 MIL/uL (ref 3.87–5.11)
RDW: 14.5 % (ref 11.5–15.5)
WBC: 12.7 10*3/uL — ABNORMAL HIGH (ref 4.0–10.5)

## 2014-11-30 LAB — COMPREHENSIVE METABOLIC PANEL
ALT: 16 U/L (ref 0–35)
AST: 21 U/L (ref 0–37)
Albumin: 3.4 g/dL — ABNORMAL LOW (ref 3.5–5.2)
Alkaline Phosphatase: 94 U/L (ref 39–117)
Anion gap: 8 (ref 5–15)
BUN: 10 mg/dL (ref 6–23)
CO2: 27 mmol/L (ref 19–32)
Calcium: 8.9 mg/dL (ref 8.4–10.5)
Chloride: 97 mEq/L (ref 96–112)
Creatinine, Ser: 0.73 mg/dL (ref 0.50–1.10)
GFR calc Af Amer: >90 mL/min (ref >90)
GFR calc non Af Amer: >90 mL/min (ref >90)
Glucose, Bld: 184 mg/dL — ABNORMAL HIGH (ref 70–99)
Potassium: 3.8 mmol/L (ref 3.5–5.1)
Sodium: 132 mmol/L — ABNORMAL LOW (ref 135–145)
Total Bilirubin: 0.2 mg/dL — ABNORMAL LOW (ref 0.3–1.2)
Total Protein: 7.2 g/dL (ref 6.0–8.3)

## 2014-11-30 LAB — URINE MICROSCOPIC-ADD ON

## 2014-11-30 LAB — URINALYSIS, ROUTINE W REFLEX MICROSCOPIC
Bilirubin Urine: NEGATIVE
Glucose, UA: 100 mg/dL — AB
Hgb urine dipstick: NEGATIVE
Ketones, ur: NEGATIVE mg/dL
Leukocytes, UA: NEGATIVE
Nitrite: NEGATIVE
Protein, ur: NEGATIVE mg/dL
Specific Gravity, Urine: 1.019 (ref 1.005–1.030)
Urobilinogen, UA: 0.2 mg/dL (ref 0.0–1.0)
pH: 7.5 (ref 5.0–8.0)

## 2014-11-30 LAB — LIPASE, BLOOD: Lipase: 25 U/L (ref 11–59)

## 2014-11-30 MED ORDER — LIDOCAINE VISCOUS 2 % MT SOLN
15.0000 mL | Freq: Once | OROMUCOSAL | Status: AC
  Administered 2014-11-30: 15 mL via OROMUCOSAL
  Filled 2014-11-30: qty 15

## 2014-11-30 MED ORDER — KETOROLAC TROMETHAMINE 30 MG/ML IJ SOLN
30.0000 mg | Freq: Once | INTRAMUSCULAR | Status: AC
  Administered 2014-12-01: 30 mg via INTRAVENOUS
  Filled 2014-11-30: qty 1

## 2014-11-30 MED ORDER — SODIUM CHLORIDE 0.9 % IV BOLUS (SEPSIS): 1000.0000 mL | Freq: Once | INTRAVENOUS | Status: DC

## 2014-11-30 MED ORDER — SODIUM CHLORIDE 0.9 % IV BOLUS (SEPSIS)
1000.0000 mL | Freq: Once | INTRAVENOUS | Status: AC
  Administered 2014-11-30: 1000 mL via INTRAVENOUS

## 2014-11-30 NOTE — ED Notes (Signed)
The pt is c/o rt and lt ear pain since yesterday.  Today she has had rt flank pain.  lmop sept this year.  Hyst.    No n v or diarrhea

## 2014-11-30 NOTE — ED Notes (Signed)
Patient's MAIN CONCERN is throat and earn pain.

## 2014-11-30 NOTE — ED Provider Notes (Signed)
CSN: [id number]     Arrival date & time 11/30/14  1930 History   First MD Initiated Contact with Patient 11/30/14 2152     Chief Complaint  Patient presents with  . Flank Pain     (Consider location/radiation/quality/duration/timing/severity/associated sxs/prior Treatment) Patient is a 33 y.o. female presenting with flank pain and pharyngitis.  Flank Pain Associated symptoms include fatigue, a sore throat and swollen glands. Pertinent negatives include no abdominal pain, chest pain, congestion, coughing, fever, headaches, nausea, neck pain, numbness, rash, vertigo, vomiting or weakness.  Sore Throat This is a new problem. The current episode started in the past 7 days. The problem occurs constantly. The problem has been unchanged. Associated symptoms include fatigue, a sore throat and swollen glands. Pertinent negatives include no abdominal pain, chest pain, congestion, coughing, fever, headaches, nausea, neck pain, numbness, rash, vertigo, vomiting or weakness. The symptoms are aggravated by eating. The treatment provided no relief.    Past Medical History  Diagnosis Date  . Depression   . Seizures     non epileptic events per epilepsy monitoring unit  . Seizures     last seizure July 2015- does not convulse but has a sleep effect  . MVP (mitral valve prolapse)     as a child  . GERD (gastroesophageal reflux disease)     no meds  . Anemia   . Headache(784.0)     tension and with anxiety   Past Surgical History  Procedure Laterality Date  . Tubal ligation    . Cesarean section N/A   . Abdominal hysterectomy N/A 08/15/2014    Procedure: HYSTERECTOMY ABDOMINAL WITH CYSTOSCOPY;  Surgeon: Sanjuana Kava, MD;  Location: [facility];  Service: Gynecology;  Laterality: N/A;   No family history on file. History  Substance Use Topics  . Smoking status: Never Smoker   . Smokeless tobacco: Not on file  . Alcohol Use: No   OB History    No data available     Review of Systems   Constitutional: Positive for appetite change and fatigue. Negative for fever.  HENT: Positive for sore throat, trouble swallowing (pain) and voice change (hoarse voice). Negative for congestion and drooling.   Eyes: Negative for visual disturbance.  Respiratory: Negative for cough and shortness of breath.   Cardiovascular: Negative for chest pain.  Gastrointestinal: Negative for nausea, vomiting and abdominal pain.  Genitourinary: Positive for dysuria (pain in RLQ when she urinates). Negative for flank pain, vaginal bleeding, vaginal discharge and difficulty urinating.  Musculoskeletal: Negative for back pain and neck pain.  Skin: Negative for rash.  Neurological: Negative for vertigo, syncope, weakness, numbness and headaches.      Allergies  Sulfamethoxazole-trimethoprim and Penicillins  Home Medications   Prior to Admission medications   Medication Sig Start Date End Date Taking? Authorizing Provider  ARIPiprazole (ABILIFY) 5 MG tablet Take 10 mg by mouth daily after supper.    Yes Historical Provider, MD  DULoxetine (CYMBALTA) 30 MG capsule Take 90 mg by mouth daily after supper.    Yes Historical Provider, MD  valACYclovir (VALTREX) 1000 MG tablet Take 500 mg by mouth daily.   Yes Historical Provider, MD  docusate sodium 100 MG CAPS Take 100 mg by mouth daily as needed for mild constipation. Patient not taking: Reported on 11/30/2014 08/18/14   Sanjuana Kava, MD  HYDROcodone-acetaminophen (NORCO/VICODIN) 5-325 MG per tablet Take 1-2 tablets by mouth every 4 (four) hours as needed for moderate pain. Patient not taking: Reported on 11/30/2014 08/18/14  Sanjuana Kava, MD  HYDROmorphone (DILAUDID) 2 MG tablet Take 1-2 tablets (2-4 mg total) by mouth every 4 (four) hours as needed for severe pain (2mg  forpain scale 4-7, > 7 give 4 mg). Patient not taking: Reported on 11/30/2014 08/18/14   Sanjuana Kava, MD  ibuprofen (ADVIL,MOTRIN) 600 MG tablet Take 1 tablet (600 mg total) by mouth every 6  (six) hours as needed. Patient not taking: Reported on 11/30/2014 08/18/14   Sanjuana Kava, MD   BP 144/84 mmHg  Pulse 118  Temp(Src) 99.2 F (37.3 C) (Oral)  Resp 20  Ht 5\' 9"  (1.753 m)  Wt 230 lb (104.327 kg)  BMI 33.95 kg/m2  SpO2 98%  LMP 08/05/2014 Physical Exam  Constitutional: She is oriented to person, place, and time. She appears well-developed and well-nourished. No distress.  HENT:  Head: Normocephalic and atraumatic.  Right Ear: Tympanic membrane is not injected.  Left Ear: Tympanic membrane is not injected.  Eyes: Conjunctivae and EOM are normal.  Neck: Normal range of motion. No rigidity. Normal range of motion present.  Cardiovascular: Regular rhythm, normal heart sounds and intact distal pulses.  Tachycardia present.  Exam reveals no gallop and no friction rub.   No murmur heard. Pulmonary/Chest: Effort normal and breath sounds normal. No respiratory distress. She has no wheezes. She has no rales.  Abdominal: Soft. She exhibits no distension. There is no tenderness. There is no guarding.  Musculoskeletal: She exhibits no edema or tenderness.  Lymphadenopathy:    She has cervical adenopathy.  Neurological: She is alert and oriented to person, place, and time.  Skin: Skin is warm and dry. No rash noted. She is not diaphoretic. No erythema.  Nursing note and vitals reviewed.   ED Course  Procedures (including critical care time) Labs Review Labs Reviewed  URINALYSIS, ROUTINE W REFLEX MICROSCOPIC - Abnormal; Notable for the following:    APPearance TURBID (*)    Glucose, UA 100 (*)    All other components within normal limits  CBC WITH DIFFERENTIAL - Abnormal; Notable for the following:    WBC 12.7 (*)    Hemoglobin 11.9 (*)    MCH 24.8 (*)    Neutrophils Relative % 80 (*)    Neutro Abs 10.0 (*)    All other components within normal limits  COMPREHENSIVE METABOLIC PANEL - Abnormal; Notable for the following:    Sodium 132 (*)    Glucose, Bld 184 (*)     Albumin 3.4 (*)    Total Bilirubin 0.2 (*)    All other components within normal limits  URINE MICROSCOPIC-ADD ON - Abnormal; Notable for the following:    Squamous Epithelial / LPF MANY (*)    Bacteria, UA FEW (*)    All other components within normal limits  GC/CHLAMYDIA PROBE AMP  RAPID STREP SCREEN  LIPASE, BLOOD  RPR    Imaging Review No results found.   EKG Interpretation None      MDM   Final diagnoses:  None   33 year old female with a history of hysterectomy 3 months ago presents with concern of sore throat, ear fullness, pain in the right lower quadrant with urination and dysuria.  Patient without signs of peritonsillar abscess on exam, was suspicion for retropharyngeal or abscess or epiglottitis except exam. She has a normal voice, no drooling, no neck pain.  A rapid strep was sent which was positive. Bilateral tympanic membranes appear clear without signs of otitis media.  She has not been eating or drinking well  due to her sore throat and has mild hyponatremia and tachycardia likely secondary to dehydration. She is given 1 L of normal saline and had improvement in HR.  Given some continued tachycardia, will give a second bolus of NS.  Pt given toradol, decadron, and viscous lidocaine and clindamycin for symptom relief and strep throat.   Patient describes pain on the right side only with urination and dysuria.  Her urinalysis shows no signs of urinary tract infection. Given pain is only present at times of urination and abdominal exam is benign and low suspicion for appendicitis, tubo-ovarian abscess, ovarian torsion, or postop complication.  Denies vaginal discharge or bleeding and given pain only with urination do not feel pelvic exam is indicated.  Placed order for GC/Chl in urine given dysuria.  Patient to receive bolus of NS. Discussed discharge, reasons to return and need for follow up in detail. Will be given clindamycin rx for 1 week. Patient discharged in stable  condition with understanding of reasons to return.    Alvino Chapel, MD 12/01/14 [id number]  Ezequiel Essex, MD 12/01/14 [phone]

## 2014-11-30 NOTE — ED Notes (Signed)
Dr. Cathleen Fears at bedside.

## 2014-12-01 DIAGNOSIS — J020 Streptococcal pharyngitis: Secondary | ICD-10-CM | POA: Diagnosis not present

## 2014-12-01 LAB — RAPID STREP SCREEN (MED CTR MEBANE ONLY): Streptococcus, Group A Screen (Direct): POSITIVE — AB

## 2014-12-01 LAB — RPR

## 2014-12-01 MED ORDER — SODIUM CHLORIDE 0.9 % IV BOLUS (SEPSIS)
1000.0000 mL | Freq: Once | INTRAVENOUS | Status: AC
  Administered 2014-12-01: 1000 mL via INTRAVENOUS

## 2014-12-01 MED ORDER — CLINDAMYCIN HCL 300 MG PO CAPS
300.0000 mg | ORAL_CAPSULE | Freq: Once | ORAL | Status: AC
  Administered 2014-12-01: 300 mg via ORAL
  Filled 2014-12-01: qty 1

## 2014-12-01 MED ORDER — DEXAMETHASONE SODIUM PHOSPHATE 10 MG/ML IJ SOLN
10.0000 mg | Freq: Once | INTRAMUSCULAR | Status: AC
  Administered 2014-12-01: 10 mg via INTRAVENOUS

## 2014-12-01 MED ORDER — DEXAMETHASONE SODIUM PHOSPHATE 10 MG/ML IJ SOLN
INTRAMUSCULAR | Status: AC
  Administered 2014-12-01: 10 mg via INTRAVENOUS
  Filled 2014-12-01: qty 1

## 2014-12-01 MED ORDER — CLINDAMYCIN HCL 150 MG PO CAPS: 300.0000 mg | ORAL_CAPSULE | Freq: Three times a day (TID) | ORAL | Status: AC

## 2014-12-02 LAB — GC/CHLAMYDIA PROBE AMP
CT Probe RNA: NEGATIVE
GC Probe RNA: NEGATIVE

## 2014-12-08 DIAGNOSIS — L03311 Cellulitis of abdominal wall: Secondary | ICD-10-CM | POA: Diagnosis not present

## 2014-12-08 DIAGNOSIS — Z9989 Dependence on other enabling machines and devices: Secondary | ICD-10-CM

## 2014-12-08 DIAGNOSIS — G4733 Obstructive sleep apnea (adult) (pediatric): Secondary | ICD-10-CM | POA: Diagnosis not present

## 2014-12-29 DIAGNOSIS — S56911A Strain of unspecified muscles, fascia and tendons at forearm level, right arm, initial encounter: Secondary | ICD-10-CM | POA: Diagnosis not present

## 2015-01-12 DIAGNOSIS — S56911D Strain of unspecified muscles, fascia and tendons at forearm level, right arm, subsequent encounter: Secondary | ICD-10-CM | POA: Diagnosis not present

## 2015-01-14 DIAGNOSIS — R197 Diarrhea, unspecified: Secondary | ICD-10-CM | POA: Diagnosis not present

## 2015-01-14 DIAGNOSIS — R1030 Lower abdominal pain, unspecified: Secondary | ICD-10-CM | POA: Diagnosis not present

## 2015-01-15 DIAGNOSIS — R197 Diarrhea, unspecified: Secondary | ICD-10-CM | POA: Diagnosis not present

## 2015-01-15 DIAGNOSIS — S56911D Strain of unspecified muscles, fascia and tendons at forearm level, right arm, subsequent encounter: Secondary | ICD-10-CM | POA: Diagnosis not present

## 2015-01-15 DIAGNOSIS — K529 Noninfective gastroenteritis and colitis, unspecified: Secondary | ICD-10-CM | POA: Diagnosis not present

## 2015-01-19 DIAGNOSIS — G4733 Obstructive sleep apnea (adult) (pediatric): Secondary | ICD-10-CM | POA: Diagnosis not present

## 2015-01-20 DIAGNOSIS — R5383 Other fatigue: Secondary | ICD-10-CM | POA: Diagnosis not present

## 2015-01-20 DIAGNOSIS — G4710 Hypersomnia, unspecified: Secondary | ICD-10-CM | POA: Diagnosis not present

## 2015-01-20 DIAGNOSIS — E669 Obesity, unspecified: Secondary | ICD-10-CM | POA: Diagnosis not present

## 2015-01-20 DIAGNOSIS — G4733 Obstructive sleep apnea (adult) (pediatric): Secondary | ICD-10-CM | POA: Diagnosis not present

## 2015-01-21 DIAGNOSIS — K529 Noninfective gastroenteritis and colitis, unspecified: Secondary | ICD-10-CM | POA: Diagnosis not present

## 2015-01-22 DIAGNOSIS — K529 Noninfective gastroenteritis and colitis, unspecified: Secondary | ICD-10-CM | POA: Diagnosis not present

## 2015-02-02 DIAGNOSIS — S56911D Strain of unspecified muscles, fascia and tendons at forearm level, right arm, subsequent encounter: Secondary | ICD-10-CM | POA: Diagnosis not present

## 2015-02-11 DIAGNOSIS — S56911D Strain of unspecified muscles, fascia and tendons at forearm level, right arm, subsequent encounter: Secondary | ICD-10-CM | POA: Diagnosis not present

## 2015-02-13 ENCOUNTER — Emergency Department (HOSPITAL_COMMUNITY)
Admission: EM | Admit: 2015-02-13 | Discharge: 2015-02-13 | Disposition: A | Discharge status: Home / Self Care | Payer: Medicare Other | Attending: Emergency Medicine | Admitting: Emergency Medicine

## 2015-02-13 ENCOUNTER — Encounter (HOSPITAL_COMMUNITY): Payer: Self-pay | Admitting: *Deleted

## 2015-02-13 DIAGNOSIS — Z880 Allergy status to penicillin: Secondary | ICD-10-CM | POA: Insufficient documentation

## 2015-02-13 DIAGNOSIS — F329 Major depressive disorder, single episode, unspecified: Secondary | ICD-10-CM | POA: Diagnosis not present

## 2015-02-13 DIAGNOSIS — S0990XA Unspecified injury of head, initial encounter: Secondary | ICD-10-CM | POA: Diagnosis not present

## 2015-02-13 DIAGNOSIS — S9031XA Contusion of right foot, initial encounter: Secondary | ICD-10-CM | POA: Insufficient documentation

## 2015-02-13 DIAGNOSIS — W208XXA Other cause of strike by thrown, projected or falling object, initial encounter: Secondary | ICD-10-CM | POA: Diagnosis not present

## 2015-02-13 DIAGNOSIS — F419 Anxiety disorder, unspecified: Secondary | ICD-10-CM | POA: Insufficient documentation

## 2015-02-13 DIAGNOSIS — Y9289 Other specified places as the place of occurrence of the external cause: Secondary | ICD-10-CM | POA: Insufficient documentation

## 2015-02-13 DIAGNOSIS — Z79899 Other long term (current) drug therapy: Secondary | ICD-10-CM | POA: Diagnosis not present

## 2015-02-13 DIAGNOSIS — Y9389 Activity, other specified: Secondary | ICD-10-CM | POA: Insufficient documentation

## 2015-02-13 DIAGNOSIS — Z862 Personal history of diseases of the blood and blood-forming organs and certain disorders involving the immune mechanism: Secondary | ICD-10-CM | POA: Insufficient documentation

## 2015-02-13 DIAGNOSIS — Z8679 Personal history of other diseases of the circulatory system: Secondary | ICD-10-CM | POA: Insufficient documentation

## 2015-02-13 DIAGNOSIS — Y998 Other external cause status: Secondary | ICD-10-CM | POA: Insufficient documentation

## 2015-02-13 DIAGNOSIS — Z8719 Personal history of other diseases of the digestive system: Secondary | ICD-10-CM | POA: Insufficient documentation

## 2015-02-13 MED ORDER — IBUPROFEN 800 MG PO TABS: 800.0000 mg | ORAL_TABLET | Freq: Three times a day (TID) | ORAL | Status: DC | PRN

## 2015-02-13 MED ORDER — HYDROCODONE-ACETAMINOPHEN 5-325 MG PO TABS: 1.0000 | ORAL_TABLET | ORAL | Status: DC | PRN

## 2015-02-13 NOTE — Discharge Instructions (Signed)
Read the information below.  Use the prescribed medication as directed.  Please discuss all new medications with your pharmacist.  Do not take additional tylenol while taking the prescribed pain medication to avoid overdose.  You may return to the Emergency Department at any time for worsening condition or any new symptoms that concern you.    If there is any possibility that you might be pregnant, please let your health care provider know and discuss this with the pharmacist to ensure medication safety.    You have had a head injury which does not appear to require admission at this time. A concussion is a state of changed mental ability from trauma. SEEK IMMEDIATE MEDICAL ATTENTION IF: There is confusion or drowsiness (although children frequently become drowsy after injury).  You cannot awaken the injured person.  There is nausea (feeling sick to your stomach) or continued, forceful vomiting.  You notice dizziness or unsteadiness which is getting worse, or inability to walk.  You have convulsions or unconsciousness.  You experience severe, persistent headaches not relieved by Tylenol?. (Do not take aspirin as this impairs clotting abilities). Take other pain medications only as directed.  You cannot use arms or legs normally.  There are changes in pupil sizes. (This is the black center in the colored part of the eye)  There is clear or bloody discharge from the nose or ears.  Change in speech, vision, swallowing, or understanding.  Localized weakness, numbness, tingling, or change in bowel or bladder control.   Head Injury The head may be injured, even if there are no obvious signs of injury. Obvious signs of injury include, loss of consciousness (being "knocked out"), or physical signs, such as an open wound (the skin is broken) or bruising (ecchymosis). SYMPTOMS   Symptoms depend on the extent of injury.  The seriousness of a head injury is not related to the presence of physical signs,  such as swelling.  Headache.  Nausea and vomiting.  Drowsiness.  Memory loss (amnesia).  Vision problems.  Confusion or irritability.  Pupils of different size.  Pupils that do not react to light.  Loss of consciousness, either temporary or for long periods.  Bleeding of the scalp, if the skin is broken. CAUSES  The most common cause of head injury is direct hit (trauma) to the head. Common causes of this injury include: motor vehicle crashes, falls, or tackling with the head (football). RISK INCREASES WITH:   Contact sports (i.e. football, boxing), riding bicycles, motorcycles, or horses without a helmet.  Seizure disorders.  Drinking alcohol.  Use of mind-altering drugs. PREVENTION   Wear properly fitted and padded protective headgear.  Do not drink alcohol or use mind-altering drugs and drive. PROGNOSIS  If treated early, most head injuries can be cured. However, certain problems involved with head injuries can be life threatening. RELATED COMPLICATIONS   Subdural hemorrhage or epidural hematoma (bleeding under the skull).  Concussion (injury to the brain).  Bleeding into the brain. TREATMENT  Any injury to the head should be evaluated by a medical professional, especially if the injury involves loss of consciousness or other symptoms noted above. Severe head injuries may require hospitalization and possible surgery, to relieve pressure on the brain. After evaluation, if you are sent to be watched at home, it is important to have someone else wake you up every 2 hours, for at least 24 hours following the injury. If the person is not able to wake you, or if there appears to  be a change in your responsiveness, he or she must contact your caregiver immediately. This may be a sign of a more serious injury. Also, report any of the following symptoms: nausea, vomiting, inability to move arms and legs equally well on both sides, fever (above 100 F or 37.8 C), neck  stiffness, pupils of unequal size or shape or reaction to light, convulsions, noticeable restlessness, severe headache that persists for longer than 4 hours after injury, confusion, disorientation, or mental status changes.  MEDICATION  Do not take any medicines, unless advised by your caregiver. This includes over-the-counter pain medicines (i.e. ibuprofen, acetaminophen, aspirin). SEEK MEDICAL CARE IF:   Symptoms get worse or do not improve in 24 hours.  Any of the following symptoms occur:  Vomiting.  Inability to move arms and legs equally well on both sides.  Fever above 100 F (37.8 C).  Stiff neck.  Pupils of unequal size, shape, or reaction to light.  Convulsions (violent shaking).  Noticeable restlessness.  Severe headache that persists for longer than 4 hours after injury.  Confusion, disorientation or mental status changes. Document Released: 11/21/2005 Document Revised: 02/13/2012 Document Reviewed: 03/05/2009 [facility] Patient Information 2015 [place], [place]. This information is not intended to replace advice given to you by your health care provider. Make sure you discuss any questions you have with your health care provider.  Foot Contusion A foot contusion is a deep bruise to the foot. Contusions are the result of an injury that caused bleeding under the skin. The contusion may turn blue, purple, or yellow. Minor injuries will give you a painless contusion, but more severe contusions may stay painful and swollen for a few weeks. CAUSES  A foot contusion comes from a direct blow to that area, such as a heavy object falling on the foot. SYMPTOMS   Swelling of the foot.  Discoloration of the foot.  Tenderness or soreness of the foot. DIAGNOSIS  You will have a physical exam and will be asked about your history. You may need an X-ray of your foot to look for a broken bone (fracture).  TREATMENT  An elastic wrap may be recommended to support your foot. Resting,  elevating, and applying cold compresses to your foot are often the best treatments for a foot contusion. Over-the-counter medicines may also be recommended for pain control. HOME CARE INSTRUCTIONS   Put ice on the injured area.  Put ice in a plastic bag.  Place a towel between your skin and the bag.  Leave the ice on for 15-20 minutes, 03-04 times a day.  Only take over-the-counter or prescription medicines for pain, discomfort, or fever as directed by your caregiver.  If told, use an elastic wrap as directed. This can help reduce swelling. You may remove the wrap for sleeping, showering, and bathing. If your toes become numb, cold, or blue, take the wrap off and reapply it more loosely.  Elevate your foot with pillows to reduce swelling.  Try to avoid standing or walking while the foot is painful. Do not resume use until instructed by your caregiver. Then, begin use gradually. If pain develops, decrease use. Gradually increase activities that do not cause discomfort until you have normal use of your foot.  See your caregiver as directed. It is very important to keep all follow-up appointments in order to avoid any lasting problems with your foot, including long-term (chronic) pain. SEEK IMMEDIATE MEDICAL CARE IF:   You have increased redness, swelling, or pain in your foot.  Your swelling or pain is not relieved with medicines.  You have loss of feeling in your foot or are unable to move your toes.  Your foot turns cold or blue.  You have pain when you move your toes.  Your foot becomes warm to the touch.  Your contusion does not improve in 2 days. MAKE SURE YOU:   Understand these instructions.  Will watch your condition.  Will get help right away if you are not doing well or get worse. Document Released: 09/12/2006 Document Revised: 05/22/2012 Document Reviewed: 10/25/2011 [facility] Patient Information 2015 [place], [place]. This information is not intended to replace  advice given to you by your health care provider. Make sure you discuss any questions you have with your health care provider.  Concussion A concussion is a brain injury. It is caused by:  A hit to the head.  A quick and sudden movement (jolt) of the head or neck. A concussion is usually not life threatening. Even so, it can cause serious problems. If you had a concussion before, you may have concussion-like problems after a hit to your head. HOME CARE General Instructions  Follow your doctor's directions carefully.  Take medicines only as told by your doctor.  Only take medicines your doctor says are safe.  Do not drink alcohol until your doctor says it is okay. Alcohol and some drugs can slow down healing. They can also put you at risk for further injury.  If you are having trouble remembering things, write them down.  Try to do one thing at a time if you get distracted easily. For example, do not watch TV while making dinner.  Talk to your family members or close friends when making important decisions.  Follow up with your doctor as told.  Watch your symptoms. Tell others to do the same. Serious problems can sometimes happen after a concussion. Older adults are more likely to have these problems.  Tell your teachers, school nurse, school counselor, coach, Product/process development scientist, or work Freight forwarder about your concussion. Tell them about what you can or cannot do. They should watch to see if:  It gets even harder for you to pay attention or concentrate.  It gets even harder for you to remember things or learn new things.  You need more time than normal to finish things.  You become annoyed (irritable) more than before.  You are not able to deal with stress as well.  You have more problems than before.  Rest. Make sure you:  Get plenty of sleep at night.  Go to sleep early.  Go to bed at the same time every day. Try to wake up at the same time.  Rest during the  day.  Take naps when you feel tired.  Limit activities where you have to think a lot or concentrate. These include:  Doing homework.  Doing work related to a job.  Watching TV.  Using the computer. Returning To Your Regular Activities Return to your normal activities slowly, not all at once. You must give your body and brain enough time to heal.   Do not play sports or do other athletic activities until your doctor says it is okay.  Ask your doctor when you can drive, ride a bicycle, or work other vehicles or machines. Never do these things if you feel dizzy.  Ask your doctor about when you can return to work or school. Preventing Another Concussion It is very important to avoid another brain injury, especially  before you have healed. In rare cases, another injury can lead to permanent brain damage, brain swelling, or death. The risk of this is greatest during the first 7-10 days after your injury. Avoid injuries by:   Wearing a seat belt when riding in a car.  Not drinking too much alcohol.  Avoiding activities that could lead to a second concussion (such as contact sports).  Wearing a helmet when doing activities like:  Biking.  Skiing.  Skateboarding.  Skating.  Making your home safer by:  Removing things from the floor or stairways that could make you trip.  Using grab bars in bathrooms and handrails by stairs.  Placing non-slip mats on floors and in bathtubs.  Improve lighting in dark areas. GET HELP IF:  It gets even harder for you to pay attention or concentrate.  It gets even harder for you to remember things or learn new things.  You need more time than normal to finish things.  You become annoyed (irritable) more than before.  You are not able to deal with stress as well.  You have more problems than before.  You have problems keeping your balance.  You are not able to react quickly when you should. Get help if you have any of these problems  for more than 2 weeks:   Lasting (chronic) headaches.  Dizziness or trouble balancing.  Feeling sick to your stomach (nausea).  Seeing (vision) problems.  Being affected by noises or light more than normal.  Feeling sad, low, down in the dumps, blue, gloomy, or empty (depressed).  Mood changes (mood swings).  Feeling of fear or nervousness about what may happen (anxiety).  Feeling annoyed.  Memory problems.  Problems concentrating or paying attention.  Sleep problems.  Feeling tired all the time. GET HELP RIGHT AWAY IF:   You have bad headaches or your headaches get worse.  You have weakness (even if it is in one hand, leg, or part of the face).  You have loss of feeling (numbness).  You feel off balance.  You keep throwing up (vomiting).  You feel tired.  One black center of your eye (pupil) is larger than the other.  You twitch or shake violently (convulse).  Your speech is not clear (slurred).  You are more confused, easily angered (agitated), or annoyed than before.  You have more trouble resting than before.  You are unable to recognize people or places.  You have neck pain.  It is difficult to wake you up.  You have unusual behavior changes.  You pass out (lose consciousness). MAKE SURE YOU:   Understand these instructions.  Will watch your condition.  Will get help right away if you are not doing well or get worse. Document Released: 11/09/2009 Document Revised: 04/07/2014 Document Reviewed: 06/13/2013 [facility] Patient Information 2015 [place], [place]. This information is not intended to replace advice given to you by your health care provider. Make sure you discuss any questions you have with your health care provider.

## 2015-02-13 NOTE — ED Provider Notes (Signed)
CSN: [id number]     Arrival date & time 02/13/15  2694 History   First MD Initiated Contact with Patient 02/13/15 [phone]     Chief Complaint  Patient presents with  . Head Injury     (Consider location/radiation/quality/duration/timing/severity/associated sxs/prior Treatment) The history is provided by the patient.     Patient presents following head injury that occurred yesterday.  States she was concerned because she thought there was a dent in her forehead following the event.  States around 1pm yesterday she was reaching over her head to push a box of canisters to the side to get to the ones she wanted.  This box of 4 ceramic canisters fell and hit her forehead and also her right foot.  Initially she states she "literally saw birds flying around my head" and felt dizzy She has had mild headache since then.  Denies severe headache, denies neck or back pain, weakness or numbness of the extremities, vomiting, other focal neurologic deficits.   Past Medical History  Diagnosis Date  . Depression   . Seizures     non epileptic events per epilepsy monitoring unit  . Seizures     last seizure July 2015- does not convulse but has a sleep effect  . MVP (mitral valve prolapse)     as a child  . GERD (gastroesophageal reflux disease)     no meds  . Anemia   . Headache(784.0)     tension and with anxiety   Past Surgical History  Procedure Laterality Date  . Tubal ligation    . Cesarean section N/A   . Abdominal hysterectomy N/A 08/15/2014    Procedure: HYSTERECTOMY ABDOMINAL WITH CYSTOSCOPY;  Surgeon: Sanjuana Kava, MD;  Location: [facility];  Service: Gynecology;  Laterality: N/A;   No family history on file. History  Substance Use Topics  . Smoking status: Never Smoker   . Smokeless tobacco: Not on file  . Alcohol Use: No   OB History    No data available     Review of Systems  All other systems reviewed and are negative.     Allergies  Sulfamethoxazole-trimethoprim and  Penicillins  Home Medications   Prior to Admission medications   Medication Sig Start Date End Date Taking? Authorizing Provider  ARIPiprazole (ABILIFY) 5 MG tablet Take 10 mg by mouth daily after supper.     Historical Provider, MD  docusate sodium 100 MG CAPS Take 100 mg by mouth daily as needed for mild constipation. Patient not taking: Reported on 11/30/2014 08/18/14   Sanjuana Kava, MD  DULoxetine (CYMBALTA) 30 MG capsule Take 90 mg by mouth daily after supper.     Historical Provider, MD  HYDROcodone-acetaminophen (NORCO/VICODIN) 5-325 MG per tablet Take 1-2 tablets by mouth every 4 (four) hours as needed for moderate pain. Patient not taking: Reported on 11/30/2014 08/18/14   Sanjuana Kava, MD  HYDROmorphone (DILAUDID) 2 MG tablet Take 1-2 tablets (2-4 mg total) by mouth every 4 (four) hours as needed for severe pain (2mg  forpain scale 4-7, > 7 give 4 mg). Patient not taking: Reported on 11/30/2014 08/18/14   Sanjuana Kava, MD  ibuprofen (ADVIL,MOTRIN) 600 MG tablet Take 1 tablet (600 mg total) by mouth every 6 (six) hours as needed. Patient not taking: Reported on 11/30/2014 08/18/14   Sanjuana Kava, MD  valACYclovir (VALTREX) 1000 MG tablet Take 500 mg by mouth daily.    Historical Provider, MD   BP 129/72 mmHg  Pulse 85  Temp(Src) 98.2 F (  36.8 C) (Oral)  Resp 20  SpO2 100%  LMP 08/05/2014 Physical Exam  Constitutional: She appears well-developed and well-nourished. No distress.  HENT:  Head: Normocephalic and atraumatic.  Neck: Neck supple.  Pulmonary/Chest: Effort normal.  Musculoskeletal:       Feet:  Neurological: She is alert.  CN II-XII intact, EOMs intact, no pronator drift, grip strengths equal bilaterally; strength 5/5 in all extremities, sensation intact in all extremities; finger to nose, heel to shin, rapid alternating movements normal.    Gait is normal.  Skin: She is not diaphoretic.  Nursing note and vitals reviewed.   ED Course  Procedures (including critical care  time) Labs Review Labs Reviewed - No data to display  Imaging Review No results found.   EKG Interpretation None      MDM   Final diagnoses:  Head injury, initial encounter  Foot contusion, right, initial encounter    Afebrile, nontoxic patient with head and foot injury yesterday with ceramic canisters falling on head.  Per patient's history, she may have suffered a very mild concussion.  Neurologically intact.  There is no discoloration or e/o injury to the forehead.  Foot without crepitus or significant tenderness.  Offered pt xray and she declined.  D/C home with norco, ibuprofen, PCP follow up.  Discussed result, findings, treatment, and follow up  with patient.  Pt given return precautions.  Pt verbalizes understanding and agrees with plan.         Clayton Bibles, PA-C 02/13/15 [place], MD 02/14/15 1032

## 2015-02-13 NOTE — ED Notes (Signed)
Pt states that he was reaching for a box of ceramic canisters when the box opened and the canisters fell on her forehead and rt foot. Denies LOC. Pt reports headache.

## 2015-02-18 DIAGNOSIS — S56911D Strain of unspecified muscles, fascia and tendons at forearm level, right arm, subsequent encounter: Secondary | ICD-10-CM | POA: Diagnosis not present

## 2015-02-19 DIAGNOSIS — G44309 Post-traumatic headache, unspecified, not intractable: Secondary | ICD-10-CM | POA: Diagnosis not present

## 2015-02-19 DIAGNOSIS — E669 Obesity, unspecified: Secondary | ICD-10-CM | POA: Diagnosis not present

## 2015-02-19 DIAGNOSIS — F319 Bipolar disorder, unspecified: Secondary | ICD-10-CM | POA: Diagnosis not present

## 2015-02-19 DIAGNOSIS — Z79899 Other long term (current) drug therapy: Secondary | ICD-10-CM | POA: Diagnosis not present

## 2015-02-19 DIAGNOSIS — K760 Fatty (change of) liver, not elsewhere classified: Secondary | ICD-10-CM | POA: Diagnosis not present

## 2015-02-19 DIAGNOSIS — G4710 Hypersomnia, unspecified: Secondary | ICD-10-CM | POA: Diagnosis not present

## 2015-02-19 DIAGNOSIS — G4733 Obstructive sleep apnea (adult) (pediatric): Secondary | ICD-10-CM | POA: Diagnosis not present

## 2015-03-30 DIAGNOSIS — G4733 Obstructive sleep apnea (adult) (pediatric): Secondary | ICD-10-CM | POA: Diagnosis not present

## 2015-03-31 DIAGNOSIS — G4733 Obstructive sleep apnea (adult) (pediatric): Secondary | ICD-10-CM | POA: Diagnosis not present

## 2015-03-31 DIAGNOSIS — G4710 Hypersomnia, unspecified: Secondary | ICD-10-CM | POA: Diagnosis not present

## 2015-04-22 DIAGNOSIS — F3132 Bipolar disorder, current episode depressed, moderate: Secondary | ICD-10-CM | POA: Diagnosis not present

## 2015-04-24 DIAGNOSIS — J029 Acute pharyngitis, unspecified: Secondary | ICD-10-CM | POA: Diagnosis not present

## 2015-04-24 DIAGNOSIS — J020 Streptococcal pharyngitis: Secondary | ICD-10-CM | POA: Diagnosis not present

## 2015-05-28 DIAGNOSIS — N63 Unspecified lump in breast: Secondary | ICD-10-CM | POA: Diagnosis not present

## 2015-06-01 DIAGNOSIS — N63 Unspecified lump in breast: Secondary | ICD-10-CM | POA: Diagnosis not present

## 2015-06-01 DIAGNOSIS — Z803 Family history of malignant neoplasm of breast: Secondary | ICD-10-CM | POA: Diagnosis not present

## 2015-06-30 DIAGNOSIS — R5383 Other fatigue: Secondary | ICD-10-CM | POA: Diagnosis not present

## 2015-06-30 DIAGNOSIS — G4733 Obstructive sleep apnea (adult) (pediatric): Secondary | ICD-10-CM | POA: Diagnosis not present

## 2015-06-30 DIAGNOSIS — G4710 Hypersomnia, unspecified: Secondary | ICD-10-CM | POA: Diagnosis not present

## 2015-06-30 DIAGNOSIS — E669 Obesity, unspecified: Secondary | ICD-10-CM | POA: Diagnosis not present

## 2015-08-03 DIAGNOSIS — F3132 Bipolar disorder, current episode depressed, moderate: Secondary | ICD-10-CM | POA: Diagnosis not present

## 2015-08-21 DIAGNOSIS — M7502 Adhesive capsulitis of left shoulder: Secondary | ICD-10-CM | POA: Diagnosis not present

## 2015-08-21 DIAGNOSIS — M25512 Pain in left shoulder: Secondary | ICD-10-CM | POA: Diagnosis not present

## 2015-09-01 DIAGNOSIS — M25512 Pain in left shoulder: Secondary | ICD-10-CM | POA: Diagnosis not present

## 2015-09-03 DIAGNOSIS — M25512 Pain in left shoulder: Secondary | ICD-10-CM | POA: Diagnosis not present

## 2015-09-08 DIAGNOSIS — M25512 Pain in left shoulder: Secondary | ICD-10-CM | POA: Diagnosis not present

## 2015-09-10 DIAGNOSIS — M25512 Pain in left shoulder: Secondary | ICD-10-CM | POA: Diagnosis not present

## 2015-09-14 DIAGNOSIS — E1165 Type 2 diabetes mellitus with hyperglycemia: Secondary | ICD-10-CM | POA: Diagnosis not present

## 2015-09-14 DIAGNOSIS — Z23 Encounter for immunization: Secondary | ICD-10-CM | POA: Diagnosis not present

## 2015-09-14 DIAGNOSIS — R197 Diarrhea, unspecified: Secondary | ICD-10-CM | POA: Diagnosis not present

## 2015-09-15 DIAGNOSIS — M25512 Pain in left shoulder: Secondary | ICD-10-CM | POA: Diagnosis not present

## 2015-09-17 DIAGNOSIS — M25512 Pain in left shoulder: Secondary | ICD-10-CM | POA: Diagnosis not present

## 2015-09-22 DIAGNOSIS — M25512 Pain in left shoulder: Secondary | ICD-10-CM | POA: Diagnosis not present

## 2015-09-23 DIAGNOSIS — F329 Major depressive disorder, single episode, unspecified: Secondary | ICD-10-CM | POA: Diagnosis not present

## 2015-09-23 DIAGNOSIS — F419 Anxiety disorder, unspecified: Secondary | ICD-10-CM | POA: Diagnosis not present

## 2015-09-23 DIAGNOSIS — R197 Diarrhea, unspecified: Secondary | ICD-10-CM | POA: Diagnosis not present

## 2015-09-24 DIAGNOSIS — M25512 Pain in left shoulder: Secondary | ICD-10-CM | POA: Diagnosis not present

## 2015-09-25 DIAGNOSIS — M25512 Pain in left shoulder: Secondary | ICD-10-CM | POA: Diagnosis not present

## 2015-09-25 DIAGNOSIS — M7502 Adhesive capsulitis of left shoulder: Secondary | ICD-10-CM | POA: Diagnosis not present

## 2015-10-07 DIAGNOSIS — N760 Acute vaginitis: Secondary | ICD-10-CM | POA: Diagnosis not present

## 2015-10-08 DIAGNOSIS — Q040 Congenital malformations of corpus callosum: Secondary | ICD-10-CM | POA: Diagnosis not present

## 2015-10-08 DIAGNOSIS — J209 Acute bronchitis, unspecified: Secondary | ICD-10-CM | POA: Diagnosis not present

## 2015-10-12 DIAGNOSIS — R197 Diarrhea, unspecified: Secondary | ICD-10-CM | POA: Diagnosis not present

## 2015-10-12 DIAGNOSIS — K529 Noninfective gastroenteritis and colitis, unspecified: Secondary | ICD-10-CM | POA: Diagnosis not present

## 2015-10-23 DIAGNOSIS — M7502 Adhesive capsulitis of left shoulder: Secondary | ICD-10-CM | POA: Diagnosis not present

## 2015-10-23 DIAGNOSIS — M25512 Pain in left shoulder: Secondary | ICD-10-CM | POA: Diagnosis not present

## 2015-10-27 DIAGNOSIS — M7502 Adhesive capsulitis of left shoulder: Secondary | ICD-10-CM | POA: Diagnosis not present

## 2015-11-03 DIAGNOSIS — F3132 Bipolar disorder, current episode depressed, moderate: Secondary | ICD-10-CM | POA: Diagnosis not present

## 2015-11-04 DIAGNOSIS — M7502 Adhesive capsulitis of left shoulder: Secondary | ICD-10-CM | POA: Diagnosis not present

## 2015-11-04 DIAGNOSIS — M25512 Pain in left shoulder: Secondary | ICD-10-CM | POA: Diagnosis not present

## 2015-11-10 DIAGNOSIS — M75112 Incomplete rotator cuff tear or rupture of left shoulder, not specified as traumatic: Secondary | ICD-10-CM | POA: Diagnosis not present

## 2015-11-10 DIAGNOSIS — M7502 Adhesive capsulitis of left shoulder: Secondary | ICD-10-CM | POA: Diagnosis not present

## 2015-11-16 DIAGNOSIS — R197 Diarrhea, unspecified: Secondary | ICD-10-CM | POA: Diagnosis not present

## 2015-11-17 DIAGNOSIS — H00025 Hordeolum internum left lower eyelid: Secondary | ICD-10-CM | POA: Diagnosis not present

## 2015-11-17 DIAGNOSIS — J0100 Acute maxillary sinusitis, unspecified: Secondary | ICD-10-CM | POA: Diagnosis not present

## 2015-11-23 NOTE — H&P (Signed)
  Samantha [name] is an 34 y.o. female.    Chief Complaint: left shoulder pain and weakness  HPI: Pt is a 34 y.o. female complaining of left shoulder pain for multiple months. Pain had continually increased since the beginning. X-rays in the clinic show rotator cuff tear left shoulder. Pt has tried various conservative treatments which have failed to alleviate their symptoms, including injections and therapy. Various options are discussed with the patient. Risks, benefits and expectations were discussed with the patient. Patient understand the risks, benefits and expectations and wishes to proceed with surgery.   PCP:  Leamon Arnt, MD  D/C Plans: Home  PMH: Past Medical History  Diagnosis Date  . Depression   . Seizures     non epileptic events per epilepsy monitoring unit  . Seizures     last seizure July 2015- does not convulse but has a sleep effect  . MVP (mitral valve prolapse)     as a child  . GERD (gastroesophageal reflux disease)     no meds  . Anemia   . Headache(784.0)     tension and with anxiety    PSH: Past Surgical History  Procedure Laterality Date  . Tubal ligation    . Cesarean section N/A   . Abdominal hysterectomy N/A 08/15/2014    Procedure: HYSTERECTOMY ABDOMINAL WITH CYSTOSCOPY;  Surgeon: Sanjuana Kava, MD;  Location: [facility];  Service: Gynecology;  Laterality: N/A;    Social History:  reports that she has never smoked. She does not have any smokeless tobacco history on file. She reports that she does not drink alcohol or use illicit drugs.  Allergies:  Allergies  Allergen Reactions  . Sulfamethoxazole-Trimethoprim Other (See Comments)    Tongue swelling  . Penicillins Rash    Medications: No current facility-administered medications for this encounter.   Current Outpatient Prescriptions  Medication Sig Dispense Refill  . doxycycline (VIBRA-TABS) 100 MG tablet Take 1 tablet by mouth 2 (two) times daily.  0  . ibuprofen (ADVIL,MOTRIN) 800 MG  tablet Take 1 tablet (800 mg total) by mouth every 8 (eight) hours as needed for mild pain or moderate pain. 15 tablet 0  . valACYclovir (VALTREX) 500 MG tablet Take 500 mg by mouth daily.      No results found for this or any previous visit (from the past 48 hour(s)). No results found.  ROS: Pain with rom of the left upper extremity  Physical Exam:  Alert and oriented 34 y.o. female in no acute distress Cranial nerves 2-12 intact Cervical spine: full rom with no tenderness, nv intact distally Chest: active breath sounds bilaterally, no wheeze rhonchi or rales Heart: regular rate and rhythm, no murmur Abd: non tender non distended with active bowel sounds Hip is stable with rom  Left shoulder with good rom Moderate weakness with ER strength as compared to right nv intact distally No rashes or edema  Assessment/Plan Assessment: left rotator cuff tear  Plan: Patient will undergo a left shoulder rotator cuff repair by Dr. Veverly Fells at [facility]. Risks benefits and expectations were discussed with the patient. Patient understand risks, benefits and expectations and wishes to proceed.

## 2015-11-24 ENCOUNTER — Encounter (HOSPITAL_COMMUNITY)
Admission: RE | Admit: 2015-11-24 | Discharge: 2015-11-24 | Disposition: A | Discharge status: Home / Self Care | Payer: Medicare Other | Source: Ambulatory Visit | Attending: Orthopedic Surgery | Admitting: Orthopedic Surgery

## 2015-11-24 ENCOUNTER — Encounter (HOSPITAL_COMMUNITY): Payer: Self-pay

## 2015-11-24 DIAGNOSIS — G4730 Sleep apnea, unspecified: Secondary | ICD-10-CM | POA: Diagnosis not present

## 2015-11-24 DIAGNOSIS — E669 Obesity, unspecified: Secondary | ICD-10-CM | POA: Diagnosis not present

## 2015-11-24 DIAGNOSIS — X58XXXA Exposure to other specified factors, initial encounter: Secondary | ICD-10-CM | POA: Diagnosis not present

## 2015-11-24 DIAGNOSIS — Z6832 Body mass index (BMI) 32.0-32.9, adult: Secondary | ICD-10-CM | POA: Diagnosis not present

## 2015-11-24 DIAGNOSIS — M7542 Impingement syndrome of left shoulder: Secondary | ICD-10-CM | POA: Diagnosis not present

## 2015-11-24 DIAGNOSIS — S43492A Other sprain of left shoulder joint, initial encounter: Secondary | ICD-10-CM | POA: Diagnosis not present

## 2015-11-24 HISTORY — DX: Pneumonia, unspecified organism: J18.9

## 2015-11-24 HISTORY — DX: Anxiety disorder, unspecified: F41.9

## 2015-11-24 HISTORY — DX: Sleep apnea, unspecified: G47.30

## 2015-11-24 HISTORY — DX: Chronic sinusitis, unspecified: J32.9

## 2015-11-24 HISTORY — DX: Personal history of urinary calculi: Z87.442

## 2015-11-24 LAB — BASIC METABOLIC PANEL
Anion gap: 6 (ref 5–15)
BUN: 12 mg/dL (ref 6–20)
CO2: 30 mmol/L (ref 22–32)
Calcium: 9 mg/dL (ref 8.9–10.3)
Chloride: 103 mmol/L (ref 101–111)
Creatinine, Ser: 0.74 mg/dL (ref 0.44–1.00)
GFR calc Af Amer: >60 mL/min (ref >60)
GFR calc non Af Amer: >60 mL/min (ref >60)
Glucose, Bld: 99 mg/dL (ref 65–99)
Potassium: 4.3 mmol/L (ref 3.5–5.1)
Sodium: 139 mmol/L (ref 135–145)

## 2015-11-24 LAB — CBC
HCT: 41.5 % (ref 36.0–46.0)
Hemoglobin: 13.3 g/dL (ref 12.0–15.0)
MCH: 26.8 pg (ref 26.0–34.0)
MCHC: 32 g/dL (ref 30.0–36.0)
MCV: 83.7 fL (ref 78.0–100.0)
Platelets: 233 10*3/uL (ref 150–400)
RBC: 4.96 MIL/uL (ref 3.87–5.11)
RDW: 14 % (ref 11.5–15.5)
WBC: 10.9 10*3/uL — ABNORMAL HIGH (ref 4.0–10.5)

## 2015-11-24 NOTE — Pre-Procedure Instructions (Addendum)
Samantha [name]  11/24/2015      [address], [place] [address] Phone: [phone] Fax: [phone]   Your procedure is scheduled on 11/27/15  Report to [facility] Admitting at 1100 A.M.  Call this number if you have problems the morning of surgery:  [phone]   Remember:  Do not eat food or drink liquids after midnight.  Take these medicines the morning of surgery with A SIP OF WATER valtrex, doxycycline if still taking  STOP all herbel meds, nsaids (aleve,naproxen,advil,ibuprofen) Starting Today including vitamins, aspirin   Do not wear jewelry, make-up or nail polish.  Do not wear lotions, powders, or perfumes.  You may wear deodorant.  Do not shave 48 hours prior to surgery.  Men may shave face and neck.  Do not bring valuables to the hospital.  [facility] is not responsible for any belongings or valuables.  Contacts, dentures or bridgework may not be worn into surgery.  Leave your suitcase in the car.  After surgery it may be brought to your room.  For patients admitted to the hospital, discharge time will be determined by your treatment team.  Patients discharged the day of surgery will not be allowed to drive home.   Name and phone number of your driver:    Special instructions:   Special Instructions: [place] - Preparing for Surgery  Before surgery, you can play an important role.  Because skin is not sterile, your skin needs to be as free of germs as possible.  You can reduce the number of germs on you skin by washing with CHG (chlorahexidine gluconate) soap before surgery.  CHG is an antiseptic cleaner which kills germs and bonds with the skin to continue killing germs even after washing.  Please DO NOT use if you have an allergy to CHG or antibacterial soaps.  If your skin becomes reddened/irritated stop using the CHG and inform your nurse when you arrive at [facility].  Do not shave (including legs and underarms) for at least 48 hours prior to the first CHG shower.  You may shave your face.  Please follow these instructions carefully:   1.  Shower with CHG Soap the night before surgery and the morning of Surgery.  2.  If you choose to wash your hair, wash your hair first as usual with your normal shampoo.  3.  After you shampoo, rinse your hair and body thoroughly to remove the Shampoo.  4.  Use CHG as you would any other liquid soap.  You can apply chg directly  to the skin and wash gently with scrungie or a clean washcloth.  5.  Apply the CHG Soap to your body ONLY FROM THE NECK DOWN.  Do not use on open wounds or open sores.  Avoid contact with your eyes ears, mouth and genitals (private parts).  Wash genitals (private parts)       with your normal soap.  6.  Wash thoroughly, paying special attention to the area where your surgery will be performed.  7.  Thoroughly rinse your body with warm water from the neck down.  8.  DO NOT shower/wash with your normal soap after using and rinsing off the CHG Soap.  9.  Pat yourself dry with a clean towel.            10.  Wear clean pajamas.  11.  Place clean sheets on your bed the night of your first shower and do not sleep with pets.  Day of Surgery  Do not apply any lotions/deodorants the morning of surgery.  Please wear clean clothes to the hospital/surgery center.  Please read over the following fact sheets that you were given. Pain Booklet, Coughing and Deep Breathing and Surgical Site Infection Prevention

## 2015-11-26 MED ORDER — CLINDAMYCIN PHOSPHATE 900 MG/50ML IV SOLN
900.0000 mg | INTRAVENOUS | Status: AC
  Administered 2015-11-27: 900 mg via INTRAVENOUS
  Filled 2015-11-26: qty 50

## 2015-11-26 MED ORDER — CHLORHEXIDINE GLUCONATE 4 % EX LIQD
60.0000 mL | Freq: Once | CUTANEOUS | Status: DC
Start: 2015-11-27 — End: 2015-11-27

## 2015-11-27 ENCOUNTER — Ambulatory Visit (HOSPITAL_COMMUNITY): Payer: Medicare Other | Admitting: Anesthesiology

## 2015-11-27 ENCOUNTER — Encounter (HOSPITAL_COMMUNITY): Payer: Self-pay | Admitting: *Deleted

## 2015-11-27 ENCOUNTER — Encounter (HOSPITAL_COMMUNITY)
Admission: RE | Disposition: A | Discharge status: Home / Self Care | Payer: Self-pay | Source: Ambulatory Visit | Attending: Orthopedic Surgery

## 2015-11-27 ENCOUNTER — Ambulatory Visit (HOSPITAL_COMMUNITY)
Admission: RE | Admit: 2015-11-27 | Discharge: 2015-11-27 | Disposition: A | Discharge status: Home / Self Care | Payer: Medicare Other | Source: Ambulatory Visit | Attending: Orthopedic Surgery | Admitting: Orthopedic Surgery

## 2015-11-27 DIAGNOSIS — E669 Obesity, unspecified: Secondary | ICD-10-CM | POA: Insufficient documentation

## 2015-11-27 DIAGNOSIS — G8918 Other acute postprocedural pain: Secondary | ICD-10-CM | POA: Diagnosis not present

## 2015-11-27 DIAGNOSIS — M7542 Impingement syndrome of left shoulder: Secondary | ICD-10-CM | POA: Diagnosis not present

## 2015-11-27 DIAGNOSIS — M25512 Pain in left shoulder: Secondary | ICD-10-CM | POA: Diagnosis not present

## 2015-11-27 DIAGNOSIS — M7502 Adhesive capsulitis of left shoulder: Secondary | ICD-10-CM | POA: Diagnosis not present

## 2015-11-27 DIAGNOSIS — G4730 Sleep apnea, unspecified: Secondary | ICD-10-CM | POA: Diagnosis not present

## 2015-11-27 DIAGNOSIS — M7552 Bursitis of left shoulder: Secondary | ICD-10-CM | POA: Diagnosis not present

## 2015-11-27 DIAGNOSIS — S43492A Other sprain of left shoulder joint, initial encounter: Secondary | ICD-10-CM | POA: Insufficient documentation

## 2015-11-27 DIAGNOSIS — Z6832 Body mass index (BMI) 32.0-32.9, adult: Secondary | ICD-10-CM | POA: Diagnosis not present

## 2015-11-27 DIAGNOSIS — X58XXXA Exposure to other specified factors, initial encounter: Secondary | ICD-10-CM | POA: Insufficient documentation

## 2015-11-27 HISTORY — PX: SHOULDER ARTHROSCOPY WITH SUBACROMIAL DECOMPRESSION AND OPEN ROTATOR C: SHX5688

## 2015-11-27 SURGERY — SHOULDER ARTHROSCOPY WITH SUBACROMIAL DECOMPRESSION AND OPEN ROTATOR CUFF REPAIR, OPEN BICEPS TENDON REPAIR
Anesthesia: Regional | Site: Shoulder | Laterality: Left

## 2015-11-27 MED ORDER — KETOROLAC TROMETHAMINE 30 MG/ML IJ SOLN
INTRAMUSCULAR | Status: AC
  Filled 2015-11-27: qty 1

## 2015-11-27 MED ORDER — BUPIVACAINE-EPINEPHRINE (PF) 0.5% -1:200000 IJ SOLN
INTRAMUSCULAR | Status: DC | PRN
  Administered 2015-11-27: 30 mL via PERINEURAL

## 2015-11-27 MED ORDER — LIDOCAINE HCL (CARDIAC) 20 MG/ML IV SOLN
INTRAVENOUS | Status: AC
  Filled 2015-11-27: qty 5

## 2015-11-27 MED ORDER — 0.9 % SODIUM CHLORIDE (POUR BTL) OPTIME
TOPICAL | Status: DC | PRN
Start: 2015-11-27 — End: 2015-11-27
  Administered 2015-11-27: 1000 mL

## 2015-11-27 MED ORDER — METHOCARBAMOL 500 MG PO TABS: 500.0000 mg | ORAL_TABLET | Freq: Three times a day (TID) | ORAL | Status: DC | PRN

## 2015-11-27 MED ORDER — FENTANYL CITRATE (PF) 100 MCG/2ML IJ SOLN
INTRAMUSCULAR | Status: AC
  Administered 2015-11-27: 100 ug via INTRAVENOUS
  Filled 2015-11-27: qty 2

## 2015-11-27 MED ORDER — FENTANYL CITRATE (PF) 100 MCG/2ML IJ SOLN
100.0000 ug | Freq: Once | INTRAMUSCULAR | Status: AC
  Administered 2015-11-27: 100 ug via INTRAVENOUS
  Filled 2015-11-27: qty 2

## 2015-11-27 MED ORDER — PROPOFOL 10 MG/ML IV BOLUS
INTRAVENOUS | Status: AC
  Filled 2015-11-27: qty 20

## 2015-11-27 MED ORDER — BUPIVACAINE-EPINEPHRINE (PF) 0.25% -1:200000 IJ SOLN
INTRAMUSCULAR | Status: AC
  Filled 2015-11-27: qty 30

## 2015-11-27 MED ORDER — PROPOFOL 10 MG/ML IV BOLUS
INTRAVENOUS | Status: DC | PRN
  Administered 2015-11-27: 150 mg via INTRAVENOUS
  Administered 2015-11-27: 50 mg via INTRAVENOUS

## 2015-11-27 MED ORDER — SODIUM CHLORIDE 0.9 % IR SOLN
Status: DC | PRN
  Administered 2015-11-27: 6000 mL

## 2015-11-27 MED ORDER — PHENYLEPHRINE HCL 10 MG/ML IJ SOLN
INTRAMUSCULAR | Status: DC | PRN
  Administered 2015-11-27 (×2): 80 ug via INTRAVENOUS

## 2015-11-27 MED ORDER — ROCURONIUM BROMIDE 50 MG/5ML IV SOLN
INTRAVENOUS | Status: AC
  Filled 2015-11-27: qty 1

## 2015-11-27 MED ORDER — LACTATED RINGERS IV SOLN
INTRAVENOUS | Status: DC
  Administered 2015-11-27: 12:00:00 via INTRAVENOUS

## 2015-11-27 MED ORDER — MIDAZOLAM HCL 2 MG/2ML IJ SOLN
INTRAMUSCULAR | Status: AC
  Administered 2015-11-27: 2 mg via INTRAVENOUS
  Filled 2015-11-27: qty 2

## 2015-11-27 MED ORDER — ONDANSETRON HCL 4 MG/2ML IJ SOLN
INTRAMUSCULAR | Status: DC | PRN
  Administered 2015-11-27: 4 mg via INTRAVENOUS

## 2015-11-27 MED ORDER — OXYCODONE-ACETAMINOPHEN 5-325 MG PO TABS: 1.0000 | ORAL_TABLET | ORAL | Status: DC | PRN

## 2015-11-27 MED ORDER — BUPIVACAINE-EPINEPHRINE 0.25% -1:200000 IJ SOLN
INTRAMUSCULAR | Status: DC | PRN
  Administered 2015-11-27: 7 mL

## 2015-11-27 MED ORDER — LIDOCAINE HCL (CARDIAC) 20 MG/ML IV SOLN
INTRAVENOUS | Status: DC | PRN
  Administered 2015-11-27: 20 mg via INTRAVENOUS

## 2015-11-27 MED ORDER — GLYCOPYRROLATE 0.2 MG/ML IJ SOLN
INTRAMUSCULAR | Status: DC | PRN
  Administered 2015-11-27: 0.4 mg via INTRAVENOUS

## 2015-11-27 MED ORDER — ROCURONIUM BROMIDE 100 MG/10ML IV SOLN
INTRAVENOUS | Status: DC | PRN
  Administered 2015-11-27: 50 mg via INTRAVENOUS

## 2015-11-27 MED ORDER — PHENYLEPHRINE 40 MCG/ML (10ML) SYRINGE FOR IV PUSH (FOR BLOOD PRESSURE SUPPORT)
PREFILLED_SYRINGE | INTRAVENOUS | Status: AC
  Filled 2015-11-27: qty 10

## 2015-11-27 MED ORDER — FENTANYL CITRATE (PF) 250 MCG/5ML IJ SOLN
INTRAMUSCULAR | Status: AC
  Filled 2015-11-27: qty 5

## 2015-11-27 MED ORDER — GLYCOPYRROLATE 0.2 MG/ML IJ SOLN
INTRAMUSCULAR | Status: AC
  Filled 2015-11-27: qty 2

## 2015-11-27 MED ORDER — SCOPOLAMINE 1 MG/3DAYS TD PT72
1.0000 | MEDICATED_PATCH | TRANSDERMAL | Status: DC
  Administered 2015-11-27: 1.5 mg via TRANSDERMAL

## 2015-11-27 MED ORDER — ONDANSETRON HCL 4 MG/2ML IJ SOLN
INTRAMUSCULAR | Status: AC
  Filled 2015-11-27: qty 2

## 2015-11-27 MED ORDER — NEOSTIGMINE METHYLSULFATE 10 MG/10ML IV SOLN
INTRAVENOUS | Status: DC | PRN
  Administered 2015-11-27: 3 mg via INTRAVENOUS

## 2015-11-27 MED ORDER — MIDAZOLAM HCL 2 MG/2ML IJ SOLN
2.0000 mg | Freq: Once | INTRAMUSCULAR | Status: AC
  Administered 2015-11-27: 2 mg via INTRAVENOUS
  Filled 2015-11-27: qty 2

## 2015-11-27 MED ORDER — KETOROLAC TROMETHAMINE 30 MG/ML IJ SOLN
INTRAMUSCULAR | Status: DC | PRN
  Administered 2015-11-27: 30 mg via INTRAVENOUS

## 2015-11-27 MED ORDER — SCOPOLAMINE 1 MG/3DAYS TD PT72
MEDICATED_PATCH | TRANSDERMAL | Status: AC
  Administered 2015-11-27: 1.5 mg via TRANSDERMAL
  Filled 2015-11-27: qty 1

## 2015-11-27 MED ORDER — FENTANYL CITRATE (PF) 100 MCG/2ML IJ SOLN
INTRAMUSCULAR | Status: DC | PRN
  Administered 2015-11-27 (×2): 50 ug via INTRAVENOUS

## 2015-11-27 SURGICAL SUPPLY — 57 items
BLADE SURG 11 STRL SS (BLADE) ×3 IMPLANT
BLADE W/14.0X25.5MM (BLADE) ×3 IMPLANT
BUR OVAL 4.0 (BURR) ×3 IMPLANT
CLOSURE WOUND 1/2 X4 (GAUZE/BANDAGES/DRESSINGS) ×1
COVER SURGICAL LIGHT HANDLE (MISCELLANEOUS) ×3 IMPLANT
DRAPE INCISE IOBAN 66X45 STRL (DRAPES) ×3 IMPLANT
DRAPE STERI 35X30 U-POUCH (DRAPES) ×3 IMPLANT
DRAPE U-SHAPE 47X51 STRL (DRAPES) ×3 IMPLANT
DRSG EMULSION OIL 3X3 NADH (GAUZE/BANDAGES/DRESSINGS) ×3 IMPLANT
DRSG PAD ABDOMINAL 8X10 ST (GAUZE/BANDAGES/DRESSINGS) ×3 IMPLANT
DURAPREP 26ML APPLICATOR (WOUND CARE) ×3 IMPLANT
ELECT NEEDLE TIP 2.8 STRL (NEEDLE) ×3 IMPLANT
ELECT REM PT RETURN 9FT ADLT (ELECTROSURGICAL)
ELECTRODE REM PT RTRN 9FT ADLT (ELECTROSURGICAL) IMPLANT
GAUZE SPONGE 4X4 12PLY STRL (GAUZE/BANDAGES/DRESSINGS) ×3 IMPLANT
GLOVE BIOGEL PI ORTHO PRO 7.5 (GLOVE) ×2
GLOVE BIOGEL PI ORTHO PRO SZ8 (GLOVE) ×2
GLOVE ORTHO TXT STRL SZ7.5 (GLOVE) ×3 IMPLANT
GLOVE PI ORTHO PRO STRL 7.5 (GLOVE) ×1 IMPLANT
GLOVE PI ORTHO PRO STRL SZ8 (GLOVE) ×1 IMPLANT
GLOVE SURG ORTHO 8.5 STRL (GLOVE) ×3 IMPLANT
GOWN STRL REUS W/ TWL XL LVL3 (GOWN DISPOSABLE) ×4 IMPLANT
GOWN STRL REUS W/TWL XL LVL3 (GOWN DISPOSABLE) ×8
KIT BASIN OR (CUSTOM PROCEDURE TRAY) ×3 IMPLANT
KIT ROOM TURNOVER OR (KITS) ×3 IMPLANT
MANIFOLD NEPTUNE II (INSTRUMENTS) ×3 IMPLANT
NDL SUT .5 MAYO 1.404X.05X (NEEDLE) ×1 IMPLANT
NDL SUT 6 .5 CRC .975X.05 MAYO (NEEDLE) ×1 IMPLANT
NEEDLE HYPO 25GX1X1/2 BEV (NEEDLE) ×3 IMPLANT
NEEDLE MAYO TAPER (NEEDLE) ×4
NEEDLE SPNL 18GX3.5 QUINCKE PK (NEEDLE) ×3 IMPLANT
NS IRRIG 1000ML POUR BTL (IV SOLUTION) ×3 IMPLANT
PACK SHOULDER (CUSTOM PROCEDURE TRAY) ×3 IMPLANT
PAD ARMBOARD 7.5X6 YLW CONV (MISCELLANEOUS) ×6 IMPLANT
RESECTOR FULL RADIUS 4.2MM (BLADE) ×3 IMPLANT
SET ARTHROSCOPY TUBING (MISCELLANEOUS) ×2
SET ARTHROSCOPY TUBING LN (MISCELLANEOUS) ×1 IMPLANT
SLING ARM LRG ADULT FOAM STRAP (SOFTGOODS) IMPLANT
SLING ARM MED ADULT FOAM STRAP (SOFTGOODS) IMPLANT
STRIP CLOSURE SKIN 1/2X4 (GAUZE/BANDAGES/DRESSINGS) ×2 IMPLANT
SUT BONE WAX W31G (SUTURE) ×3 IMPLANT
SUT FIBERWIRE #2 38 T-5 BLUE (SUTURE)
SUT MNCRL AB 3-0 PS2 18 (SUTURE) ×3 IMPLANT
SUT VIC AB 0 CT1 27 (SUTURE) ×2
SUT VIC AB 0 CT1 27XBRD ANBCTR (SUTURE) ×1 IMPLANT
SUT VIC AB 0 CT2 27 (SUTURE) IMPLANT
SUT VIC AB 2-0 CT1 27 (SUTURE) ×2
SUT VIC AB 2-0 CT1 TAPERPNT 27 (SUTURE) ×1 IMPLANT
SUT VICRYL 0 CT 1 36IN (SUTURE) ×3 IMPLANT
SUTURE FIBERWR #2 38 T-5 BLUE (SUTURE) IMPLANT
SYR CONTROL 10ML LL (SYRINGE) ×3 IMPLANT
TOWEL OR 17X24 6PK STRL BLUE (TOWEL DISPOSABLE) ×3 IMPLANT
TOWEL OR 17X26 10 PK STRL BLUE (TOWEL DISPOSABLE) ×3 IMPLANT
TUBE CONNECTING 12'X1/4 (SUCTIONS) ×1
TUBE CONNECTING 12X1/4 (SUCTIONS) ×2 IMPLANT
WAND HAND CNTRL MULTIVAC 90 (MISCELLANEOUS) ×3 IMPLANT
WATER STERILE IRR 1000ML POUR (IV SOLUTION) ×3 IMPLANT

## 2015-11-27 NOTE — Anesthesia Preprocedure Evaluation (Addendum)
Anesthesia Evaluation  Patient identified by MRN, date of birth, ID band Patient awake    Reviewed: Allergy & Precautions, NPO status , Patient's Chart, lab work & pertinent test results  Airway Mallampati: II  TM Distance: >3 FB Neck ROM: Full    Dental no notable dental hx. (+) Teeth Intact   Pulmonary sleep apnea , pneumonia, resolved,    Pulmonary exam normal breath sounds clear to auscultation       Cardiovascular negative cardio ROS Normal cardiovascular exam Rhythm:Regular Rate:Normal     Neuro/Psych negative neurological ROS  negative psych ROS   GI/Hepatic negative GI ROS, Neg liver ROS,   Endo/Other  negative endocrine ROS  Renal/GU negative Renal ROS  negative genitourinary   Musculoskeletal negative musculoskeletal ROS (+)   Abdominal (+) + obese,   Peds negative pediatric ROS (+)  Hematology negative hematology ROS (+) anemia ,   Anesthesia Other Findings   Reproductive/Obstetrics negative OB ROS                            Anesthesia Physical Anesthesia Plan  ASA: II  Anesthesia Plan:    Post-op Pain Management: GA combined w/ Regional for post-op pain   Induction: Intravenous  Airway Management Planned: Oral ETT  Additional Equipment:   Intra-op Plan:   Post-operative Plan: Extubation in OR  Informed Consent: I have reviewed the patients History and Physical, chart, labs and discussed the procedure including the risks, benefits and alternatives for the proposed anesthesia with the patient or authorized representative who has indicated his/her understanding and acceptance.   Dental advisory given  Plan Discussed with: Anesthesiologist  Anesthesia Plan Comments:         Anesthesia Quick Evaluation

## 2015-11-27 NOTE — Anesthesia Procedure Notes (Addendum)
Procedure Name: Intubation Date/Time: 11/27/2015 1:25 PM Performed by: Rush Farmer E Pre-anesthesia Checklist: Patient identified, Emergency Drugs available, Suction available, Patient being monitored and Timeout performed Patient Re-evaluated:Patient Re-evaluated prior to inductionOxygen Delivery Method: Circle system utilized Preoxygenation: Pre-oxygenation with 100% oxygen Intubation Type: IV induction Ventilation: Mask ventilation without difficulty Laryngoscope Size: Mac and 3 Grade View: Grade I Tube type: Oral Tube size: 7.0 mm Number of attempts: 1 Airway Equipment and Method: Stylet Placement Confirmation: ETT inserted through vocal cords under direct vision,  positive ETCO2 and breath sounds checked- equal and bilateral Secured at: 22 cm Tube secured with: Tape Dental Injury: Teeth and Oropharynx as per pre-operative assessment    Anesthesia Regional Block:  Interscalene brachial plexus block  Pre-Anesthetic Checklist: ,, timeout performed, Correct Patient, Correct Site, Correct Laterality, Correct Procedure, Correct Position, site marked, Risks and benefits discussed, Surgical consent,  Pre-op evaluation,  Post-op pain management  Laterality: Left  Prep: chloraprep       Needles:  Injection technique: Single-shot  Needle Type: Stimulator Needle - 40     Needle Length: 4cm 4 cm Needle Gauge: 22 and 22 G    Additional Needles:  Procedures: ultrasound guided (picture in chart) Interscalene brachial plexus block Narrative:  Injection made incrementally with aspirations every 5 mL.  Performed by: Personally  Anesthesiologist: Nolon Nations  Additional Notes: BP cuff, EKG monitors applied. Sedation begun. Nerve location verified with U/S. Anesthetic injected incrementally, slowly , and after neg aspirations under direct u/s guidance. Good perineural spread. Tolerated well.

## 2015-11-27 NOTE — Discharge Instructions (Signed)
Ice to the shoulder as much as possible.  Do exercises to keep from getting stiff every hour, starting today. Do those for 5 minutes.  Pendulums Lap Slides Door hinge rotation exercises - hug yourself then turn thumb out and hitchhike  Ok to remove the sling and hug a pillow while in the house .  Use sling out of the home.  Follow up in two weeks  [phone]

## 2015-11-27 NOTE — Brief Op Note (Signed)
11/27/2015  3:14 PM  PATIENT:  Samantha [name]  34 y.o. female  PRE-OPERATIVE DIAGNOSIS:  LEFT SHOULDER PARTIAL ROTATOR CUFF TEAR, IMPINGEMENT  POST-OPERATIVE DIAGNOSIS:  LEFT SHOULDER IMPINGEMENT, BURSITIS  PROCEDURE:  LEFT SHOULDER ARTHROSCOPY WITH LIMITED INTRA-ARTICULAR DEBRIDEMENT, SUB-ACROMIAL DECOMPRESSION,   SURGEON:  Surgeon(s) and Role:    * Netta Cedars, MD - Primary  PHYSICIAN ASSISTANT:   ASSISTANTS: Ventura Bruns, PA-C   ANESTHESIA:   regional and general  EBL:  Total I/O In: 700 [I.V.:700] Out: 50 [Blood:50]  BLOOD ADMINISTERED:none  DRAINS: none   LOCAL MEDICATIONS USED:  MARCAINE     SPECIMEN:  No Specimen  DISPOSITION OF SPECIMEN:  N/A  COUNTS:  YES  TOURNIQUET:  * No tourniquets in log *  DICTATION: .Other Dictation: Dictation Number [id number]  PLAN OF CARE: Discharge to home after PACU  PATIENT DISPOSITION:  PACU - hemodynamically stable.   Delay start of Pharmacological VTE agent (>24hrs) due to surgical blood loss or risk of bleeding: not applicable

## 2015-11-27 NOTE — Op Note (Signed)
NAMEMarland Kitchen  Samantha [name], Samantha [name]                 ACCOUNT NO.:  [id number]  MEDICAL RECORD NO.:  [id number]  LOCATION:  [facility]                         FACILITY:  [facility]  PHYSICIAN:  Doran Heater. Veverly Fells, M.D. DATE OF BIRTH:  [birth year]  DATE OF PROCEDURE:  11/27/2015 DATE OF DISCHARGE:                              OPERATIVE REPORT   PREOPERATIVE DIAGNOSIS:  Left shoulder pain secondary to chronic impingement with suspected partial-thickness rotator cuff tear.  POSTOPERATIVE DIAGNOSIS:  Left shoulder impingement and bursitis, no sign of rotator cuff tear.  PROCEDURE PERFORMED:  Left shoulder arthroscopy, limited intra-articular debridement of frayed anterior labrum.  Arthroscopic subacromial decompression and bursectomy and did a limited mini-open incision to verify no rotator cuff tear, followed by primary closure.  ATTENDING SURGEON:  Doran Heater. Veverly Fells, M.D.  ASSISTANT:  Abbott Pao. Dixon, PA-C, who was scrubbed the entire procedure and necessary for satisfactory completion of surgery.  ANESTHESIA:  General anesthesia was used plus interscalene block.  ESTIMATED BLOOD LOSS:  Minimal.  FLUID REPLACEMENT:  1200 mL crystalloid.  INSTRUMENT COUNTS:  Correct.  COMPLICATIONS:  There were no complications.  ANTIBIOTICS:  Perioperative antibiotics.  OPERATIVE INDICATIONS:  The patient is a 34 year old female with worsening left shoulder pain secondary to suspected partial-thickness rotator cuff tear and impingement secondary to severely laterally overhanging acromion with significant lateral downsloping, the patient's subacromial intervals very small laterally.  Due to this anatomy and evidence on MRI scan, a potential partial-thickness rotator cuff tear, the patient elected to proceed with surgical management with conservative methods failed.  Risks and benefits of surgery discussed, informed consent obtained.  DESCRIPTION OF PROCEDURE:  After an adequate level of anesthesia achieved, the patient  was positioned in a modified beach-chair position. Left shoulder correctly identified and examined under anesthesia.  Full passive range of motion was noted with no undue stiffness and instability.  Sterile prep and drape of the shoulder and arm.  Time-out done, we verified correct patient, correct site, we then initiated surgery with standard arthroscopic portals including anterior, posterior, and lateral portals.  Identified significant inflammation within the glenohumeral joint.  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  The anterior labrum did have a tear, and there was a sub labral hole and cord-like middle glenohumeral ligament.  We debrided that anterior labral fraying including humeral articular cartilage in good shape.  No significant chondromalacia noted.  The rotator cuff was completely normal as visualized from posterior and then the anterior portal.  We placed a scope in subacromial space.  It was very tight, quite a bit of bursitis, no equina bursitis.  We then used the ArthroCare unit to remove soft tissue from the underside of the acromion and correctly identify acromion morphology.  We did an aggressive acromioplasty removing the anterior hook as well as the lateral downsloping and the impinging, we increase the distance between the acromion and the greater tuberosity by a minimum of bur width which was about 4 mm.  I think this will do wonders for her in terms of limiting impingement.  We then ranged the shoulder, no impingement was noted.  We irrigated thoroughly.  I  did palpate and visualize the cuff from the bursal surface.  I did not feel like there was a rotator cuff tear, but we want to be sure as the MRI was concerning for that in the infraspinatus potential partial tear, so I did go and make a mini-open incision started at the anterolateral border of the acromion and staying distally about 3-4 cm dissection  down through subcutaneous tissues.  Split the deltoid bluntly in line with its fibers.  Was able to place retractors and placed my finger and palpate the infraspinatus and supraspinatus as I rotated the shoulder through full arc of motion.  No vulnerable area or soft spot could be identified.  We irrigated the subdeltoid interval and repaired deltoid anatomically with 0 Vicryl suture side to side, followed by 2-0 Vicryl subcutaneous closure, and 4-0 Monocryl for skin and portals.  Steri- Strips applied, followed by sterile dressing.  The patient tolerated the surgery well.     Doran Heater. Veverly Fells, M.D.     SRN/MEDQ  D:  11/27/2015  T:  11/27/2015  Job:  [id number]

## 2015-11-27 NOTE — Interval H&P Note (Signed)
History and Physical Interval Note:  11/27/2015 12:54 PM  Samantha [name]  has presented today for surgery, with the diagnosis of LEFT SHOULDER PARTIAL ROTATOR CUFF TEAR  The various methods of treatment have been discussed with the patient and family. After consideration of risks, benefits and other options for treatment, the patient has consented to  Procedure(s): LEFT SHOULDER ARTHROSCOPY WITH SUBACROMIAL DECOMPRESSION, MINI OPEN ROTATOR CUFF REPAIR (Left) as a surgical intervention .  The patient's history has been reviewed, patient examined, no change in status, stable for surgery.  I have reviewed the patient's chart and labs.  Questions were answered to the patient's satisfaction.     Wania Longstreth,STEVEN R

## 2015-11-27 NOTE — Transfer of Care (Signed)
Immediate Anesthesia Transfer of Care Note  Patient: Samantha [name]  Procedure(s) Performed: Procedure(s): LEFT SHOULDER ARTHROSCOPY WITH SUBACROMIAL DECOMPRESSION, MINI OPEN ROTATOR CUFF REPAIR (Left)  Patient Location: PACU  Anesthesia Type:General and Regional  Level of Consciousness: awake, alert  and oriented  Airway & Oxygen Therapy: Patient Spontanous Breathing  Post-op Assessment: Report given to RN, Post -op Vital signs reviewed and stable and Patient moving all extremities X 4  Post vital signs: Reviewed and stable  Last Vitals:  Filed Vitals:   11/27/15 1225 11/27/15 1230  BP: 141/72 138/73  Pulse: 85 89  Temp:    Resp: 18 14    Complications: No apparent anesthesia complications

## 2015-11-27 NOTE — Anesthesia Postprocedure Evaluation (Signed)
Anesthesia Post Note  Patient: Samantha [name]  Procedure(s) Performed: Procedure(s) (LRB): LEFT SHOULDER ARTHROSCOPY WITH SUBACROMIAL DECOMPRESSION, MINI OPEN ROTATOR CUFF REPAIR (Left)  Patient location during evaluation: PACU Anesthesia Type: General Level of consciousness: sedated and patient cooperative Pain management: pain level controlled Vital Signs Assessment: post-procedure vital signs reviewed and stable Respiratory status: spontaneous breathing Cardiovascular status: stable Anesthetic complications: no    Last Vitals:  Filed Vitals:   11/27/15 1517 11/27/15 1530  BP: 141/93   Pulse: 88 100  Temp: 36.2 C   Resp: 11 20    Last Pain:  Filed Vitals:   11/27/15 1547  PainSc: 0-No pain                 Nolon Nations

## 2015-12-01 ENCOUNTER — Encounter (HOSPITAL_COMMUNITY): Payer: Self-pay | Admitting: Orthopedic Surgery

## 2015-12-01 DIAGNOSIS — M75112 Incomplete rotator cuff tear or rupture of left shoulder, not specified as traumatic: Secondary | ICD-10-CM | POA: Diagnosis not present

## 2015-12-04 DIAGNOSIS — M75112 Incomplete rotator cuff tear or rupture of left shoulder, not specified as traumatic: Secondary | ICD-10-CM | POA: Diagnosis not present

## 2015-12-08 DIAGNOSIS — Z4789 Encounter for other orthopedic aftercare: Secondary | ICD-10-CM | POA: Diagnosis not present

## 2015-12-08 DIAGNOSIS — M75112 Incomplete rotator cuff tear or rupture of left shoulder, not specified as traumatic: Secondary | ICD-10-CM | POA: Diagnosis not present

## 2015-12-10 DIAGNOSIS — M75112 Incomplete rotator cuff tear or rupture of left shoulder, not specified as traumatic: Secondary | ICD-10-CM | POA: Diagnosis not present

## 2015-12-17 DIAGNOSIS — M75112 Incomplete rotator cuff tear or rupture of left shoulder, not specified as traumatic: Secondary | ICD-10-CM | POA: Diagnosis not present

## 2015-12-22 DIAGNOSIS — M75112 Incomplete rotator cuff tear or rupture of left shoulder, not specified as traumatic: Secondary | ICD-10-CM | POA: Diagnosis not present

## 2015-12-24 DIAGNOSIS — M75112 Incomplete rotator cuff tear or rupture of left shoulder, not specified as traumatic: Secondary | ICD-10-CM | POA: Diagnosis not present

## 2015-12-29 DIAGNOSIS — K529 Noninfective gastroenteritis and colitis, unspecified: Secondary | ICD-10-CM | POA: Diagnosis not present

## 2015-12-29 DIAGNOSIS — E118 Type 2 diabetes mellitus with unspecified complications: Secondary | ICD-10-CM | POA: Insufficient documentation

## 2015-12-29 DIAGNOSIS — Q040 Congenital malformations of corpus callosum: Secondary | ICD-10-CM | POA: Diagnosis not present

## 2015-12-29 DIAGNOSIS — F319 Bipolar disorder, unspecified: Secondary | ICD-10-CM | POA: Diagnosis not present

## 2015-12-29 DIAGNOSIS — E119 Type 2 diabetes mellitus without complications: Secondary | ICD-10-CM | POA: Diagnosis not present

## 2016-01-05 DIAGNOSIS — Z4789 Encounter for other orthopedic aftercare: Secondary | ICD-10-CM | POA: Diagnosis not present

## 2016-01-06 DIAGNOSIS — M75112 Incomplete rotator cuff tear or rupture of left shoulder, not specified as traumatic: Secondary | ICD-10-CM | POA: Diagnosis not present

## 2016-01-20 DIAGNOSIS — R197 Diarrhea, unspecified: Secondary | ICD-10-CM | POA: Diagnosis not present

## 2016-01-21 DIAGNOSIS — M75112 Incomplete rotator cuff tear or rupture of left shoulder, not specified as traumatic: Secondary | ICD-10-CM | POA: Diagnosis not present

## 2016-01-26 DIAGNOSIS — F3132 Bipolar disorder, current episode depressed, moderate: Secondary | ICD-10-CM | POA: Diagnosis not present

## 2016-02-03 DIAGNOSIS — R1030 Lower abdominal pain, unspecified: Secondary | ICD-10-CM | POA: Diagnosis not present

## 2016-02-04 DIAGNOSIS — H1013 Acute atopic conjunctivitis, bilateral: Secondary | ICD-10-CM | POA: Diagnosis not present

## 2016-02-04 DIAGNOSIS — H40033 Anatomical narrow angle, bilateral: Secondary | ICD-10-CM | POA: Diagnosis not present

## 2016-02-16 DIAGNOSIS — Z4789 Encounter for other orthopedic aftercare: Secondary | ICD-10-CM | POA: Diagnosis not present

## 2016-02-17 DIAGNOSIS — M75112 Incomplete rotator cuff tear or rupture of left shoulder, not specified as traumatic: Secondary | ICD-10-CM | POA: Diagnosis not present

## 2016-02-22 DIAGNOSIS — Q040 Congenital malformations of corpus callosum: Secondary | ICD-10-CM | POA: Diagnosis not present

## 2016-02-22 DIAGNOSIS — F319 Bipolar disorder, unspecified: Secondary | ICD-10-CM | POA: Diagnosis not present

## 2016-02-22 DIAGNOSIS — E119 Type 2 diabetes mellitus without complications: Secondary | ICD-10-CM | POA: Diagnosis not present

## 2016-02-22 DIAGNOSIS — K760 Fatty (change of) liver, not elsewhere classified: Secondary | ICD-10-CM | POA: Diagnosis not present

## 2016-02-22 DIAGNOSIS — L650 Telogen effluvium: Secondary | ICD-10-CM | POA: Diagnosis not present

## 2016-02-22 DIAGNOSIS — G4733 Obstructive sleep apnea (adult) (pediatric): Secondary | ICD-10-CM | POA: Diagnosis not present

## 2016-02-22 DIAGNOSIS — J302 Other seasonal allergic rhinitis: Secondary | ICD-10-CM | POA: Diagnosis not present

## 2016-02-23 DIAGNOSIS — M75112 Incomplete rotator cuff tear or rupture of left shoulder, not specified as traumatic: Secondary | ICD-10-CM | POA: Diagnosis not present

## 2016-02-24 DIAGNOSIS — R197 Diarrhea, unspecified: Secondary | ICD-10-CM | POA: Diagnosis not present

## 2016-02-25 DIAGNOSIS — M75112 Incomplete rotator cuff tear or rupture of left shoulder, not specified as traumatic: Secondary | ICD-10-CM | POA: Diagnosis not present

## 2016-02-29 DIAGNOSIS — M75112 Incomplete rotator cuff tear or rupture of left shoulder, not specified as traumatic: Secondary | ICD-10-CM | POA: Diagnosis not present

## 2016-03-03 DIAGNOSIS — M75112 Incomplete rotator cuff tear or rupture of left shoulder, not specified as traumatic: Secondary | ICD-10-CM | POA: Diagnosis not present

## 2016-03-10 DIAGNOSIS — M75112 Incomplete rotator cuff tear or rupture of left shoulder, not specified as traumatic: Secondary | ICD-10-CM | POA: Diagnosis not present

## 2016-03-14 DIAGNOSIS — M75112 Incomplete rotator cuff tear or rupture of left shoulder, not specified as traumatic: Secondary | ICD-10-CM | POA: Diagnosis not present

## 2016-03-22 DIAGNOSIS — Z4789 Encounter for other orthopedic aftercare: Secondary | ICD-10-CM | POA: Diagnosis not present

## 2016-04-14 DIAGNOSIS — M75112 Incomplete rotator cuff tear or rupture of left shoulder, not specified as traumatic: Secondary | ICD-10-CM | POA: Diagnosis not present

## 2016-04-18 DIAGNOSIS — L02211 Cutaneous abscess of abdominal wall: Secondary | ICD-10-CM | POA: Diagnosis not present

## 2016-04-19 DIAGNOSIS — F3132 Bipolar disorder, current episode depressed, moderate: Secondary | ICD-10-CM | POA: Diagnosis not present

## 2016-04-27 DIAGNOSIS — M7502 Adhesive capsulitis of left shoulder: Secondary | ICD-10-CM | POA: Diagnosis not present

## 2016-04-27 DIAGNOSIS — Z4789 Encounter for other orthopedic aftercare: Secondary | ICD-10-CM | POA: Diagnosis not present

## 2016-04-27 DIAGNOSIS — M75112 Incomplete rotator cuff tear or rupture of left shoulder, not specified as traumatic: Secondary | ICD-10-CM | POA: Diagnosis not present

## 2016-06-30 DIAGNOSIS — M791 Myalgia: Secondary | ICD-10-CM | POA: Diagnosis not present

## 2016-06-30 DIAGNOSIS — E119 Type 2 diabetes mellitus without complications: Secondary | ICD-10-CM | POA: Diagnosis not present

## 2016-06-30 DIAGNOSIS — M2550 Pain in unspecified joint: Secondary | ICD-10-CM | POA: Diagnosis not present

## 2016-06-30 DIAGNOSIS — M899 Disorder of bone, unspecified: Secondary | ICD-10-CM | POA: Diagnosis not present

## 2016-07-11 ENCOUNTER — Emergency Department (HOSPITAL_COMMUNITY): Payer: Medicare Other

## 2016-07-11 ENCOUNTER — Encounter (HOSPITAL_COMMUNITY): Payer: Self-pay | Admitting: Emergency Medicine

## 2016-07-11 ENCOUNTER — Emergency Department (HOSPITAL_COMMUNITY)
Admission: EM | Admit: 2016-07-11 | Discharge: 2016-07-11 | Disposition: A | Discharge status: Home / Self Care | Payer: Medicare Other | Attending: Emergency Medicine | Admitting: Emergency Medicine

## 2016-07-11 DIAGNOSIS — R51 Headache: Secondary | ICD-10-CM | POA: Diagnosis not present

## 2016-07-11 DIAGNOSIS — R519 Headache, unspecified: Secondary | ICD-10-CM

## 2016-07-11 LAB — I-STAT CHEM 8, ED
BUN: 10 mg/dL (ref 6–20)
Calcium, Ion: 1.15 mmol/L (ref 1.13–1.30)
Chloride: 99 mmol/L — ABNORMAL LOW (ref 101–111)
Creatinine, Ser: 0.8 mg/dL (ref 0.44–1.00)
Glucose, Bld: 89 mg/dL (ref 65–99)
HCT: 41 % (ref 36.0–46.0)
Hemoglobin: 13.9 g/dL (ref 12.0–15.0)
Potassium: 4.3 mmol/L (ref 3.5–5.1)
Sodium: 139 mmol/L (ref 135–145)
TCO2: 29 mmol/L (ref 0–100)

## 2016-07-11 LAB — I-STAT BETA HCG BLOOD, ED (MC, WL, AP ONLY): I-stat hCG, quantitative: <5 m[IU]/mL (ref <5)

## 2016-07-11 MED ORDER — METHYLPREDNISOLONE SODIUM SUCC 125 MG IJ SOLR
125.0000 mg | Freq: Once | INTRAMUSCULAR | Status: AC
  Administered 2016-07-11: 125 mg via INTRAVENOUS
  Filled 2016-07-11: qty 2

## 2016-07-11 MED ORDER — PROCHLORPERAZINE EDISYLATE 5 MG/ML IJ SOLN
10.0000 mg | Freq: Once | INTRAMUSCULAR | Status: AC
  Administered 2016-07-11: 10 mg via INTRAVENOUS
  Filled 2016-07-11: qty 2

## 2016-07-11 MED ORDER — DIPHENHYDRAMINE HCL 50 MG/ML IJ SOLN
25.0000 mg | Freq: Once | INTRAMUSCULAR | Status: AC
  Administered 2016-07-11: 25 mg via INTRAVENOUS
  Filled 2016-07-11: qty 1

## 2016-07-11 MED ORDER — METHOCARBAMOL 500 MG PO TABS: 1000.0000 mg | ORAL_TABLET | Freq: Four times a day (QID) | ORAL | 0 refills | Status: DC | PRN

## 2016-07-11 MED ORDER — SODIUM CHLORIDE 0.9 % IV BOLUS (SEPSIS)
1000.0000 mL | Freq: Once | INTRAVENOUS | Status: AC
  Administered 2016-07-11: 1000 mL via INTRAVENOUS

## 2016-07-11 MED ORDER — HALOPERIDOL LACTATE 5 MG/ML IJ SOLN
2.5000 mg | Freq: Once | INTRAMUSCULAR | Status: AC
  Administered 2016-07-11: 2.5 mg via INTRAVENOUS
  Filled 2016-07-11: qty 1

## 2016-07-11 MED ORDER — KETOROLAC TROMETHAMINE 30 MG/ML IJ SOLN
30.0000 mg | Freq: Once | INTRAMUSCULAR | Status: AC
  Administered 2016-07-11: 30 mg via INTRAVENOUS
  Filled 2016-07-11: qty 1

## 2016-07-11 MED ORDER — MAGNESIUM SULFATE 2 GM/50ML IV SOLN
2.0000 g | Freq: Once | INTRAVENOUS | Status: AC
  Administered 2016-07-11: 2 g via INTRAVENOUS
  Filled 2016-07-11: qty 50

## 2016-07-11 NOTE — ED Notes (Signed)
Assisted patient back to room from bathroom; now patient is being taken to CT; visitor will remain in room

## 2016-07-11 NOTE — ED Notes (Signed)
Patient has returned from bathroom and placed back on continuous pulse oximetry; family at bedside

## 2016-07-11 NOTE — Progress Notes (Signed)
Patient complains of severe h/a for past 4 days, which has made patient feel nauseous. No c/o of dizziness, neck stiffness, or problems with vision and speech.

## 2016-07-11 NOTE — ED Notes (Signed)
Patient transported to CT 

## 2016-07-11 NOTE — ED Provider Notes (Signed)
[facility] DEPT Provider Note   CSN: [id number] Arrival date & time: 07/11/16  1100  First Provider Contact:  First MD Initiated Contact with Patient 07/11/16 1401        History   Chief Complaint Chief Complaint  Patient presents with  . Migraine    HPI  Blood pressure 136/71, pulse 73, temperature 98.3 F (36.8 C), temperature source Oral, resp. rate 16, height 5' 9.5" (1.765 m), weight 99.3 kg, last menstrual period 08/05/2014, SpO2 99 %.  Samantha [name] is a 35 y.o. female complaining of exacerbation onset 4 days ago, pain is bandlike, consistent with prior episodes of headache however she taken multiple over-the-counter pain medications with little relief. Has an appointment with primary care back pain is severe, 10 out of 10 and states she can't wait that long. Pt denies fever, rash, confusion, cervicalgia, LOC/syncope, change in vision, N/V, numbness, weakness, dysarthria, ataxia, thunderclap onset, exacerbation with exertion or valsalva, exacerbation in morning, CP, SOB, abdominal pain.  HPI  Past Medical History:  Diagnosis Date  . Anemia   . Anxiety   . Depression    rx presc but not taken  . GERD (gastroesophageal reflux disease)    no meds  . Headache(784.0)    tension and with anxiety hx  . History of kidney stones   . MVP (mitral valve prolapse)    as a child  . MVP (mitral valve prolapse)    echo 10  . Pneumonia    hx  . Seizures ([place])    non epileptic events per epilepsy monitoring unit  . Seizures ([place])    last seizure July 2015- does not convulse but has a sleep effect  . Sinus infection    recent on antibiotic  . Sleep apnea    has cpap does not use    Patient Active Problem List   Diagnosis Date Noted  . S/P abdominal hysterectomy 08/15/2014    Past Surgical History:  Procedure Laterality Date  . ABDOMINAL HYSTERECTOMY N/A 08/15/2014   Procedure: HYSTERECTOMY ABDOMINAL WITH CYSTOSCOPY;  Surgeon: Sanjuana Kava, MD;  Location: [facility];   Service: Gynecology;  Laterality: N/A;  . CESAREAN SECTION N/A   . SHOULDER ARTHROSCOPY WITH SUBACROMIAL DECOMPRESSION AND OPEN ROTATOR C Left 11/27/2015   Procedure: LEFT SHOULDER ARTHROSCOPY WITH SUBACROMIAL DECOMPRESSION, MINI OPEN ROTATOR CUFF REPAIR;  Surgeon: Netta Cedars, MD;  Location: [facility];  Service: Orthopedics;  Laterality: Left;  . TUBAL LIGATION      OB History    No data available       Home Medications    Prior to Admission medications   Medication Sig Start Date End Date Taking? Authorizing Provider  doxycycline (VIBRA-TABS) 100 MG tablet Take 1 tablet by mouth 2 (two) times daily. 11/17/15   Historical Provider, MD  ibuprofen (ADVIL,MOTRIN) 800 MG tablet Take 1 tablet (800 mg total) by mouth every 8 (eight) hours as needed for mild pain or moderate pain. 02/13/15   Clayton Bibles, PA-C  methocarbamol (ROBAXIN) 500 MG tablet Take 1 tablet (500 mg total) by mouth 3 (three) times daily as needed. 11/27/15   Netta Cedars, MD  oxyCODONE-acetaminophen (ROXICET) 5-325 MG tablet Take 1-2 tablets by mouth every 4 (four) hours as needed for severe pain. 11/27/15   Netta Cedars, MD  valACYclovir (VALTREX) 500 MG tablet Take 500 mg by mouth daily.    Historical Provider, MD    Family History No family history on file.  Social History Social History  Substance  Use Topics  . Smoking status: Never Smoker  . Smokeless tobacco: Never Used  . Alcohol use No     Allergies   Sulfamethoxazole-trimethoprim and Penicillins   Review of Systems Review of Systems  10 systems reviewed and found to be negative, except as noted in the HPI.   Physical Exam Updated Vital Signs BP 136/71 (BP Location: Left Arm)   Pulse 73   Temp 98.3 F (36.8 C) (Oral)   Resp 16   Ht 5' 9.5" (1.765 m)   Wt 99.3 kg   LMP 08/05/2014   SpO2 99%   BMI 31.88 kg/m   Physical Exam  Constitutional: She is oriented to person, place, and time. She appears well-developed and well-nourished.  HENT:    Head: Normocephalic and atraumatic.  Mouth/Throat: Oropharynx is clear and moist.  Eyes: Conjunctivae and EOM are normal. Pupils are equal, round, and reactive to light.  No TTP of maxillary or frontal sinuses  No TTP or induration of temporal arteries bilaterally  Neck: Normal range of motion. Neck supple.  FROM to C-spine. Pt can touch chin to chest without discomfort. No TTP of midline cervical spine.   Cardiovascular: Normal rate, regular rhythm and intact distal pulses.   Pulmonary/Chest: Effort normal and breath sounds normal. No respiratory distress. She has no wheezes. She has no rales. She exhibits no tenderness.  Abdominal: Soft. Bowel sounds are normal. There is no tenderness.  Musculoskeletal: Normal range of motion. She exhibits no edema or tenderness.  Neurological: She is alert and oriented to person, place, and time. No cranial nerve deficit.  II-Visual fields grossly intact. III/IV/VI-Extraocular movements intact.  Pupils reactive bilaterally. V/VII-Smile symmetric, equal eyebrow raise,  facial sensation intact VIII- Hearing grossly intact IX/X-Normal gag XI-bilateral shoulder shrug XII-midline tongue extension Motor: 5/5 bilaterally with normal tone and bulk Cerebellar: Normal finger-to-nose  and normal heel-to-shin test.   Romberg negative Ambulates with a coordinated gait   Nursing note and vitals reviewed.    ED Treatments / Results  Labs (all labs ordered are listed, but only abnormal results are displayed) Labs Reviewed  I-STAT CHEM 8, ED - Abnormal; Notable for the following:       Result Value   Chloride 99 (*)    All other components within normal limits  I-STAT BETA HCG BLOOD, ED ([facility], [facility], [facility] ONLY)    EKG  EKG Interpretation None       Radiology No results found.  Procedures Procedures (including critical care time)  Medications Ordered in ED Medications  magnesium sulfate IVPB 2 g 50 mL (2 g Intravenous New Bag/Given 07/11/16 1604)   prochlorperazine (COMPAZINE) injection 10 mg (10 mg Intravenous Given 07/11/16 1429)  methylPREDNISolone sodium succinate (SOLU-MEDROL) 125 mg/2 mL injection 125 mg (125 mg Intravenous Given 07/11/16 1429)  diphenhydrAMINE (BENADRYL) injection 25 mg (25 mg Intravenous Given 07/11/16 1429)  sodium chloride 0.9 % bolus 1,000 mL (0 mLs Intravenous Stopped 07/11/16 1603)  ketorolac (TORADOL) 30 MG/ML injection 30 mg (30 mg Intravenous Given 07/11/16 1504)  haloperidol lactate (HALDOL) injection 2.5 mg (2.5 mg Intravenous Given 07/11/16 1604)     Initial Impression / Assessment and Plan / ED Course  I have reviewed the triage vital signs and the nursing notes.  Pertinent labs & imaging results that were available during my care of the patient were reviewed by me and considered in my medical decision making (see chart for details).  Clinical Course    Vitals:   07/11/16 1115 07/11/16 1304  BP: 142/83 136/71  Pulse: 84 73  Resp: 17 16  Temp: 98.7 F (37.1 C) 98.3 F (36.8 C)  TempSrc: Oral Oral  SpO2: 96% 99%  Weight: 99.3 kg   Height: 5' 9.5" (1.765 m)     Medications  prochlorperazine (COMPAZINE) injection 10 mg (not administered)  methylPREDNISolone sodium succinate (SOLU-MEDROL) 125 mg/2 mL injection 125 mg (not administered)  diphenhydrAMINE (BENADRYL) injection 25 mg (not administered)  sodium chloride 0.9 % bolus 1,000 mL (not administered)    Samantha [name] is 35 y.o. female presenting with HA.  Neuro exam is nonfocal, no red flags. Patient will be given headache cocktail and reassess, patient reports headache cocktail did not significantly improve her pain, we'll try Toradol.  After Toradol, patient states her pain is 8 out of 10, will obtain CT and end his magnesium and Haldol.  Head CT negative, patient states that her headache is 7 out of 10, I'm reassured by the tension-like characteristics of her headache and nonfocal neuro exam, patient feels comfortable going home. Advised her  to follow closely with her neurologist.  Evaluation does not show pathology that would require ongoing emergent intervention or inpatient treatment. Pt is hemodynamically stable and mentating appropriately. Discussed findings and plan with patient/guardian, who agrees with care plan. All questions answered. Return precautions discussed and outpatient follow up given.    Final Clinical Impressions(s) / ED Diagnoses   Final diagnoses:  Bad headache    New Prescriptions New Prescriptions   No medications on file     Monico Blitz, PA-C 07/11/16 [place], MD 07/12/16 1003

## 2016-07-11 NOTE — ED Notes (Signed)
Patient ambulated well to the bathroom without any difficulty or distress with family member in tow

## 2016-07-11 NOTE — ED Notes (Signed)
EDP at bedside  

## 2016-07-11 NOTE — ED Triage Notes (Signed)
Pt. Stated, I've had a headache now for 4 days . The doctor gave me Tramadol and has not touched it. At [place] saw a doctor and Im suppose to go back in 2 days and can not wait that long.

## 2016-07-11 NOTE — Discharge Instructions (Signed)
Please follow with your primary care doctor in the next 2 days for a check-up. They must obtain records for further management.  ° °Do not hesitate to return to the Emergency Department for any new, worsening or concerning symptoms.  ° °

## 2016-07-13 DIAGNOSIS — M791 Myalgia: Secondary | ICD-10-CM | POA: Diagnosis not present

## 2016-07-13 DIAGNOSIS — G43019 Migraine without aura, intractable, without status migrainosus: Secondary | ICD-10-CM | POA: Diagnosis not present

## 2016-07-19 DIAGNOSIS — F3132 Bipolar disorder, current episode depressed, moderate: Secondary | ICD-10-CM | POA: Diagnosis not present

## 2016-08-11 DIAGNOSIS — L237 Allergic contact dermatitis due to plants, except food: Secondary | ICD-10-CM | POA: Diagnosis not present

## 2016-08-11 DIAGNOSIS — M797 Fibromyalgia: Secondary | ICD-10-CM | POA: Insufficient documentation

## 2016-08-11 DIAGNOSIS — M65841 Other synovitis and tenosynovitis, right hand: Secondary | ICD-10-CM | POA: Diagnosis not present

## 2016-08-16 DIAGNOSIS — L0293 Carbuncle, unspecified: Secondary | ICD-10-CM | POA: Diagnosis not present

## 2016-08-16 DIAGNOSIS — H00033 Abscess of eyelid right eye, unspecified eyelid: Secondary | ICD-10-CM | POA: Diagnosis not present

## 2016-09-02 ENCOUNTER — Encounter (HOSPITAL_COMMUNITY): Payer: Self-pay | Admitting: Nurse Practitioner

## 2016-09-02 ENCOUNTER — Emergency Department (HOSPITAL_COMMUNITY): Payer: Medicare Other

## 2016-09-02 ENCOUNTER — Emergency Department (HOSPITAL_COMMUNITY)
Admission: EM | Admit: 2016-09-02 | Discharge: 2016-09-02 | Disposition: A | Discharge status: Home / Self Care | Payer: Medicare Other | Attending: Emergency Medicine | Admitting: Emergency Medicine

## 2016-09-02 DIAGNOSIS — R0789 Other chest pain: Secondary | ICD-10-CM | POA: Diagnosis not present

## 2016-09-02 DIAGNOSIS — R079 Chest pain, unspecified: Secondary | ICD-10-CM | POA: Diagnosis present

## 2016-09-02 LAB — CBC
HCT: 40.1 % (ref 36.0–46.0)
Hemoglobin: 12.7 g/dL (ref 12.0–15.0)
MCH: 26.7 pg (ref 26.0–34.0)
MCHC: 31.7 g/dL (ref 30.0–36.0)
MCV: 84.2 fL (ref 78.0–100.0)
Platelets: 173 10*3/uL (ref 150–400)
RBC: 4.76 MIL/uL (ref 3.87–5.11)
RDW: 13.2 % (ref 11.5–15.5)
WBC: 6.4 10*3/uL (ref 4.0–10.5)

## 2016-09-02 LAB — I-STAT TROPONIN, ED: Troponin i, poc: 0 ng/mL (ref 0.00–0.08)

## 2016-09-02 LAB — BASIC METABOLIC PANEL
Anion gap: 7 (ref 5–15)
BUN: 10 mg/dL (ref 6–20)
CO2: 26 mmol/L (ref 22–32)
Calcium: 9.1 mg/dL (ref 8.9–10.3)
Chloride: 104 mmol/L (ref 101–111)
Creatinine, Ser: 0.6 mg/dL (ref 0.44–1.00)
GFR calc Af Amer: >60 mL/min (ref >60)
GFR calc non Af Amer: >60 mL/min (ref >60)
Glucose, Bld: 91 mg/dL (ref 65–99)
Potassium: 3.9 mmol/L (ref 3.5–5.1)
Sodium: 137 mmol/L (ref 135–145)

## 2016-09-02 LAB — TROPONIN I: Troponin I: <0.03 ng/mL (ref <0.03)

## 2016-09-02 MED ORDER — PREDNISONE 20 MG PO TABS
60.0000 mg | ORAL_TABLET | ORAL | Status: AC
  Administered 2016-09-02: 60 mg via ORAL
  Filled 2016-09-02: qty 3

## 2016-09-02 MED ORDER — PREDNISONE 20 MG PO TABS: 40.0000 mg | ORAL_TABLET | Freq: Every day | ORAL | 0 refills | Status: DC

## 2016-09-02 MED ORDER — IBUPROFEN 600 MG PO TABS: 600.0000 mg | ORAL_TABLET | Freq: Three times a day (TID) | ORAL | 0 refills | Status: AC

## 2016-09-02 MED ORDER — KETOROLAC TROMETHAMINE 30 MG/ML IJ SOLN
30.0000 mg | Freq: Once | INTRAMUSCULAR | Status: AC
  Administered 2016-09-02: 30 mg via INTRAMUSCULAR
  Filled 2016-09-02: qty 1

## 2016-09-02 NOTE — ED Notes (Signed)
Patient arrived to room.

## 2016-09-02 NOTE — Discharge Instructions (Signed)
As discussed, your evaluation today has been largely reassuring.  But, it is important that you monitor your condition carefully, and do not hesitate to return to the ED if you develop new, or concerning changes in your condition. ? ?Otherwise, please follow-up with your physician for appropriate ongoing care. ? ?

## 2016-09-02 NOTE — ED Notes (Signed)
Patient transported to xray and then to room. Currently not in room.

## 2016-09-02 NOTE — ED Provider Notes (Signed)
[facility] DEPT Provider Note   CSN: [id number] Arrival date & time: 09/02/16  1132     History   Chief Complaint Chief Complaint  Patient presents with  . Chest Pain    HPI Samantha [name] is a 35 y.o. female.   Chest Pain   Pertinent negatives include no vomiting.    Patient presents with concern of chest pain. The pain is focally in the left upper chest, though there is radiation diffusely across the anterior thorax. Pain is sore, moderate aside from the central area, which is severe. No dyspnea, no syncopal, though there is mild lightheadedness. No cough, no fever, chills. Onset was some time ago, unclear, but symptoms become worse over the past 24 hours. No relief with OTC medication. Patient notes history of fibromyalgia, mitral valve prolapse.   Past Medical History:  Diagnosis Date  . Anemia   . Anxiety   . Depression    rx presc but not taken  . GERD (gastroesophageal reflux disease)    no meds  . Headache(784.0)    tension and with anxiety hx  . History of kidney stones   . MVP (mitral valve prolapse)    as a child  . MVP (mitral valve prolapse)    echo 10  . Pneumonia    hx  . Seizures ([place])    non epileptic events per epilepsy monitoring unit  . Seizures ([place])    last seizure July 2015- does not convulse but has a sleep effect  . Sinus infection    recent on antibiotic  . Sleep apnea    has cpap does not use    Patient Active Problem List   Diagnosis Date Noted  . S/P abdominal hysterectomy 08/15/2014    Past Surgical History:  Procedure Laterality Date  . ABDOMINAL HYSTERECTOMY N/A 08/15/2014   Procedure: HYSTERECTOMY ABDOMINAL WITH CYSTOSCOPY;  Surgeon: Sanjuana Kava, MD;  Location: [facility];  Service: Gynecology;  Laterality: N/A;  . CESAREAN SECTION N/A   . SHOULDER ARTHROSCOPY WITH SUBACROMIAL DECOMPRESSION AND OPEN ROTATOR C Left 11/27/2015   Procedure: LEFT SHOULDER ARTHROSCOPY WITH SUBACROMIAL DECOMPRESSION, MINI OPEN ROTATOR CUFF  REPAIR;  Surgeon: Netta Cedars, MD;  Location: [facility];  Service: Orthopedics;  Laterality: Left;  . TUBAL LIGATION      OB History    No data available       Home Medications    Prior to Admission medications   Medication Sig Start Date End Date Taking? Authorizing Provider  acetaminophen (TYLENOL) 500 MG tablet Take 1,000 mg by mouth every 6 (six) hours as needed for headache.     Historical Provider, MD  busPIRone (BUSPAR) 15 MG tablet Take 15 mg by mouth 3 (three) times daily. 01/26/16   Historical Provider, MD  HYDROcodone-acetaminophen (NORCO/VICODIN) 5-325 MG tablet Take 1 tablet by mouth every 6 (six) hours as needed for moderate pain.    Historical Provider, MD  ibuprofen (ADVIL,MOTRIN) 200 MG tablet Take 400 mg by mouth every 6 (six) hours as needed.    Historical Provider, MD  ibuprofen (ADVIL,MOTRIN) 800 MG tablet Take 1 tablet (800 mg total) by mouth every 8 (eight) hours as needed for mild pain or moderate pain. Patient not taking: Reported on 07/11/2016 02/13/15   Clayton Bibles, PA-C  methocarbamol (ROBAXIN) 500 MG tablet Take 2 tablets (1,000 mg total) by mouth 4 (four) times daily as needed (Pain). 07/11/16   Nicole Pisciotta, PA-C  oxyCODONE-acetaminophen (ROXICET) 5-325 MG tablet Take 1-2 tablets by mouth  every 4 (four) hours as needed for severe pain. Patient not taking: Reported on 07/11/2016 11/27/15   Netta Cedars, MD    Family History History reviewed. No pertinent family history.  Social History Social History  Substance Use Topics  . Smoking status: Never Smoker  . Smokeless tobacco: Never Used  . Alcohol use No     Allergies   Sulfamethoxazole-trimethoprim and Penicillins   Review of Systems Review of Systems  Constitutional:       Per HPI, otherwise negative  HENT:       Per HPI, otherwise negative  Respiratory:       Per HPI, otherwise negative  Cardiovascular: Positive for chest pain.       Per HPI, otherwise negative  Gastrointestinal: Negative for  vomiting.  Endocrine:       Negative aside from HPI  Genitourinary:       Neg aside from HPI   Musculoskeletal:       Per HPI, otherwise negative  Skin: Negative.   Neurological: Negative for syncope.     Physical Exam Updated Vital Signs BP 129/79   Pulse 82   Temp 98.5 F (36.9 C) (Oral)   Resp 18   LMP 08/05/2014   SpO2 99%   Physical Exam  Constitutional: She is oriented to person, place, and time. She appears well-developed and well-nourished. No distress.  HENT:  Head: Normocephalic and atraumatic.  Eyes: Conjunctivae and EOM are normal.  Cardiovascular: Normal rate and regular rhythm.   Pulmonary/Chest: Effort normal and breath sounds normal. No stridor. No respiratory distress.  Tendon palpation about the left upper chest, no guarding, no deformity  Abdominal: She exhibits no distension.  Musculoskeletal: She exhibits no edema.  Neurological: She is alert and oriented to person, place, and time. No cranial nerve deficit.  Skin: Skin is warm and dry.  Psychiatric: She has a normal mood and affect.  Nursing note and vitals reviewed.    ED Treatments / Results  Labs (all labs ordered are listed, but only abnormal results are displayed) Labs Reviewed  [place], ED    EKG  EKG Interpretation  Date/Time:  Friday September 02 2016 11:37:58 EDT Ventricular Rate:  76 PR Interval:  164 QRS Duration: 98 QT Interval:  402 QTC Calculation: 452 R Axis:   8 Text Interpretation:  Normal sinus rhythm T wave abnormality Abnormal ekg Confirmed by Carmin Muskrat  MD [phone]) on 09/02/2016 12:00:54 PM       Radiology Dg Chest 2 View  Result Date: 09/02/2016 CLINICAL DATA:  Arm pains.  Chest tightness. EXAM: CHEST  2 VIEW COMPARISON:  07/11/2009 . FINDINGS: Mediastinum hilar structures normal. Lungs are clear. Heart size normal. No acute bony abnormality . IMPRESSION: No acute cardiopulmonary disease. Electronically Signed   By:  Marcello Moores  Register   On: 09/02/2016 12:32    Procedures Procedures (including critical care time)  Medications Ordered in ED Medications  ketorolac (TORADOL) 30 MG/ML injection 30 mg (not administered)  predniSONE (DELTASONE) tablet 60 mg (not administered)     Initial Impression / Assessment and Plan / ED Course  I have reviewed the triage vital signs and the nursing notes.  Pertinent labs & imaging results that were available during my care of the patient were reviewed by me and considered in my medical decision making (see chart for details).  Clinical Course   Initial results were reassuring. With abnormal T waves, patient will have repeat troponin, will  she receives analgesia.  On repeat exam the patient is standing upright, in no distress. Discussed all findings, including 2 negative troponins with her. With no other ongoing compliance, stable vital signs, patient will follow-up as an outpatient  And female presents with ongoing left-sided chest pain. Here she is awake and alert, with soreness in the left chest, reproducible on exam. Studies reassuring, no evidence for ongoing coronary ischemia, pneumonia, pleural effusion or blood clot. Patient started on course of anti-inflammatories, will follow-up with primary care.     Carmin Muskrat, MD 09/02/16 1700

## 2016-09-02 NOTE — ED Triage Notes (Signed)
Pt presents with c/o CP. Onset of CP 2 days ago while doing housework that resolved. Today, pain returned. Describes as a midsternal tightness that radiates across her entire chest. She reports sob, nausea. Denies dizziness, lightheadedness. She has tried nothing at home for the pain. Pain is increased with inspiration. She is alert and breathing easily

## 2016-09-15 DIAGNOSIS — E119 Type 2 diabetes mellitus without complications: Secondary | ICD-10-CM | POA: Diagnosis not present

## 2016-09-15 DIAGNOSIS — M797 Fibromyalgia: Secondary | ICD-10-CM | POA: Diagnosis not present

## 2016-09-15 DIAGNOSIS — J069 Acute upper respiratory infection, unspecified: Secondary | ICD-10-CM | POA: Diagnosis not present

## 2016-09-15 DIAGNOSIS — Z23 Encounter for immunization: Secondary | ICD-10-CM | POA: Diagnosis not present

## 2016-09-15 DIAGNOSIS — B9789 Other viral agents as the cause of diseases classified elsewhere: Secondary | ICD-10-CM | POA: Diagnosis not present

## 2016-09-15 DIAGNOSIS — E669 Obesity, unspecified: Secondary | ICD-10-CM | POA: Diagnosis not present

## 2016-09-20 DIAGNOSIS — R05 Cough: Secondary | ICD-10-CM | POA: Diagnosis not present

## 2016-09-20 DIAGNOSIS — M940 Chondrocostal junction syndrome [Tietze]: Secondary | ICD-10-CM | POA: Diagnosis not present

## 2016-09-20 DIAGNOSIS — M546 Pain in thoracic spine: Secondary | ICD-10-CM | POA: Diagnosis not present

## 2016-09-20 DIAGNOSIS — R0782 Intercostal pain: Secondary | ICD-10-CM | POA: Diagnosis not present

## 2016-09-22 DIAGNOSIS — M25552 Pain in left hip: Secondary | ICD-10-CM | POA: Diagnosis not present

## 2016-09-22 DIAGNOSIS — M25551 Pain in right hip: Secondary | ICD-10-CM | POA: Diagnosis not present

## 2016-09-22 DIAGNOSIS — M545 Low back pain: Secondary | ICD-10-CM | POA: Diagnosis not present

## 2016-09-28 DIAGNOSIS — F319 Bipolar disorder, unspecified: Secondary | ICD-10-CM | POA: Insufficient documentation

## 2016-09-29 ENCOUNTER — Ambulatory Visit (INDEPENDENT_AMBULATORY_CARE_PROVIDER_SITE_OTHER): Payer: Medicare Other | Admitting: Family Medicine

## 2016-09-29 ENCOUNTER — Encounter: Payer: Self-pay | Admitting: Family Medicine

## 2016-09-29 VITALS — BP 118/78 | HR 92 | Ht 67.75 in | Wt 210.2 lb

## 2016-09-29 DIAGNOSIS — J3089 Other allergic rhinitis: Secondary | ICD-10-CM

## 2016-09-29 DIAGNOSIS — K219 Gastro-esophageal reflux disease without esophagitis: Secondary | ICD-10-CM | POA: Diagnosis not present

## 2016-09-29 DIAGNOSIS — E119 Type 2 diabetes mellitus without complications: Secondary | ICD-10-CM

## 2016-09-29 DIAGNOSIS — M797 Fibromyalgia: Secondary | ICD-10-CM | POA: Diagnosis not present

## 2016-09-29 DIAGNOSIS — E669 Obesity, unspecified: Secondary | ICD-10-CM

## 2016-09-29 LAB — POCT UA - MICROALBUMIN
Albumin/Creatinine Ratio, Urine, POC: <30
Creatinine, POC: 200 mg/dL
Microalbumin Ur, POC: 30 mg/L

## 2016-09-29 LAB — POCT GLYCOSYLATED HEMOGLOBIN (HGB A1C): Hemoglobin A1C: 6.7

## 2016-09-29 MED ORDER — GABAPENTIN 100 MG PO CAPS: 100.0000 mg | ORAL_CAPSULE | Freq: Two times a day (BID) | ORAL | 0 refills | Status: DC

## 2016-09-29 NOTE — Assessment & Plan Note (Addendum)
Patient has only been taking it q hs due to sleepiness in past when she tried to take 300mg  during day.  We will start at 100 mg and slowly titrate up to prevent s-e.   Please see AVS for detailed instructions that were given to the patient on exactly how to do this.

## 2016-09-29 NOTE — Patient Instructions (Addendum)
For 7 days  please take 100 mg of Neurontin at 9-9:30 AM as well as your nightly dose of 900mg .    Days 8-14, please take 100 mg of Neurontin at 7 AM and 1 PM as well as your 900 mg dose at night.  And patient advised to titrate up per our discussion from there.  Over the next 3 months I want you to work on your diet and exercise to bring the A1c down. If you cannot get it under 6.5 in follow-up in 3 months, we will start metformin for you.     Prediabetes Eating Plan Prediabetes--also called impaired glucose tolerance or impaired fasting glucose--is a condition that causes blood sugar (blood glucose) levels to be higher than normal. Following a healthy diet can help to keep prediabetes under control. It can also help to lower the risk of type 2 diabetes and heart disease, which are increased in people who have prediabetes. Along with regular exercise, a healthy diet:  Promotes weight loss.  Helps to control blood sugar levels.  Helps to improve the way that the body uses insulin. WHAT DO I NEED TO KNOW ABOUT THIS EATING PLAN?  Use the glycemic index (GI) to plan your meals. The index tells you how quickly a food will raise your blood sugar. Choose low-GI foods. These foods take a longer time to raise blood sugar.  Pay close attention to the amount of carbohydrates in the food that you eat. Carbohydrates increase blood sugar levels.  Keep track of how many calories you take in. Eating the right amount of calories will help you to achieve a healthy weight. Losing about 7 percent of your starting weight can help to prevent type 2 diabetes.  You may want to follow a Mediterranean diet. This diet includes a lot of vegetables, lean meats or fish, whole grains, fruits, and healthy oils and fats. WHAT FOODS CAN I EAT? Grains Whole grains, such as whole-wheat or whole-grain breads, crackers, cereals, and pasta. Unsweetened oatmeal. Bulgur. Barley. Quinoa. Brown rice. Corn or whole-wheat flour  tortillas or taco shells. Vegetables Lettuce. Spinach. Peas. Beets. Cauliflower. Cabbage. Broccoli. Carrots. Tomatoes. Squash. Eggplant. Herbs. Peppers. Onions. Cucumbers. Brussels sprouts. Fruits Berries. Bananas. Apples. Oranges. Grapes. Papaya. Mango. Pomegranate. Kiwi. Grapefruit. Cherries. Meats and Other Protein Sources Seafood. Lean meats, such as chicken and [place] or lean cuts of pork and beef. Tofu. Eggs. Nuts. Beans. Dairy Low-fat or fat-free dairy products, such as yogurt, cottage cheese, and cheese. Beverages Water. Tea. Coffee. Sugar-free or diet soda. Seltzer water. Milk. Milk alternatives, such as soy or almond milk. Condiments Mustard. Relish. Low-fat, low-sugar ketchup. Low-fat, low-sugar barbecue sauce. Low-fat or fat-free mayonnaise. Sweets and Desserts Sugar-free or low-fat pudding. Sugar-free or low-fat ice cream and other frozen treats. Fats and Oils Avocado. Walnuts. Olive oil. The items listed above may not be a complete list of recommended foods or beverages. Contact your dietitian for more options.  WHAT FOODS ARE NOT RECOMMENDED? Grains Refined white flour and flour products, such as bread, pasta, snack foods, and cereals. Beverages Sweetened drinks, such as sweet iced tea and soda. Sweets and Desserts Baked goods, such as cake, cupcakes, pastries, cookies, and cheesecake. The items listed above may not be a complete list of foods and beverages to avoid. Contact your dietitian for more information.   This information is not intended to replace advice given to you by your health care provider. Make sure you discuss any questions you have with your health care provider.  Document Released: 04/07/2015 Document Reviewed: 04/07/2015 Elsevier Interactive Patient Education 2016 [place] factors for prediabetes and type 2 diabetes  Researchers don't fully understand why some people develop prediabetes and type 2 diabetes and others don't.   It's clear that certain factors increase the risk, however, including:  Weight. The more fatty tissue you have, the more resistant your cells become to insulin.  Inactivity. The less active you are, the greater your risk. Physical activity helps you control your weight, uses up glucose as energy and makes your cells more sensitive to insulin.  Family history. Your risk increases if a parent or sibling has type 2 diabetes.  Race. Although it's unclear why, people of certain races - including blacks, Hispanics, American Indians and Asian-Americans - are at higher risk.  Age. Your risk increases as you get older. This may be because you tend to exercise less, lose muscle mass and gain weight as you age. But type 2 diabetes is also increasing dramatically among children, adolescents and younger adults.  Gestational diabetes. If you developed gestational diabetes when you were pregnant, your risk of developing prediabetes and type 2 diabetes later increases. If you gave birth to a baby weighing more than 9 pounds (4 kilograms), you're also at risk of type 2 diabetes.  Polycystic ovary syndrome. For women, having polycystic ovary syndrome - a common condition characterized by irregular menstrual periods, excess hair growth and obesity - increases the risk of diabetes.  High blood pressure. Having blood pressure over 140/90 millimeters of mercury (mm Hg) is linked to an increased risk of type 2 diabetes.  Abnormal cholesterol and triglyceride levels. If you have low levels of high-density lipoprotein (HDL), or "good," cholesterol, your risk of type 2 diabetes is higher. Triglycerides are another type of fat carried in the blood. People with high levels of triglycerides have an increased risk of type 2 diabetes. Your doctor can let you know what your cholesterol and triglyceride levels are.    A good guide to good carbs: The glycemic index ---If you have diabetes, or at risk for diabetes, you know all too  well that when you eat carbohydrates, your blood sugar goes up. The total amount of carbs you consume at a meal or in a snack mostly determines what your blood sugar will do. But the food itself also plays a role. A serving of white rice has almost the same effect as eating pure table sugar - a quick, high spike in blood sugar. A serving of lentils has a slower, smaller effect.  ---Picking good sources of carbs can help you control your blood sugar and your weight. Even if you don't have diabetes, eating healthier carbohydrate-rich foods can help ward off a host of chronic conditions, from heart disease to various cancers to, well, diabetes.  ---One way to choose foods is with the glycemic index (GI). This tool measures how much a food boosts blood sugar.  The glycemic index rates the effect of a specific amount of a food on blood sugar compared with the same amount of pure glucose. A food with a glycemic index of 28 boosts blood sugar only 28% as much as pure glucose. One with a GI of 95 acts like pure glucose.  High glycemic foods result in a quick spike in insulin and blood sugar (also known as blood glucose).  Low glycemic foods have a slower, smaller effect- these are healthier for you.   Using the glycemic  index Using the glycemic index is easy: choose foods in the low GI category instead of those in the high GI category (see below), and go easy on those in between. Low glycemic index (GI of 55 or less): Most fruits and vegetables, beans, minimally processed grains, pasta, low-fat dairy foods, and nuts.  Moderate glycemic index (GI 56 to 69): White and sweet potatoes, corn, white rice, couscous, breakfast cereals such as Cream of Wheat and Mini Wheats.  High glycemic index (GI of 70 or higher): White bread, rice cakes, most crackers, bagels, cakes, doughnuts, croissants, most packaged breakfast cereals. You can see the values for 100 commons foods and get links to more at  www.health.CheapToothpicks.si.  Swaps for lowering glycemic index  Instead of this high-glycemic index food Eat this lower-glycemic index food  White rice Brown rice or converted rice  Instant oatmeal Steel-cut oats  Cornflakes Bran flakes  Baked potato Pasta, bulgur  White bread Whole-grain bread  Corn Peas or leafy greens

## 2016-09-29 NOTE — Assessment & Plan Note (Addendum)
A1c 6.7 today.  Patient declines metformin, and we will give her 3 months to get back on track and her A1c to a goal of under 6.5.  -Extensive counseling done.  Advised to check fasting blood sugars especially after she eats really healthy and very poorly.   - Referral for diabetic eye exam placed. - Urine microalbumin done today was negative.

## 2016-09-29 NOTE — Assessment & Plan Note (Signed)
[facility]- Txed for anxiety.

## 2016-09-29 NOTE — Progress Notes (Signed)
New patient office visit note:  Impression and Recommendations:    1. Diet-controlled type 2 diabetes mellitus ([place])   2. Fibromyalgia   3. Environmental and seasonal allergies   4. Gastroesophageal reflux disease, esophagitis presence not specified   5. Obesity (BMI 30-39.9)    Bipolar 1 disorder - Anxiety [facility]- Txed for anxiety.   Fibromyalgia Patient has only been taking it q hs due to sleepiness in past when she tried to take 300mg  during day.  We will start at 100 mg and slowly titrate up to prevent s-e.   Please see AVS for detailed instructions that were given to the patient on exactly how to do this.   Diet-controlled type 2 diabetes mellitus (H[facility]) A1c 6.7 today. Extensive counseling done.  Advised to check fasting blood sugars especially after she eats really healthy and very poorly.   - Referral for diabetic eye exam placed. - Urine microalbumin done today was negative.  Obesity (BMI 30-39.9) Weight loss advised, counseling done.  Environmental and seasonal allergies Symptoms are stable  GERD (gastroesophageal reflux disease) Continue OTC medications when necessary.  Pt was in the office today for 40+ minutes, with over 50% time spent in face to face counseling of various medical concerns and in coordination of care Orders Placed This Encounter  Procedures  . Ambulatory referral to Ophthalmology  . POCT glycosylated hemoglobin (Hb A1C)  . POCT UA - Microalbumin    New Prescriptions   GABAPENTIN (NEURONTIN) 100 MG CAPSULE    Take 1 capsule (100 mg total) by mouth 2 (two) times daily.    Modified Medications   No medications on file    Discontinued Medications   GABAPENTIN (NEURONTIN) 300 MG CAPSULE    Take 600 mg by mouth at bedtime.   PREDNISONE (DELTASONE) 20 MG TABLET    Take 2 tablets (40 mg total) by mouth daily with breakfast. For the next four days    Return in about 4 weeks (around 10/27/2016) for Follow-up of increase in  Neurontin.  The patient was counseled, risk factors were discussed, anticipatory guidance given.  Gross side effects, risk and benefits, and alternatives of medications discussed with patient.  Patient is aware that all medications have potential side effects and we are unable to predict every side effect or drug-drug interaction that may occur.  Expresses verbal understanding and consents to current therapy plan and treatment regimen.  Please see AVS handed out to patient at the end of our visit for further patient instructions/ counseling done pertaining to today's office visit.    Note: This document was prepared using Dragon voice recognition software and may include unintentional dictation errors.  ----------------------------------------------------------------------------------------------------------------------    Subjective:    Chief Complaint  Patient presents with  . Establish Care    HPI: Samantha [name] is a pleasant 35 y.o. female who presents to [place] [facility] today to review their medical history with me and establish care.   I asked the patient to review their chronic problem list with me to ensure everything was updated and accurate.    Prior PCP--> Dr Jonni Sanger.   I see patient's husband, Will as well. They both struggled with obesity, poor diets and unhealthy lifestyles  Obesity:  Per patient, she did lose over 40 pounds in the past couple years by just cutting out corn fritters that she used to frequently get at a [employer].  No exercise.  Diet controlled  Diabetes:    A1c Was 6.7 in March 2016 and as well as in October 2016. Has been between 6-6.5 since.  Went down with her weight loss.   Patient does not check her blood sugars. Her husband, whom I also take care of, also has diabetes which makes it convenient.  He has not been exercising either.   She has never had a foot exam and has never gone for an eye evaluation to rule out diabetic  retinopathy.  Fibromyalgia:   The ninth 100 mg Neurontin just at night has not helped.    It was just started approximately 3-4 months ago per patient..   Patient experiences pain "all over her body ".   Patient Care Team    Relationship Specialty Notifications Start End  [name], DO PCP - General Family Medicine  09/29/16   Pedro Earls, MD Attending Physician Family Medicine  09/29/16      Wt Readings from Last 3 Encounters:  09/29/16 210 lb 3.2 oz (95.3 kg)  07/11/16 219 lb (99.3 kg)  11/27/15 218 lb 4.1 oz (99 kg)   BP Readings from Last 3 Encounters:  09/29/16 118/78  09/02/16 128/73  07/11/16 125/68   Pulse Readings from Last 3 Encounters:  09/29/16 92  09/02/16 80  07/11/16 65   BMI Readings from Last 3 Encounters:  09/29/16 32.20 kg/m  07/11/16 31.88 kg/m  11/27/15 32.23 kg/m   Lab Results  Component Value Date   HGBA1C 6.7 09/29/2016   HGBA1C (H) 08/26/2010    5.8 (NOTE)                                                                       According to the ADA Clinical Practice Recommendations for 2011, when HbA1c is used as a screening test:   >=6.5%   Diagnostic of Diabetes Mellitus           (if abnormal result  is confirmed)  5.7-6.4%   Increased risk of developing Diabetes Mellitus  References:Diagnosis and Classification of Diabetes Mellitus,Diabetes [id number] 1):S62-S69 and Standards of Medical Care in         Diabetes - 2011,Diabetes Care,2011,34  (Suppl 1):S11-S61.   HGBA1C  07/11/2009    5.2 (NOTE) The ADA recommends the following therapeutic goal for glycemic control related to Hgb A1c measurement: Goal of therapy: <6.5 Hgb A1c  Reference: American Diabetes Association: Clinical Practice Recommendations 2010, Diabetes Care, 2010, 33: (Suppl  1).    Patient Active Problem List   Diagnosis Date Noted  . Environmental and seasonal allergies 09/29/2016  . GERD (gastroesophageal reflux disease) 09/29/2016  . Bipolar 1 disorder -  Anxiety 09/28/2016  . Fibromyalgia 08/11/2016  . Diet-controlled type 2 diabetes mellitus ([place]) 12/29/2015  . OSA on CPAP 12/08/2014  . Obstructive sleep apnea syndrome, moderate 11/24/2014  . Fatty liver 09/05/2014  . S/P abdominal hysterectomy 08/15/2014  . Bladder dysfunction 04/30/2014  . Nephrolithiasis 03/04/2014  . Agenesis, corpus callosum ([place]) 02/17/2014  . GAD (generalized anxiety disorder) 02/17/2014  . History of blood transfusion 02/17/2014  . Obesity (BMI 30-39.9) 02/17/2014     Past Medical History:  Diagnosis Date  . Anemia   . Anxiety   . Depression    rx  presc but not taken  . Diabetes mellitus without complication ([place])   . GERD (gastroesophageal reflux disease)    no meds  . Headache(784.0)    tension and with anxiety hx  . History of kidney stones   . MVP (mitral valve prolapse)    as a child  . MVP (mitral valve prolapse)    echo 10  . Pneumonia    hx  . Seizures ([place])    non epileptic events per epilepsy monitoring unit  . Seizures ([place])    last seizure July 2015- does not convulse but has a sleep effect  . Sinus infection    recent on antibiotic  . Sleep apnea    has cpap does not use     Past Surgical History:  Procedure Laterality Date  . ABDOMINAL HYSTERECTOMY N/A 08/15/2014   Procedure: HYSTERECTOMY ABDOMINAL WITH CYSTOSCOPY;  Surgeon: Sanjuana Kava, MD;  Location: [facility];  Service: Gynecology;  Laterality: N/A;  . CESAREAN SECTION N/A   . SHOULDER ARTHROSCOPY WITH SUBACROMIAL DECOMPRESSION AND OPEN ROTATOR C Left 11/27/2015   Procedure: LEFT SHOULDER ARTHROSCOPY WITH SUBACROMIAL DECOMPRESSION, MINI OPEN ROTATOR CUFF REPAIR;  Surgeon: Netta Cedars, MD;  Location: [facility];  Service: Orthopedics;  Laterality: Left;  . TUBAL LIGATION       Family History  Problem Relation Age of Onset  . Diabetes Mother   . Hypertension Mother   . Healthy Father   . Cancer Maternal Grandfather     brain, lung, liver  . Heart attack Paternal  Grandfather      History  Drug Use No    History  Alcohol Use No    History  Smoking Status  . Never Smoker  Smokeless Tobacco  . Never Used    Patient's Medications  New Prescriptions   GABAPENTIN (NEURONTIN) 100 MG CAPSULE    Take 1 capsule (100 mg total) by mouth 2 (two) times daily.  Previous Medications   BUSPIRONE (BUSPAR) 15 MG TABLET    Take 15 mg by mouth 3 (three) times daily.   FLUTICASONE (FLONASE) 50 MCG/ACT NASAL SPRAY    Place 2 sprays into the nose daily.   GABAPENTIN (NEURONTIN) 300 MG CAPSULE    Take 900 mg by mouth at bedtime.  Modified Medications   No medications on file  Discontinued Medications   GABAPENTIN (NEURONTIN) 300 MG CAPSULE    Take 600 mg by mouth at bedtime.   PREDNISONE (DELTASONE) 20 MG TABLET    Take 2 tablets (40 mg total) by mouth daily with breakfast. For the next four days    Allergies: Sulfa antibiotics; Sulfamethoxazole-trimethoprim; Duloxetine hcl; and Penicillins  Review of Systems  Constitutional: Negative.  Negative for chills, diaphoresis, fever, malaise/fatigue and weight loss.  HENT: Negative.  Negative for congestion, sore throat and tinnitus.   Eyes: Negative.  Negative for blurred vision, double vision and photophobia.  Respiratory: Negative.  Negative for cough and wheezing.   Cardiovascular: Negative.  Negative for chest pain and palpitations.  Gastrointestinal: Negative.  Negative for blood in stool, diarrhea, nausea and vomiting.  Genitourinary: Negative.  Negative for dysuria, frequency and urgency.  Musculoskeletal: Positive for joint pain. Negative for myalgias.  Skin: Negative.  Negative for itching and rash.  Neurological: Negative.  Negative for dizziness, focal weakness, weakness and headaches.  Endo/Heme/Allergies: Negative.  Negative for environmental allergies and polydipsia. Does not bruise/bleed easily.  Psychiatric/Behavioral: Negative for depression and memory loss. The patient is nervous/anxious.  The patient does not have  insomnia.     Objective:   Blood pressure 118/78, pulse 92, height 5' 7.75" (1.721 m), weight 210 lb 3.2 oz (95.3 kg), last menstrual period 08/05/2014. Body mass index is 32.2 kg/m. General: Well Developed, well nourished, and in no acute distress.  Neuro: Alert and oriented x3, extra-ocular muscles intact, sensation grossly intact.  HEENT: Normocephalic, atraumatic, pupils equal round reactive to light, neck supple Skin: no gross suspicious lesions or rashes  Cardiac: Regular rate and rhythm, no murmurs rubs or gallops.  Respiratory: Essentially clear to auscultation bilaterally. Not using accessory muscles, speaking in full sentences.  Abdominal: Soft, not grossly distended Musculoskeletal: Ambulates w/o diff, FROM * 4 ext.  Vasc: less 2 sec cap RF, warm and pink  Psych:  No HI/SI, judgement and insight good, Euthymic mood. Full Affect.

## 2016-09-29 NOTE — Assessment & Plan Note (Signed)
Weight loss advised, counseling done.

## 2016-09-29 NOTE — Assessment & Plan Note (Signed)
Continue OTC medications when necessary.

## 2016-09-29 NOTE — Assessment & Plan Note (Signed)
Symptoms are stable

## 2016-10-20 ENCOUNTER — Telehealth: Payer: Self-pay

## 2016-10-20 DIAGNOSIS — M25532 Pain in left wrist: Secondary | ICD-10-CM | POA: Diagnosis not present

## 2016-10-20 DIAGNOSIS — S46312A Strain of muscle, fascia and tendon of triceps, left arm, initial encounter: Secondary | ICD-10-CM | POA: Diagnosis not present

## 2016-10-20 NOTE — Telephone Encounter (Signed)
Pt c/o left elbow pain, can't lift her arm above her head, can't carry anything on this side of her body, and possibly slight swelling.  Pt denies redness or feeling hot to touch.  Advised pt that she should be seen at [facility] because she may need xrays and we are unable to do those in our office at this time.  Pt expressed understanding and is agreeable.  Charyl Bigger, CMA

## 2016-11-04 ENCOUNTER — Ambulatory Visit: Payer: Medicare Other | Admitting: Family Medicine

## 2016-11-08 ENCOUNTER — Ambulatory Visit (INDEPENDENT_AMBULATORY_CARE_PROVIDER_SITE_OTHER): Payer: Medicare Other | Admitting: Family Medicine

## 2016-11-08 ENCOUNTER — Encounter: Payer: Self-pay | Admitting: Family Medicine

## 2016-11-08 VITALS — BP 117/78 | HR 81 | Ht 67.75 in | Wt 212.4 lb

## 2016-11-08 DIAGNOSIS — M797 Fibromyalgia: Secondary | ICD-10-CM | POA: Diagnosis not present

## 2016-11-08 DIAGNOSIS — E119 Type 2 diabetes mellitus without complications: Secondary | ICD-10-CM

## 2016-11-08 DIAGNOSIS — E669 Obesity, unspecified: Secondary | ICD-10-CM

## 2016-11-08 MED ORDER — GABAPENTIN 300 MG PO CAPS: ORAL_CAPSULE | ORAL | 1 refills | Status: DC

## 2016-11-08 NOTE — Patient Instructions (Addendum)
Use up your existing 100 mg of Neurontin and take 200 mg twice during the day and 900 at night for a week, then slowly increase that to getting to 900 mg 3 times daily as tolerated..   -  PLEASE check your FBS's - can't change something that you don't acknowledge. So by not checking it you don't know if you're doing well or not. Checking it will help keep you focused in on track with her diet and your exercise plan in order to get the A1c under 6.5 next time we check it in January.   Guidelines for Losing Weight   We want weight loss that will last so you should lose 1-2 pounds a week.  THAT IS IT! Please pick THREE things a month to change. Once it is a habit check off the item. Then pick another three items off the list to become habits.  If you are already doing a habit on the list GREAT!  Cross that item off!  Don't drink your calories. Ie, alcohol, soda, fruit juice, and sweet tea.   Drink more water. Drink a glass when you feel hungry or before each meal.   Eat breakfast - Complex carb and protein (likeDannon light and fit yogurt, oatmeal, fruit, eggs, [place] bacon).  Measure your cereal.  Eat no more than one cup a day. (ie Kashi)  Eat an apple a day.  Add a vegetable a day.  Try a new vegetable a month.  Use Pam! Stop using oil or butter to cook.  Don't finish your plate or use smaller plates.  Share your dessert.  Eat sugar free Jello for dessert or frozen grapes.  Don't eat 2-3 hours before bed.  Switch to whole wheat bread, pasta, and brown rice.  Make healthier choices when you eat out. No fries!  Pick baked chicken, NOT fried.  Don't forget to SLOW DOWN when you eat. It is not going anywhere.   Take the stairs.  Park far away in the parking lot  Lift soup cans (or weights) for 10 minutes while watching TV.  Walk at work for 10 minutes during break.  Walk outside 1 time a week with your friend, kids, dog, or significant other.  Start a walking group at  church.  Walk the mall as much as you can tolerate.   Keep a food diary.  Weigh yourself daily.  Walk for 15 minutes 3 days per week.  Cook at home more often and eat out less. If life happens and you go back to old habits, it is okay.  Just start over. You can do it!  If you experience chest pain, get short of breath, or tired during the exercise, please stop immediately and inform your doctor.    Before you even begin to attack a weight-loss plan, it pays to remember this: You are not fat. You have fat. Losing weight isn't about blame or shame; it's simply another achievement to accomplish. Dieting is like any other skill-you have to buckle down and work at it. As long as you act in a smart, reasonable way, you'll ultimately get where you want to be. Here are some weight loss pearls for you.   1. It's Not a Diet. It's a Lifestyle Thinking of a diet as something you're on and suffering through only for the short term doesn't work. To shed weight and keep it off, you need to make permanent changes to the way you eat. It's OK to indulge occasionally,  of course, but if you cut calories temporarily and then revert to your old way of eating, you'll gain back the weight quicker than you can say yo-yo. Use it to lose it. Research shows that one of the best predictors of long-term weight loss is how many pounds you drop in the first month. For that reason, nutritionists often suggest being stricter for the first two weeks of your new eating strategy to build momentum. Cut out added sugar and alcohol and avoid unrefined carbs. After that, figure out how you can reincorporate them in a way that's healthy and maintainable.  2. There's a Right Way to Exercise Working out burns calories and fat and boosts your metabolism by building muscle. But those trying to lose weight are notorious for overestimating the number of calories they burn and underestimating the amount they take in. Unfortunately, your  system is biologically programmed to hold on to extra pounds and that means when you start exercising, your body senses the deficit and ramps up its hunger signals. If you're not diligent, you'll eat everything you burn and then some. Use it, to lose it. Cardio gets all the exercise glory, but strength and interval training are the real heroes. They help you build lean muscle, which in turn increases your metabolism and calorie-burning ability 3. Don't Overreact to Mild Hunger Some people have a hard time losing weight because of hunger anxiety. To them, being hungry is bad-something to be avoided at all costs-so they carry snacks with them and eat when they don't need to. Others eat because they're stressed out or bored. While you never want to get to the point of being ravenous (that's when bingeing is likely to happen), a hunger pang, a craving, or the fact that it's 3:00 p.m. should not send you racing for the vending machine or obsessing about the energy bar in your purse. Ideally, you should put off eating until your stomach is growling and it's difficult to concentrate.  Use it to lose it. When you feel the urge to eat, use the HALT method. Ask yourself, Am I really hungry? Or am I angry or anxious, lonely or bored, or tired? If you're still not certain, try the apple test. If you're truly hungry, an apple should seem delicious; if it doesn't, something else is going on. Or you can try drinking water and making yourself busy, if you are still hungry try a healthy snack.  4. Not All Calories Are Created Equal The mechanics of weight loss are pretty simple: Take in fewer calories than you use for energy. But the kind of food you eat makes all the difference. Processed food that's high in saturated fat and refined starch or sugar can cause inflammation that disrupts the hormone signals that tell your brain you're full. The result: You eat a lot more.  Use it to lose it. Clean up your diet. Swap in whole,  unprocessed foods, including vegetables, lean protein, and healthy fats that will fill you up and give you the biggest nutritional bang for your calorie buck. In a few weeks, as your brain starts receiving regular hunger and fullness signals once again, you'll notice that you feel less hungry overall and naturally start cutting back on the amount you eat.  5. Protein, Produce, and Plant-Based Fats Are Your Weight-Loss Trinity Here's why eating the three Ps regularly will help you drop pounds. Protein fills you up. You need it to build lean muscle, which keeps your metabolism humming so  that you can torch more fat. People in a weight-loss program who ate double the recommended daily allowance for protein (about 110 grams for a 150-pound woman) lost 70 percent of their weight from fat, while people who ate the RDA lost only about 40 percent, one study found. Produce is packed with filling fiber. "It's very difficult to consume too many calories if you're eating a lot of vegetables. Example: Three cups of broccoli is a lot of food, yet only 93 calories. (Fruit is another story. It can be easy to overeat and can contain a lot of calories from sugar, so be sure to monitor your intake.) Plant-based fats like olive oil and those in avocados and nuts are healthy and extra satiating.  Use it to lose it. Aim to incorporate each of the three Ps into every meal and snack. People who eat protein throughout the day are able to keep weight off, according to a study in the [place] of Clinical Nutrition. In addition to meat, poultry and seafood, good sources are beans, lentils, eggs, tofu, and yogurt. As for fat, keep portion sizes in check by measuring out salad dressing, oil, and nut butters (shoot for one to two tablespoons). Finally, eat veggies or a little fruit at every meal. People who did that consumed 308 fewer calories but didn't feel any hungrier than when they didn't eat more produce.  7. How You Eat Is  As Important As What You Eat In order for your brain to register that you're full, you need to focus on what you're eating. Sit down whenever you eat, preferably at a table. Turn off the TV or computer, put down your phone, and look at your food. Smell it. Chew slowly, and don't put another bite on your fork until you swallow. When women ate lunch this attentively, they consumed 30 percent less when snacking later than those who listened to an audiobook at lunchtime, according to a study in the [place] of Nutrition. 8. Weighing Yourself Really Works The scale provides the best evidence about whether your efforts are paying off. Seeing the numbers tick up or down or stagnate is motivation to keep going-or to rethink your approach. A 2015 study at [facility] found that daily weigh-ins helped people lose more weight, keep it off, and maintain that loss, even after two years. Use it to lose it. Step on the scale at the same time every day for the best results. If your weight shoots up several pounds from one weigh-in to the next, don't freak out. Eating a lot of salt the night before or having your period is the likely culprit. The number should return to normal in a day or two. It's a steady climb that you need to do something about. 9. Too Much Stress and Too Little Sleep Are Your Enemies When you're tired and frazzled, your body cranks up the production of cortisol, the stress hormone that can cause carb cravings. Not getting enough sleep also boosts your levels of ghrelin, a hormone associated with hunger, while suppressing leptin, a hormone that signals fullness and satiety. People on a diet who slept only five and a half hours a night for two weeks lost 55 percent less fat and were hungrier than those who slept eight and a half hours, according to a study in the [place]. Use it to lose it. Prioritize sleep, aiming for seven hours or more a night, which research  shows helps lower stress. And  make sure you're getting quality zzz's. If a snoring spouse or a fidgety cat wakes you up frequently throughout the night, you may end up getting the equivalent of just four hours of sleep, according to a study from [facility]. Keep pets out of the bedroom, and use a white-noise app to drown out snoring. 10. You Will Hit a plateau-And You Can Bust Through It As you slim down, your body releases much less leptin, the fullness hormone.  If you're not strength training, start right now. Building muscle can raise your metabolism to help you overcome a plateau. To keep your body challenged and burning calories, incorporate new moves and more intense intervals into your workouts or add another sweat session to your weekly routine. Alternatively, cut an extra 100 calories or so a day from your diet. Now that you've lost weight, your body simply doesn't need as much fuel.      Since food equals calories, in order to lose weight you must either eat fewer calories, exercise more to burn off calories with activity, or both. Food that is not used to fuel the body is stored as fat. A major component of losing weight is to make smarter food choices. Here's how:  1)   Limit non-nutritious foods, such as: Sugar, honey, syrups and candy Pastries, donuts, pies, cakes and cookies Soft drinks, sweetened juices and alcoholic beverages  2)  Cut down on high-fat foods by: - Choosing poultry, fish or lean red meat - Choosing low-fat cooking methods, such as baking, broiling, steaming, grilling and boiling - Using low-fat or non-fat dairy products - Using vinaigrette, herbs, lemon or fat-free salad dressings - Avoiding fatty meats, such as bacon, sausage, franks, ribs and luncheon meats - Avoiding high-fat snacks like nuts, chips and chocolate - Avoiding fried foods - Using less butter, margarine, oil and mayonnaise - Avoiding high-fat gravies, cream sauces and cream-based  soups  3) Eat a variety of foods, including: - Fruit and vegetables that are raw, steamed or baked - Whole grains, breads, cereal, rice and pasta - Dairy products, such as low-fat or non-fat milk or yogurt, low-fat cottage cheese and low-fat cheese - Protein-rich foods like chicken, [place], fish, lean meat and legumes, or beans  4) Change your eating habits by: - Eat three balanced meals a day to help control your hunger - Watch portion sizes and eat small servings of a variety of foods - Choose low-calorie snacks - Eat only when you are hungry and stop when you are satisfied - Eat slowly and try not to perform other tasks while eating - Find other activities to distract you from food, such as walking, taking up a hobby or being involved in the community - Include regular exercise in your daily routine ( minimum of 20 min of moderate-intensity exercise at least 5 days/week)  - Find a support group, if necessary, for emotional support in your weight loss journey      Easy ways to cut 100 calories  1. Eat your eggs with hot sauce OR salsa instead of cheese.  Eggs are great for breakfast, but many people consider eggs and cheese to be BFFs. Instead of cheese-1 oz. of cheddar has 114 calories-top your eggs with hot sauce, which contains no calories and helps with satiety and metabolism. Salsa is also a great option!!  2. Top your toast, waffles or pancakes with fresh berries instead of jelly or syrup. Half a cup of berries-fresh, frozen or thawed-has about 40  calories, compared with 2 tbsp. of maple syrup or jelly, which both have about 100 calories. The berries will also give you a good punch of fiber, which helps keep you full and satisfied and won't spike blood sugar quickly like the jelly or syrup. 3. Swap the non-fat latte for black coffee with a splash of half-and-half. Contrary to its name, that non-fat latte has 130 calories and a startling 19g of carbohydrates per 16 oz. serving.  Replacing that 'light' drinkable dessert with a black coffee with a splash of half-and-half saves you more than 100 calories per 16 oz. serving. 4. Sprinkle salads with freeze-dried raspberries instead of dried cranberries. If you want a sweet addition to your nutritious salad, stay away from dried cranberries. They have a whopping 130 calories per  cup and 30g carbohydrates. Instead, sprinkle freeze-dried raspberries guilt-free and save more than 100 calories per  cup serving, adding 3g of belly-filling fiber. 5. Go for mustard in place of mayo on your sandwich. Mustard can add really nice flavor to any sandwich, and there are tons of varieties, from spicy to honey. A serving of mayo is 95 calories, versus 10 calories in a serving of mustard.  Or try an avocado mayo spread: You can find the recipe few click this link: https://www.californiaavocado.com/recipes/recipe-container/[place]-avocado-mayo 6. Choose a DIY salad dressing instead of the store-bought kind. Mix Dijon or whole grain mustard with low-fat Kefir or red wine vinegar and garlic. 7. Use hummus as a spread instead of a dip. Use hummus as a spread on a high-fiber cracker or tortilla with a sandwich and save on calories without sacrificing taste. 8. Pick just one salad "accessory." Salad isn't automatically a calorie winner. It's easy to over-accessorize with toppings. Instead of topping your salad with nuts, avocado and cranberries (all three will clock in at 313 calories), just pick one. The next day, choose a different accessory, which will also keep your salad interesting. You don't wear all your jewelry every day, right? 9. Ditch the white pasta in favor of spaghetti squash. One cup of cooked spaghetti squash has about 40 calories, compared with traditional spaghetti, which comes with more than 200. Spaghetti squash is also nutrient-dense. It's a good source of fiber and Vitamins A and C, and it can be eaten just like you would eat  pasta-with a great tomato sauce and [place] meatballs or with pesto, tofu and spinach, for example. 10. Dress up your chili, soups and stews with non-fat [place] yogurt instead of sour cream. Just a 'dollop' of sour cream can set you back 115 calories and a whopping 12g of fat-seven of which are of the artery-clogging variety. Added bonus: [place] yogurt is packed with muscle-building protein, calcium and B Vitamins. 11. Mash cauliflower instead of mashed potatoes. One cup of traditional mashed potatoes-in all their creamy goodness-has more than 200 calories, compared to mashed cauliflower, which you can typically eat for less than 100 calories per 1 cup serving. Cauliflower is a great source of the antioxidant indole-3-carbinol (I3C), which may help reduce the risk of some cancers, like breast cancer. 12. Ditch the ice cream sundae in favor of a [place] yogurt parfait. Instead of a cup of ice cream or fro-yo for dessert, try 1 cup of nonfat Greek yogurt topped with fresh berries and a sprinkle of cacao nibs. Both toppings are packed with antioxidants, which can help reduce cellular inflammation and oxidative damage. And the comparison is a no-brainer: One cup of ice cream has about 275 calories;  one cup of frozen yogurt has about 230; and a cup of Greek yogurt has just 130, plus twice the protein, so you're less likely to return to the freezer for a second helping. 13. Put olive oil in a spray container instead of using it directly from the bottle. Each tablespoon of olive oil is 120 calories and 15g of fat. Use a mister instead of pouring it straight into the pan or onto a salad. This allows for portion control and will save you more than 100 calories. 14. When baking, substitute canned pumpkin for butter or oil. Canned pumpkin-not pumpkin pie mix-is loaded with Vitamin A, which is important for skin and eye health, as well as immunity. And the comparisons are pretty crazy:  cup of canned pumpkin has about 40  calories, compared to butter or oil, which has more than 800 calories. Yes, 800 calories. Applesauce and mashed banana can also serve as good substitutions for butter or oil, usually in a 1:1 ratio. 15. Top casseroles with high-fiber cereal instead of breadcrumbs. Breadcrumbs are typically made with white bread, while breakfast cereals contain 5-9g of fiber per serving. Not only will you save more than 150 calories per  cup serving, the swap will also keep you more full and you'll get a metabolism boost from the added fiber. 16. Snack on pistachios instead of macadamia nuts. Believe it or not, you get the same amount of calories from 35 pistachios (100 calories) as you would from only five macadamia nuts. 17. Chow down on kale chips rather than potato chips. This is my favorite 'don't knock it 'till you try it' swap. Kale chips are so easy to make at home, and you can spice them up with a little grated parmesan or chili powder. Plus, they're a mere fraction of the calories of potato chips, but with the same crunch factor we crave so often. 18. Add seltzer and some fruit slices to your cocktail instead of soda or fruit juice. One cup of soda or fruit juice can pack on as much as 140 calories. Instead, use seltzer and fruit slices. The fruit provides valuable phytochemicals, such as flavonoids and anthocyanins, which help to combat cancer and stave off the aging process.

## 2016-11-08 NOTE — Progress Notes (Signed)
Impression and Recommendations:    1. Obesity (BMI 30-39.9)   2. Fibromyalgia   3. Diet-controlled type 2 diabetes mellitus (HCC)     Obesity (BMI 30-39.9) Weight loss advised, counseling done.  Fibromyalgia - Medication is well tolerated this time around and patient would like to increase dose. She thinks she can go to full strength around the clock. Again stressed importance of slowly tapering med up  Last office visit:  Patient has only been taking it q hs due to sleepiness in past when she tried to take 300mg  during day.  We will start at 100 mg and slowly titrate up to prevent s-e.   Please see AVS for detailed instructions that were given to the patient on exactly how to do this.   Diet-controlled type 2 diabetes mellitus ([place])  -  PLEASE check your FBS's - can't change something that you don't acknowledge. So by not checking it you don't know if you're doing well or not. Checking it will help keep you focused in on track with her diet and your exercise plan in order to get the A1c under 6.5 next time we check it in January.   New Prescriptions   No medications on file    Modified Medications   Modified Medication Previous Medication   GABAPENTIN (NEURONTIN) 300 MG CAPSULE gabapentin (NEURONTIN) 300 MG capsule      900mg  TID    Take 900 mg by mouth at bedtime.    Discontinued Medications   BUSPIRONE (BUSPAR) 15 MG TABLET    Take 15 mg by mouth 3 (three) times daily.   GABAPENTIN (NEURONTIN) 100 MG CAPSULE    Take 1 capsule (100 mg total) by mouth 2 (two) times daily.    The patient was counseled, risk factors were discussed, anticipatory guidance given.  Gross side effects, risk and benefits, and alternatives of medications and treatment plan in general discussed with patient.  Patient is aware that all medications have potential side effects and we are unable to predict every side effect or drug-drug interaction that may occur.   Patient will call with any  questions prior to using medication if they have concerns.  Expresses verbal understanding and consents to current therapy and treatment regimen.  No barriers to understanding were identified.  Red flag symptoms and signs discussed in detail.  Patient expressed understanding regarding what to do in case of emergency\urgent symptoms  Return for before jan 18th- f/up for inc in neurontin and ALSO obtain a1c.  Please see AVS handed out to patient at the end of our visit for further patient instructions/ counseling done pertaining to today's office visit.    Note: This document was prepared using Dragon voice recognition software and may include unintentional dictation errors.   --------------------------------------------------------------------------------------------------------------------------------------------------------------------------------------------------------------------------------------------    Subjective:    CC:  Chief Complaint  Patient presents with  . Fibromyalgia    HPI: Samantha [name] is a 35 y.o. female who presents to [facility] today for issues as discussed below.   FM- Last OV went up on neurontin at nite and  And added daytime doses to help with her FM.  NO S-E.   Taking 100, 100, 900.    THinks it is helping-  Can get up a t night and go the bathroom w/o being in agonizing pain just ot get to bathroom and back.   Diet controlled DM-  Last checked a1c was  6.7 in  OCt.  Pt not chekcing at home her sugars but plans to.        Lab Results  Component Value Date   HGBA1C 6.7 09/29/2016   HGBA1C (H) 08/26/2010    5.8 (NOTE)                                                                       According to the ADA Clinical Practice Recommendations for 2011, when HbA1c is used as a screening test:   >=6.5%   Diagnostic of Diabetes Mellitus           (if abnormal result  is confirmed)  5.7-6.4%   Increased risk of developing  Diabetes Mellitus  References:Diagnosis and Classification of Diabetes Mellitus,Diabetes [id number] 1):S62-S69 and Standards of Medical Care in         Diabetes - 2011,Diabetes [id number]  (Suppl 1):S11-S61.   HGBA1C  07/11/2009    5.2 (NOTE) The ADA recommends the following therapeutic goal for glycemic control related to Hgb A1c measurement: Goal of therapy: <6.5 Hgb A1c  Reference: American Diabetes Association: Clinical Practice Recommendations 2010, Diabetes Care, 2010, 33: (Suppl  1).     Wt Readings from Last 3 Encounters:  11/08/16 212 lb 6.4 oz (96.3 kg)  09/29/16 210 lb 3.2 oz (95.3 kg)  07/11/16 219 lb (99.3 kg)   BP Readings from Last 3 Encounters:  11/08/16 117/78  09/29/16 118/78  09/02/16 128/73   Pulse Readings from Last 3 Encounters:  11/08/16 81  09/29/16 92  09/02/16 80   BMI Readings from Last 3 Encounters:  11/08/16 32.53 kg/m  09/29/16 32.20 kg/m  07/11/16 31.88 kg/m     Patient Care Team    Relationship Specialty Notifications Start End  [name], DO PCP - General Family Medicine  09/29/16   Pedro Earls, MD Attending Physician Family Medicine  09/29/16     Patient Active Problem List   Diagnosis Date Noted  . Environmental and seasonal allergies 09/29/2016  . GERD (gastroesophageal reflux disease) 09/29/2016  . Bipolar 1 disorder - Anxiety 09/28/2016  . Fibromyalgia 08/11/2016  . Diet-controlled type 2 diabetes mellitus ([address]) 12/29/2015  . OSA on CPAP 12/08/2014  . Obstructive sleep apnea syndrome, moderate 11/24/2014  . Fatty liver 09/05/2014  . S/P abdominal hysterectomy 08/15/2014  . Bladder dysfunction 04/30/2014  . Nephrolithiasis 03/04/2014  . Agenesis, corpus callosum ([place]) 02/17/2014  . GAD (generalized anxiety disorder) 02/17/2014  . History of blood transfusion 02/17/2014  . Obesity (BMI 30-39.9) 02/17/2014    Past Medical history, Surgical history, Family history, Social history, Allergies and Medications  have been entered into the medical record, reviewed and changed as needed.   Allergies:  Allergies  Allergen Reactions  . Sulfa Antibiotics Anaphylaxis and Swelling  . Sulfamethoxazole-Trimethoprim Anaphylaxis    Tongue swelling also  . Duloxetine Hcl Other (See Comments)    Weight gain, feels "crazy"  . Penicillins Rash    Has patient had a PCN reaction causing immediate rash, facial/tongue/throat swelling, SOB or lightheadedness with hypotension: Yes Has patient had a PCN reaction causing severe rash involving mucus membranes or skin necrosis: No Has patient had a PCN reaction that required hospitalization No Has patient had a PCN reaction occurring within the last  10 years: No If all of the above answers are "NO", then may proceed with Cephalosporin use.     Review of Systems  Constitutional: Negative.  Negative for chills, diaphoresis, fever, malaise/fatigue and weight loss.  HENT: Negative.  Negative for congestion, sore throat and tinnitus.   Eyes: Negative.  Negative for blurred vision, double vision and photophobia.  Respiratory: Negative.  Negative for cough and wheezing.   Cardiovascular: Negative.  Negative for chest pain and palpitations.  Gastrointestinal: Negative.  Negative for blood in stool, diarrhea, nausea and vomiting.  Genitourinary: Negative.  Negative for dysuria, frequency and urgency.  Musculoskeletal: Positive for back pain, joint pain and myalgias.  Skin: Negative.  Negative for itching and rash.  Neurological: Negative.  Negative for dizziness, focal weakness, weakness and headaches.  Endo/Heme/Allergies: Negative.  Negative for environmental allergies and polydipsia. Does not bruise/bleed easily.  Psychiatric/Behavioral: Negative.  Negative for depression and memory loss. The patient is not nervous/anxious and does not have insomnia.      Objective:   Blood pressure 117/78, pulse 81, height 5' 7.75" (1.721 m), weight 212 lb 6.4 oz (96.3 kg), last  menstrual period 08/05/2014. Body mass index is 32.53 kg/m. General: Well Developed, well nourished, appropriate for stated age.  Neuro: Alert and oriented x3, extra-ocular muscles intact, sensation grossly intact.  HEENT: Normocephalic, atraumatic, neck supple   Skin: Warm and dry, no gross rash. Cardiac: RRR, S1 S2,  no murmurs rubs or gallops.  Respiratory: ECTA B/L, Not using accessory muscles, speaking in full sentences-unlabored. Vascular:  No gross lower ext edema, cap RF less 2 sec. Psych: No HI/SI, judgement and insight good, Euthymic mood. Full Affect.

## 2016-11-08 NOTE — Assessment & Plan Note (Signed)
-   Medication is well tolerated this time around and patient would like to increase dose. She thinks she can go to full strength around the clock. Again stressed importance of slowly tapering med up  Last office visit:  Patient has only been taking it q hs due to sleepiness in past when she tried to take 300mg  during day.  We will start at 100 mg and slowly titrate up to prevent s-e.   Please see AVS for detailed instructions that were given to the patient on exactly how to do this.

## 2016-11-08 NOTE — Assessment & Plan Note (Signed)
-    PLEASE check your FBS's - can't change something that you don't acknowledge. So by not checking it you don't know if you're doing well or not. Checking it will help keep you focused in on track with her diet and your exercise plan in order to get the A1c under 6.5 next time we check it in January.

## 2016-11-08 NOTE — Assessment & Plan Note (Signed)
Weight loss advised, counseling done.

## 2016-12-08 ENCOUNTER — Ambulatory Visit: Payer: Medicare Other | Admitting: Family Medicine

## 2016-12-14 ENCOUNTER — Ambulatory Visit: Payer: Medicare Other | Admitting: Family Medicine

## 2016-12-29 ENCOUNTER — Other Ambulatory Visit: Payer: Self-pay

## 2016-12-29 ENCOUNTER — Telehealth: Payer: Self-pay | Admitting: Family Medicine

## 2016-12-29 MED ORDER — OSELTAMIVIR PHOSPHATE 75 MG PO CAPS: 75.0000 mg | ORAL_CAPSULE | Freq: Two times a day (BID) | ORAL | 0 refills | Status: DC

## 2016-12-29 NOTE — Telephone Encounter (Signed)
Pt called states pediatrician just Dx their daughter w/ (Type A Flu strain ) advised them to get Tamaflu med-- pls call Rx into [facility] per request-- Thanks Baker Janus

## 2016-12-29 NOTE — Telephone Encounter (Signed)
Pt denies fever, chills, and myalgias.  Pt states she does have a headache, clear nasal drainage, and slight cough without sputum production.  Pt is Type 2 diabetic.  Please advise.  Charyl Bigger, CMA

## 2016-12-29 NOTE — Progress Notes (Signed)
Per Dr. Raliegh Scarlet, ok to send in RX for Tamiflu 75mg  BID x 5 days.  RX sent to pharmacy.  Charyl Bigger, CMA

## 2016-12-30 DIAGNOSIS — M25532 Pain in left wrist: Secondary | ICD-10-CM | POA: Diagnosis not present

## 2016-12-30 DIAGNOSIS — S66812A Strain of other specified muscles, fascia and tendons at wrist and hand level, left hand, initial encounter: Secondary | ICD-10-CM | POA: Diagnosis not present

## 2017-01-03 DIAGNOSIS — M25532 Pain in left wrist: Secondary | ICD-10-CM | POA: Diagnosis not present

## 2017-01-20 DIAGNOSIS — S66812D Strain of other specified muscles, fascia and tendons at wrist and hand level, left hand, subsequent encounter: Secondary | ICD-10-CM | POA: Diagnosis not present

## 2017-01-20 DIAGNOSIS — M25532 Pain in left wrist: Secondary | ICD-10-CM | POA: Diagnosis not present

## 2017-01-31 DIAGNOSIS — M25532 Pain in left wrist: Secondary | ICD-10-CM | POA: Diagnosis not present

## 2017-02-16 DIAGNOSIS — M79672 Pain in left foot: Secondary | ICD-10-CM | POA: Diagnosis not present

## 2017-02-16 DIAGNOSIS — S63592A Other specified sprain of left wrist, initial encounter: Secondary | ICD-10-CM | POA: Diagnosis not present

## 2017-02-16 DIAGNOSIS — M25532 Pain in left wrist: Secondary | ICD-10-CM | POA: Diagnosis not present

## 2017-04-10 ENCOUNTER — Ambulatory Visit: Payer: Medicare Other | Admitting: Family Medicine

## 2017-04-28 DIAGNOSIS — G8929 Other chronic pain: Secondary | ICD-10-CM | POA: Diagnosis not present

## 2017-04-28 DIAGNOSIS — M722 Plantar fascial fibromatosis: Secondary | ICD-10-CM | POA: Diagnosis not present

## 2017-04-28 DIAGNOSIS — M79672 Pain in left foot: Secondary | ICD-10-CM | POA: Diagnosis not present

## 2017-05-02 ENCOUNTER — Encounter: Payer: Self-pay | Admitting: Adult Health

## 2017-05-02 ENCOUNTER — Ambulatory Visit (INDEPENDENT_AMBULATORY_CARE_PROVIDER_SITE_OTHER): Payer: PPO | Admitting: Adult Health

## 2017-05-02 ENCOUNTER — Encounter: Payer: Self-pay | Admitting: Nurse Practitioner

## 2017-05-02 DIAGNOSIS — Z87898 Personal history of other specified conditions: Secondary | ICD-10-CM | POA: Insufficient documentation

## 2017-05-02 DIAGNOSIS — R1032 Left lower quadrant pain: Secondary | ICD-10-CM | POA: Diagnosis not present

## 2017-05-02 DIAGNOSIS — R1011 Right upper quadrant pain: Secondary | ICD-10-CM

## 2017-05-02 DIAGNOSIS — R1012 Left upper quadrant pain: Secondary | ICD-10-CM | POA: Diagnosis not present

## 2017-05-02 LAB — POCT URINALYSIS DIPSTICK
Bilirubin, UA: NEGATIVE
Glucose, UA: NEGATIVE
Ketones, UA: NEGATIVE
Leukocytes, UA: NEGATIVE
Nitrite, UA: NEGATIVE
Protein, UA: NEGATIVE
Spec Grav, UA: 1.02 (ref 1.010–1.025)
Urobilinogen, UA: 0.2 E.U./dL
pH, UA: 7.5 (ref 5.0–8.0)

## 2017-05-02 NOTE — Assessment & Plan Note (Signed)
UA negative CBC drawn CT abdomen WO contrast ordered. GI referral placed. Increase water intake and eat small, easily digestable meals.

## 2017-05-02 NOTE — Patient Instructions (Addendum)
Abdominal Pain, Adult Abdominal pain can be caused by many things. Often, abdominal pain is not serious and it gets better with no treatment or by being treated at home. However, sometimes abdominal pain is serious. Your health care provider will do a medical history and a physical exam to try to determine the cause of your abdominal pain. Follow these instructions at home:  Take over-the-counter and prescription medicines only as told by your health care provider. Do not take a laxative unless told by your health care provider.  Drink enough fluid to keep your urine clear or pale yellow.  Watch your condition for any changes.  Keep all follow-up visits as told by your health care provider. This is important. Contact a health care provider if:  Your abdominal pain changes or gets worse.  You are not hungry or you lose weight without trying.  You are constipated or have diarrhea for more than 2-3 days.  You have pain when you urinate or have a bowel movement.  Your abdominal pain wakes you up at night.  Your pain gets worse with meals, after eating, or with certain foods.  You are throwing up and cannot keep anything down.  You have a fever. Get help right away if:  Your pain does not go away as soon as your health care provider told you to expect.  You cannot stop throwing up.  Your pain is only in areas of the abdomen, such as the right side or the left lower portion of the abdomen.  You have bloody or black stools, or stools that look like tar.  You have severe pain, cramping, or bloating in your abdomen.  You have signs of dehydration, such as:  Dark urine, very little urine, or no urine.  Cracked lips.  Dry mouth.  Sunken eyes.  Sleepiness.  Weakness. This information is not intended to replace advice given to you by your health care provider. Make sure you discuss any questions you have with your health care provider. Document Released: 08/31/2005 Document  Revised: 06/10/2016 Document Reviewed: 05/04/2016 Elsevier Interactive Patient Education  2017 [employer].  Will call when CBC results are available. UA results negative. CT of abdomen ordered. GI referral placed. Please eat small, easily digestable meals. Increase water intake to at least 100 ounces/day. Please call with any questions/concerns.

## 2017-05-02 NOTE — Assessment & Plan Note (Signed)
Loose stools since 2016. Negative EGD/Colonoscopy 2016. GI referral placed.

## 2017-05-02 NOTE — Progress Notes (Signed)
Subjective:    Patient ID: Samantha [name], female    DOB: 01-Aug-1981, 36 y.o.   MRN: [id number]  HPI :  Samantha [name] presents with LUQ/LLQ pain that began > 4 weeks ago (pain currently 0/10, can increase to 10/10).  Pain is worsened with moving and eating (however types of food do not matter).  Pain is improved with OTC Acetaminophen.  She has had chronic idiopathic diarrhea that began 2016 (estimates to have loose stool every 2 hr everyday).  She had EGD/colonoscopy 2016 that were both negative and she was not instructed to f/u with GI.  She denies N/V/fever/night sweats/poor appetite.  She denies RUQ or GERD sx's.  She does not follow a diet and eats "pretty much whatever I want". She denies family hx of gallbladder disease, colon ca, IBS.  Patient Care Team    Relationship Specialty Notifications Start End  [name], DO PCP - General Family Medicine  09/29/16   Pedro Earls, MD Attending Physician Family Medicine  09/29/16     Patient Active Problem List   Diagnosis Date Noted  . Abdominal pain, LUQ 05/02/2017  . Abdominal pain, LLQ 05/02/2017  . Hx of diarrhea 05/02/2017  . Environmental and seasonal allergies 09/29/2016  . GERD (gastroesophageal reflux disease) 09/29/2016  . Bipolar 1 disorder - Anxiety 09/28/2016  . Fibromyalgia 08/11/2016  . Diet-controlled type 2 diabetes mellitus ([place]) 12/29/2015  . OSA on CPAP 12/08/2014  . Obstructive sleep apnea syndrome, moderate 11/24/2014  . Fatty liver 09/05/2014  . S/P abdominal hysterectomy 08/15/2014  . Bladder dysfunction 04/30/2014  . Nephrolithiasis 03/04/2014  . Agenesis, corpus callosum ([place]) 02/17/2014  . GAD (generalized anxiety disorder) 02/17/2014  . History of blood transfusion 02/17/2014  . Obesity (BMI 30-39.9) 02/17/2014     Past Medical History:  Diagnosis Date  . Anemia   . Anxiety   . Depression    rx presc but not taken  . Diabetes mellitus without complication ([place])   . GERD (gastroesophageal  reflux disease)    no meds  . Headache(784.0)    tension and with anxiety hx  . History of kidney stones   . MVP (mitral valve prolapse)    as a child  . MVP (mitral valve prolapse)    echo 10  . Pneumonia    hx  . Seizures ([place])    non epileptic events per epilepsy monitoring unit  . Seizures ([place])    last seizure July 2015- does not convulse but has a sleep effect  . Sinus infection    recent on antibiotic  . Sleep apnea    has cpap does not use     Past Surgical History:  Procedure Laterality Date  . ABDOMINAL HYSTERECTOMY N/A 08/15/2014   Procedure: HYSTERECTOMY ABDOMINAL WITH CYSTOSCOPY;  Surgeon: Sanjuana Kava, MD;  Location: [facility];  Service: Gynecology;  Laterality: N/A;  . CESAREAN SECTION N/A   . SHOULDER ARTHROSCOPY WITH SUBACROMIAL DECOMPRESSION AND OPEN ROTATOR C Left 11/27/2015   Procedure: LEFT SHOULDER ARTHROSCOPY WITH SUBACROMIAL DECOMPRESSION, MINI OPEN ROTATOR CUFF REPAIR;  Surgeon: Netta Cedars, MD;  Location: [facility];  Service: Orthopedics;  Laterality: Left;  . TUBAL LIGATION       Family History  Problem Relation Age of Onset  . Diabetes Mother   . Hypertension Mother   . Healthy Father   . Cancer Maternal Grandfather        brain, lung, liver  . Heart attack Paternal Grandfather  History  Drug Use No     History  Alcohol Use No     History  Smoking Status  . Never Smoker  Smokeless Tobacco  . Never Used     Outpatient Encounter Prescriptions as of 05/02/2017  Medication Sig  . gabapentin (NEURONTIN) 300 MG capsule 900mg  TID  . [DISCONTINUED] oseltamivir (TAMIFLU) 75 MG capsule Take 1 capsule (75 mg total) by mouth 2 (two) times daily.   No facility-administered encounter medications on file as of 05/02/2017.     Allergies: Sulfa antibiotics; Sulfamethoxazole-trimethoprim; Duloxetine hcl; and Penicillins1  Body mass index is 33.73 kg/m.  Blood pressure 133/77, pulse 84, height 5' 7.75" (1.721 m), weight 220 lb 3.2 oz (99.9  kg), last menstrual period 08/05/2014.  Review of Systems  Constitutional: Positive for fatigue. Negative for activity change, appetite change, chills, diaphoresis, fever and unexpected weight change.  Respiratory: Negative for cough, chest tightness, shortness of breath and wheezing.   Cardiovascular: Negative for chest pain, palpitations and leg swelling.  Gastrointestinal: Negative for abdominal distention, abdominal pain, anal bleeding, blood in stool, constipation, diarrhea, nausea, rectal pain and vomiting.  Endocrine: Negative for cold intolerance, heat intolerance, polydipsia, polyphagia and polyuria.  Genitourinary: Negative for difficulty urinating, flank pain and hematuria.  Musculoskeletal: Positive for back pain. Negative for arthralgias, gait problem, joint swelling, myalgias, neck pain and neck stiffness.  Skin: Negative for color change, pallor, rash and wound.  Allergic/Immunologic: Negative for immunocompromised state.  Neurological: Negative for dizziness and headaches.  Hematological: Does not bruise/bleed easily.  Psychiatric/Behavioral: Negative for dysphoric mood and sleep disturbance. The patient is not nervous/anxious.        Objective:   Physical Exam  Constitutional: She is oriented to person, place, and time. She appears well-developed and well-nourished. No distress.  HENT:  Head: Normocephalic and atraumatic.  Right Ear: External ear normal.  Left Ear: External ear normal.  Eyes: Conjunctivae are normal. Pupils are equal, round, and reactive to light.  Neck: Normal range of motion. Neck supple.  Cardiovascular: Normal rate, regular rhythm, normal heart sounds and intact distal pulses.   No murmur heard. Pulmonary/Chest: Effort normal and breath sounds normal. No respiratory distress. She has no wheezes. She has no rales. She exhibits no tenderness.  Abdominal: Soft. Bowel sounds are normal. She exhibits no shifting dullness, no distension, no fluid wave, no  abdominal bruit, no ascites and no mass. There is no hepatosplenomegaly. There is tenderness in the left upper quadrant and left lower quadrant. There is no rigidity, no rebound, no guarding, no CVA tenderness, no tenderness at McBurney's point and negative Murphy's sign.  Musculoskeletal: Normal range of motion.  Lymphadenopathy:    She has no cervical adenopathy.  Neurological: She is alert and oriented to person, place, and time. Coordination normal.  Skin: Skin is warm and dry. No rash noted. She is not diaphoretic. No erythema. No pallor.  Psychiatric: She has a normal mood and affect. Her behavior is normal. Judgment normal.          Assessment & Plan:   1. LLQ pain   2. LUQ pain   3. Abdominal pain, LUQ   4. Abdominal pain, LLQ   5. Hx of diarrhea     Abdominal pain, LUQ UA negative CBC drawn CT abdomen WO contrast ordered. GI referral placed. Increase water intake and eat small, easily digestable meals.   Abdominal pain, LLQ UA negative CBC drawn CT abdomen WO contrast ordered. GI referral placed. Increase water intake and  eat small, easily digestable meals.  Hx of diarrhea Loose stools since 2016. Negative EGD/Colonoscopy 2016. GI referral placed.    FOLLOW-UP:  Return if symptoms worsen or fail to improve.

## 2017-05-03 LAB — CBC WITH DIFFERENTIAL/PLATELET
Basophils Absolute: 0 10*3/uL (ref 0.0–0.2)
Basos: 0 %
EOS (ABSOLUTE): 0.2 10*3/uL (ref 0.0–0.4)
Eos: 2 %
Hematocrit: 38.9 % (ref 34.0–46.6)
Hemoglobin: 12.7 g/dL (ref 11.1–15.9)
Immature Grans (Abs): 0 10*3/uL (ref 0.0–0.1)
Immature Granulocytes: 0 %
Lymphocytes Absolute: 2.7 10*3/uL (ref 0.7–3.1)
Lymphs: 31 %
MCH: 26.8 pg (ref 26.6–33.0)
MCHC: 32.6 g/dL (ref 31.5–35.7)
MCV: 82 fL (ref 79–97)
Monocytes Absolute: 0.5 10*3/uL (ref 0.1–0.9)
Monocytes: 6 %
Neutrophils Absolute: 5.4 10*3/uL (ref 1.4–7.0)
Neutrophils: 61 %
Platelets: 200 10*3/uL (ref 150–379)
RBC: 4.73 x10E6/uL (ref 3.77–5.28)
RDW: 14.5 % (ref 12.3–15.4)
WBC: 8.9 10*3/uL (ref 3.4–10.8)

## 2017-05-03 LAB — SPECIMEN STATUS REPORT

## 2017-05-05 ENCOUNTER — Ambulatory Visit: Payer: Medicare Other | Admitting: Nurse Practitioner

## 2017-05-10 ENCOUNTER — Ambulatory Visit (INDEPENDENT_AMBULATORY_CARE_PROVIDER_SITE_OTHER): Payer: PPO | Admitting: Family Medicine

## 2017-05-10 ENCOUNTER — Other Ambulatory Visit: Payer: Medicare Other

## 2017-05-10 ENCOUNTER — Encounter: Payer: Self-pay | Admitting: Family Medicine

## 2017-05-10 VITALS — BP 111/73 | HR 78 | Ht 67.75 in | Wt 219.0 lb

## 2017-05-10 DIAGNOSIS — M25571 Pain in right ankle and joints of right foot: Secondary | ICD-10-CM

## 2017-05-10 DIAGNOSIS — M79672 Pain in left foot: Secondary | ICD-10-CM | POA: Diagnosis not present

## 2017-05-10 DIAGNOSIS — M25561 Pain in right knee: Secondary | ICD-10-CM

## 2017-05-10 DIAGNOSIS — G8929 Other chronic pain: Secondary | ICD-10-CM | POA: Diagnosis not present

## 2017-05-10 DIAGNOSIS — M722 Plantar fascial fibromatosis: Secondary | ICD-10-CM | POA: Diagnosis not present

## 2017-05-10 DIAGNOSIS — W19XXXA Unspecified fall, initial encounter: Secondary | ICD-10-CM

## 2017-05-10 NOTE — Patient Instructions (Addendum)
Ice 15 minutes every couple of hours to top of knee and top of foot/ankle.    You can take 400mg  to 600mg  of ibuprofen as needed for pain.  Please go to [facility] for X-ray.    Follow up in 3 weeks if no improvement.

## 2017-05-11 ENCOUNTER — Ambulatory Visit: Payer: Medicare Other | Admitting: Nurse Practitioner

## 2017-05-15 DIAGNOSIS — M722 Plantar fascial fibromatosis: Secondary | ICD-10-CM | POA: Diagnosis not present

## 2017-05-15 DIAGNOSIS — M79662 Pain in left lower leg: Secondary | ICD-10-CM | POA: Diagnosis not present

## 2017-05-19 ENCOUNTER — Telehealth: Payer: Self-pay

## 2017-05-19 ENCOUNTER — Telehealth: Payer: Self-pay | Admitting: Family Medicine

## 2017-05-19 NOTE — Telephone Encounter (Signed)
[employer] imaging needed to get verbal to change image to CT abdomin and pelvis with contrast.  CPT 74177.  Will contact insurance for approval.  Fax sent to [email or web address].

## 2017-05-22 ENCOUNTER — Other Ambulatory Visit: Payer: Self-pay | Admitting: Family Medicine

## 2017-05-22 ENCOUNTER — Other Ambulatory Visit: Payer: Self-pay | Admitting: Adult Health

## 2017-05-24 ENCOUNTER — Ambulatory Visit
Admission: RE | Admit: 2017-05-24 | Discharge: 2017-05-24 | Disposition: A | Discharge status: Home / Self Care | Payer: PPO | Source: Ambulatory Visit | Attending: Adult Health | Admitting: Adult Health

## 2017-05-24 DIAGNOSIS — N200 Calculus of kidney: Secondary | ICD-10-CM | POA: Diagnosis not present

## 2017-05-24 DIAGNOSIS — R1012 Left upper quadrant pain: Secondary | ICD-10-CM

## 2017-05-24 DIAGNOSIS — R1032 Left lower quadrant pain: Secondary | ICD-10-CM

## 2017-05-24 MED ORDER — IOPAMIDOL (ISOVUE-300) INJECTION 61%
100.0000 mL | Freq: Once | INTRAVENOUS | Status: AC | PRN
  Administered 2017-05-24: 100 mL via INTRAVENOUS

## 2017-05-25 ENCOUNTER — Other Ambulatory Visit: Payer: Self-pay | Admitting: Adult Health

## 2017-05-25 ENCOUNTER — Telehealth: Payer: Self-pay

## 2017-05-25 DIAGNOSIS — K760 Fatty (change of) liver, not elsewhere classified: Secondary | ICD-10-CM

## 2017-05-25 NOTE — Telephone Encounter (Signed)
Spoke with patient regarding CT results and recommendations.  Appropriate appointments made.

## 2017-05-25 NOTE — Telephone Encounter (Signed)
-----   Message from Esaw Grandchild, NP sent at 05/25/2017 11:03 AM EDT ----- Good Morning,  Please call Samantha [name] and share that her CT results showed: 1) Appendix is normal 2) [place] are fine 3) Liver-shows "hepatic steatosis", which means fatty liver.  It is imperative that lifestyle improvements are made.  I put in order for Nutrition counseling and fasting labs.  Please have her schedule nurse visit for fasting labs.  Please encourage her to see Nutritionist ASAP. She was seen by GI in 2016, advise her to call them/make appt to discuss the chronic abdominal pain/nausea (since CT does not reveal any etiology for the abdominal pain). Please have her schedule appt with Dr. Raliegh Scarlet in 4-6 weeks. Thanks! PS-Dr. Raliegh Scarlet, this is just FYI for you. Valetta Fuller

## 2017-05-29 ENCOUNTER — Other Ambulatory Visit: Payer: Self-pay | Admitting: Adult Health

## 2017-05-29 ENCOUNTER — Other Ambulatory Visit (INDEPENDENT_AMBULATORY_CARE_PROVIDER_SITE_OTHER): Payer: PPO

## 2017-05-29 DIAGNOSIS — K760 Fatty (change of) liver, not elsewhere classified: Secondary | ICD-10-CM

## 2017-05-29 DIAGNOSIS — Z7689 Persons encountering health services in other specified circumstances: Secondary | ICD-10-CM | POA: Diagnosis not present

## 2017-05-30 ENCOUNTER — Encounter (HOSPITAL_COMMUNITY): Payer: Self-pay | Admitting: Emergency Medicine

## 2017-05-30 ENCOUNTER — Emergency Department (HOSPITAL_COMMUNITY)
Admission: EM | Admit: 2017-05-30 | Discharge: 2017-05-30 | Disposition: A | Discharge status: Home / Self Care | Payer: PPO | Attending: Emergency Medicine | Admitting: Emergency Medicine

## 2017-05-30 ENCOUNTER — Emergency Department (HOSPITAL_COMMUNITY): Payer: PPO

## 2017-05-30 DIAGNOSIS — R079 Chest pain, unspecified: Secondary | ICD-10-CM | POA: Diagnosis not present

## 2017-05-30 DIAGNOSIS — I341 Nonrheumatic mitral (valve) prolapse: Secondary | ICD-10-CM | POA: Diagnosis not present

## 2017-05-30 DIAGNOSIS — R0789 Other chest pain: Secondary | ICD-10-CM | POA: Diagnosis not present

## 2017-05-30 DIAGNOSIS — E119 Type 2 diabetes mellitus without complications: Secondary | ICD-10-CM | POA: Diagnosis not present

## 2017-05-30 DIAGNOSIS — Z79899 Other long term (current) drug therapy: Secondary | ICD-10-CM | POA: Insufficient documentation

## 2017-05-30 LAB — CBC
HCT: 39.5 % (ref 36.0–46.0)
Hemoglobin: 12.6 g/dL (ref 12.0–15.0)
MCH: 27.2 pg (ref 26.0–34.0)
MCHC: 31.9 g/dL (ref 30.0–36.0)
MCV: 85.1 fL (ref 78.0–100.0)
Platelets: 202 10*3/uL (ref 150–400)
RBC: 4.64 MIL/uL (ref 3.87–5.11)
RDW: 13.5 % (ref 11.5–15.5)
WBC: 8.9 10*3/uL (ref 4.0–10.5)

## 2017-05-30 LAB — COMPREHENSIVE METABOLIC PANEL
ALT: 15 IU/L (ref 0–32)
AST: 9 IU/L (ref 0–40)
Albumin/Globulin Ratio: 1.3 (ref 1.2–2.2)
Albumin: 4 g/dL (ref 3.5–5.5)
Alkaline Phosphatase: 94 IU/L (ref 39–117)
BUN/Creatinine Ratio: 22 (ref 9–23)
BUN: 14 mg/dL (ref 6–20)
Bilirubin Total: 0.2 mg/dL (ref 0.0–1.2)
CO2: 23 mmol/L (ref 20–29)
Calcium: 8.9 mg/dL (ref 8.7–10.2)
Chloride: 98 mmol/L (ref 96–106)
Creatinine, Ser: 0.63 mg/dL (ref 0.57–1.00)
GFR calc Af Amer: 133 mL/min/{1.73_m2} (ref >59)
GFR calc non Af Amer: 115 mL/min/{1.73_m2} (ref >59)
Globulin, Total: 3 g/dL (ref 1.5–4.5)
Glucose: 119 mg/dL — ABNORMAL HIGH (ref 65–99)
Potassium: 4.4 mmol/L (ref 3.5–5.2)
Sodium: 137 mmol/L (ref 134–144)
Total Protein: 7 g/dL (ref 6.0–8.5)

## 2017-05-30 LAB — VITAMIN D 25 HYDROXY (VIT D DEFICIENCY, FRACTURES): Vit D, 25-Hydroxy: 26.6 ng/mL — ABNORMAL LOW (ref 30.0–100.0)

## 2017-05-30 LAB — BASIC METABOLIC PANEL
Anion gap: 8 (ref 5–15)
BUN: 8 mg/dL (ref 6–20)
CO2: 25 mmol/L (ref 22–32)
Calcium: 8.3 mg/dL — ABNORMAL LOW (ref 8.9–10.3)
Chloride: 105 mmol/L (ref 101–111)
Creatinine, Ser: 0.81 mg/dL (ref 0.44–1.00)
GFR calc Af Amer: >60 mL/min (ref >60)
GFR calc non Af Amer: >60 mL/min (ref >60)
Glucose, Bld: 140 mg/dL — ABNORMAL HIGH (ref 65–99)
Potassium: 3.7 mmol/L (ref 3.5–5.1)
Sodium: 138 mmol/L (ref 135–145)

## 2017-05-30 LAB — CBC WITH DIFFERENTIAL/PLATELET

## 2017-05-30 LAB — I-STAT TROPONIN, ED: Troponin i, poc: 0 ng/mL (ref 0.00–0.08)

## 2017-05-30 LAB — HEMOGLOBIN A1C
Est. average glucose Bld gHb Est-mCnc: 134 mg/dL
Hgb A1c MFr Bld: 6.3 % — ABNORMAL HIGH (ref 4.8–5.6)

## 2017-05-30 MED ORDER — NAPROXEN SODIUM 550 MG PO TABS: 550.0000 mg | ORAL_TABLET | Freq: Two times a day (BID) | ORAL | 0 refills | Status: DC

## 2017-05-30 MED ORDER — TRAMADOL HCL 50 MG PO TABS: 50.0000 mg | ORAL_TABLET | Freq: Four times a day (QID) | ORAL | 0 refills | Status: DC | PRN

## 2017-05-30 MED ORDER — TRAMADOL HCL 50 MG PO TABS
50.0000 mg | ORAL_TABLET | Freq: Once | ORAL | Status: AC
  Administered 2017-05-30: 50 mg via ORAL
  Filled 2017-05-30: qty 1

## 2017-05-30 MED ORDER — NAPROXEN 250 MG PO TABS
500.0000 mg | ORAL_TABLET | Freq: Once | ORAL | Status: AC
  Administered 2017-05-30: 500 mg via ORAL
  Filled 2017-05-30: qty 2

## 2017-05-30 NOTE — ED Notes (Signed)
Pt departed in NAD, refused use of wheelchair.  

## 2017-05-30 NOTE — ED Triage Notes (Signed)
Reports CP ongoing since 1700 today, [facility] and given 324MG  aspirin and 1SL nitro with no relief. Described as a pressure "sitting on my chest."

## 2017-05-30 NOTE — ED Notes (Signed)
ED Provider at bedside. 

## 2017-05-30 NOTE — ED Provider Notes (Signed)
[facility] DEPT Provider Note   CSN: [id number] Arrival date & time: 05/30/17  2033  By signing my name below, I, Margit Banda, attest that this documentation has been prepared under the direction and in the presence of Delora Fuel, MD. Electronically Signed: Margit Banda, ED Scribe. 05/30/17. 11:23 PM.  History   Chief Complaint Chief Complaint  Patient presents with  . Chest Pain    HPI Samantha [name] is a 36 y.o. female with a PMHx of DM without complication, MVP, and anxiety, who presents to the Emergency Department complaining of 8/10, achy, sharp and at times dull, chest pressure that started ~ 5 pm today (05/30/17). Pt was moving ~ 30-40 pound bags of soil when pain started. Pain radiates to her neck and down her left arm. Pt was [place] at the fire station and was given 324MG  aspirin and 1SL nitro with no relief. Associated sx include SOB. Movement exacerbates her pain. Doesn't smoke. FHx of heart disease. Left shoulder surgery ~ 1.5 years ago. No medication taken at home. Pt denies nausea, vomiting, fever, and diaphoresis.  The history is provided by the patient. No language interpreter was used.    Past Medical History:  Diagnosis Date  . Anemia   . Anxiety   . Depression    rx presc but not taken  . Diabetes mellitus without complication ([place])   . GERD (gastroesophageal reflux disease)    no meds  . Headache(784.0)    tension and with anxiety hx  . History of kidney stones   . MVP (mitral valve prolapse)    as a child  . MVP (mitral valve prolapse)    echo 10  . Pneumonia    hx  . Seizures ([place])    non epileptic events per epilepsy monitoring unit  . Seizures ([place])    last seizure July 2015- does not convulse but has a sleep effect  . Sinus infection    recent on antibiotic  . Sleep apnea    has cpap does not use    Patient Active Problem List   Diagnosis Date Noted  . Abdominal pain, LUQ 05/02/2017  . Abdominal pain, LLQ 05/02/2017  . Hx of  diarrhea 05/02/2017  . Environmental and seasonal allergies 09/29/2016  . GERD (gastroesophageal reflux disease) 09/29/2016  . Bipolar 1 disorder - Anxiety 09/28/2016  . Fibromyalgia 08/11/2016  . Diet-controlled type 2 diabetes mellitus ([place]) 12/29/2015  . OSA on CPAP 12/08/2014  . Obstructive sleep apnea syndrome, moderate 11/24/2014  . Fatty liver 09/05/2014  . S/P abdominal hysterectomy 08/15/2014  . Bladder dysfunction 04/30/2014  . Nephrolithiasis 03/04/2014  . Agenesis, corpus callosum ([place]) 02/17/2014  . GAD (generalized anxiety disorder) 02/17/2014  . History of blood transfusion 02/17/2014  . Obesity (BMI 30-39.9) 02/17/2014    Past Surgical History:  Procedure Laterality Date  . ABDOMINAL HYSTERECTOMY N/A 08/15/2014   Procedure: HYSTERECTOMY ABDOMINAL WITH CYSTOSCOPY;  Surgeon: Sanjuana Kava, MD;  Location: [facility];  Service: Gynecology;  Laterality: N/A;  . CESAREAN SECTION N/A   . SHOULDER ARTHROSCOPY WITH SUBACROMIAL DECOMPRESSION AND OPEN ROTATOR C Left 11/27/2015   Procedure: LEFT SHOULDER ARTHROSCOPY WITH SUBACROMIAL DECOMPRESSION, MINI OPEN ROTATOR CUFF REPAIR;  Surgeon: Netta Cedars, MD;  Location: [facility];  Service: Orthopedics;  Laterality: Left;  . TUBAL LIGATION      OB History    No data available       Home Medications    Prior to Admission medications   Medication Sig Start Date  End Date Taking? Authorizing Provider  gabapentin (NEURONTIN) 300 MG capsule 900mg  TID 11/08/16   [name], DO    Family History Family History  Problem Relation Age of Onset  . Diabetes Mother   . Hypertension Mother   . Healthy Father   . Cancer Maternal Grandfather        brain, lung, liver  . Heart attack Paternal Grandfather     Social History Social History  Substance Use Topics  . Smoking status: Never Smoker  . Smokeless tobacco: Never Used  . Alcohol use No     Allergies   Sulfa antibiotics; Sulfamethoxazole-trimethoprim; Duloxetine hcl; and  Penicillins   Review of Systems Review of Systems  Constitutional: Negative for diaphoresis and fever.  Respiratory: Positive for shortness of breath.   Cardiovascular: Positive for chest pain.  Gastrointestinal: Negative for nausea and vomiting.  Musculoskeletal: Positive for arthralgias and neck pain.  All other systems reviewed and are negative.    Physical Exam Updated Vital Signs BP 123/67 (BP Location: Right Arm)   Pulse 78   Temp 97.8 F (36.6 C) (Oral)   Resp 16   Ht 5' 9.5" (1.765 m)   Wt 211 lb (95.7 kg)   LMP 08/05/2014   SpO2 96%   BMI 30.71 kg/m   Physical Exam  Constitutional: She is oriented to person, place, and time. She appears well-developed and well-nourished.  HENT:  Head: Normocephalic and atraumatic.  Eyes: EOM are normal. Pupils are equal, round, and reactive to light.  Neck: Normal range of motion. Neck supple. No JVD present.  Posterior tenderness to left side of neck.   Cardiovascular: Normal rate, regular rhythm and normal heart sounds.   No murmur heard. Pulmonary/Chest: Effort normal and breath sounds normal. She has no wheezes. She has no rales. She exhibits tenderness.  Tenderness to anterior upper left chest.  Abdominal: Soft. Bowel sounds are normal. She exhibits no distension and no mass. There is no tenderness.  Musculoskeletal: Normal range of motion. She exhibits no edema.  Lymphadenopathy:    She has no cervical adenopathy.  Neurological: She is alert and oriented to person, place, and time. No cranial nerve deficit. She exhibits normal muscle tone. Coordination normal.  Skin: Skin is warm and dry. No rash noted.  Psychiatric: She has a normal mood and affect. Her behavior is normal. Judgment and thought content normal.  Nursing note and vitals reviewed.    ED Treatments / Results  DIAGNOSTIC STUDIES: Oxygen Saturation is 96% on RA, adequate by my interpretation.   COORDINATION OF CARE: 11:23 PM-Discussed next steps with  pt. Pt verbalized understanding and is agreeable with the plan.   Labs (all labs ordered are listed, but only abnormal results are displayed) Labs Reviewed  BASIC METABOLIC PANEL - Abnormal; Notable for the following:       Result Value   Glucose, Bld 140 (*)    Calcium 8.3 (*)    All other components within normal limits  CBC  I-STAT TROPOININ, ED    EKG  EKG Interpretation  Date/Time:  Tuesday May 30 2017 20:39:40 EDT Ventricular Rate:  79 PR Interval:  168 QRS Duration: 102 QT Interval:  392 QTC Calculation: 449 R Axis:   -9 Text Interpretation:  Normal sinus rhythm Incomplete right bundle branch block Left ventricular hypertrophy Nonspecific T wave abnormality Abnormal ECG When compared with ECG of 09/02/2016, No significant change was found Confirmed by Delora Fuel ([id number]) on 05/30/2017 11:07:54 PM  Radiology Dg Chest 2 View  Result Date: 05/30/2017 CLINICAL DATA:  Acute left chest pain today. EXAM: CHEST  2 VIEW COMPARISON:  09/02/2016 and prior exam FINDINGS: The cardiomediastinal silhouette is unremarkable. There is no evidence of focal airspace disease, pulmonary edema, suspicious pulmonary nodule/mass, pleural effusion, or pneumothorax. No acute bony abnormalities are identified. IMPRESSION: No active cardiopulmonary disease. Electronically Signed   By: Margarette Canada M.D.   On: 05/30/2017 21:35    Procedures Procedures (including critical care time)  Medications Ordered in ED Medications  naproxen (NAPROSYN) tablet 500 mg (not administered)  traMADol (ULTRAM) tablet 50 mg (not administered)     Initial Impression / Assessment and Plan / ED Course  I have reviewed the triage vital signs and the nursing notes.  Pertinent labs & imaging results that were available during my care of the patient were reviewed by me and considered in my medical decision making (see chart for details).  Chest pain which seems to be clearly chest wall pain. Onset was following  some lifting, and is worse with movement, and reproduced by palpation. Old records are reviewed, and she has a prior ED visit for chest wall pain. ECG is unchanged, troponin normal, chest x-ray normal. Heart Score = 2, which is associated with a 30 day risk of major adverse cardiac events of 0.9-1.7%. She is discharged with prescriptions for naproxen and tramadol, follow-up with PCP.  Final Clinical Impressions(s) / ED Diagnoses   Final diagnoses:  Chest wall pain    New Prescriptions New Prescriptions   NAPROXEN SODIUM (ANAPROX) 550 MG TABLET    Take 1 tablet (550 mg total) by mouth 2 (two) times daily with a meal.   TRAMADOL (ULTRAM) 50 MG TABLET    Take 1 tablet (50 mg total) by mouth every 6 (six) hours as needed.   I personally performed the services described in this documentation, which was scribed in my presence. The recorded information has been reviewed and is accurate.      Delora Fuel, MD 56/81/27 [id number]

## 2017-05-30 NOTE — Discharge Instructions (Signed)
Apply ice several times a day. Take acetaminophen (Tylenol) as needed for additional pain relief.

## 2017-05-31 LAB — CBC WITH DIFFERENTIAL/PLATELET
Basophils Absolute: 0 10*3/uL (ref 0.0–0.2)
Basos: 0 %
EOS (ABSOLUTE): 0.1 10*3/uL (ref 0.0–0.4)
Eos: 2 %
Hematocrit: 42.9 % (ref 34.0–46.6)
Hemoglobin: 13.4 g/dL (ref 11.1–15.9)
Immature Grans (Abs): 0 10*3/uL (ref 0.0–0.1)
Immature Granulocytes: 0 %
Lymphocytes Absolute: 2.2 10*3/uL (ref 0.7–3.1)
Lymphs: 31 %
MCH: 27 pg (ref 26.6–33.0)
MCHC: 31.2 g/dL — ABNORMAL LOW (ref 31.5–35.7)
MCV: 86 fL (ref 79–97)
Monocytes Absolute: 0.4 10*3/uL (ref 0.1–0.9)
Monocytes: 5 %
Neutrophils Absolute: 4.5 10*3/uL (ref 1.4–7.0)
Neutrophils: 62 %
Platelets: 208 10*3/uL (ref 150–379)
RBC: 4.97 x10E6/uL (ref 3.77–5.28)
RDW: 14.5 % (ref 12.3–15.4)
WBC: 7.3 10*3/uL (ref 3.4–10.8)

## 2017-05-31 LAB — LIPID PANEL W/O CHOL/HDL RATIO
Cholesterol, Total: 151 mg/dL (ref 100–199)
HDL: 50 mg/dL (ref >39)
LDL Calculated: 82 mg/dL (ref 0–99)
Triglycerides: 93 mg/dL (ref 0–149)
VLDL Cholesterol Cal: 19 mg/dL (ref 5–40)

## 2017-05-31 LAB — SPECIMEN STATUS REPORT

## 2017-06-01 ENCOUNTER — Ambulatory Visit: Payer: Medicare Other | Admitting: Family Medicine

## 2017-06-14 ENCOUNTER — Telehealth: Payer: Self-pay | Admitting: Family Medicine

## 2017-06-14 NOTE — Telephone Encounter (Signed)
Called and spoke to patient - she wanted values and will discuss in more detail with Dr Raliegh Scarlet on Monday 06/19/2016 at her follow appointment.  MPulliam, CMA/RT(R)

## 2017-06-14 NOTE — Telephone Encounter (Signed)
Patient's husband called and they want to talk about recent lab work.

## 2017-06-19 ENCOUNTER — Encounter: Payer: Self-pay | Admitting: Family Medicine

## 2017-06-19 ENCOUNTER — Ambulatory Visit (INDEPENDENT_AMBULATORY_CARE_PROVIDER_SITE_OTHER): Payer: PPO | Admitting: Family Medicine

## 2017-06-19 VITALS — BP 130/85 | HR 82 | Ht 67.75 in | Wt 219.0 lb

## 2017-06-19 DIAGNOSIS — E119 Type 2 diabetes mellitus without complications: Secondary | ICD-10-CM | POA: Diagnosis not present

## 2017-06-19 DIAGNOSIS — L259 Unspecified contact dermatitis, unspecified cause: Secondary | ICD-10-CM | POA: Insufficient documentation

## 2017-06-19 DIAGNOSIS — E669 Obesity, unspecified: Secondary | ICD-10-CM

## 2017-06-19 DIAGNOSIS — L739 Follicular disorder, unspecified: Secondary | ICD-10-CM | POA: Diagnosis not present

## 2017-06-19 DIAGNOSIS — W57XXXA Bitten or stung by nonvenomous insect and other nonvenomous arthropods, initial encounter: Secondary | ICD-10-CM | POA: Diagnosis not present

## 2017-06-19 DIAGNOSIS — Z8614 Personal history of Methicillin resistant Staphylococcus aureus infection: Secondary | ICD-10-CM

## 2017-06-19 DIAGNOSIS — K529 Noninfective gastroenteritis and colitis, unspecified: Secondary | ICD-10-CM | POA: Diagnosis not present

## 2017-06-19 DIAGNOSIS — M797 Fibromyalgia: Secondary | ICD-10-CM | POA: Diagnosis not present

## 2017-06-19 DIAGNOSIS — S30860A Insect bite (nonvenomous) of lower back and pelvis, initial encounter: Secondary | ICD-10-CM | POA: Diagnosis not present

## 2017-06-19 HISTORY — DX: Personal history of Methicillin resistant Staphylococcus aureus infection: Z86.14

## 2017-06-19 NOTE — Patient Instructions (Addendum)
Please continue to use warm compresses on the pustules.  I would use a warm compress at least 5-6 times per day please let them drain as much as possible.  If they stopped draining or the redness around them grows not from the Band-Aid but from the infection, please let me know we can always put you on doxycycline or a like antibiotic.  In 3 months from now please make a follow-up appointment and come in for a A1c and office visit with me.  We will also be monitoring your weight as well.  Your goal for next office visit:   Use [employer] app--> it's free.    Let shoot for 10 pounds in 3 months. You, goal will be to be 209.  Drink 1/2 wt in ounces per water per day- so for you, it would be roughly 110 oz/d  Please make sure you follow up for that dietitian appointment in the near future- the 25th as well.     Behavior Modification Ideas for Weight Management  Weight management involves adopting a healthy lifestyle that includes a knowledge of nutrition and exercise, a positive attitude and the right kind of motivation. Internal motives such as better health, increased energy, self-esteem and personal control increase your chances of lifelong weight management success.  Remember to have realistic goals and think long-term success. Believe in yourself and you can do it. The following information will give you ideas to help you meet your goals.  Control Your Home Environment  Eat only while sitting down at the kitchen or dining room table. Do not eat while watching television, reading, cooking, talking on the phone, standing at the refrigerator or working on the computer. Keep tempting foods out of the house - don't buy them. Keep tempting foods out of sight. Have low-calorie foods ready to eat. Unless you are preparing a meal, stay out of the kitchen. Have healthy snacks at your disposal, such as small pieces of fruit, vegetables, canned fruit, pretzels, low-fat string cheese and nonfat cottage  cheese.  Control Your Work Environment  Do not eat at [employer] or keep tempting snacks at your desk. If you get hungry between meals, plan healthy snacks and bring them with you to work. During your breaks, go for a walk instead of eating. If you work around food, plan in advance the one item you will eat at mealtime. Make it inconvenient to nibble on food by chewing gum, sugarless candy or drinking water or another low-calorie beverage. Do not work through meals. Skipping meals slows down metabolism and may result in overeating at the next meal. If food is available for special occasions, either pick the healthiest item, nibble on low-fat snacks brought from home, don't have anything offered, choose one option and have a small amount, or have only a beverage.  Control Your Mealtime Environment  Serve your plate of food at the stove or kitchen counter. Do not put the serving dishes on the table. If you do put dishes on the table, remove them immediately when finished eating. Fill half of your plate with vegetables, a quarter with lean protein and a quarter with starch. Use smaller plates, bowls and glasses. A smaller portion will look large when it is in a little dish. Politely refuse second helpings. When fixing your plate, limit portions of food to one scoop/serving or less.   Daily Food Management  Replace eating with another activity that you will not associate with food. Wait 20 minutes before  eating something you are craving. Drink a large glass of water or diet soda before eating. Always have a big glass or bottle of water to drink throughout the day. Avoid high-calorie add-ons such as cream with your coffee, butter, mayonnaise and salad dressings.  Shopping: Do not shop when hungry or tired. Shop from a list and avoid buying anything that is not on your list. If you must have tempting foods, buy individual-sized packages and try to find a lower-calorie alternative. Don't  taste test in the store. Read food labels. Compare products to help you make the healthiest choices.  Preparation: Chew a piece of gum while cooking meals. Use a quarter teaspoon if you taste test your food. Try to only fix what you are going to eat, leaving yourself no chance for seconds. If you have prepared more food than you need, portion it into individual containers and freeze or refrigerate immediately. Don't snack while cooking meals.  Eating: Eat slowly. Remember it takes about 20 minutes for your stomach to send a message to your brain that it is full. Don't let fake hunger make you think you need more. The ideal way to eat is to take a bite, put your utensil down, take a sip of water, cut your next bite, take a bit, put your utensil down and so on. Do not cut your food all at one time. Cut only as needed. Take small bites and chew your food well. Stop eating for a minute or two at least once during a meal or snack. Take breaks to reflect and have conversation.  Cleanup and Leftovers: Label leftovers for a specific meal or snack. Freeze or refrigerate individual portions of leftovers. Do not clean up if you are still hungry.  Eating Out and Social Eating  Do not arrive hungry. Eat something light before the meal. Try to fill up on low-calorie foods, such as vegetables and fruit, and eat smaller portions of the high-calorie foods. Eat foods that you like, but choose small portions. If you want seconds, wait at least 20 minutes after you have eaten to see if you are actually hungry or if your eyes are bigger than your stomach. Limit alcoholic beverages. Try a soda water with a twist of lime. Do not skip other meals in the day to save room for the special event.  At Restaurants: Order  la carte rather than buffet style. Order some vegetables or a salad for an appetizer instead of eating bread. If you order a high-calorie dish, share it with someone. Try an after-dinner mint  with your coffee. If you do have dessert, share it with two or more people. Don't overeat because you do not want to waste food. Ask for a doggie bag to take extra food home. Tell the server to put half of your entree in a to go bag before the meal is served to you. Ask for salad dressing, gravy or high-fat sauces on the side. Dip the tip of your fork in the dressing before each bite. If bread is served, ask for only one piece. Try it plain without butter or oil. At [employer] where oil and vinegar is served with bread, use only a small amount of oil and a lot of vinegar for dipping.  At a Friend[facility] House: Offer to bring a dish, appetizer or dessert that is low in calories. Serve yourself small portions or tell the host that you only want a small amount. Stand or sit away from the  snack table. Stay away from the kitchen or stay busy if you are near the food. Limit your alcohol intake.  At [employer] and Cafeterias: Cover most of your plate with lettuce and/or vegetables. Use a salad plate instead of a dinner plate. After eating, clear away your dishes before having coffee or tea.  Entertaining at Home: Explore low-fat, low-cholesterol cookbooks. Use single-serving foods like chicken breasts or hamburger patties. Prepare low-calorie appetizers and desserts.   Holidays: Keep tempting foods out of sight. Decorate the house without using food. Have low-calorie beverages and foods on hand for guests. Allow yourself one planned treat a day. Don't skip meals to save up for the holiday feast. Eat regular, planned meals.   Exercise Well  Make exercise a priority and a planned activity in the day. If possible, walk the entire or part of the distance to work. Get an exercise buddy. Go for a walk with a colleague during one of your breaks, go to the gym, run or take a walk with a friend, walk in the mall with a shopping companion. Park at the end of the parking lot and walk to the store  or office entrance. Always take the stairs all of the way or at least part of the way to your floor. If you have a desk job, walk around the office frequently. Do leg lifts while sitting at your desk. Do something outside on the weekends like going for a hike or a bike ride.   Have a Healthy Attitude  Make health your weight management priority. Be realistic. Have a goal to achieve a healthier you, not necessarily the lowest weight or ideal weight based on calculations or tables. Focus on a healthy eating style, not on dieting. Dieting usually lasts for a short amount of time and rarely produces long-term success. Think long term. You are developing new healthy behaviors to follow next month, in a year and in a decade.    This information is for educational purposes only and is not intended to replace the advice of your doctor or health care provider. We encourage you to discuss with your doctor any questions or concerns you may have.        Guidelines for Losing Weight   We want weight loss that will last so you should lose 1-2 pounds a week.  THAT IS IT! Please pick THREE things a month to change. Once it is a habit check off the item. Then pick another three items off the list to become habits.  If you are already doing a habit on the list GREAT!  Cross that item off!  Don't drink your calories. Ie, alcohol, soda, fruit juice, and sweet tea.   Drink more water. Drink a glass when you feel hungry or before each meal.   Eat breakfast - Complex carb and protein (likeDannon light and fit yogurt, oatmeal, fruit, eggs, [place] bacon).  Measure your cereal.  Eat no more than one cup a day. (ie Kashi)  Eat an apple a day.  Add a vegetable a day.  Try a new vegetable a month.  Use Pam! Stop using oil or butter to cook.  Don't finish your plate or use smaller plates.  Share your dessert.  Eat sugar free Jello for dessert or frozen grapes.  Don't eat 2-3 hours before  bed.  Switch to whole wheat bread, pasta, and brown rice.  Make healthier choices when you eat out. No fries!  Pick baked chicken, NOT fried.  Don't forget to SLOW DOWN when you eat. It is not going anywhere.   Take the stairs.  Park far away in the parking lot  Lift soup cans (or weights) for 10 minutes while watching TV.  Walk at work for 10 minutes during break.  Walk outside 1 time a week with your friend, kids, dog, or significant other.  Start a walking group at church.  Walk the mall as much as you can tolerate.   Keep a food diary.  Weigh yourself daily.  Walk for 15 minutes 3 days per week.  Cook at home more often and eat out less. If life happens and you go back to old habits, it is okay.  Just start over. You can do it!  If you experience chest pain, get short of breath, or tired during the exercise, please stop immediately and inform your doctor.    Before you even begin to attack a weight-loss plan, it pays to remember this: You are not fat. You have fat. Losing weight isn't about blame or shame; it's simply another achievement to accomplish. Dieting is like any other skill-you have to buckle down and work at it. As long as you act in a smart, reasonable way, you'll ultimately get where you want to be. Here are some weight loss pearls for you.   1. It's Not a Diet. It's a Lifestyle Thinking of a diet as something you're on and suffering through only for the short term doesn't work. To shed weight and keep it off, you need to make permanent changes to the way you eat. It's OK to indulge occasionally, of course, but if you cut calories temporarily and then revert to your old way of eating, you'll gain back the weight quicker than you can say yo-yo. Use it to lose it. Research shows that one of the best predictors of long-term weight loss is how many pounds you drop in the first month. For that reason, nutritionists often suggest being stricter for the first two  weeks of your new eating strategy to build momentum. Cut out added sugar and alcohol and avoid unrefined carbs. After that, figure out how you can reincorporate them in a way that's healthy and maintainable.  2. There's a Right Way to Exercise Working out burns calories and fat and boosts your metabolism by building muscle. But those trying to lose weight are notorious for overestimating the number of calories they burn and underestimating the amount they take in. Unfortunately, your system is biologically programmed to hold on to extra pounds and that means when you start exercising, your body senses the deficit and ramps up its hunger signals. If you're not diligent, you'll eat everything you burn and then some. Use it, to lose it. Cardio gets all the exercise glory, but strength and interval training are the real heroes. They help you build lean muscle, which in turn increases your metabolism and calorie-burning ability 3. Don't Overreact to Mild Hunger Some people have a hard time losing weight because of hunger anxiety. To them, being hungry is bad-something to be avoided at all costs-so they carry snacks with them and eat when they don't need to. Others eat because they're stressed out or bored. While you never want to get to the point of being ravenous (that's when bingeing is likely to happen), a hunger pang, a craving, or the fact that it's 3:00 p.m. should not send you racing for the vending machine or obsessing about the energy bar in  your purse. Ideally, you should put off eating until your stomach is growling and it's difficult to concentrate.  Use it to lose it. When you feel the urge to eat, use the HALT method. Ask yourself, Am I really hungry? Or am I angry or anxious, lonely or bored, or tired? If you're still not certain, try the apple test. If you're truly hungry, an apple should seem delicious; if it doesn't, something else is going on. Or you can try drinking water and making yourself  busy, if you are still hungry try a healthy snack.  4. Not All Calories Are Created Equal The mechanics of weight loss are pretty simple: Take in fewer calories than you use for energy. But the kind of food you eat makes all the difference. Processed food that's high in saturated fat and refined starch or sugar can cause inflammation that disrupts the hormone signals that tell your brain you're full. The result: You eat a lot more.  Use it to lose it. Clean up your diet. Swap in whole, unprocessed foods, including vegetables, lean protein, and healthy fats that will fill you up and give you the biggest nutritional bang for your calorie buck. In a few weeks, as your brain starts receiving regular hunger and fullness signals once again, you'll notice that you feel less hungry overall and naturally start cutting back on the amount you eat.  5. Protein, Produce, and Plant-Based Fats Are Your Weight-Loss Trinity Here's why eating the three Ps regularly will help you drop pounds. Protein fills you up. You need it to build lean muscle, which keeps your metabolism humming so that you can torch more fat. People in a weight-loss program who ate double the recommended daily allowance for protein (about 110 grams for a 150-pound woman) lost 70 percent of their weight from fat, while people who ate the RDA lost only about 40 percent, one study found. Produce is packed with filling fiber. "It's very difficult to consume too many calories if you're eating a lot of vegetables. Example: Three cups of broccoli is a lot of food, yet only 93 calories. (Fruit is another story. It can be easy to overeat and can contain a lot of calories from sugar, so be sure to monitor your intake.) Plant-based fats like olive oil and those in avocados and nuts are healthy and extra satiating.  Use it to lose it. Aim to incorporate each of the three Ps into every meal and snack. People who eat protein throughout the day are able to keep weight  off, according to a study in the [place] of Clinical Nutrition. In addition to meat, poultry and seafood, good sources are beans, lentils, eggs, tofu, and yogurt. As for fat, keep portion sizes in check by measuring out salad dressing, oil, and nut butters (shoot for one to two tablespoons). Finally, eat veggies or a little fruit at every meal. People who did that consumed 308 fewer calories but didn't feel any hungrier than when they didn't eat more produce.  7. How You Eat Is As Important As What You Eat In order for your brain to register that you're full, you need to focus on what you're eating. Sit down whenever you eat, preferably at a table. Turn off the TV or computer, put down your phone, and look at your food. Smell it. Chew slowly, and don't put another bite on your fork until you swallow. When women ate lunch this attentively, they consumed 30 percent less when snacking  later than those who listened to an audiobook at lunchtime, according to a study in the [place] of Nutrition. 8. Weighing Yourself Really Works The scale provides the best evidence about whether your efforts are paying off. Seeing the numbers tick up or down or stagnate is motivation to keep going-or to rethink your approach. A 2015 study at [facility] found that daily weigh-ins helped people lose more weight, keep it off, and maintain that loss, even after two years. Use it to lose it. Step on the scale at the same time every day for the best results. If your weight shoots up several pounds from one weigh-in to the next, don't freak out. Eating a lot of salt the night before or having your period is the likely culprit. The number should return to normal in a day or two. It's a steady climb that you need to do something about. 9. Too Much Stress and Too Little Sleep Are Your Enemies When you're tired and frazzled, your body cranks up the production of cortisol, the stress hormone that can cause carb  cravings. Not getting enough sleep also boosts your levels of ghrelin, a hormone associated with hunger, while suppressing leptin, a hormone that signals fullness and satiety. People on a diet who slept only five and a half hours a night for two weeks lost 55 percent less fat and were hungrier than those who slept eight and a half hours, according to a study in the [place]. Use it to lose it. Prioritize sleep, aiming for seven hours or more a night, which research shows helps lower stress. And make sure you're getting quality zzz's. If a snoring spouse or a fidgety cat wakes you up frequently throughout the night, you may end up getting the equivalent of just four hours of sleep, according to a study from [facility]. Keep pets out of the bedroom, and use a white-noise app to drown out snoring. 10. You Will Hit a plateau-And You Can Bust Through It As you slim down, your body releases much less leptin, the fullness hormone.  If you're not strength training, start right now. Building muscle can raise your metabolism to help you overcome a plateau. To keep your body challenged and burning calories, incorporate new moves and more intense intervals into your workouts or add another sweat session to your weekly routine. Alternatively, cut an extra 100 calories or so a day from your diet. Now that you've lost weight, your body simply doesn't need as much fuel.    Since food equals calories, in order to lose weight you must either eat fewer calories, exercise more to burn off calories with activity, or both. Food that is not used to fuel the body is stored as fat. A major component of losing weight is to make smarter food choices. Here's how:  1)   Limit non-nutritious foods, such as: Sugar, honey, syrups and candy Pastries, donuts, pies, cakes and cookies Soft drinks, sweetened juices and alcoholic beverages  2)  Cut down on high-fat foods by: - Choosing poultry, fish  or lean red meat - Choosing low-fat cooking methods, such as baking, broiling, steaming, grilling and boiling - Using low-fat or non-fat dairy products - Using vinaigrette, herbs, lemon or fat-free salad dressings - Avoiding fatty meats, such as bacon, sausage, franks, ribs and luncheon meats - Avoiding high-fat snacks like nuts, chips and chocolate - Avoiding fried foods - Using less butter, margarine, oil and mayonnaise - Avoiding high-fat  gravies, cream sauces and cream-based soups  3) Eat a variety of foods, including: - Fruit and vegetables that are raw, steamed or baked - Whole grains, breads, cereal, rice and pasta - Dairy products, such as low-fat or non-fat milk or yogurt, low-fat cottage cheese and low-fat cheese - Protein-rich foods like chicken, [place], fish, lean meat and legumes, or beans  4) Change your eating habits by: - Eat three balanced meals a day to help control your hunger - Watch portion sizes and eat small servings of a variety of foods - Choose low-calorie snacks - Eat only when you are hungry and stop when you are satisfied - Eat slowly and try not to perform other tasks while eating - Find other activities to distract you from food, such as walking, taking up a hobby or being involved in the community - Include regular exercise in your daily routine ( minimum of 20 min of moderate-intensity exercise at least 5 days/week)  - Find a support group, if necessary, for emotional support in your weight loss journey           Easy ways to cut 100 calories   1. Eat your eggs with hot sauce OR salsa instead of cheese.  Eggs are great for breakfast, but many people consider eggs and cheese to be BFFs. Instead of cheese-1 oz. of cheddar has 114 calories-top your eggs with hot sauce, which contains no calories and helps with satiety and metabolism. Salsa is also a great option!!  2. Top your toast, waffles or pancakes with fresh berries instead of jelly or  syrup. Half a cup of berries-fresh, frozen or thawed-has about 40 calories, compared with 2 tbsp. of maple syrup or jelly, which both have about 100 calories. The berries will also give you a good punch of fiber, which helps keep you full and satisfied and won't spike blood sugar quickly like the jelly or syrup. 3. Swap the non-fat latte for black coffee with a splash of half-and-half. Contrary to its name, that non-fat latte has 130 calories and a startling 19g of carbohydrates per 16 oz. serving. Replacing that 'light' drinkable dessert with a black coffee with a splash of half-and-half saves you more than 100 calories per 16 oz. serving. 4. Sprinkle salads with freeze-dried raspberries instead of dried cranberries. If you want a sweet addition to your nutritious salad, stay away from dried cranberries. They have a whopping 130 calories per  cup and 30g carbohydrates. Instead, sprinkle freeze-dried raspberries guilt-free and save more than 100 calories per  cup serving, adding 3g of belly-filling fiber. 5. Go for mustard in place of mayo on your sandwich. Mustard can add really nice flavor to any sandwich, and there are tons of varieties, from spicy to honey. A serving of mayo is 95 calories, versus 10 calories in a serving of mustard.  Or try an avocado mayo spread: You can find the recipe few click this link: https://www.californiaavocado.com/recipes/recipe-container/[place]-avocado-mayo 6. Choose a DIY salad dressing instead of the store-bought kind. Mix Dijon or whole grain mustard with low-fat Kefir or red wine vinegar and garlic. 7. Use hummus as a spread instead of a dip. Use hummus as a spread on a high-fiber cracker or tortilla with a sandwich and save on calories without sacrificing taste. 8. Pick just one salad "accessory." Salad isn't automatically a calorie winner. It's easy to over-accessorize with toppings. Instead of topping your salad with nuts, avocado and cranberries (all  three will clock in at 313 calories), just  pick one. The next day, choose a different accessory, which will also keep your salad interesting. You don't wear all your jewelry every day, right? 9. Ditch the white pasta in favor of spaghetti squash. One cup of cooked spaghetti squash has about 40 calories, compared with traditional spaghetti, which comes with more than 200. Spaghetti squash is also nutrient-dense. It's a good source of fiber and Vitamins A and C, and it can be eaten just like you would eat pasta-with a great tomato sauce and [place] meatballs or with pesto, tofu and spinach, for example. 10. Dress up your chili, soups and stews with non-fat [place] yogurt instead of sour cream. Just a 'dollop' of sour cream can set you back 115 calories and a whopping 12g of fat-seven of which are of the artery-clogging variety. Added bonus: [place] yogurt is packed with muscle-building protein, calcium and B Vitamins. 11. Mash cauliflower instead of mashed potatoes. One cup of traditional mashed potatoes-in all their creamy goodness-has more than 200 calories, compared to mashed cauliflower, which you can typically eat for less than 100 calories per 1 cup serving. Cauliflower is a great source of the antioxidant indole-3-carbinol (I3C), which may help reduce the risk of some cancers, like breast cancer. 12. Ditch the ice cream sundae in favor of a [place] yogurt parfait. Instead of a cup of ice cream or fro-yo for dessert, try 1 cup of nonfat Greek yogurt topped with fresh berries and a sprinkle of cacao nibs. Both toppings are packed with antioxidants, which can help reduce cellular inflammation and oxidative damage. And the comparison is a no-brainer: One cup of ice cream has about 275 calories; one cup of frozen yogurt has about 230; and a cup of Greek yogurt has just 130, plus twice the protein, so you're less likely to return to the freezer for a second helping. 13. Put olive oil in a spray container instead of  using it directly from the bottle. Each tablespoon of olive oil is 120 calories and 15g of fat. Use a mister instead of pouring it straight into the pan or onto a salad. This allows for portion control and will save you more than 100 calories. 14. When baking, substitute canned pumpkin for butter or oil. Canned pumpkin-not pumpkin pie mix-is loaded with Vitamin A, which is important for skin and eye health, as well as immunity. And the comparisons are pretty crazy:  cup of canned pumpkin has about 40 calories, compared to butter or oil, which has more than 800 calories. Yes, 800 calories. Applesauce and mashed banana can also serve as good substitutions for butter or oil, usually in a 1:1 ratio. 15. Top casseroles with high-fiber cereal instead of breadcrumbs. Breadcrumbs are typically made with white bread, while breakfast cereals contain 5-9g of fiber per serving. Not only will you save more than 150 calories per  cup serving, the swap will also keep you more full and you'll get a metabolism boost from the added fiber. 16. Snack on pistachios instead of macadamia nuts. Believe it or not, you get the same amount of calories from 35 pistachios (100 calories) as you would from only five macadamia nuts. 17. Chow down on kale chips rather than potato chips. This is my favorite 'don't knock it 'till you try it' swap. Kale chips are so easy to make at home, and you can spice them up with a little grated parmesan or chili powder. Plus, they're a mere fraction of the calories of potato chips, but with the  same crunch factor we crave so often. 18. Add seltzer and some fruit slices to your cocktail instead of soda or fruit juice. One cup of soda or fruit juice can pack on as much as 140 calories. Instead, use seltzer and fruit slices. The fruit provides valuable phytochemicals, such as flavonoids and anthocyanins, which help to combat cancer and stave off the aging process.

## 2017-06-19 NOTE — Assessment & Plan Note (Signed)
Patient with known history of carbuncles in for uncles underneath armpits bilaterally.  Exposure to husband with MRSA as well as recently child had outbreak.  Since patient's pustules are draining well, and decreasing in size, she will continue with wound care and we'll forego antibiotics at this time.  She will let us know if they do not continue to improve and/or she develops any new fever chills and new symptoms.

## 2017-06-19 NOTE — Assessment & Plan Note (Signed)
A1c is 6.3 now down from 6.7 8 months ago.  Needs recheck 4 months from prior -3 months from now  Diet controlled; go to dietitian in near future on the 25th of this month  We discussed dietary and lifestyle changes that need to occur to get better control of her diseases

## 2017-06-19 NOTE — Assessment & Plan Note (Signed)
Patient declines medications at this time.  She says she will work on lifestyle changes to improve this condition

## 2017-06-19 NOTE — Assessment & Plan Note (Signed)
Loperamide 4 mg at first onset of diarrhea, may repeat 2 mg tabs after each subsequent episode of diarrhea 2.  Advised brat diet, no dairy or fried or fatty foods for the next several days.  We can consider 5 map diet in the future.  She can follow-up with her gastroenterologist if needed in the future

## 2017-06-19 NOTE — Progress Notes (Signed)
Impression and Recommendations:    1. Diet-controlled type 2 diabetes mellitus (HCC)   2. Obesity (BMI 30-39.9)   3. Tick bite of back, initial encounter   4. Chronic diarrhea of unknown origin   5. Acute folliculitis- likely due to MRSA, they are draining   6. Personal history of MRSA (methicillin resistant Staphylococcus aureus)   7. Contact dermatitis and eczema- due to tape\ Band-Aid   8. Fibromyalgia     Please continue to use warm compresses on the pustules.  I would use a warm compress at least 5-6 times per day please let them drain as much as possible.  If they stopped draining or the redness around them grows not from the Band-Aid but from the infection, please let me know we can always put you on doxycycline or a like antibiotic.  In 3 months from now please make a follow-up appointment and come in for a A1c and office visit with me.  We will also be monitoring your weight as well.  Your goal for next office visit:   Use Lose IT app--> it's free.    Let shoot for 10 pounds in 3 months. You, goal will be to be 209.  Drink 1/2 wt in ounces per water per day- so for you, it would be roughly 110 oz/d  Please make sure you follow up for that dietitian appointment in the near future- the 25th as well.  Pt was in the office today for 40+ minutes, with over 50% time spent in face to face counseling of patients various medical conditions, treatment plans of those medical conditions including medicine management and lifestyle modification, strategies to improve health and well being; and in coordination of care. SEE ABOVE AND BELOW FOR DETAILS  Diet-controlled type 2 diabetes mellitus (HCC) A1c is 6.3 now down from 6.7 8 months ago.  Needs recheck 4 months from prior -3 months from now  Diet controlled; go to dietitian in near future on the 25th of this month  We discussed dietary and lifestyle changes that need to occur to get better control of her  diseases      Obesity (BMI 30-39.9) Goal is 10 pound weight loss in 3 months when I see her.  She will download the lose it app onto her phone and track everything she eats.  Follow-up with dietitian later this month  Personal history of MRSA (methicillin resistant Staphylococcus aureus) Patient with known history of carbuncles in for uncles underneath armpits bilaterally.  Exposure to husband with MRSA as well as recently child had outbreak.  Since patient's pustules are draining well, and decreasing in size, she will continue with wound care and we'll forego antibiotics at this time.  She will let us know if they do not continue to improve and/or she develops any new fever chills and new symptoms.  Fibromyalgia Patient declines medications at this time.  She says she will work on lifestyle changes to improve this condition  Chronic diarrhea  Loperamide 4 mg at first onset of diarrhea, may repeat 2 mg tabs after each subsequent episode of diarrhea 2.  Advised brat diet, no dairy or fried or fatty foods for the next several days.  We can consider 5 map diet in the future.  She can follow-up with her gastroenterologist if needed in the future   The patient was counseled, risk factors were discussed, anticipatory guidance given.   Gross side effects, risk and benefits, and alternatives of medications and  treatment plan in general discussed with patient.  Patient is aware that all medications have potential side effects and we are unable to predict every side effect or drug-drug interaction that may occur.   Patient will call with any questions prior to using medication if they have concerns.  Expresses verbal understanding and consents to current therapy and treatment regimen.  No barriers to understanding were identified.  Red flag symptoms and signs discussed in detail.  Patient expressed understanding regarding what to do in case of emergency\urgent symptoms  Please see AVS  handed out to patient at the end of our visit for further patient instructions/ counseling done pertaining to today's office visit.   Return in about 3 months (around 09/19/2017) for Diabetes, weight loss, healthy habits- lose it  APP.     Note: This document was prepared using Dragon voice recognition software and may include unintentional dictation errors.  Samantha [name] 11:12 AM --------------------------------------------------------------------------------------------------------------------------------------------------------------------------------------------------------------------------------------------    Subjective:    CC:  Chief Complaint  Patient presents with  . Follow-up  . Diabetes  . Fibromyalgia    HPI: Samantha [name] is a 36 y.o. female who presents to [place] [facility] today for Follow-up of her multiple chronic conditions, and has some acute concerns of rash and tick bite.  Patient is a diet-controlled diabetic.  Approximate 3 weeks ago she had an A1c of 6.3.  It was much improved from 6.7 prior which was 8 months prior.    -  Obesity:  Pt has gained 8 lbs in past 3 wks.   she tells me she doesn't know how she did because she barely ate anything as her chronic diarrhea has been flared up for the past week or 2.   Going to dietician 25th of this month for-->  To discuss wt loss and Diet-controlled DM.     Bit by tick- about 3 days ago.   Thursday she mowed yard and was working outside.  The next day- they noticed tick - small one on L side of back.  Likely was on her body less then 36-48 hours.   Also complains of a new rash, broke out with 2 pustules underneath and under left side of breast 2 pustules as well.   Started this weekend as well.  Gauze and medical tape causing rash as well.   Infection seems to be localizing and draining.    They were hard before and now they have been draining for several days.   Other daughter recently had  cellulitis- Now resolved.  Husband with MRSA and patient herself did have MRSA infection in the past.   Been over 6 mo since last ABX usage.    Chronic diarrhea.  This is been an issue for patient for many years now.  She had seen Dr. Carol [name] of gastroenterology several years ago in the past.  She had a colonoscopy and EGD.  I do not have these records and we will obtain them.  He had her on some medicine in the past which was too expensive for her.  She does not recall what her diagnosis was however.   Patient tells me for her chronic fibromyalgia, she is not taking the Neurontin at all and says it doesn't help so she doesn't want to take a medicine for it.  She is not taking tramadol either.  She says usually her exercise can improve her from myalgia symptoms occasionally flares up.    Wt Readings  from Last 3 Encounters:  06/19/17 219 lb (99.3 kg)  05/30/17 211 lb (95.7 kg)  05/10/17 219 lb (99.3 kg)   BP Readings from Last 3 Encounters:  06/19/17 130/85  05/30/17 123/67  05/10/17 111/73   Pulse Readings from Last 3 Encounters:  06/19/17 82  05/30/17 74  05/10/17 78   BMI Readings from Last 3 Encounters:  06/19/17 33.55 kg/m  05/30/17 30.71 kg/m  05/10/17 33.55 kg/m     Patient Care Team    Relationship Specialty Notifications Start End  [name], DO PCP - General Family Medicine  09/29/16   Pedro Earls, MD Attending Physician Family Medicine  09/29/16   Samantha Ada, MD Consulting Physician Gastroenterology  06/19/17      Patient Active Problem List   Diagnosis Date Noted  . Diet-controlled type 2 diabetes mellitus ([place]) 12/29/2015    Priority: High  . Obesity (BMI 30-39.9) 02/17/2014    Priority: High  . Personal history of MRSA (methicillin resistant Staphylococcus aureus) 06/19/2017    Priority: Medium  . Chronic diarrhea  06/19/2017    Priority: Low  . Obstructive sleep apnea syndrome, moderate 11/24/2014    Priority: Low  . Acute  folliculitis- likely due to MRSA, they are draining 06/19/2017  . Contact dermatitis and eczema- due to tape\ Band-Aid 06/19/2017  . Abdominal pain, LUQ 05/02/2017  . Abdominal pain, LLQ 05/02/2017  . Hx of diarrhea 05/02/2017  . Environmental and seasonal allergies 09/29/2016  . GERD (gastroesophageal reflux disease) 09/29/2016  . Bipolar 1 disorder - Anxiety 09/28/2016  . Fibromyalgia 08/11/2016  . OSA on CPAP 12/08/2014  . Fatty liver 09/05/2014  . S/P abdominal hysterectomy 08/15/2014  . Bladder dysfunction 04/30/2014  . Nephrolithiasis 03/04/2014  . Agenesis, corpus callosum ([place]) 02/17/2014  . GAD (generalized anxiety disorder) 02/17/2014  . History of blood transfusion 02/17/2014    Past Medical history, Surgical history, Family history, Social history, Allergies and Medications have been entered into the medical record, reviewed and changed as needed.    No outpatient prescriptions have been marked as taking for the 06/19/17 encounter (Office Visit) with [name], DO.    Allergies:  Allergies  Allergen Reactions  . Sulfa Antibiotics Anaphylaxis and Swelling  . Sulfamethoxazole-Trimethoprim Anaphylaxis    Tongue swelling also  . Duloxetine Hcl Other (See Comments)    Weight gain, feels "crazy"  . Penicillins Rash    Has patient had a PCN reaction causing immediate rash, facial/tongue/throat swelling, SOB or lightheadedness with hypotension: Yes Has patient had a PCN reaction causing severe rash involving mucus membranes or skin necrosis: No Has patient had a PCN reaction that required hospitalization No Has patient had a PCN reaction occurring within the last 10 years: No If all of the above answers are "NO", then may proceed with Cephalosporin use.      Review of Systems: General:   Denies fever, chills, unexplained weight loss.  Optho/Auditory:   Denies visual changes, blurred vision/LOV Respiratory:   Denies wheeze, DOE more than baseline levels.   Cardiovascular:   Denies chest pain, palpitations, new onset peripheral edema  Gastrointestinal:   Denies nausea, vomiting, abd pain, + diarhea .  Genitourinary: Denies dysuria, freq/ urgency Endocrine:     Denies hot or cold intolerance, polyuria, polydipsia. Musculoskeletal:   Denies unexplained myalgias, joint swelling, unexplained arthralgias, gait problems.  Skin:  Denies suspicious lesions, + rash left upper abdomen and breast Neurological:     Denies dizziness, unexplained weakness, numbness  Psychiatric/Behavioral:  Denies mood changes, suicidal or homicidal ideations, hallucinations    Objective:   Blood pressure 130/85, pulse 82, height 5' 7.75" (1.721 m), weight 219 lb (99.3 kg), last menstrual period 08/05/2014. Body mass index is 33.55 kg/m. General:  Well Developed, well nourished, appropriate for stated age.  Neuro:  Alert and oriented,  extra-ocular muscles intact  HEENT:  Normocephalic, atraumatic, neck supple, no carotid bruits appreciated  Skin:  no gross rash, warm, pink. 2 erythematous draining pustules left upper anterior abdomen and 2 on inferior aspect left breast.  There is minimal fluctuance appreciated.  surrounding erythema due to bandage.  No evidence of tick bite on left aspect of back is present.   Cardiac:  RRR, S1 S2 Respiratory:  ECTA B/L and A/P, Not using accessory muscles, speaking in full sentences- unlabored. Vascular:  Ext warm, no cyanosis apprec.; cap RF less 2 sec. Psych:  No HI/SI, judgement and insight good, Euthymic mood. Full Affect.

## 2017-06-19 NOTE — Assessment & Plan Note (Signed)
Goal is 10 pound weight loss in 3 months when I see her.  She will download the lose it app onto her phone and track everything she eats.  Follow-up with dietitian later this month

## 2017-06-22 ENCOUNTER — Other Ambulatory Visit: Payer: Self-pay

## 2017-06-22 ENCOUNTER — Telehealth: Payer: Self-pay | Admitting: Family Medicine

## 2017-06-22 DIAGNOSIS — Z22322 Carrier or suspected carrier of Methicillin resistant Staphylococcus aureus: Secondary | ICD-10-CM

## 2017-06-22 MED ORDER — MUPIROCIN 2 % EX OINT: 1.0000 "application " | TOPICAL_OINTMENT | Freq: Two times a day (BID) | CUTANEOUS | 0 refills | Status: DC

## 2017-06-22 NOTE — Telephone Encounter (Signed)
Patient was told to call if she had "red rings" around the sight. She noticed them today and wants to know what to do.

## 2017-06-22 NOTE — Telephone Encounter (Addendum)
Patient called states that she has noticed a red ring around the areas under her arm.  Per Dr. Raliegh Scarlet sent in mupirocin ointment due to history of MRSA and to use warm compresse.  Areas are already draining and per Dr. Raliegh Scarlet not antibiotics are needed at this time.  MPulliam, CMA/RT(R)

## 2017-06-22 NOTE — Telephone Encounter (Signed)
Please advise Thanks. MPulliam, CMA/RT(R)  

## 2017-06-22 NOTE — Telephone Encounter (Signed)
Call mupirocin ointment in for her.   I recommend that she follow p to be seen in the ffice next ailble  for further evaluation

## 2017-06-23 NOTE — Telephone Encounter (Signed)
Spoke to patient's husband - appointment made for Monday with Mina Marble, NP. MPulliam, CMA/RT(R)

## 2017-06-26 ENCOUNTER — Ambulatory Visit: Payer: PPO | Admitting: Adult Health

## 2017-06-28 ENCOUNTER — Encounter: Payer: PPO | Attending: Adult Health | Admitting: Registered"

## 2017-06-28 ENCOUNTER — Telehealth: Payer: Self-pay

## 2017-06-28 ENCOUNTER — Encounter: Payer: Self-pay | Admitting: Registered"

## 2017-06-28 DIAGNOSIS — K760 Fatty (change of) liver, not elsewhere classified: Secondary | ICD-10-CM | POA: Diagnosis not present

## 2017-06-28 DIAGNOSIS — K529 Noninfective gastroenteritis and colitis, unspecified: Secondary | ICD-10-CM

## 2017-06-28 DIAGNOSIS — Z713 Dietary counseling and surveillance: Secondary | ICD-10-CM | POA: Diagnosis not present

## 2017-06-28 NOTE — Patient Instructions (Addendum)
Start taking Vitamin D supplement, can check with doctor if you want, 2000 iu should be good  Ask doctor if an eppipen would be a good idea because of onion allergy.  Sodium: consider having smaller portions of foods that are high in sodium such as pizza, mac and cheese, canned food, etc. Add more vegetables and fruits (bananas, a small orange, a few strawberries) to your meals.  FODMAPs can try to avoid the following foods for 3 weeks Garlic Most fruit Gluten. Can eat sourdough bread that doesn't have yeast in the ingredients Lactose

## 2017-06-28 NOTE — Progress Notes (Signed)
Medical Nutrition Therapy:  Appt start time: 1400 end time:  1500.   Assessment:  Primary concerns today: Tired all the time, bloated, achy joints. Has frequent stools sometimes diarrhea. Pt states she cannot see any pattern with type of food, salad, meat, healthy, unhealthy. GI did endoscopy, colonoscopy and pt states she was told everything looks healthy.   Pt also states she has diet-controlled diabetes (per chart A1c 6.3 05/2017; 6.7 -9 mo ago).   Per chart pt has Vit D insufficiency (26.6 ng/mL)  Pt states her husband is T1DM. Pt reports he lost his leg in Dec (RD not sure if due to T1DM). Pt states they had only 1 car for awhile and pt had to take husband to work and would drive through [employer] for breakfast. Pt states she doesn't need to do this anymore and doesn't eat out often now.  Pt stated that her hair is falling out. RD asked about menstruation history and suspects potential PCOS or Thyroid issues, however RD did not have time to fully assess, due to focused this visit on what appear to be IBS symptoms.  PCOS symptom review: Menstruation: always has been irregular, heavy to the point she missed school regularly. Birth Control:  No, saw doctor in middle school, no treatment. Fertility: Pt states it took 10 yrs to get pregnant. After 2 children elected to have hysterectomy. Acne: Body hair: Thinning scalp hair: recently yes Weight changes (midsection?): Acanthosis nigricans: Carb Cravings:  Preferred Learning Style:  No preference indicated   Learning Readiness:   Ready  MEDICATIONS: reviewed   DIETARY INTAKE:  Pt states avoids foods that affect breathing (potential allergy):  onions & Dr Malachi Bonds, especially fountain Pt states she avoids beans due to gas  24-hr recall:  B ( AM): none OR bacon, goldfish OR [employer] grilled chicken biscuit Snk ( AM): none  L ( PM): if breakfast no lunch OR sandwiches OR frozen pizza OR dollar menu at [employer] (  PM): none OR handful of chips D ( PM): chicken, turnip greens, green beans, broccoli, macaroni sometimes OR hotdogs Snk ( PM): coke 5 oz Beverages: water, coke, sweet tea or OJ once every 2 weeks  Usual physical activity: ADLs  Estimated energy needs: 1800 calories 200 g carbohydrates 135 g protein 50 g fat  Progress Towards Goal(s):  In progress.   Nutritional Diagnosis:  NI-5.8.5 Inadeqate fiber intake As related to lack of fruits, vegetables, and whole grains.  As evidenced by dietary recall.    Intervention:  Nutrition Education. Discussed potential onion allergy and EpiPen precaution. Discussed IBS and FODMAPs. Discussed bloating symptoms and potential sodium relationship. Discussed food and vitamins which influence energy levels.   Plan:  Start taking Vitamin D supplement, can check with doctor if you want, 2000 iu should be good   Ask doctor if an EpiPen would be a good idea because of onion allergy.   Sodium: consider having smaller portions of foods that are high in sodium such as pizza, mac and cheese, canned food, etc. Add more vegetables and fruits (bananas, a small orange, a few strawberries) to your meals.   FODMAPs can try to avoid the following foods for 3 weeks  Garlic  Most fruit  Gluten. Sourdough bread without yeast in the ingredients okay  Lactose  Teaching Method Utilized:  Visual Auditory  Handouts given during visit include:  none  Barriers to learning/adherence to lifestyle change: none  Demonstrated degree of understanding via:  Teach Back  Monitoring/Evaluation:  Dietary intake, exercise, and body weight in 3 week(s).

## 2017-06-28 NOTE — Telephone Encounter (Signed)
Patient called requesting results of her CT abd/pelvis - I went over with the patient notes that Mina Marble, NP had on the CT scan.  Patient states that she is still having abd pain and diarrhea, I advised patient that per Katy's note she needs to call her GI doctor and make an appointment.  Offered to make the patient an appointment with Dr Raliegh Scarlet for this week and the patient declined.  Patient states that she will call back if she decides to make appointment.  MPulliam, CMA/RT(R)

## 2017-07-04 ENCOUNTER — Encounter: Payer: Self-pay | Admitting: Family Medicine

## 2017-07-17 ENCOUNTER — Encounter: Payer: Self-pay | Admitting: Nurse Practitioner

## 2017-07-17 ENCOUNTER — Ambulatory Visit (INDEPENDENT_AMBULATORY_CARE_PROVIDER_SITE_OTHER): Payer: PPO | Admitting: Nurse Practitioner

## 2017-07-17 VITALS — BP 128/78 | HR 70 | Ht 69.0 in | Wt 221.2 lb

## 2017-07-17 DIAGNOSIS — K529 Noninfective gastroenteritis and colitis, unspecified: Secondary | ICD-10-CM

## 2017-07-17 DIAGNOSIS — E7889 Other lipoprotein metabolism disorders: Secondary | ICD-10-CM | POA: Diagnosis not present

## 2017-07-17 DIAGNOSIS — E8889 Other specified metabolic disorders: Secondary | ICD-10-CM

## 2017-07-17 DIAGNOSIS — R1011 Right upper quadrant pain: Secondary | ICD-10-CM

## 2017-07-17 MED ORDER — DIPHENOXYLATE-ATROPINE 2.5-0.025 MG PO TABS
1.0000 | ORAL_TABLET | Freq: Two times a day (BID) | ORAL | 2 refills | Status: DC
Start: 2017-07-17 — End: 2018-04-24

## 2017-07-17 NOTE — Progress Notes (Addendum)
aqw     HPI:  Patient is a 36 year old female with DM2, fibromyalgia, chronic diarrhea. She is referred by PCP Dr. Raliegh Scarlet for abdominal pain and diarrhea. She was evaluated by Dr. Benson Norway 2 years ago, underwent upper endoscopy and colonoscopy which were apparently unremarkable. She describes 10-15 loose bowel movements a day Occasionally has nocturnal stooling.  Diarrhea has no relationship to eating nor what she eats including dairy or sweets. She took Imodium when symptoms first started but had no relief after completing a box. Now she just deals with the diarrhea. In addition to that diarrhea patient complains of nearly constant right upper quadrant pain. The pain does not radiate through to her back, it is unrelated to eating or position. Patient's husband had similar symptoms, turned out to be his gallbladder. Patient concerned she has gallbladder disease.  CT scan of the abdomen and pelvis late June for abdominal pain, diarrhea, and weight loss did not show any acute abnormalities. No bowel wall thickening. Liver was prominent with steatosis, no focal liver lesions. She had some nonobstructing calculi in each kidney. CBC and chemistry profile in late June were unrevealing. States she recently lost 40 pounds without trying but regained 11 pounds. I couldn't correlate her weight loss with the weights in Epic other than the 11 pound gain since December.   Past Medical History:  Diagnosis Date  . Anemia   . Anxiety   . Depression    rx presc but not taken  . Diabetes mellitus without complication ([place])   . GERD (gastroesophageal reflux disease)    no meds  . Headache(784.0)    tension and with anxiety hx  . History of kidney stones   . MVP (mitral valve prolapse)    as a child  . MVP (mitral valve prolapse)    echo 10  . Pneumonia    hx  . Seizures ([place])    non epileptic events per epilepsy monitoring unit  . Seizures ([place])    last seizure July 2015- does not convulse but has a sleep  effect  . Sinus infection    recent on antibiotic  . Sleep apnea    has cpap does not use     Past Surgical History:  Procedure Laterality Date  . ABDOMINAL HYSTERECTOMY N/A 08/15/2014   Procedure: HYSTERECTOMY ABDOMINAL WITH CYSTOSCOPY;  Surgeon: Sanjuana Kava, MD;  Location: [facility];  Service: Gynecology;  Laterality: N/A;  . CESAREAN SECTION N/A   . SHOULDER ARTHROSCOPY WITH SUBACROMIAL DECOMPRESSION AND OPEN ROTATOR C Left 11/27/2015   Procedure: LEFT SHOULDER ARTHROSCOPY WITH SUBACROMIAL DECOMPRESSION, MINI OPEN ROTATOR CUFF REPAIR;  Surgeon: Netta Cedars, MD;  Location: [facility];  Service: Orthopedics;  Laterality: Left;  . TUBAL LIGATION     Family History  Problem Relation Age of Onset  . Diabetes Mother   . Hypertension Mother   . Healthy Father   . Breast cancer Maternal Grandmother   . Cancer Maternal Grandfather        brain, lung, liver  . Heart attack Paternal Grandfather    Social History  Substance Use Topics  . Smoking status: Never Smoker  . Smokeless tobacco: Never Used  . Alcohol use No   No current outpatient prescriptions on file.   No current facility-administered medications for this visit.    Allergies  Allergen Reactions  . Sulfa Antibiotics Anaphylaxis and Swelling  . Sulfamethoxazole-Trimethoprim Anaphylaxis    Tongue swelling also  . Duloxetine Hcl Other (See Comments)  Weight gain, feels "crazy"  . Penicillins Rash    Has patient had a PCN reaction causing immediate rash, facial/tongue/throat swelling, SOB or lightheadedness with hypotension: Yes Has patient had a PCN reaction causing severe rash involving mucus membranes or skin necrosis: No Has patient had a PCN reaction that required hospitalization No Has patient had a PCN reaction occurring within the last 10 years: No If all of the above answers are "NO", then may proceed with Cephalosporin use.      Review of Systems: Positive for allergies, anxiety, back pain, fatigue, and  itching All other systems reviewed and negative except where noted in HPI.    Physical Exam: BP 128/78 (BP Location: Left Arm, Patient Position: Sitting, Cuff Size: Large)   Pulse 70   Ht 5\' 9"  (1.753 m)   Wt 221 lb 3.2 oz (100.3 kg)   LMP 08/05/2014   BMI 32.67 kg/m  Constitutional:  Obese white female in no acute distress. Psychiatric: Plesant. Normal mood and affect. Behavior is normal. EENT: Pupils normal.  Conjunctivae are normal. No scleral icterus. Neck supple.  Cardiovascular: Normal rate, regular rhythm. No edema Pulmonary/chest: Effort normal and breath sounds normal. No wheezing, rales or rhonchi. Abdominal: Soft, nondistended. Nontender. Bowel sounds active throughout. There are no masses palpable. No hepatomegaly. Lymphadenopathy: No cervical adenopathy noted. Neurological: Alert and oriented to person place and time. Skin: Skin is warm and dry. No rashes noted.   ASSESSMENT AND PLAN:  44. 36 year old female with chronic nonradiating right upper quadrant pain and loose stools. CTscan in June was unrevealing.  -EGD and colonoscopy by Dr. Benson Norway 2 years ago for same symptoms. Requesting records.  -Trial of lomotil BID -Patient really feels like her right upper quadrant pain are gallbladder related. No cholelithiasis or gallbladder wall thickening on CT scan in June. I will obtain a RUQ ultrasound.  -follow up in 4-6 weeks with me or Dr. Hilarie Fredrickson.   2. Steatosis. Normal liver chemistries. -discussed need for weight loss -triglycerides are normal in 90's -she doesn't consume ETOH -maintain good glucose   3. Diet controlled DM  Tye Savoy, NP  07/17/2017, 10:18 AM   Addendum: Reviewed and agree with initial management. Pyrtle, Lajuan Lines, MD   [name], DO   Addendum. Received endoscopy reports from Dr. Ulyses Amor office. EGD done 10/12/15 for evaluation of diarrhea. Esophagus was normal. There was initial thought of Barrett's esophagus but the folds stretched out  the Z line was normal. Normal mucosa in the entire esophagus. Biopsies were taken for evaluation of celiac disease. Duodenal biopsies were negative.   Colonoscopy 10/12/15 for chronic diarrhea. Pediatric scope was used , extent of exam to the ileocecal valve. Extremely difficult colonoscopy due to restricted mobility of the colon and significant looping as well as the patient's body habitus. The bowel prep was excellent. Normal mucosa was found the entire colon. Random biopsies were taken. Random biopsies were unremarkable.   Patient was followed by Dr. Benson Norway Oct 2016 to Jan 2017 for evaluation and tx of diarrhea. She was tried on Imodium and Colestipol.

## 2017-07-17 NOTE — Patient Instructions (Addendum)
If you are age 38 or older, your body mass index should be between 23-30. Your Body mass index is 32.67 kg/m. If this is out of the aforementioned range listed, please consider follow up with your Primary Care Provider.  If you are age 37 or younger, your body mass index should be between 19-25. Your Body mass index is 32.67 kg/m. If this is out of the aformentioned range listed, please consider follow up with your Primary Care Provider.   We have sent the following medications to your pharmacy for you to pick up at your convenience: Lomotil - HOLD if constipated.  You have been scheduled for an abdominal ultrasound at [facility] (1st floor of hospital) on 07/20/17 at 800 a.m.Marland Kitchen Please arrive 15 minutes prior to your appointment for registration. Make certain not to have anything to eat or drink after midnight the day before prior to your appointment. Should you need to reschedule your appointment, please contact radiology at [phone]. This test typically takes about 30 minutes to perform.   Work on weight loss.  You have been given literature on Fatty Liver. Fatty Liver Fatty liver, also called hepatic steatosis or steatohepatitis, is a condition in which too much fat has built up in your liver cells. The liver removes harmful substances from your bloodstream. It produces fluids your body needs. It also helps your body use and store energy from the food you eat. In many cases, fatty liver does not cause symptoms or problems. It is often diagnosed when tests are being done for other reasons. However, over time, fatty liver can cause inflammation that may lead to more serious liver problems, such as scarring of the liver (cirrhosis). What are the causes? Causes of fatty liver may include:  Drinking too much alcohol.  Poor nutrition.  Obesity.  Cushing syndrome.  Diabetes.  Hyperlipidemia.  Pregnancy.  Certain drugs.  Poisons.  Some viral infections.  What  increases the risk? You may be more likely to develop fatty liver if you:  Abuse alcohol.  Are pregnant.  Are overweight.  Have diabetes.  Have hepatitis.  Have a high triglyceride level.  What are the signs or symptoms? Fatty liver often does not cause any symptoms. In cases where symptoms develop, they can include:  Fatigue.  Weakness.  Weight loss.  Confusion.  Abdominal pain.  Yellowing of your skin and the white parts of your eyes (jaundice).  Nausea and vomiting.  How is this diagnosed? Fatty liver may be diagnosed by:  Physical exam and medical history.  Blood tests.  Imaging tests, such as an ultrasound, CT scan, or MRI.  Liver biopsy. A small sample of liver tissue is removed using a needle. The sample is then looked at under a microscope.  How is this treated? Fatty liver is often caused by other health conditions. Treatment for fatty liver may involve medicines and lifestyle changes to manage conditions such as:  Alcoholism.  High cholesterol.  Diabetes.  Being overweight or obese.  Follow these instructions at home:  Eat a healthy diet as directed by your health care provider.  Exercise regularly. This can help you lose weight and control your cholesterol and diabetes. Talk to your health care provider about an exercise plan and which activities are best for you.  Do not drink alcohol.  Take medicines only as directed by your health care provider. Contact a health care provider if: You have difficulty controlling your:  Blood sugar.  Cholesterol.  Alcohol consumption.  Get help right away if:  You have abdominal pain.  You have jaundice.  You have nausea and vomiting. This information is not intended to replace advice given to you by your health care provider. Make sure you discuss any questions you have with your health care provider. Document Released: 01/06/2006 Document Revised: 04/28/2016 Document Reviewed:  04/02/2014 Elsevier Interactive Patient Education  2018 [place].  Follow up appointment on 07/26/17 at 930 a.m with Dr. Hilarie Fredrickson.  Thank you for choosing me and [facility].   Tye Savoy, NP

## 2017-07-20 ENCOUNTER — Ambulatory Visit (HOSPITAL_COMMUNITY)
Admission: RE | Admit: 2017-07-20 | Discharge: 2017-07-20 | Disposition: A | Discharge status: Home / Self Care | Payer: PPO | Source: Ambulatory Visit | Attending: Nurse Practitioner | Admitting: Nurse Practitioner

## 2017-07-20 ENCOUNTER — Ambulatory Visit: Payer: PPO | Admitting: Registered"

## 2017-07-20 DIAGNOSIS — R1011 Right upper quadrant pain: Secondary | ICD-10-CM | POA: Diagnosis not present

## 2017-07-20 DIAGNOSIS — R932 Abnormal findings on diagnostic imaging of liver and biliary tract: Secondary | ICD-10-CM | POA: Diagnosis not present

## 2017-07-20 DIAGNOSIS — K529 Noninfective gastroenteritis and colitis, unspecified: Secondary | ICD-10-CM | POA: Insufficient documentation

## 2017-07-20 DIAGNOSIS — E7889 Other lipoprotein metabolism disorders: Secondary | ICD-10-CM

## 2017-07-20 DIAGNOSIS — E8889 Other specified metabolic disorders: Secondary | ICD-10-CM

## 2017-07-21 ENCOUNTER — Telehealth: Payer: Self-pay | Admitting: Nurse Practitioner

## 2017-07-21 NOTE — Telephone Encounter (Signed)
Advised the u/s results have not been reviewed by her provider. Advised no gallstones were seen.

## 2017-07-26 ENCOUNTER — Ambulatory Visit: Payer: PPO | Admitting: Internal Medicine

## 2017-08-01 ENCOUNTER — Ambulatory Visit: Payer: PPO | Admitting: Family Medicine

## 2017-08-01 ENCOUNTER — Encounter: Payer: Self-pay | Admitting: Family Medicine

## 2017-08-01 VITALS — BP 129/83 | HR 79 | Ht 69.0 in | Wt 224.1 lb

## 2017-08-01 DIAGNOSIS — E119 Type 2 diabetes mellitus without complications: Secondary | ICD-10-CM

## 2017-08-01 DIAGNOSIS — Z87898 Personal history of other specified conditions: Secondary | ICD-10-CM

## 2017-08-01 DIAGNOSIS — E669 Obesity, unspecified: Secondary | ICD-10-CM

## 2017-08-01 DIAGNOSIS — K219 Gastro-esophageal reflux disease without esophagitis: Secondary | ICD-10-CM

## 2017-08-01 NOTE — Patient Instructions (Signed)
The Low FODMAP Diet (FODMAP=Fermentable Oligo-Di-Monosaccharides and Polyols) FODMAPs are carbohydrates (sugars) that are found in foods. Not all carbohydrates are considered FODMAPs. The FODMAPs in the diet are: . Fructose (fruits, honey, high fructose corn syrup (HFCS), etc) . Lactose (dairy) . Fructans (wheat, onion, garlic, etc)(fructans are also known as inulin) . Galactans (beans, lentils, legumes such as soy, etc) . Polyols (sweeteners containing sorbitol, mannitol, xylitol, maltitol, stone fruits such as avocado, apricots, cherries, nectarines, peaches, plums, etc) FODMAPs are osmotic (means they pull water into the intestinal tract), may not be digested or absorbed well and could be fermented upon by bacteria in the intestinal tract when eaten in excess. Symptoms of gas, bloating, cramping and/or diarrhea may occur in those who could be sensitive to the effects of FODMAPs. A low FODMAP diet may help reduce symptoms, which will limit foods high in fructose, lactose, fructans, galactans and polyols. The low FODMAP diet is often used in those with irritable bowel syndrome (IBS). The diet also has potential use in those with similar symptoms arising from other digestive disorders such as inflammatory bowel disease. This diet will also limit fiber as some high fiber foods have also high amounts of FODMAPs. (Fiber is a component of complex carbohydrates that the body cannot digest, found in plant based foods such as beans, fruits, vegetables, whole grains, etc)   2 Low FODMAP Food Choices Food Group Foods to Eat Foods to [employer], Eggs beef, chicken, canned tuna, eggs, egg whites, fish, lamb, pork, shellfish, [place], cold cuts foods made with high FODMAP fruit sauces or with HFCS Dairy lactose free dairy, small amounts of: cream cheese, half and half, hard cheeses (cheddar, colby, parmesan, swiss), mozzarella, sherbet buttermilk, chocolate, cottage cheese,  ice cream, creamy/cheesy sauces, milk (from cow, sheep or goat), sweetened condensed milk, evaporated milk, soft cheeses (brie, ricotta), sour cream, whipped cream, yogurt Meat, NonDairy  Alternatives almond milk, rice milk, rice milk ice cream, nuts, nut butters, seeds coconut milk, coconut cream, beans, black eyed peas, hummus, lentils, pistachios, soy products Grains wheat free grains/wheat free flours (gluten free grains are wheat free): bagels, breads, hot/cold cereals (corn flakes, cheerios, cream of rice, grits, oats, etc), crackers, noodles, pastas, quinoa, pancakes, pretzels, rice, tapioca, tortillas, waffles chicory root, inulin, grains with HFCS or made from wheat (terms for wheat: einkorn, emmer, kamut, spelt), wheat flours (terms for wheat flour: bromated, durum, enriched, farina, graham, semolina, white flours), flour tortillas, rye Fruits bananas, berries, cantaloupe, grapes, grapefruit, honeydew, kiwi, [place], lemon, lime, mandarin, orange, passion fruit, pineapple, rhubarb, tangerine avocado, apples, applesauce, apricots, dates, canned fruit, cherries, dried fruits, figs, guava, lychee, mango, nectarines, pears, papaya, peaches, plums, prunes, persimmon, watermelon Vegetables bamboo shoots, bell peppers, bok choy, cucumbers, carrots, celery, corn, eggplant, lettuce, leafy greens pumpkin, potatoes, squash, yams, (butternut, winter), tomatoes, zucchini artichokes, asparagus, beets, leeks, broccoli, brussel sprouts, cabbage, cauliflower, fennel, green beans, mushrooms, okra, snow peas, summer squash Desserts any made with allowed foods any with HFCS or made with foods to limit Beverages low FODMAP fruit/vegetable juices (limit to  cup at a time), coffee, tea any with HFCS high FODMAP fruit/vegetable juices, fortified wines (sherry, port) Seasonings, Condiments most spices and herbs, homemade broth, butter, chives, flaxseed, garlic flavored  oil, garlic powder, olives, margarine, mayonnaise, onion powder, olive oil, pepper, salt, sugar, maple syrup without HFCS, mustard, low FODMAP salad dressings, soy sauce, marinara sauce (small amounts), vinegar, balsamic vinegar HFCS, agave, chutneys, coconut, garlic, honey, jams, jellies, molasses, onions, pickle, relish, high  FODMAP fruit/vegetable sauces, salad dressings made with high FODMAPs, artificial sweeteners: sorbitol, mannitol, isomalt, xylitol (cough drops, gums, mints)  3 Tips for a low FODMAP diet: . Follow the diet for 6 weeks. After this, add high FODMAP foods one at a time back into the diet in small amounts to identify foods that could be "triggers" to your symptoms. Limit foods that trigger your symptoms. . Read food labels. Avoid foods made with high FODMAPs such as high FODMAP fruits, HFCS, honey, inulin, wheat, soy, etc. However, a food could be an overall low FODMAP food if a high FODMAP food listed as the last ingredient. . Buy gluten free grains as they are wheat free. However, you do not need to follow a 100% gluten free diet as the focus is on FODMAPs, not gluten. Look for gluten free grains made with low FODMAPs, such as potato, quinoa, rice or corn. Avoid gluten free grains made with high FODMAPs. . Limit serving sizes for low FODMAP fruits/vegetables and high fiber/low FODMAP foods such as quinoa to a  cup per meal ( cup=size of a tennis ball) if you have symptoms after eating these foods. The symptoms could be related to eating large amounts of low FODMAPs or fiber all at once. Low FODMAP Meals and Snack Ideas 1. gluten free waffle with walnuts, blueberries, maple syrup without HFCS 2. eggs scrambled with spinach, bell peppers and cheddar cheese 3. oatmeal topped with sliced banana, almonds and brown sugar 4. fruit smoothie blended with lactose free vanilla yogurt and strawberries 5. rice pasta with chicken, tomatoes, spinach topped with pesto  sauce 6. chicken salad mixed with chicken, lettuce, bell peppers, cucumbers, tomatoes, balsamic vinegar salad dressing 7. [place] wrap with gluten free tortilla, sliced [place], lettuce, tomato, slice of cheddar cheese slice, mayonnaise, mustard 8. ham and swiss cheese sandwich on gluten free bread, with mayonnaise, mustard 9. quesadilla with corn or gluten free tortilla and cheddar cheese 10. beef and vegetable stew (made with homemade broth, beef, allowed vegetables)

## 2017-08-01 NOTE — Progress Notes (Signed)
Impression and Recommendations:    1. Diet-controlled type 2 diabetes mellitus (HCC)   2. Obesity (BMI 30-39.9)   3. Gastroesophageal reflux disease, esophagitis presence not specified   4. Hx of diarrhea    The patient was counseled, risk factors were discussed, anticipatory guidance given.  Gross side effects, risk and benefits, and alternatives of medications and treatment plan in general discussed with patient.  Patient is aware that all medications have potential side effects and we are unable to predict every side effect or drug-drug interaction that may occur.   Patient will call with any questions prior to using medication if they have concerns.  Expresses verbal understanding and consents to current therapy and treatment regimen.  No barriers to understanding were identified.  Red flag symptoms and signs discussed in detail.  Patient expressed understanding regarding what to do in case of emergency\urgent symptoms  Please see AVS handed out to patient at the end of our visit for further patient instructions/ counseling done pertaining to today's office visit.   Return for f/up as preveiously discussed- please try to come fasting if possible.  We will obtain blood work.     Note: This document was prepared using Dragon voice recognition software and may include unintentional dictation errors.  Mlissa Tamayo 10:05 AM --------------------------------------------------------------------------------------------------------------------------------------------------------------------------------------------------------------------------------------------    Subjective:    CC:  Chief Complaint  Patient presents with  . Follow-up    HPI: KARLE DESROSIER is a 36 y.o. female who presents to [place] at [facility] today for issues as discussed below.  - Pt here for f/up after Last office visit we sent her to nutritionist, and discussed her using the lose it  app and shooting for 10 pound weight loss in 3 months.   Patient went for one appointment and did not have very good interaction.  She was called in told that may be another provider would be better suited for her.  Patient did not follow-up again  - [place] the beach this past [place]- walked alot.      Wt Readings from Last 3 Encounters:  08/24/17 223 lb 8 oz (101.4 kg)  08/01/17 224 lb 1.6 oz (101.7 kg)  07/17/17 221 lb 3.2 oz (100.3 kg)   BP Readings from Last 3 Encounters:  08/24/17 134/88  08/01/17 129/83  07/17/17 128/78   Pulse Readings from Last 3 Encounters:  08/24/17 80  08/01/17 79  07/17/17 70   BMI Readings from Last 3 Encounters:  08/24/17 33.01 kg/m  08/01/17 33.09 kg/m  07/17/17 32.67 kg/m     Patient Care Team    Relationship Specialty Notifications Start End  [name], DO PCP - General Family Medicine  09/29/16   Pedro Earls, MD Attending Physician Family Medicine  09/29/16   Carol Ada, MD Consulting Physician Gastroenterology  06/19/17      Patient Active Problem List   Diagnosis Date Noted  . Diet-controlled type 2 diabetes mellitus ([place]) 12/29/2015    Priority: High  . Obesity (BMI 30-39.9) 02/17/2014    Priority: High  . Acute folliculitis- likely due to MRSA, they are draining 06/19/2017    Priority: Medium  . Personal history of MRSA (methicillin resistant Staphylococcus aureus) 06/19/2017    Priority: Medium  . Environmental and seasonal allergies 09/29/2016    Priority: Medium  . GERD (gastroesophageal reflux disease) 09/29/2016    Priority: Medium  . Bipolar 1 disorder - Anxiety 09/28/2016    Priority:  Medium  . Fatty liver 09/05/2014    Priority: Medium  . Chronic diarrhea  06/19/2017    Priority: Low  . Contact dermatitis and eczema- due to tape\ Band-Aid 06/19/2017    Priority: Low  . Fibromyalgia 08/11/2016    Priority: Low  . OSA on CPAP 12/08/2014    Priority: Low  . Obstructive sleep apnea syndrome, moderate  11/24/2014    Priority: Low  . S/P abdominal hysterectomy 08/15/2014    Priority: Low  . Abdominal pain, LUQ 05/02/2017  . Abdominal pain, LLQ 05/02/2017  . Hx of diarrhea 05/02/2017  . Bladder dysfunction 04/30/2014  . Nephrolithiasis 03/04/2014  . Agenesis, corpus callosum ([place]) 02/17/2014  . GAD (generalized anxiety disorder) 02/17/2014  . History of blood transfusion 02/17/2014    Past Medical history, Surgical history, Family history, Social history, Allergies and Medications have been entered into the medical record, reviewed and changed as needed.    No outpatient prescriptions have been marked as taking for the 08/01/17 encounter (Office Visit) with [name], DO.    Allergies:  Allergies  Allergen Reactions  . Sulfa Antibiotics Anaphylaxis and Swelling  . Sulfamethoxazole-Trimethoprim Anaphylaxis    Tongue swelling also  . Duloxetine Hcl Other (See Comments)    Weight gain, feels "crazy"  . Penicillins Rash    Has patient had a PCN reaction causing immediate rash, facial/tongue/throat swelling, SOB or lightheadedness with hypotension: Yes Has patient had a PCN reaction causing severe rash involving mucus membranes or skin necrosis: No Has patient had a PCN reaction that required hospitalization No Has patient had a PCN reaction occurring within the last 10 years: No If all of the above answers are "NO", then may proceed with Cephalosporin use.      Review of Systems: General:   Denies fever, chills, unexplained weight loss.  Optho/Auditory:   Denies visual changes, blurred vision/LOV Respiratory:   Denies wheeze, DOE more than baseline levels.  Cardiovascular:   Denies chest pain, palpitations, new onset peripheral edema  Gastrointestinal:   Denies nausea, vomiting, diarrhea, abd pain.  Genitourinary: Denies dysuria, freq/ urgency, flank pain or discharge from genitals.  Endocrine:     Denies hot or cold intolerance, polyuria, polydipsia. Musculoskeletal:    Denies unexplained myalgias, joint swelling, unexplained arthralgias, gait problems.  Skin:  Denies new onset rash, suspicious lesions Neurological:     Denies dizziness, unexplained weakness, numbness  Psychiatric/Behavioral:   Denies mood changes, suicidal or homicidal ideations, hallucinations    Objective:   Blood pressure 129/83, pulse 79, height 5\' 9"  (1.753 m), weight 224 lb 1.6 oz (101.7 kg), last menstrual period 08/05/2014. Body mass index is 33.09 kg/m. General:  Well Developed, well nourished, appropriate for stated age.  Neuro:  Alert and oriented,  extra-ocular muscles intact  HEENT:  Normocephalic, atraumatic, neck supple, no carotid bruits appreciated  Skin:  no gross rash, warm, pink. Cardiac:  RRR, S1 S2 Respiratory:  ECTA B/L and A/P, Not using accessory muscles, speaking in full sentences- unlabored. Vascular:  Ext warm, no cyanosis apprec.; cap RF less 2 sec. Psych:  No HI/SI, judgement and insight good, Euthymic mood. Full Affect.

## 2017-08-21 DIAGNOSIS — R0989 Other specified symptoms and signs involving the circulatory and respiratory systems: Secondary | ICD-10-CM | POA: Diagnosis not present

## 2017-08-21 DIAGNOSIS — R002 Palpitations: Secondary | ICD-10-CM | POA: Diagnosis not present

## 2017-08-24 ENCOUNTER — Encounter: Payer: Self-pay | Admitting: Family Medicine

## 2017-08-24 ENCOUNTER — Ambulatory Visit (INDEPENDENT_AMBULATORY_CARE_PROVIDER_SITE_OTHER): Payer: PPO | Admitting: Family Medicine

## 2017-08-24 VITALS — BP 134/88 | HR 80 | Ht 69.0 in | Wt 223.5 lb

## 2017-08-24 DIAGNOSIS — M79605 Pain in left leg: Secondary | ICD-10-CM

## 2017-08-24 DIAGNOSIS — M79604 Pain in right leg: Secondary | ICD-10-CM

## 2017-08-24 DIAGNOSIS — M546 Pain in thoracic spine: Secondary | ICD-10-CM

## 2017-08-24 NOTE — Progress Notes (Signed)
Pt here for an acute care OV today   Impression and Recommendations:    1. acute onset Lower extremity pain, bilateral   2. Acute midline thoracic back pain     acute onset Lower extremity pain, bilateral - Plan: MR Lumbar Spine Wo Contrast, MR Thoracic Spine Wo Contrast  Acute midline thoracic back pain - Plan: MR Lumbar Spine Wo Contrast, MR Thoracic Spine Wo Contrast  No problem-specific Assessment & Plan notes found for this encounter.   The patient was counseled, risk factors were discussed, anticipatory guidance given.  New Prescriptions   No medications on file    Discontinued Medications   No medications on file    Modified Medications   No medications on file     No orders of the defined types were placed in this encounter.   Orders Placed This Encounter  Procedures  . MR Lumbar Spine Wo Contrast  . MR Thoracic Spine Wo Contrast     Gross side effects, risk and benefits, and alternatives of medications and treatment plan in general discussed with patient.  Patient is aware that all medications have potential side effects and we are unable to predict every side effect or drug-drug interaction that may occur.   Patient will call with any questions prior to using medication if they have concerns.  Expresses verbal understanding and consents to current therapy and treatment regimen.  No barriers to understanding were identified.  Red flag symptoms and signs discussed in detail.  Patient expressed understanding regarding what to do in case of emergency\urgent symptoms  Please see AVS handed out to patient at the end of our visit for further patient instructions/ counseling done pertaining to today's office visit.   Return in about 2 weeks (around 09/07/2017), or if symptoms worsen or fail to improve.     Note: This document was prepared using Dragon voice recognition software and may include unintentional dictation errors.  [name] 10:38  AM --------------------------------------------------------------------------------------------------------------------------------------------------------------------------------------------------------------------------------------------    Subjective:    CC:  Chief Complaint  Patient presents with  . Leg Pain    pain bilat legs from thighs down since last night     HPI: BUFORD GAYLER is a 36 y.o. female who presents to [place] at [facility] today for issues as discussed below.  Last night she lying in bed - she is never experienced this before but all of a sudden she started feeling cramps and restless leg type symptoms.  She feels like her upper thighs are swollen.  She feels like her thighs distally all the way down to her toes, she feels like her legs are just going to explode because they're so huge.  Pain is a 10 out of 10.   Feels Tight and painful-- "just hurts".   No numbness/ tingling, but they both feel very sensitive to light touch.    This pain has occurred prior with FM flare-up of her pain but now it is localized to b/l LExt.   10/10 pain this am as well, she showered, was able to walk etc.  New back pain with these sx in the T4-6 paravert region.    No problems updated.   Wt Readings from Last 3 Encounters:  08/24/17 223 lb 8 oz (101.4 kg)  08/01/17 224 lb 1.6 oz (101.7 kg)  07/17/17 221 lb 3.2 oz (100.3 kg)   BP Readings from Last 3 Encounters:  08/24/17 134/88  08/01/17 129/83  07/17/17 128/78  BMI Readings from Last 3 Encounters:  08/24/17 33.01 kg/m  08/01/17 33.09 kg/m  07/17/17 32.67 kg/m     Patient Care Team    Relationship Specialty Notifications Start End  [name], DO PCP - General Family Medicine  09/29/16   Pedro Earls, MD Attending Physician Family Medicine  09/29/16   Carol Ada, MD Consulting Physician Gastroenterology  06/19/17   Marcial Pacas, [place] Physician Neurology  08/24/17      Patient  Active Problem List   Diagnosis Date Noted  . Diet-controlled type 2 diabetes mellitus ([place]) 12/29/2015    Priority: High  . Obesity (BMI 30-39.9) 02/17/2014    Priority: High  . Acute folliculitis- likely due to MRSA, they are draining 06/19/2017    Priority: Medium  . Personal history of MRSA (methicillin resistant Staphylococcus aureus) 06/19/2017    Priority: Medium  . Environmental and seasonal allergies 09/29/2016    Priority: Medium  . GERD (gastroesophageal reflux disease) 09/29/2016    Priority: Medium  . Bipolar 1 disorder - Anxiety 09/28/2016    Priority: Medium  . Fatty liver 09/05/2014    Priority: Medium  . Chronic diarrhea  06/19/2017    Priority: Low  . Contact dermatitis and eczema- due to tape\ Band-Aid 06/19/2017    Priority: Low  . Fibromyalgia 08/11/2016    Priority: Low  . OSA on CPAP 12/08/2014    Priority: Low  . Obstructive sleep apnea syndrome, moderate 11/24/2014    Priority: Low  . S/P abdominal hysterectomy 08/15/2014    Priority: Low  . Abdominal pain, LUQ 05/02/2017  . Abdominal pain, LLQ 05/02/2017  . Hx of diarrhea 05/02/2017  . Bladder dysfunction 04/30/2014  . Nephrolithiasis 03/04/2014  . Agenesis, corpus callosum ([place]) 02/17/2014  . GAD (generalized anxiety disorder) 02/17/2014  . History of blood transfusion 02/17/2014    Past Medical history, Surgical history, Family history, Social history, Allergies and Medications have been entered into the medical record, reviewed and changed as needed.    Current Meds  Medication Sig  . diphenoxylate-atropine (LOMOTIL) 2.5-0.025 MG tablet Take 1 tablet by mouth 2 (two) times daily. Hold if constipated (Patient taking differently: Take 1 tablet by mouth 2 (two) times daily. Hold if constipated)    Allergies:  Allergies  Allergen Reactions  . Sulfa Antibiotics Anaphylaxis and Swelling  . Sulfamethoxazole-Trimethoprim Anaphylaxis    Tongue swelling also  . Duloxetine Hcl Other (See  Comments)    Weight gain, feels "crazy"  . Penicillins Rash    Has patient had a PCN reaction causing immediate rash, facial/tongue/throat swelling, SOB or lightheadedness with hypotension: Yes Has patient had a PCN reaction causing severe rash involving mucus membranes or skin necrosis: No Has patient had a PCN reaction that required hospitalization No Has patient had a PCN reaction occurring within the last 10 years: No If all of the above answers are "NO", then may proceed with Cephalosporin use.      Review of Systems: General:   Denies fever, chills, unexplained weight loss.  Optho/Auditory:   Denies visual changes, blurred vision/LOV Respiratory:   Denies wheeze, DOE more than baseline levels.  Cardiovascular:   Denies chest pain, palpitations, new onset peripheral edema  Gastrointestinal:   Denies nausea, vomiting, diarrhea, abd pain.  Genitourinary: Denies dysuria, freq/ urgency, flank pain or discharge from genitals.  Endocrine:     Denies hot or cold intolerance, polyuria, polydipsia. Musculoskeletal:   Denies unexplained myalgias, joint swelling, unexplained arthralgias, gait problems.  Skin:  Denies new onset rash, suspicious lesions Neurological:     Denies dizziness, unexplained weakness, numbness  Psychiatric/Behavioral:   Denies mood changes, suicidal or homicidal ideations, hallucinations    Objective:   Blood pressure 134/88, pulse 80, height 5\' 9"  (1.753 m), weight 223 lb 8 oz (101.4 kg), last menstrual period 08/05/2014. Body mass index is 33.01 kg/m. General:  Well Developed, well nourished, appropriate for stated age.  Neuro:  Alert and oriented,  extra-ocular muscles intact  HEENT:  Normocephalic, atraumatic, neck supple Skin:  no gross rash, warm, pink. Cardiac:  RRR, S1 S2 Respiratory:  ECTA B/L and A/P, Not using accessory muscles, speaking in full sentences- unlabored. Vascular:  Ext warm, no cyanosis apprec.; cap RF less 2 sec. Psych:  No HI/SI,  judgement and insight good, Euthymic mood. Full Affect.

## 2017-08-24 NOTE — Patient Instructions (Addendum)
--- So we will send you to 4 MRI acutely to rule out any neuropathic reason for these symptoms.  Additionally, we will see you back to reassess and decide next steps which could include rheumatology consult.  Please make a follow-up appointment with your neurologist to discuss these sx as well.    Please see this website for more information about fibromyalgia:  [email or web address]  What Is Fibromyalgia?  Fibromyalgia is a common disorder that involves widespread pain, tenderness, fatigue, and other symptoms. It's not a form of arthritis, but like arthritis, it can interfere with a person's ability to perform everyday activities. An estimated 5 million American adults have fibromyalgia. Between 80 and 90 percent of people with fibromyalgia are women, but men and children can also have this condition.  More About Fibromyalgia  For more information about fibromyalgia, visit the [facility] of Arthritis and Musculoskeletal and Skin Diseases Web site.   What's the [employer] regarding alternative treatments for Fibromyalgia?  What do we know about the effectiveness of complementary health approaches for fibromyalgia?  Although some studies of tai chi, yoga, mindfulness meditation, and biofeedback for fibromyalgia have had promising results, the evidence is too limited to allow definite conclusions to be reached about whether these approaches are helpful.  It's uncertain whether acupuncture is helpful for fibromyalgia pain.  Vitamin D supplements may reduce pain in people with fibromyalgia who are deficient in this vitamin.  Some preliminary research on transcranial magnetic stimulation (TMS) for fibromyalgia symptoms has had promising results.  What do we know about the safety of complementary health approaches for fibromyalgia?   The mind and body approaches discussed here generally have good safety records. However, some may need to be adapted to  make them safe and comfortable for people with fibromyalgia.  Some of the natural products that have been studied for fibromyalgia may have side effects or interact with medications.  Headaches sometimes occur as a side effect of TMS for fibromyalgia. TMS and other procedures involving magnets may not be safe for people who have metal implants or medical devices such as pacemakers in their bodies.   What the [place] for Fibromyalgia: Mind and Body Practices:  -Acupuncture -Biofeedback -Guided Imagery -Massage Therapy -Meditative Movement Practices (Tai Chi, Marcelino Duster, and Yoga) -Mindfulness Meditation -Other Mind and Body Practices  Natural Products:  -It has been suggested that deficiencies in vitamin D might worsen fibromyalgia symptoms. In one study of women with fibromyalgia who had low vitamin D levels, 20 weeks of vitamin D supplementation led to a reduction in pain.  -Researchers are investigating whether low magnesium levels contribute to fibromyalgia and if magnesium supplements might help to reduce symptoms.  -Other natural products that have been studied for fibromyalgia include dietary supplements such as soy, S-adenosyl-L-methionine (SAMe), and creatine, and topical products containing capsaicin (the substance that gives chili peppers their heat). --> There's not enough evidence to determine whether these products are helpful.  - And remember, "natural" doesn't necessarily mean "safe." Natural products can have side effects, and some may interact with medications. Even vitamins and minerals can be harmful if taken in excessive amounts.  Make sure he work with your doctor and monitor for adequate levels.   Other Complementary Approaches to Explore: -Balneotherapy -Homeopathy -Magnetic Therapies -Reiki   NCCIH-Funded Research  Recent NCCIH-sponsored studies have been investigating various aspects of complementary and  integrative interventions for fibromyalgia, including:  -The effectiveness of traditional [place] medicine for treating fibromyalgia -How  tai chi compares to aerobic exercise as an adjunctive treatment for fibromyalgia symptoms -Whether brain responses to placebos differ between people with fibromyalgia and healthy people.  More to Consider: Be aware that some complementary health approaches-particularly dietary supplements-may interact with conventional medical treatments. To learn more about using dietary supplements, see the [facility], The fact sheet Using Dietary Supplements Wisely and the [facility] interactive module Understanding Drug-Supplement Interactions.  If you're considering a practitioner-provided complementary health approach such as acupuncture, check with your insurer to see if the services will be covered, and ask a trusted source (like your fibromyalgia health care provider or a nearby hospital or medical school) to recommend a practitioner.  Tell all your health care providers about any complementary or integrative health approaches you use. Give them a full picture of what you do to manage your health. This will help ensure coordinated and safe care.   For More Information:  1) [place]: The [place] provides information on [facility] and complementary and integrative health approaches, including publications and searches of [employer] of scientific and medical literature. The Clearinghouse does not provide medical advice, treatment recommendations, or referrals to practitioners. Toll-free in the [place].:   [phone] TTY (for deaf and hard-of-hearing callers):   [phone] Web site:   VoipGurus.fr E-mail:   info_0 .SouthExposed.es (link sends e-mail)  2) [place] (NIAMS):  The mission of NIAMS is to support research into the causes, treatment, and prevention of arthritis and musculoskeletal and  skin diseases; the training of basic and clinical scientists to carry out this research; and the dissemination of information on research progress in these diseases. Toll-free in the [place].:   1-877-22-NIAMS Web site:   Www.niams.SouthExposed.es  3) PubMed A service of the [employer] of Medicine, PubMed contains publication information and (in most cases) brief summaries of articles from scientific and medical journals. For guidance from [facility] on using PubMed, see How To Find Information About Complementary Health Approaches on PubMed. Web site:   https://www.davis.org/  4) MedlinePlus To provide resources that help answer health questions, MedlinePlus (a service of the [employer] of Medicine) brings together authoritative information from the [employer] as well as other Government agencies and [employer]. Web site:   Www.medlineplus.gov  ----------------------------------------------------------------------------------------------------------------------  Certified Practitioners in Acupuncture and [place] certified   Lenetta Quaker, [place] Gender - Female [address]  [place] [email or web address] [phone] Gender - Female  [place] [phone]   Isabella Stalling [phone] Gender - Female [place] [address]   [place], [place] [phone] Gender - Female [address] [place] [address] [email or web address]    Scheryl Marten [phone] Gender - Female [place] [phone] [email or web address] [phone] Gender - Female [place] [email or web address] [phone] Gender - Female [address]  Google Map [place] [address]   [place], [place] [phone] Gender - Female [address] Ignacia Marvel  [place] [address]   Harlene Salts [phone] Gender - Female [address] [place]  [place] [address]   Carr, Holly B. [phone] Gender - Female [address]  [place] [address]

## 2017-08-28 ENCOUNTER — Encounter: Payer: Self-pay | Admitting: Family Medicine

## 2017-08-28 ENCOUNTER — Ambulatory Visit: Payer: PPO | Admitting: Family Medicine

## 2017-08-28 ENCOUNTER — Ambulatory Visit (INDEPENDENT_AMBULATORY_CARE_PROVIDER_SITE_OTHER): Payer: PPO | Admitting: Family Medicine

## 2017-08-28 VITALS — BP 121/82 | HR 86 | Temp 98.5°F | Ht 69.0 in | Wt 226.3 lb

## 2017-08-28 DIAGNOSIS — J3089 Other allergic rhinitis: Secondary | ICD-10-CM | POA: Diagnosis not present

## 2017-08-28 DIAGNOSIS — H1010 Acute atopic conjunctivitis, unspecified eye: Secondary | ICD-10-CM | POA: Diagnosis not present

## 2017-08-28 DIAGNOSIS — J029 Acute pharyngitis, unspecified: Secondary | ICD-10-CM

## 2017-08-28 DIAGNOSIS — J309 Allergic rhinitis, unspecified: Secondary | ICD-10-CM

## 2017-08-28 LAB — POCT RAPID STREP A (OFFICE): Rapid Strep A Screen: NEGATIVE

## 2017-08-28 NOTE — Patient Instructions (Signed)
Use Milta Deiters med or [place] sinus rinses twice daily.    Especially rinse after you are outside to prolonged exposure to the environment, then do 1 spray Flonase each nostril twice daily after you rinse.  May use Allegra, Zyrtec, Claritin or the like daily.  Generic is fine.  If the eyes are itchy and watery, use Naphcon-A over-the-counter medicine  If you have a cold and this is not allergies, it will last 5-7 days and then slowly improve.  If it gets worse after 7-10 days, please let us know.

## 2017-08-28 NOTE — Progress Notes (Signed)
Pt here for an acute care OV today   Impression and Recommendations:    1. Pharyngitis, unspecified etiology   2. Environmental and seasonal allergies   3. Allergic conjunctivitis and rhinitis sx, unspecified laterality     Pharyngitis, unspecified etiology - Plan: POCT rapid strep A--> Negative today  Use Milta Deiters med or Ayr sinus rinses twice daily.    Especially rinse after you are outside to prolonged exposure to the environment, then Samantha [name] 1 spray Flonase each nostril twice daily after you rinse.  May use Allegra, Zyrtec, Claritin or the like daily.  Generic is fine.  If the eyes are itchy and watery, use Naphcon-A over-the-counter medicine  If you have a cold and this is not allergies, it will last 5-7 days and then slowly improve.  If it gets worse after 7-10 days, please let us know.  Environmental and seasonal allergies  Allergic conjunctivitis and rhinitis sx, unspecified laterality  The patient was counseled, risk factors were discussed, anticipatory guidance given.   Orders Placed This Encounter  Procedures  . POCT rapid strep A   Gross side effects, risk and benefits, and alternatives of medications and treatment plan in general discussed with patient.  Patient is aware that all medications have potential side effects and we are unable to predict every side effect or drug-drug interaction that may occur.   Patient will call with any questions prior to using medication if they have concerns.  Expresses verbal understanding and consents to current therapy and treatment regimen.  No barriers to understanding were identified.  Red flag symptoms and signs discussed in detail.  Patient expressed understanding regarding what to Samantha [name] in case of emergency\urgent symptoms  Please see AVS handed out to patient at the end of our visit for further patient instructions/ counseling done pertaining to today's office visit.   Return if symptoms worsen or fail to improve.     Note:  This document was prepared using Dragon voice recognition software and may include unintentional dictation errors.  Samantha [name] 12:06 PM --------------------------------------------------------------------------------------------------------------------------------------------------------------------------------------------------------------------------------------------    Subjective:    CC:  Chief Complaint  Patient presents with  . Sore Throat    x 1 day    HPI: Samantha [name] is a 36 y.o. female who presents to [place] [facility] today for issues as discussed below.  Patient presents with symptoms of right-sided sore throat that she woke up with today.  She feels maybe she has a little bit swollen lymph nodes in her right side of her neck.  She is worried she might have an infection as she has young children.  She's been having little bit of mild headache on and off.  Her eyes were a little bit watery and itchy the past couple days.  She has not taken anything for the symptoms.    - No sick contacts but her kids are back in school and have a lot of different exposures per patient.   Wt Readings from Last 3 Encounters:  08/28/17 226 lb 4.8 oz (102.6 kg)  08/24/17 223 lb 8 oz (101.4 kg)  08/01/17 224 lb 1.6 oz (101.7 kg)   BP Readings from Last 3 Encounters:  08/28/17 121/82  08/24/17 134/88  08/01/17 129/83   BMI Readings from Last 3 Encounters:  08/28/17 33.42 kg/m  08/24/17 33.01 kg/m  08/01/17 33.09 kg/m     Patient Care Team    Relationship Specialty Notifications Start End  Samantha Dance, Samantha [name]  PCP - General Family Medicine  09/29/16   Pedro Earls, MD Attending Physician Family Medicine  09/29/16   Carol Ada, MD Consulting Physician Gastroenterology  06/19/17   Marcial Pacas, [place] Physician Neurology  08/24/17      Patient Active Problem List   Diagnosis Date Noted  . Diet-controlled type 2 diabetes mellitus ([place])  12/29/2015    Priority: High  . Obesity (BMI 30-39.9) 02/17/2014    Priority: High  . Acute folliculitis- likely due to MRSA, they are draining 06/19/2017    Priority: Medium  . Personal history of MRSA (methicillin resistant Staphylococcus aureus) 06/19/2017    Priority: Medium  . Environmental and seasonal allergies 09/29/2016    Priority: Medium  . GERD (gastroesophageal reflux disease) 09/29/2016    Priority: Medium  . Bipolar 1 disorder - Anxiety 09/28/2016    Priority: Medium  . Fatty liver 09/05/2014    Priority: Medium  . Chronic diarrhea  06/19/2017    Priority: Low  . Contact dermatitis and eczema- due to tape\ Band-Aid 06/19/2017    Priority: Low  . Fibromyalgia 08/11/2016    Priority: Low  . OSA on CPAP 12/08/2014    Priority: Low  . Obstructive sleep apnea syndrome, moderate 11/24/2014    Priority: Low  . S/P abdominal hysterectomy 08/15/2014    Priority: Low  . Abdominal pain, LUQ 05/02/2017  . Abdominal pain, LLQ 05/02/2017  . Hx of diarrhea 05/02/2017  . Bladder dysfunction 04/30/2014  . Nephrolithiasis 03/04/2014  . Agenesis, corpus callosum ([place]) 02/17/2014  . GAD (generalized anxiety disorder) 02/17/2014  . History of blood transfusion 02/17/2014    Past Medical history, Surgical history, Family history, Social history, Allergies and Medications have been entered into the medical record, reviewed and changed as needed.    Current Meds  Medication Sig  . diphenoxylate-atropine (LOMOTIL) 2.5-0.025 MG tablet Take 1 tablet by mouth 2 (two) times daily. Hold if constipated (Patient taking differently: Take 1 tablet by mouth 2 (two) times daily. Hold if constipated)    Allergies:  Allergies  Allergen Reactions  . Sulfa Antibiotics Anaphylaxis and Swelling  . Sulfamethoxazole-Trimethoprim Anaphylaxis    Tongue swelling also  . Duloxetine Hcl Other (See Comments)    Weight gain, feels "crazy"  . Penicillins Rash    Has patient had a PCN reaction  causing immediate rash, facial/tongue/throat swelling, SOB or lightheadedness with hypotension: Yes Has patient had a PCN reaction causing severe rash involving mucus membranes or skin necrosis: No Has patient had a PCN reaction that required hospitalization No Has patient had a PCN reaction occurring within the last 10 years: No If all of the above answers are "NO", then may proceed with Cephalosporin use.      Review of Systems: General:   Denies fever, chills, unexplained weight loss.  Optho/Auditory:   Denies visual changes, blurred vision/LOV Respiratory:   Denies wheeze, DOE more than baseline levels.  Cardiovascular:   Denies chest pain, palpitations, new onset peripheral edema  Gastrointestinal:   Denies nausea, vomiting, diarrhea, abd pain.  Genitourinary: Denies dysuria, freq/ urgency, flank pain or discharge from genitals.  Endocrine:     Denies hot or cold intolerance, polyuria, polydipsia. Musculoskeletal:   Denies unexplained myalgias, joint swelling, unexplained arthralgias, gait problems.  Skin:  Denies new onset rash, suspicious lesions Neurological:     Denies dizziness, unexplained weakness, numbness  Psychiatric/Behavioral:   Denies mood changes, suicidal or homicidal ideations, hallucinations    Objective:   Blood pressure 121/82,  pulse 86, temperature 98.5 F (36.9 C), height 5\' 9"  (1.753 m), weight 226 lb 4.8 oz (102.6 kg), last menstrual period 08/05/2014. Body mass index is 33.42 kg/m. General:  Well Developed, well nourished, appropriate for stated age.  Neuro:  Alert and oriented,  extra-ocular muscles intact  HEENT:  Normocephalic, atraumatic, neck supple, no right-sided lymphadenopathy, mild anterior cervical left-sided lymphadenopathy.  Nontender.  Mild erythema right oropharynx.  Nares, mild edema and erythema.  No tenderness to palpation of her sinuses, conjunctiva clear.   Skin:  no gross rash, warm, pink. Cardiac:  RRR, S1 S2 Respiratory:  ECTA B/L  and A/P, Not using accessory muscles, speaking in full sentences- unlabored. Vascular:  Ext warm, no cyanosis apprec.; cap RF less 2 sec. Psych:  No HI/SI, judgement and insight good, Euthymic mood. Full Affect.

## 2017-08-30 ENCOUNTER — Other Ambulatory Visit: Payer: Self-pay | Admitting: Family Medicine

## 2017-08-30 ENCOUNTER — Telehealth: Payer: Self-pay | Admitting: Family Medicine

## 2017-08-30 NOTE — Telephone Encounter (Signed)
[name] from [employer] ask that MA/ Nurse call their office with Precert# for scheduled MRI on Samantha [name]. --call [name] at [phone]

## 2017-08-30 NOTE — Telephone Encounter (Signed)
Request for auth was faxed I am waiting on approval.  MPulliam, CMA/RT(R)

## 2017-09-01 ENCOUNTER — Telehealth: Payer: Self-pay | Admitting: Family Medicine

## 2017-09-01 ENCOUNTER — Encounter: Payer: Self-pay | Admitting: *Deleted

## 2017-09-01 NOTE — Telephone Encounter (Signed)
Patient's husband called stating that he got a call from [facility] that her MRI may need to be r/s since they never received a PA.

## 2017-09-03 ENCOUNTER — Other Ambulatory Visit: Payer: PPO

## 2017-09-18 ENCOUNTER — Ambulatory Visit: Payer: PPO | Admitting: Internal Medicine

## 2017-09-19 ENCOUNTER — Ambulatory Visit
Admission: RE | Admit: 2017-09-19 | Discharge: 2017-09-19 | Disposition: A | Discharge status: Home / Self Care | Payer: PPO | Source: Ambulatory Visit | Attending: Family Medicine | Admitting: Family Medicine

## 2017-09-19 ENCOUNTER — Ambulatory Visit: Payer: PPO | Admitting: Family Medicine

## 2017-09-19 DIAGNOSIS — M5126 Other intervertebral disc displacement, lumbar region: Secondary | ICD-10-CM | POA: Diagnosis not present

## 2017-09-19 DIAGNOSIS — M79605 Pain in left leg: Principal | ICD-10-CM

## 2017-09-19 DIAGNOSIS — M546 Pain in thoracic spine: Secondary | ICD-10-CM

## 2017-09-19 DIAGNOSIS — M5124 Other intervertebral disc displacement, thoracic region: Secondary | ICD-10-CM | POA: Diagnosis not present

## 2017-09-19 DIAGNOSIS — M79604 Pain in right leg: Secondary | ICD-10-CM

## 2017-09-22 ENCOUNTER — Telehealth: Payer: Self-pay | Admitting: Family Medicine

## 2017-09-22 NOTE — Telephone Encounter (Signed)
Pt left a messaged on office VMB request a call back, while office clsd for lunch on Friday 09/22/17--request PCP call in Rx for pain meds until appt w/Pain Mgt --  I cb to advise will frwd msg to med assistant/ PCP to contact patient on Monday when ffice re-opens. ----Dion Body

## 2017-09-22 NOTE — Telephone Encounter (Signed)
Patient called with results, see result notes. MPulliam, CMA/RT(R)

## 2017-09-22 NOTE — Telephone Encounter (Signed)
Pt called ask for medical assistant to call her w / MRI results--  -glh

## 2017-09-25 ENCOUNTER — Other Ambulatory Visit: Payer: Self-pay

## 2017-09-25 DIAGNOSIS — M5442 Lumbago with sciatica, left side: Secondary | ICD-10-CM

## 2017-09-25 DIAGNOSIS — M5441 Lumbago with sciatica, right side: Secondary | ICD-10-CM

## 2017-09-25 DIAGNOSIS — M797 Fibromyalgia: Secondary | ICD-10-CM

## 2017-09-25 MED ORDER — GABAPENTIN 100 MG PO CAPS: 100.0000 mg | ORAL_CAPSULE | Freq: Three times a day (TID) | ORAL | 0 refills | Status: DC

## 2017-09-25 MED ORDER — AMITRIPTYLINE HCL 25 MG PO TABS
25.0000 mg | ORAL_TABLET | Freq: Every day | ORAL | 0 refills | Status: DC
Start: 2017-09-25 — End: 2017-12-01

## 2017-09-25 NOTE — Telephone Encounter (Signed)
Start Neurontin 100 mg 3 times a day.  Dispense 90 no refills.  Needs office visit within 3 weeks for assessment - Also start Elavil 25 mg 1 by mouth daily at bedtime for help with night pain.  Dispense 30 no refill.  - Please tell patient these are temporary medications to help her with the pain until she is seen by the specialist.  Further management for this condition will be per the specialist once she is seen

## 2017-09-25 NOTE — Telephone Encounter (Signed)
Called and spoke to the patient, per patient pain is in her entire back and into her legs.  Pain is constant pain all the time, with increases in the pain at various time.  Patient also states that at times the pain is causing her difficulties with sleep. MPulliam, CMA/RT(R)

## 2017-09-25 NOTE — Progress Notes (Signed)
Patient requesting medication for pain until she can get in with specialist.  Per Dr. Raliegh Scarlet sent in medications for the patient for short management until she can be get appointment with the specialist. Referral was place for [facility] per patient request to be sent to them.  MPulliam, CMA/RT(R)

## 2017-09-26 ENCOUNTER — Encounter: Payer: Self-pay | Admitting: *Deleted

## 2017-09-26 ENCOUNTER — Emergency Department
Admission: EM | Admit: 2017-09-26 | Discharge: 2017-09-26 | Disposition: A | Discharge status: Home / Self Care | Payer: PPO | Attending: Emergency Medicine | Admitting: Emergency Medicine

## 2017-09-26 ENCOUNTER — Other Ambulatory Visit: Payer: Self-pay

## 2017-09-26 DIAGNOSIS — M5431 Sciatica, right side: Secondary | ICD-10-CM

## 2017-09-26 DIAGNOSIS — M5432 Sciatica, left side: Secondary | ICD-10-CM | POA: Diagnosis not present

## 2017-09-26 DIAGNOSIS — M545 Low back pain: Secondary | ICD-10-CM | POA: Diagnosis present

## 2017-09-26 DIAGNOSIS — G8929 Other chronic pain: Secondary | ICD-10-CM

## 2017-09-26 DIAGNOSIS — M5441 Lumbago with sciatica, right side: Principal | ICD-10-CM

## 2017-09-26 DIAGNOSIS — Z79899 Other long term (current) drug therapy: Secondary | ICD-10-CM | POA: Diagnosis not present

## 2017-09-26 DIAGNOSIS — E119 Type 2 diabetes mellitus without complications: Secondary | ICD-10-CM | POA: Diagnosis not present

## 2017-09-26 DIAGNOSIS — M5442 Lumbago with sciatica, left side: Principal | ICD-10-CM

## 2017-09-26 MED ORDER — OXYCODONE-ACETAMINOPHEN 5-325 MG PO TABS
1.0000 | ORAL_TABLET | Freq: Once | ORAL | Status: AC
  Administered 2017-09-26: 1 via ORAL
  Filled 2017-09-26: qty 1

## 2017-09-26 MED ORDER — OXYCODONE-ACETAMINOPHEN 5-325 MG PO TABS: 1.0000 | ORAL_TABLET | ORAL | 0 refills | Status: DC | PRN

## 2017-09-26 MED ORDER — METHYLPREDNISOLONE SODIUM SUCC 125 MG IJ SOLR
125.0000 mg | Freq: Once | INTRAMUSCULAR | Status: AC
  Administered 2017-09-26: 125 mg via INTRAMUSCULAR
  Filled 2017-09-26: qty 2

## 2017-09-26 MED ORDER — IBUPROFEN 600 MG PO TABS: 600.0000 mg | ORAL_TABLET | Freq: Four times a day (QID) | ORAL | 0 refills | Status: DC | PRN

## 2017-09-26 MED ORDER — PREDNISONE 10 MG PO TABS: ORAL_TABLET | ORAL | 0 refills | Status: DC

## 2017-09-26 MED ORDER — KETOROLAC TROMETHAMINE 30 MG/ML IJ SOLN
30.0000 mg | Freq: Once | INTRAMUSCULAR | Status: AC
  Administered 2017-09-26: 30 mg via INTRAMUSCULAR
  Filled 2017-09-26: qty 1

## 2017-09-26 NOTE — Progress Notes (Signed)
Changed orthopedic referral to [facility] per patient request. MPulliam, CMA/RT(R)

## 2017-09-26 NOTE — ED Triage Notes (Signed)
Pt complains of lumbar pain radiating down bilateral leg, pt helped her daughter when she feel, pain reports back for 2 days, pt denies any other symptoms

## 2017-09-26 NOTE — Progress Notes (Signed)
Referral was put in for the patient, per patient's decision we changed referral to [facility]. MPulliam, CMA/RT(R)

## 2017-09-26 NOTE — ED Notes (Signed)
Patient reports bending forward to pick up daughter, able to ambulate short distance with discomfort, sits up right and can move upper extremities. patient reports pain 10/10

## 2017-09-26 NOTE — Telephone Encounter (Signed)
Spoke to the patient and informed her that medication was sent in for her.  Patient states that Neurontin did not work for her the last time it was given.  Per Dr. Raliegh Scarlet advised patient to try it again, making sure that she takes it every day and if it does not help to try ibuprofen or tylenol during the day and the Elavil at night. MPulliam, CMA/RT(R)

## 2017-09-26 NOTE — ED Notes (Signed)
Patient does not appear to be in any acute distress at time of discharge. Patient ambulatory to lobby with steady gate. Patient denies any comments or concerns regarding discharge.  

## 2017-09-26 NOTE — ED Provider Notes (Signed)
[facility] Emergency Department Provider Note  ____________________________________________  Time seen: Approximately 8:30 AM  I have reviewed the triage vital signs and the nursing notes.   HISTORY  Chief Complaint Back Pain    HPI Samantha [name] is a 36 y.o. female that presents to the emergency department for evaluation of low back pain for 2 days. Patient states that her back "goes out" every year. Pain feels the exact same as it has in the past. She states that she was bending over to pick up her daughter when she felt pain in her low back. Pain radiates down the back of both legs. She has occasional numbness in her toes. No bowel or bladder dysfunction or saddle paresthesias. She has been taking ibuprofen for symptoms, which is not helping. She has not taken anything today. She had an MRI of her back done this month, which did not show anything to explain her pain.  She has a referral for [place] but is waiting for her insurance to approve appointment. She is not having any urinary tract infection symptoms and is not concerned for this.  No fever, shortness of breath, chest pain, nausea, vomiting, abdominal pain, dysuria, urgency, frequency.   Past Medical History:  Diagnosis Date  . Anemia   . Anxiety   . Depression    rx presc but not taken  . Diabetes mellitus without complication ([place])   . Fibromyalgia   . GERD (gastroesophageal reflux disease)    no meds  . Headache(784.0)    tension and with anxiety hx  . Hepatic steatosis   . History of kidney stones   . MVP (mitral valve prolapse)    echo 10  . Pneumonia    hx  . Seizures ([place])    non epileptic events per epilepsy monitoring unit  . Seizures ([place])    last seizure July 2015- does not convulse but has a sleep effect  . Sinus infection    recent on antibiotic  . Sleep apnea    has cpap does not use    Patient Active Problem List   Diagnosis Date Noted  . Acute  folliculitis- likely due to MRSA, they are draining 06/19/2017  . Personal history of MRSA (methicillin resistant Staphylococcus aureus) 06/19/2017  . Chronic diarrhea  06/19/2017  . Contact dermatitis and eczema- due to tape\ Band-Aid 06/19/2017  . Abdominal pain, LUQ 05/02/2017  . Abdominal pain, LLQ 05/02/2017  . Hx of diarrhea 05/02/2017  . Environmental and seasonal allergies 09/29/2016  . GERD (gastroesophageal reflux disease) 09/29/2016  . Bipolar 1 disorder - Anxiety 09/28/2016  . Fibromyalgia 08/11/2016  . Diet-controlled type 2 diabetes mellitus ([place]) 12/29/2015  . OSA on CPAP 12/08/2014  . Obstructive sleep apnea syndrome, moderate 11/24/2014  . Fatty liver 09/05/2014  . S/P abdominal hysterectomy 08/15/2014  . Bladder dysfunction 04/30/2014  . Nephrolithiasis 03/04/2014  . Agenesis, corpus callosum ([place]) 02/17/2014  . GAD (generalized anxiety disorder) 02/17/2014  . History of blood transfusion 02/17/2014  . Obesity (BMI 30-39.9) 02/17/2014    Past Surgical History:  Procedure Laterality Date  . ABDOMINAL HYSTERECTOMY N/A 08/15/2014   Procedure: HYSTERECTOMY ABDOMINAL WITH CYSTOSCOPY;  Surgeon: Sanjuana Kava, MD;  Location: [facility];  Service: Gynecology;  Laterality: N/A;  . CESAREAN SECTION N/A   . SHOULDER ARTHROSCOPY WITH SUBACROMIAL DECOMPRESSION AND OPEN ROTATOR C Left 11/27/2015   Procedure: LEFT SHOULDER ARTHROSCOPY WITH SUBACROMIAL DECOMPRESSION, MINI OPEN ROTATOR CUFF REPAIR;  Surgeon: Netta Cedars, MD;  Location:  [facility];  Service: Orthopedics;  Laterality: Left;  . TUBAL LIGATION      Prior to Admission medications   Medication Sig Start Date End Date Taking? Authorizing Provider  amitriptyline (ELAVIL) 25 MG tablet Take 1 tablet (25 mg total) by mouth at bedtime. 09/25/17   Opalski, Neoma Laming, DO  diphenoxylate-atropine (LOMOTIL) 2.5-0.025 MG tablet Take 1 tablet by mouth 2 (two) times daily. Hold if constipated Patient taking differently: Take 1 tablet by mouth 2  (two) times daily. Hold if constipated 07/17/17   Willia Craze, NP  gabapentin (NEURONTIN) 100 MG capsule Take 1 capsule (100 mg total) by mouth 3 (three) times daily. 09/25/17   Opalski, Neoma Laming, DO  ibuprofen (ADVIL,MOTRIN) 600 MG tablet Take 1 tablet (600 mg total) by mouth every 6 (six) hours as needed. 09/26/17   Laban Emperor, PA-C  oxyCODONE-acetaminophen (ROXICET) 5-325 MG tablet Take 1 tablet by mouth every 4 (four) hours as needed for severe pain. 09/26/17   Laban Emperor, PA-C  predniSONE (DELTASONE) 10 MG tablet Take 6 tablets on day 1, take 5 tablets on day 2, take 4 tablets on day 3, take 3 tablets on day 4, take 2 tablets on day 5, take 1 tablet on day 6 09/26/17   Laban Emperor, PA-C    Allergies Sulfa antibiotics; Sulfamethoxazole-trimethoprim; Duloxetine hcl; and Penicillins  Family History  Problem Relation Age of Onset  . Diabetes Mother   . Hypertension Mother   . Healthy Father   . Breast cancer Maternal Grandmother   . Cancer Maternal Grandfather        brain, lung, liver  . Heart attack Paternal Grandfather     Social History Social History  Substance Use Topics  . Smoking status: Never Smoker  . Smokeless tobacco: Never Used  . Alcohol use No     Review of Systems  Constitutional: No fever/chills Cardiovascular: No chest pain. Respiratory: No SOB. Gastrointestinal: No abdominal pain.  No nausea, no vomiting.  Musculoskeletal: Positive for back pain. Skin: Negative for rash, abrasions, lacerations, ecchymosis. Neurological: Negative for headaches   ____________________________________________   PHYSICAL EXAM:  VITAL SIGNS: ED Triage Vitals  Enc Vitals Group     BP 09/26/17 0807 128/89     Pulse Rate 09/26/17 0807 79     Resp 09/26/17 0807 20     Temp 09/26/17 0807 98.2 F (36.8 C)     Temp Source 09/26/17 0807 Oral     SpO2 09/26/17 0807 100 %     Weight 09/26/17 0805 228 lb (103.4 kg)     Height 09/26/17 0805 5\' 9"  (1.753 m)      Head Circumference --      Peak Flow --      Pain Score 09/26/17 0805 10     Pain Loc --      Pain Edu? --      Excl. in [place]? --      Constitutional: Alert and oriented. Well appearing and in no acute distress. Eyes: Conjunctivae are normal. PERRL. EOMI. Head: Atraumatic. ENT:      Ears:      Nose: No congestion/rhinnorhea.      Mouth/Throat: Mucous membranes are moist.  Neck: No stridor.  Cardiovascular: Normal rate, regular rhythm.  Good peripheral circulation. Symmetric dorsalis pedis pulses. Respiratory: Normal respiratory effort without tachypnea or retractions. Lungs CTAB. Good air entry to the bases with no decreased or absent breath sounds. Gastrointestinal: Bowel sounds 4 quadrants. Soft and nontender to palpation. No guarding  or rigidity. No palpable masses. No distention. Musculoskeletal: Full range of motion to all extremities. No gross deformities appreciated. Tenderness to palpation over right and left SI joint. Positive straight leg raise. Strength 5 out of 5 in lower extremities bilaterally. Neurologic:  Normal speech and language. No gross focal neurologic deficits are appreciated.  Skin:  Skin is warm, dry and intact. No rash noted.   ____________________________________________   LABS (all labs ordered are listed, but only abnormal results are displayed)  Labs Reviewed - No data to display ____________________________________________  EKG   ____________________________________________  RADIOLOGY   No results found.  ____________________________________________    PROCEDURES  Procedure(s) performed:    Procedures    Medications  methylPREDNISolone sodium succinate (SOLU-MEDROL) 125 mg/2 mL injection 125 mg (125 mg Intramuscular Given 09/26/17 0840)  ketorolac (TORADOL) 30 MG/ML injection 30 mg (30 mg Intramuscular Given 09/26/17 0840)  oxyCODONE-acetaminophen (PERCOCET/ROXICET) 5-325 MG per tablet 1 tablet (1 tablet Oral Given 09/26/17  0840)     ____________________________________________   INITIAL IMPRESSION / ASSESSMENT AND PLAN / ED COURSE  Pertinent labs & imaging results that were available during my care of the patient were reviewed by me and considered in my medical decision making (see chart for details).  Review of the [place] CSRS was performed in accordance of the [place] prior to dispensing any controlled drugs.     Patient's diagnosis is consistent with sciatica. Vital signs and exam are reassuring. MRI done last week shows mild lumbar degenerative changes at L3-4 and L5-S1 and mild L5-S1 foraminal stenosis. No bowel or bladder dysfunction or saddle paresthesias. IM soluMedrol, Toradol, oral Percocet was given. Patient will be discharged home with prescriptions for prednisone, ibuprofen, and a short course of Percocet for breakthrough pain. Patient is to follow up with PCP as directed. Patient is given ED precautions to return to the ED for any worsening or new symptoms.     ____________________________________________  FINAL CLINICAL IMPRESSION(S) / ED DIAGNOSES  Final diagnoses:  Bilateral sciatica      NEW MEDICATIONS STARTED DURING THIS VISIT:  Discharge Medication List as of 09/26/2017  9:08 AM    START taking these medications   Details  ibuprofen (ADVIL,MOTRIN) 600 MG tablet Take 1 tablet (600 mg total) by mouth every 6 (six) hours as needed., Starting Tue 09/26/2017, Print    oxyCODONE-acetaminophen (ROXICET) 5-325 MG tablet Take 1 tablet by mouth every 4 (four) hours as needed for severe pain., Starting Tue 09/26/2017, Print    predniSONE (DELTASONE) 10 MG tablet Take 6 tablets on day 1, take 5 tablets on day 2, take 4 tablets on day 3, take 3 tablets on day 4, take 2 tablets on day 5, take 1 tablet on day 6, Print            This chart was dictated using voice recognition software/Dragon. Despite best efforts to proofread, errors can occur which can change the meaning. Any change  was purely unintentional.    Laban Emperor, PA-C 09/26/17 1010    Carrie Mew, MD 09/28/17 2348

## 2017-09-27 ENCOUNTER — Other Ambulatory Visit: Payer: Self-pay

## 2017-09-27 NOTE — Patient Outreach (Signed)
Outreach patient after ED visit on 09/26/17 at [facility].  I spoke with patient and verified PCP is current.  Patient said that she has an appointment with Orthopedic and considered that her follow up.I explained [facility] services and 24 Hour Nurse Advice Line to the patient.  I asked if she would like one of my team members to do a follow up call with her and if so I had a few questions.  She said no not at this time but would be looking for the information in the mail that I told her I would be sending to her in regards to [facility] services.

## 2017-10-03 ENCOUNTER — Ambulatory Visit (INDEPENDENT_AMBULATORY_CARE_PROVIDER_SITE_OTHER): Payer: PPO | Admitting: Orthopedic Surgery

## 2017-10-03 ENCOUNTER — Encounter (INDEPENDENT_AMBULATORY_CARE_PROVIDER_SITE_OTHER): Payer: Self-pay | Admitting: Orthopedic Surgery

## 2017-10-03 DIAGNOSIS — M47816 Spondylosis without myelopathy or radiculopathy, lumbar region: Secondary | ICD-10-CM | POA: Diagnosis not present

## 2017-10-03 NOTE — Progress Notes (Signed)
Office Visit Note   Patient: Samantha [name]           Date of Birth: 03-25-1981           MRN: [id number] Visit Date: 10/03/2017              Requested by: [name], DO [place], [place] [address] PCP: [name], DO  Chief Complaint  Patient presents with  . Lower Back - Pain      HPI: Patient is a 36 year old woman who presents with recurrent lower back pain she states that she gets a flareup of her back pain about once a year.  She recently went to the emergency room MRI scans were obtained of the thoracic and lumbar spine and patient was started on a prednisone Dosepak she has completed this.  Patient states she has been diagnosed with fibromyalgia as well she has used Neurontin in the past she states she is currently not taking her Neurontin and she has completed a prednisone Dosepak.  Assessment & Plan: Visit Diagnoses:  1. Spondylosis without myelopathy or radiculopathy, lumbar region     Plan: Patient was set up with physical therapy for pivot recommended over-the-counter anti-inflammatories and recommended the increased risk of GI upset with the anti-inflammatories.  Discussed that if she has any worsening of her symptoms we could follow-up immediately.  Discussed the importance of a strengthening of the core for back symptoms.  Follow-Up Instructions: Return if symptoms worsen or fail to improve.   Ortho Exam  Patient is alert, oriented, no adenopathy, well-dressed, normal affect, normal respiratory effort. Examination patient has difficulty getting from a sitting to a standing position she has a normal gait.  She has a negative straight leg raise bilaterally and no focal motor weakness in either lower extremity.  Her MRI scan is reviewed of her thoracic and lumbar spine and this shows no acute osseous abnormalities some mild degenerative disc changes at L3-4 through L5-S1 with no canal stenosis no herniated disc.  Imaging: No results  found. No images are attached to the encounter.  Labs: Lab Results  Component Value Date   HGBA1C 6.3 (H) 05/29/2017   HGBA1C 6.7 09/29/2016   HGBA1C (H) 08/26/2010    5.8 (NOTE)                                                                       According to the ADA Clinical Practice Recommendations for 2011, when HbA1c is used as a screening test:   >=6.5%   Diagnostic of Diabetes Mellitus           (if abnormal result  is confirmed)  5.7-6.4%   Increased risk of developing Diabetes Mellitus  References:Diagnosis and Classification of Diabetes Mellitus,Diabetes GDJM,4268,34(HDQQI 1):S62-S69 and Standards of Medical Care in         Diabetes - 2011,Diabetes Care,2011,34  (Suppl 1):S11-S61.   ESRSEDRATE 32 (H) 08/25/2010   REPTSTATUS 05/09/2011 FINAL 05/06/2011   CULT  05/06/2011    Multiple bacterial morphotypes present, none predominant. Suggest appropriate recollection if clinically indicated.   LABORGA ESCHERICHIA COLI 10/31/2008    Orders:  No orders of the defined types were placed in this encounter.  No orders of the  defined types were placed in this encounter.    Procedures: No procedures performed  Clinical Data: No additional findings.  ROS:  All other systems negative, except as noted in the HPI. Review of Systems  Objective: Vital Signs: LMP 08/05/2014 (Approximate)   Specialty Comments:  No specialty comments available.  PMFS History: Patient Active Problem List   Diagnosis Date Noted  . Spondylosis without myelopathy or radiculopathy, lumbar region 10/03/2017  . Acute folliculitis- likely due to MRSA, they are draining 06/19/2017  . Personal history of MRSA (methicillin resistant Staphylococcus aureus) 06/19/2017  . Chronic diarrhea  06/19/2017  . Contact dermatitis and eczema- due to tape\ Band-Aid 06/19/2017  . Abdominal pain, LUQ 05/02/2017  . Abdominal pain, LLQ 05/02/2017  . Hx of diarrhea 05/02/2017  . Environmental and seasonal allergies  09/29/2016  . GERD (gastroesophageal reflux disease) 09/29/2016  . Bipolar 1 disorder - Anxiety 09/28/2016  . Fibromyalgia 08/11/2016  . Diet-controlled type 2 diabetes mellitus ([place]) 12/29/2015  . OSA on CPAP 12/08/2014  . Obstructive sleep apnea syndrome, moderate 11/24/2014  . Fatty liver 09/05/2014  . S/P abdominal hysterectomy 08/15/2014  . Bladder dysfunction 04/30/2014  . Nephrolithiasis 03/04/2014  . Agenesis, corpus callosum ([place]) 02/17/2014  . GAD (generalized anxiety disorder) 02/17/2014  . History of blood transfusion 02/17/2014  . Obesity (BMI 30-39.9) 02/17/2014   Past Medical History:  Diagnosis Date  . Anemia   . Anxiety   . Depression    rx presc but not taken  . Diabetes mellitus without complication ([place])   . Fibromyalgia   . GERD (gastroesophageal reflux disease)    no meds  . Headache(784.0)    tension and with anxiety hx  . Hepatic steatosis   . History of kidney stones   . MVP (mitral valve prolapse)    echo 10  . Pneumonia    hx  . Seizures ([place])    non epileptic events per epilepsy monitoring unit  . Seizures ([place])    last seizure July 2015- does not convulse but has a sleep effect  . Sinus infection    recent on antibiotic  . Sleep apnea    has cpap does not use    Family History  Problem Relation Age of Onset  . Diabetes Mother   . Hypertension Mother   . Healthy Father   . Breast cancer Maternal Grandmother   . Cancer Maternal Grandfather        brain, lung, liver  . Heart attack Paternal Grandfather     Past Surgical History:  Procedure Laterality Date  . ABDOMINAL HYSTERECTOMY N/A 08/15/2014   Procedure: HYSTERECTOMY ABDOMINAL WITH CYSTOSCOPY;  Surgeon: Sanjuana Kava, MD;  Location: [facility];  Service: Gynecology;  Laterality: N/A;  . CESAREAN SECTION N/A   . SHOULDER ARTHROSCOPY WITH SUBACROMIAL DECOMPRESSION AND OPEN ROTATOR C Left 11/27/2015   Procedure: LEFT SHOULDER ARTHROSCOPY WITH SUBACROMIAL DECOMPRESSION, MINI OPEN ROTATOR  CUFF REPAIR;  Surgeon: Netta Cedars, MD;  Location: [facility];  Service: Orthopedics;  Laterality: Left;  . TUBAL LIGATION     Social History   Occupational History  . Not on file.   Social History Main Topics  . Smoking status: Never Smoker  . Smokeless tobacco: Never Used  . Alcohol use No  . Drug use: No  . Sexual activity: Yes    Birth control/ protection: Surgical

## 2017-10-11 ENCOUNTER — Ambulatory Visit (INDEPENDENT_AMBULATORY_CARE_PROVIDER_SITE_OTHER): Payer: PPO | Admitting: Family Medicine

## 2017-10-11 ENCOUNTER — Encounter: Payer: Self-pay | Admitting: Family Medicine

## 2017-10-11 VITALS — BP 148/93 | HR 97 | Ht 69.0 in | Wt 227.5 lb

## 2017-10-11 DIAGNOSIS — L089 Local infection of the skin and subcutaneous tissue, unspecified: Secondary | ICD-10-CM

## 2017-10-11 DIAGNOSIS — Z8614 Personal history of Methicillin resistant Staphylococcus aureus infection: Secondary | ICD-10-CM | POA: Diagnosis not present

## 2017-10-11 MED ORDER — MUPIROCIN 2 % EX OINT: 1.0000 "application " | TOPICAL_OINTMENT | Freq: Three times a day (TID) | CUTANEOUS | 3 refills | Status: DC

## 2017-10-11 NOTE — Patient Instructions (Signed)
In the future when you get these please keep them clean and dry.  You can buy chlorhexidine over-the-counter if you wish to keep the area very clean.  Also, please do not pinch, pop or rub the areas.  If they happen to break just keep them covered with a Band-Aid at all times.  Change it daily and clean daily with chlorhexidine.  Use mupirocin ointment if they are not resolving on their own with the care above.  Follow-up with [place] if they are lasting for more than 1-2 weeks and/or the pustule is greater than 1 cm in diameter  We did do a culture so we will call you in the next week to 10 days about what those results show.  Patient prefers a phone call and not to be contacted via my chart.

## 2017-10-11 NOTE — Progress Notes (Signed)
Pt here for an acute care OV today   Impression and Recommendations:    1. Personal history of MRSA (methicillin resistant Staphylococcus aureus)   2. Skin pustule *2 on left side mid back     bactroban script given to pt  In the future when you get these please keep them clean and dry.  You can buy chlorhexidine over-the-counter if you wish to keep the area very clean.  Also, please do not pinch, pop or rub the areas.  If they happen to break just keep them covered with a Band-Aid at all times.  Change it daily and clean daily with chlorhexidine.  Use mupirocin ointment if they are not resolving on their own with the care above.  Follow-up with [place] if they are lasting for more than 1-2 weeks and/or the pustule is greater than 1 cm in diameter  We did do a culture so we will call you in the next week to 10 days about what those results show.  Patient prefers a phone call and not to be contacted via my chart.  Procedure:  Incision and drainage of pustule Risks, benefits, and alternatives explained to patient and consent obtained. Time out conducted. Surface cleaned with alcohol * 3 Area prepped and draped in a sterile fashion. #11 blade used to make a stab incision into abscess/pustule. Pus expressed with pressure, culture obtained Hemostasis present,  sterile dressing placed  Pt tolerated procedure well; stable. Wound and aftercare discussed with patient; follow-up advised; all questions answered.   Procedure:  Incision and drainage of abscess Abcess:  Less than 0.17mm Risks, benefits, and alternatives explained and consent obtained. Time out conducted. Surface cleaned with chlorhexidine * 3 Area prepped and draped in a sterile fashion. #18 G needle used to make a stab incision into abscess. Pus expressed with light pressure. Cultures obtained Hemostasis achieved. Sterile dressing placed EBL: Less than 1 mL Pt tolerated procedure well; stable. Wound care, aftercare  and follow-up advised.   All questions were answered.    Education and routine counseling performed. Handouts provided.  Gross side effects, risk and benefits, and alternatives of medications and treatment plan in general discussed with patient.  Patient is aware that all medications have potential side effects and we are unable to predict every side effect or drug-drug interaction that may occur.   Patient will call with any questions prior to using medication if they have concerns.  Expresses verbal understanding and consents to current therapy and treatment regimen.  No barriers to understanding were identified.  Red flag symptoms and signs discussed in detail.  Patient expressed understanding regarding what to do in case of emergency\urgent symptoms  Please see AVS handed out to patient at the end of our visit for further patient instructions/ counseling done pertaining to today's office visit.   Return if symptoms worsen or fail to improve, for Please follow-up as planned prior for chronic care.     Note: This document was prepared using Dragon voice recognition software and may include unintentional dictation errors.  [name] 4:56 PM --------------------------------------------------------------------------------------------------------------------------------------------------------------------------------------------------------------------------------------------    Subjective:    CC:  Chief Complaint  Patient presents with  . Abscess    HPI: Samantha [name] is a 36 y.o. female who presents to [place] [facility] today for issues as discussed below.  Patient noticed 2 pustules on her back which started approximately 2 days ago.  Is been so bad with pain it has been  keeping her up at night.  She has a known history of MRSA.  She did happen to break 1 of them but the other one has intact and is a pustule.  She presents to the clinic for treatment.   No fever chills nausea vomiting or other illness feelings.   Wt Readings from Last 3 Encounters:  01/01/19 225 lb 1.6 oz (102.1 kg)  11/08/18 221 lb 3.2 oz (100.3 kg)  09/04/18 218 lb (98.9 kg)   BP Readings from Last 3 Encounters:  01/04/19 (!) 140/98  01/01/19 114/74  11/08/18 132/84   BMI Readings from Last 3 Encounters:  01/01/19 32.30 kg/m  11/08/18 31.74 kg/m  09/04/18 31.28 kg/m     Patient Care Team    Relationship Specialty Notifications Start End  [name], DO PCP - General Family Medicine  09/29/16   Pedro Earls, MD Attending Physician Family Medicine  09/29/16   Carol Ada, MD Consulting Physician Gastroenterology  06/19/17   Marcial Pacas, MD Consulting Physician Neurology  08/24/17   Willia Craze, NP Nurse Practitioner Gastroenterology  04/06/18   Ob/Gyn, [facility]    07/24/18      Patient Active Problem List   Diagnosis Date Noted  . Type 2 diabetes mellitus with complication, without long-term current use of insulin ([place]) 12/29/2015    Priority: High  . Obesity (BMI 30-39.9) 02/17/2014    Priority: High  . Acute folliculitis- likely due to MRSA, they are draining 06/19/2017    Priority: Medium  . Personal history of MRSA (methicillin resistant Staphylococcus aureus) 06/19/2017    Priority: Medium  . Environmental and seasonal allergies 09/29/2016    Priority: Medium  . GERD (gastroesophageal reflux disease) 09/29/2016    Priority: Medium  . Bipolar 1 disorder - Anxiety 09/28/2016    Priority: Medium  . Fatty liver 09/05/2014    Priority: Medium  . Chronic diarrhea  06/19/2017    Priority: Low  . Contact dermatitis and eczema- due to tape\ Band-Aid 06/19/2017    Priority: Low  . Fibromyalgia 08/11/2016    Priority: Low  . OSA on CPAP 12/08/2014    Priority: Low  . Obstructive sleep apnea syndrome, moderate 11/24/2014    Priority: Low  . S/P abdominal hysterectomy 08/15/2014    Priority: Low  . Finger pain, left 01/01/2019    . Allergy history unknown- possible throat closure sensation to certain foods 71/24/5809  . Neuropathic pain 09/04/2018  . Chronic radicular pain of lower back 09/04/2018  . Lumbar foraminal stenosis-L5-S1 09/04/2018  . Osteoarthritis of spine with radiculopathy, lumbar region 09/04/2018  . Obesity, Class I, BMI 30-34.9 09/04/2018  . Piriformis syndrome, left 07/12/2018  . Single episode of elevated blood pressure 07/12/2018  . Hypertriglyceridemia 04/11/2018  . Low level of high density lipoprotein (HDL) 04/11/2018  . Vitamin D insufficiency 04/11/2018  . Epigastric abdominal pain with N 04/06/2018  . History of vitamin D deficiency 04/06/2018  . Spondylosis without myelopathy or radiculopathy, lumbar region 10/03/2017  . Abdominal pain, LUQ 05/02/2017  . Abdominal pain, LLQ 05/02/2017  . Hx of diarrhea 05/02/2017  . Bladder dysfunction 04/30/2014  . Nephrolithiasis 03/04/2014  . Agenesis, corpus callosum ([place]) 02/17/2014  . GAD (generalized anxiety disorder) 02/17/2014  . History of blood transfusion 02/17/2014    Past Medical history, Surgical history, Family history, Social history, Allergies and Medications have been entered into the medical record, reviewed and changed as needed.    Current Meds  Medication Sig  . [DISCONTINUED]  amitriptyline (ELAVIL) 25 MG tablet Take 1 tablet (25 mg total) by mouth at bedtime.  . [DISCONTINUED] diphenoxylate-atropine (LOMOTIL) 2.5-0.025 MG tablet Take 1 tablet by mouth 2 (two) times daily. Hold if constipated (Patient taking differently: Take 1 tablet by mouth 2 (two) times daily. Hold if constipated)  . [DISCONTINUED] gabapentin (NEURONTIN) 100 MG capsule Take 1 capsule (100 mg total) by mouth 3 (three) times daily.  . [DISCONTINUED] ibuprofen (ADVIL,MOTRIN) 600 MG tablet Take 1 tablet (600 mg total) by mouth every 6 (six) hours as needed.  . [DISCONTINUED] oxyCODONE-acetaminophen (ROXICET) 5-325 MG tablet Take 1 tablet by mouth every 4  (four) hours as needed for severe pain.  . [DISCONTINUED] predniSONE (DELTASONE) 10 MG tablet Take 6 tablets on day 1, take 5 tablets on day 2, take 4 tablets on day 3, take 3 tablets on day 4, take 2 tablets on day 5, take 1 tablet on day 6    Allergies:  Allergies  Allergen Reactions  . Sulfa Antibiotics Anaphylaxis and Swelling  . Duloxetine Hcl Other (See Comments)    Weight gain, feels "crazy"  . Penicillins Rash    Has patient had a PCN reaction causing immediate rash, facial/tongue/throat swelling, SOB or lightheadedness with hypotension: Yes Has patient had a PCN reaction causing severe rash involving mucus membranes or skin necrosis: No Has patient had a PCN reaction that required hospitalization No Has patient had a PCN reaction occurring within the last 10 years: No If all of the above answers are "NO", then may proceed with Cephalosporin use.      Review of Systems: General:   Denies fever, chills, unexplained weight loss.  Optho/Auditory:   Denies visual changes, blurred vision/LOV Respiratory:   Denies wheeze, DOE more than baseline levels.  Cardiovascular:   Denies chest pain, palpitations, new onset peripheral edema  Gastrointestinal:   Denies nausea, vomiting, diarrhea, abd pain.  Genitourinary: Denies dysuria, freq/ urgency, flank pain or discharge from genitals.  Endocrine:     Denies hot or cold intolerance, polyuria, polydipsia. Musculoskeletal:   Denies unexplained myalgias, joint swelling, unexplained arthralgias, gait problems.  Skin:  Denies new onset rash, suspicious lesions Neurological:     Denies dizziness, unexplained weakness, numbness  Psychiatric/Behavioral:   Denies mood changes, suicidal or homicidal ideations, hallucinations    Objective:   Blood pressure (!) 148/93, pulse 97, height 5\' 9"  (1.753 m), weight 227 lb 8 oz (103.2 kg), last menstrual period 08/05/2014. Body mass index is 33.6 kg/m. General:  Well Developed, well nourished,  appropriate for stated age.  Neuro:  Alert and oriented,  extra-ocular muscles intact  HEENT:  Normocephalic, atraumatic, neck supple Skin:  no gross rash, warm, pink. Several pustules- well heeled and scabbe dover on L arm, 1 fresh pustule recently D surface that is already losing mid left back less than 2 mm diameter.  A second pustule intact just to right of area with surrounding erythema of approximately 1 inch.  Pustule is only 0.2 mm. Cardiac:  RRR, S1 S2 Respiratory:  ECTA B/L and A/P, Not using accessory muscles, speaking in full sentences- unlabored. Vascular:  Ext warm, no cyanosis apprec.; cap RF less 2 sec. Psych:  No HI/SI, judgement and insight good, Euthymic mood. Full Affect.

## 2017-10-13 ENCOUNTER — Encounter: Payer: Self-pay | Admitting: Family Medicine

## 2017-10-16 LAB — ANAEROBIC AND AEROBIC CULTURE

## 2017-10-23 ENCOUNTER — Telehealth: Payer: Self-pay | Admitting: Family Medicine

## 2017-10-23 NOTE — Telephone Encounter (Signed)
Pt states her mychart doesn't work properly so she does NOT use it perfers a phone call to discuss results--frwding message to medical assistant/ provider. --glh

## 2017-10-23 NOTE — Telephone Encounter (Signed)
Pt informed of results. Pt expressed understanding and is agreeable. Ingrid Jolicia Delira CMA, RT 

## 2017-12-01 ENCOUNTER — Ambulatory Visit (INDEPENDENT_AMBULATORY_CARE_PROVIDER_SITE_OTHER): Payer: PPO | Admitting: Family Medicine

## 2017-12-01 ENCOUNTER — Encounter: Payer: Self-pay | Admitting: Family Medicine

## 2017-12-01 VITALS — BP 123/75 | HR 84 | Temp 98.6°F | Resp 16 | Ht 69.0 in | Wt 230.9 lb

## 2017-12-01 DIAGNOSIS — R062 Wheezing: Secondary | ICD-10-CM

## 2017-12-01 DIAGNOSIS — J209 Acute bronchitis, unspecified: Secondary | ICD-10-CM

## 2017-12-01 DIAGNOSIS — E119 Type 2 diabetes mellitus without complications: Secondary | ICD-10-CM | POA: Diagnosis not present

## 2017-12-01 DIAGNOSIS — R059 Cough, unspecified: Secondary | ICD-10-CM

## 2017-12-01 DIAGNOSIS — E669 Obesity, unspecified: Secondary | ICD-10-CM

## 2017-12-01 DIAGNOSIS — R0602 Shortness of breath: Secondary | ICD-10-CM | POA: Diagnosis not present

## 2017-12-01 DIAGNOSIS — M940 Chondrocostal junction syndrome [Tietze]: Secondary | ICD-10-CM

## 2017-12-01 DIAGNOSIS — R05 Cough: Secondary | ICD-10-CM

## 2017-12-01 MED ORDER — HYDROCOD POLST-CPM POLST ER 10-8 MG/5ML PO SUER: ORAL | 0 refills | Status: DC

## 2017-12-01 MED ORDER — PREDNISONE 20 MG PO TABS: ORAL_TABLET | ORAL | 0 refills | Status: DC

## 2017-12-01 MED ORDER — METHYLPREDNISOLONE ACETATE 40 MG/ML IJ SUSP
40.0000 mg | Freq: Once | INTRAMUSCULAR | Status: AC
  Administered 2017-12-01: 40 mg via INTRAMUSCULAR

## 2017-12-01 MED ORDER — ALBUTEROL SULFATE HFA 108 (90 BASE) MCG/ACT IN AERS: 1.0000 | INHALATION_SPRAY | RESPIRATORY_TRACT | 2 refills | Status: DC | PRN

## 2017-12-01 NOTE — Patient Instructions (Signed)
Follow-up 4 weeks for A1c, other routine labs if needed and follow-up chronic care of diabetes etc.  -Please let me know if you are not feeling better by this Monday, in 3 days.  If you are not turning the corner, we will call in prescription for Z-Pak in addition to medicines given today.

## 2017-12-01 NOTE — Progress Notes (Signed)
Acute Care Office visit  Assessment and plan:  1. Acute costochondritis   2. Acute bronchitis, unspecified organism   3. Cough   4. Shortness of breath   5. Diet-controlled type 2 diabetes mellitus (HCC)   6. Obesity (BMI 30-39.9)   7. Wheeze    1. Medications prescribed as listed below. 2. Continue taking alkaseltzer plus or mucinex DM for symptoms. Use Vicks Vapor Rub as needed. Take tylenol for pain. 3. Continue drinking fluids often.  4. Recommended Kegels to improve urinary incontinence 5. Follow up in 4 weeks for A1c testing and routine chronic care. 6. Call the office or message on MyChart for Zpack prescription if symptoms worsen after 3 days.   Meds ordered this encounter  Medications  . chlorpheniramine-HYDROcodone (TUSSIONEX) 10-8 MG/5ML SUER    Sig: Take 5 mL every 8 hours as needed cough    Dispense:  200 mL    Refill:  0  . albuterol (PROVENTIL HFA;VENTOLIN HFA) 108 (90 Base) MCG/ACT inhaler    Sig: Inhale 1-2 puffs into the lungs every 4 (four) hours as needed for wheezing or shortness of breath.    Dispense:  1 Inhaler    Refill:  2  . predniSONE (DELTASONE) 20 MG tablet    Sig: 3 tabs po * 2 d, then 2 tabs for 2 d, then 1 tab 2 d, then 1/2 tab 2d.(Start 12/29)    Dispense:  15 tablet    Refill:  0  . methylPREDNISolone acetate (DEPO-MEDROL) injection 40 mg    - Viral vs Allergic vs Bacterial causes for pt's symptoms reveiwed.    - Supportive care and various OTC medications discussed in addition to any prescribed. - Call or RTC if new symptoms, or if no improvement or worse over next several days.   - Will consider ABX if sx continue past 10 days and worsening.    Gross side effects, risk and benefits, and alternatives of medications discussed with patient.  Patient is aware that all medications have potential side effects and we are unable to predict every sideeffect or drug-drug interaction that may occur.  Expresses verbal understanding and consents  to current therapy plan and treatment regiment.   Education and routine counseling performed. Handouts provided.   Anticipatory guidance and routine counseling done re: condition, txmnt options and need for follow up. All questions of patient's were answered.   Return in about 4 weeks (around 12/29/2017) for A1c/ Dm, chronic care.  Please see AVS handed out to patient at the end of our visit for additional patient instructions/ counseling done pertaining to today's office visit.  Note: This document was prepared using Dragon voice recognition software and may include unintentional dictation errors.   This document serves as a record of services personally performed by [name], DO. It was created on her behalf by Mayer Masker, a trained medical scribe. The creation of this record is based on the scribe's personal observations and the provider's statements to them.   I have reviewed the above documentation for accuracy and completeness, and I agree with the above.   [name] 12/01/17 10:00 AM   Subjective:    Chief Complaint  Patient presents with  . Cough    4/5 days, SOB, wheezing, tightness, dry     HPI:  Pt presents with Sx for 4-5 days ago.   Cough Dry in nature that has worsened over time. She has associated chest tightness, SOB, mild wheezing, and occasional urinary incontinence  when she coughs. She states she has a mild "tickle" in the back of her throat, which causes her to cough, and when she lies flat, it feels like an "elephant is sitting on her chest". She also states when she coughs, she feels a burning sensation down her back. She has taken alkaseltzer plus with no relief. She denies CP, fever, chills, and congestion. She states she had previous congestion about a month ago. Her entire family is also not feeling well, her children have similar symptoms.  Denies:  objective F/C, CP, chills, congestion    For symptoms patient has tried:  Alkaseltzer  Plus  Overall getting:   Worse   Past medical history, Surgical history, Family history reviewed and noted below, Social history, Allergies, and Medications have been entered into the medical record, reviewed and changed as needed.   Allergies  Allergen Reactions  . Sulfa Antibiotics Anaphylaxis and Swelling  . Duloxetine Hcl Other (See Comments)    Weight gain, feels "crazy"  . Penicillins Rash    Has patient had a PCN reaction causing immediate rash, facial/tongue/throat swelling, SOB or lightheadedness with hypotension: Yes Has patient had a PCN reaction causing severe rash involving mucus membranes or skin necrosis: No Has patient had a PCN reaction that required hospitalization No Has patient had a PCN reaction occurring within the last 10 years: No If all of the above answers are "NO", then may proceed with Cephalosporin use.     Review of Systems: General:   No F/C, wt loss Pulm:   No DIB, pleuritic chest pain Card:  No CP, palpitations Abd:  No n/v/d or pain Ext:  No inc edema from baseline   Objective:   Blood pressure 123/75, pulse 84, temperature 98.6 F (37 C), temperature source Oral, resp. rate 16, height 5\' 9"  (1.753 m), weight 230 lb 14.4 oz (104.7 kg), last menstrual period 08/05/2014, SpO2 97 %. Body mass index is 34.1 kg/m. General: Well Developed, well nourished, appropriate for stated age.  Neuro: Alert and oriented x3, extra-ocular muscles intact, sensation grossly intact.  HEENT: Normocephalic, atraumatic, pupils equal round reactive to light, neck supple, no masses, no painful lymphadenopathy, TM's intact B/L, no acute findings. Nares- patent, clear d/c, OP- clear, mild erythema, No TTP sinuses Skin: Warm and dry, no gross rash. Cardiac: RRR, S1 S2,  no murmurs rubs or gallops.  Respiratory: ECTA B/L and A/P, Not using accessory muscles, speaking in full sentences- unlabored. Decreased aeration globally with reflexive cough with almost every  inhalation Vascular:  No gross lower ext edema, cap RF less 2 sec. Psych: No HI/SI, judgement and insight good, Euthymic mood. Full Affect.   Patient Care Team    Relationship Specialty Notifications Start End  [name], DO PCP - General Family Medicine  09/29/16   Pedro Earls, MD Attending Physician Family Medicine  09/29/16   Carol Ada, MD Consulting Physician Gastroenterology  06/19/17   Marcial Pacas, MD Consulting Physician Neurology  08/24/17

## 2017-12-13 ENCOUNTER — Telehealth: Payer: Self-pay | Admitting: Family Medicine

## 2017-12-13 NOTE — Telephone Encounter (Signed)
Patient's husband called wanting to speak with someone clinical about a recommended treatment that her therapist is wanting. He can be reached at his mobile [phone]

## 2017-12-15 NOTE — Telephone Encounter (Signed)
Called and spoke to the patient's husband and he states that patient is seeing a therapist and was recommended Eye Movement Desensitization and Reprocessing (EMDR) therapy. Therapist wanted patient to reach out to her PCP before starting to make sure that Dr. Raliegh Scarlet agrees that this is okay for the patient to try.  Therapist is Meg Martinique and her number is [phone].  Please advise. MPulliam, CMA/RT(R)

## 2017-12-16 NOTE — Telephone Encounter (Signed)
Yes, that's fine 

## 2017-12-19 NOTE — Telephone Encounter (Signed)
Spoke to patient's husband and notified.  Also spoke with Meg Martinique (patient's therapist), and she request a typed up letter from Dr. Raliegh Scarlet.  Once done I will e-mail to Ms. Martinique. MPulliam, CMA/RT(R)

## 2018-02-02 DIAGNOSIS — Z6833 Body mass index (BMI) 33.0-33.9, adult: Secondary | ICD-10-CM | POA: Diagnosis not present

## 2018-02-02 DIAGNOSIS — Z01419 Encounter for gynecological examination (general) (routine) without abnormal findings: Secondary | ICD-10-CM | POA: Diagnosis not present

## 2018-02-27 ENCOUNTER — Ambulatory Visit (INDEPENDENT_AMBULATORY_CARE_PROVIDER_SITE_OTHER): Payer: PPO | Admitting: Family Medicine

## 2018-02-27 ENCOUNTER — Telehealth: Payer: Self-pay

## 2018-02-27 ENCOUNTER — Encounter: Payer: Self-pay | Admitting: Family Medicine

## 2018-02-27 VITALS — BP 138/86 | HR 96 | Temp 98.1°F | Ht 69.0 in | Wt 230.0 lb

## 2018-02-27 DIAGNOSIS — R0602 Shortness of breath: Secondary | ICD-10-CM

## 2018-02-27 DIAGNOSIS — J209 Acute bronchitis, unspecified: Secondary | ICD-10-CM | POA: Diagnosis not present

## 2018-02-27 DIAGNOSIS — R05 Cough: Secondary | ICD-10-CM

## 2018-02-27 DIAGNOSIS — R059 Cough, unspecified: Secondary | ICD-10-CM

## 2018-02-27 DIAGNOSIS — E118 Type 2 diabetes mellitus with unspecified complications: Secondary | ICD-10-CM | POA: Diagnosis not present

## 2018-02-27 DIAGNOSIS — J329 Chronic sinusitis, unspecified: Secondary | ICD-10-CM | POA: Diagnosis not present

## 2018-02-27 DIAGNOSIS — M25571 Pain in right ankle and joints of right foot: Secondary | ICD-10-CM | POA: Diagnosis not present

## 2018-02-27 DIAGNOSIS — J310 Chronic rhinitis: Secondary | ICD-10-CM

## 2018-02-27 LAB — POCT GLYCOSYLATED HEMOGLOBIN (HGB A1C): Hemoglobin A1C: 6.9

## 2018-02-27 MED ORDER — AMOXICILLIN-POT CLAVULANATE 875-125 MG PO TABS: 1.0000 | ORAL_TABLET | Freq: Two times a day (BID) | ORAL | 0 refills | Status: AC

## 2018-02-27 MED ORDER — HYDROCOD POLST-CPM POLST ER 10-8 MG/5ML PO SUER: 5.0000 mL | Freq: Two times a day (BID) | ORAL | 0 refills | Status: DC | PRN

## 2018-02-27 MED ORDER — METFORMIN HCL 500 MG PO TABS: ORAL_TABLET | ORAL | 0 refills | Status: DC

## 2018-02-27 MED ORDER — METHYLPREDNISOLONE SODIUM SUCC 125 MG IJ SOLR
125.0000 mg | Freq: Once | INTRAMUSCULAR | Status: AC
  Administered 2018-02-27: 125 mg via INTRAMUSCULAR

## 2018-02-27 NOTE — Patient Instructions (Addendum)
You will have a refill of your albuterol\Proventil at [facility] drug.  Please call them and ask about refilling that.  There is already a prescription there from 12\28 with 2 refills.  -Start antibiotics of Augmentin now.  Please double check with your pharmacy and/or your mother to make sure it is okay to take amoxicillin. -We will have a steroid shot today in the office. -Please use the Tussionex cough medicine as needed for cough as well as your albuterol inhaler, as needed for shortness of breath or wheeze.

## 2018-02-27 NOTE — Progress Notes (Signed)
Acute Care Office visit  Assessment and plan:  1. Rhinosinusitis   2. Acute bronchitis, unspecified organism   3. Shortness of breath   4. Cough   5. Type 2 diabetes mellitus with complication, without long-term current use of insulin (HCC)     - Supportive care and various OTC medications discussed in addition to any prescribed. - Tylenol cold & sinus and Advil cold & sinus recommended.  - Antibiotics prescribed today - Augmentin - to cover bronchitis and rhino sinusitis since 7 d of sx and getting W.  - Tussionex only as needed, prescribed for patient's nightly cough.  - Albuterol only as needed.  - Steroid injection prescribed today   - Call or RTC if new symptoms, or if no improvement or worse over next several days.     DM-T2: -A1c was 6.3 on 6\25\18.  Patient has been lost to chronic follow-up even though we have been encouraging to occur to come in for recheck. -Even though patient came in for an acute care office visit today, A1c was obtained and found to be 6.9. -We will start patient on metformin at 500 twice daily for 1 week then increase to 1000 twice daily as tolerated to goal blood sugars of less than 120 fasting and less than 170 postprandial -We will see patient back in the very near future, my next chronic office visit that is available.   Meds ordered this encounter  Medications  . amoxicillin-clavulanate (AUGMENTIN) 875-125 MG tablet    Sig: Take 1 tablet by mouth 2 (two) times daily for 10 days.    Dispense:  20 tablet    Refill:  0  . methylPREDNISolone sodium succinate (SOLU-MEDROL) 125 mg/2 mL injection 125 mg  . chlorpheniramine-HYDROcodone (TUSSIONEX) 10-8 MG/5ML SUER    Sig: Take 5 mLs by mouth every 12 (twelve) hours as needed for cough (cough, will cause drowsiness.).    Dispense:  120 mL    Refill:  0  . metFORMIN (GLUCOPHAGE) 500 MG tablet    Sig: Take 500 twice daily for 1 week then increase to 2 tablets twice daily as tol to goal BS   Dispense:  360 tablet    Refill:  0    Orders Placed This Encounter  Procedures  . POCT glycosylated hemoglobin (Hb A1C)   Gross side effects, risk and benefits, and alternatives of medications discussed with patient.  Patient is aware that all medications have potential side effects and we are unable to predict every sideeffect or drug-drug interaction that may occur.  Expresses verbal understanding and consents to current therapy plan and treatment regiment.   Education and routine counseling performed. Handouts provided.  Anticipatory guidance and routine counseling done re: condition, txmnt options and need for follow up. All questions of patient's were answered.  Return if symptoms worsen or fail to improve, for please f/up near future for chronic f/up appt for BP, BS, wt etc.  Please see AVS handed out to patient at the end of our visit for additional patient instructions/ counseling done pertaining to today's office visit.  Note: This document was partially repared using Dragon voice recognition software and may include unintentional dictation errors.  This document serves as a record of services personally performed by [name], DO. It was created on her behalf by Toni Amend, a trained medical scribe. The creation of this record is based on the scribe's personal observations and the provider's statements to them.   I have reviewed the above  medical documentation for accuracy and completeness and I concur.  [name] 02/27/18 4:09 PM    Subjective:    Chief Complaint  Patient presents with  . Sinus Problem    sinus pressure, head congestion, cough x 1 wk     HPI:  Pt presents with Sx for 7 days.   C/o:  "Nasty five pack a day smoker sounding cough."  Post-nasal drip, sneezing, "junk" in her sinuses.  Sinus pressure today.  Denies:  Fevers.    For symptoms patient has tried:  Nothing reported.  Overall getting:   Patient feels that the cough  may be getting less painful, but other symptoms are getting worse.  Patient does not prefer steroids.  Notes that they make her into a "beast."   DM:  Not checking BS, no prudent diet and not exercising   Patient Care Team    Relationship Specialty Notifications Start End  [name], DO PCP - General Family Medicine  09/29/16   Pedro Earls, MD Attending Physician Family Medicine  09/29/16   Carol Ada, MD Consulting Physician Gastroenterology  06/19/17   Marcial Pacas, MD Consulting Physician Neurology  08/24/17     Past medical history, Surgical history, Family history reviewed and noted below, Social history, Allergies, and Medications have been entered into the medical record, reviewed and changed as needed.   Allergies  Allergen Reactions  . Sulfa Antibiotics Anaphylaxis and Swelling  . Duloxetine Hcl Other (See Comments)    Weight gain, feels "crazy"  . Penicillins Rash    Has patient had a PCN reaction causing immediate rash, facial/tongue/throat swelling, SOB or lightheadedness with hypotension: Yes Has patient had a PCN reaction causing severe rash involving mucus membranes or skin necrosis: No Has patient had a PCN reaction that required hospitalization No Has patient had a PCN reaction occurring within the last 10 years: No If all of the above answers are "NO", then may proceed with Cephalosporin use.     Review of Systems: - see above HPI for pertinent positives General:   No F/C, wt loss Pulm:   No DIB, pleuritic chest pain Card:  No CP, palpitations Abd:  No n/v/d or pain Ext:  No inc edema from baseline   Objective:   Blood pressure 138/86, pulse 96, temperature 98.1 F (36.7 C), height 5\' 9"  (1.753 m), weight 230 lb (104.3 kg), last menstrual period 08/05/2014. Body mass index is 33.97 kg/m. General: Well Developed, well nourished, appropriate for stated age.  Neuro: Alert and oriented x3, extra-ocular muscles intact, sensation grossly intact.    HEENT: Normocephalic, atraumatic, pupils equal round reactive to light, neck supple, no masses, no painful lymphadenopathy, TM's intact B/L, no acute findings. Nares- patent, clear d/c, OP- clear, mild erythema, No TTP sinuses Skin: Warm and dry, no gross rash. Cardiac: RRR, S1 S2,  no murmurs rubs or gallops.  Respiratory: ECTA B/L and A/P, Not using accessory muscles, speaking in full sentences- unlabored. Vascular:  No gross lower ext edema, cap RF less 2 sec. Psych: No HI/SI, judgement and insight good, Euthymic mood. Full Affect.

## 2018-03-15 DIAGNOSIS — M25571 Pain in right ankle and joints of right foot: Secondary | ICD-10-CM | POA: Diagnosis not present

## 2018-03-22 IMAGING — DX DG CHEST 2V
2 series · 2 of 2 positions shown · non-contrast
Comparison: 07/11/2009 .

CLINICAL DATA: Arm pains.  Chest tightness.

EXAM:
CHEST  2 VIEW

[chest pa]
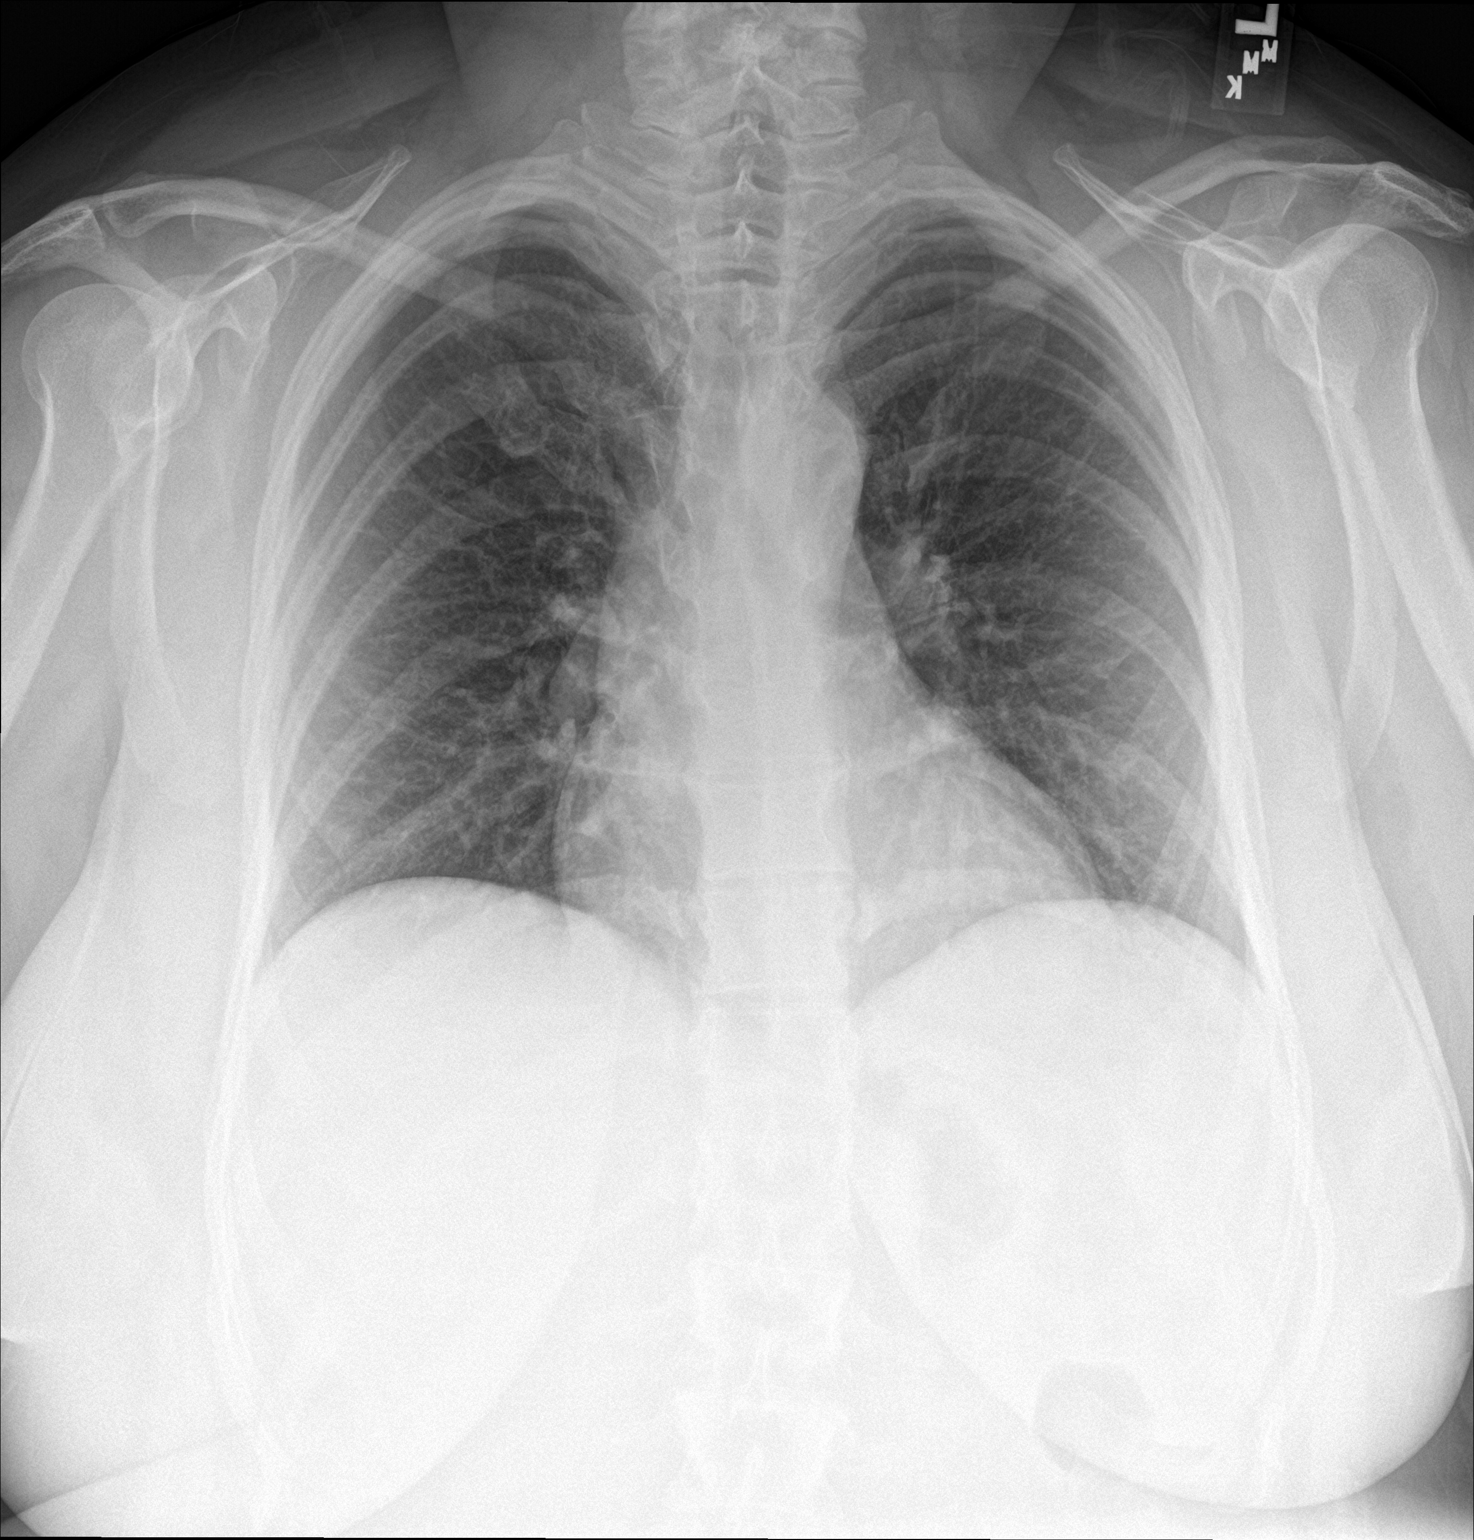

[chest lat]
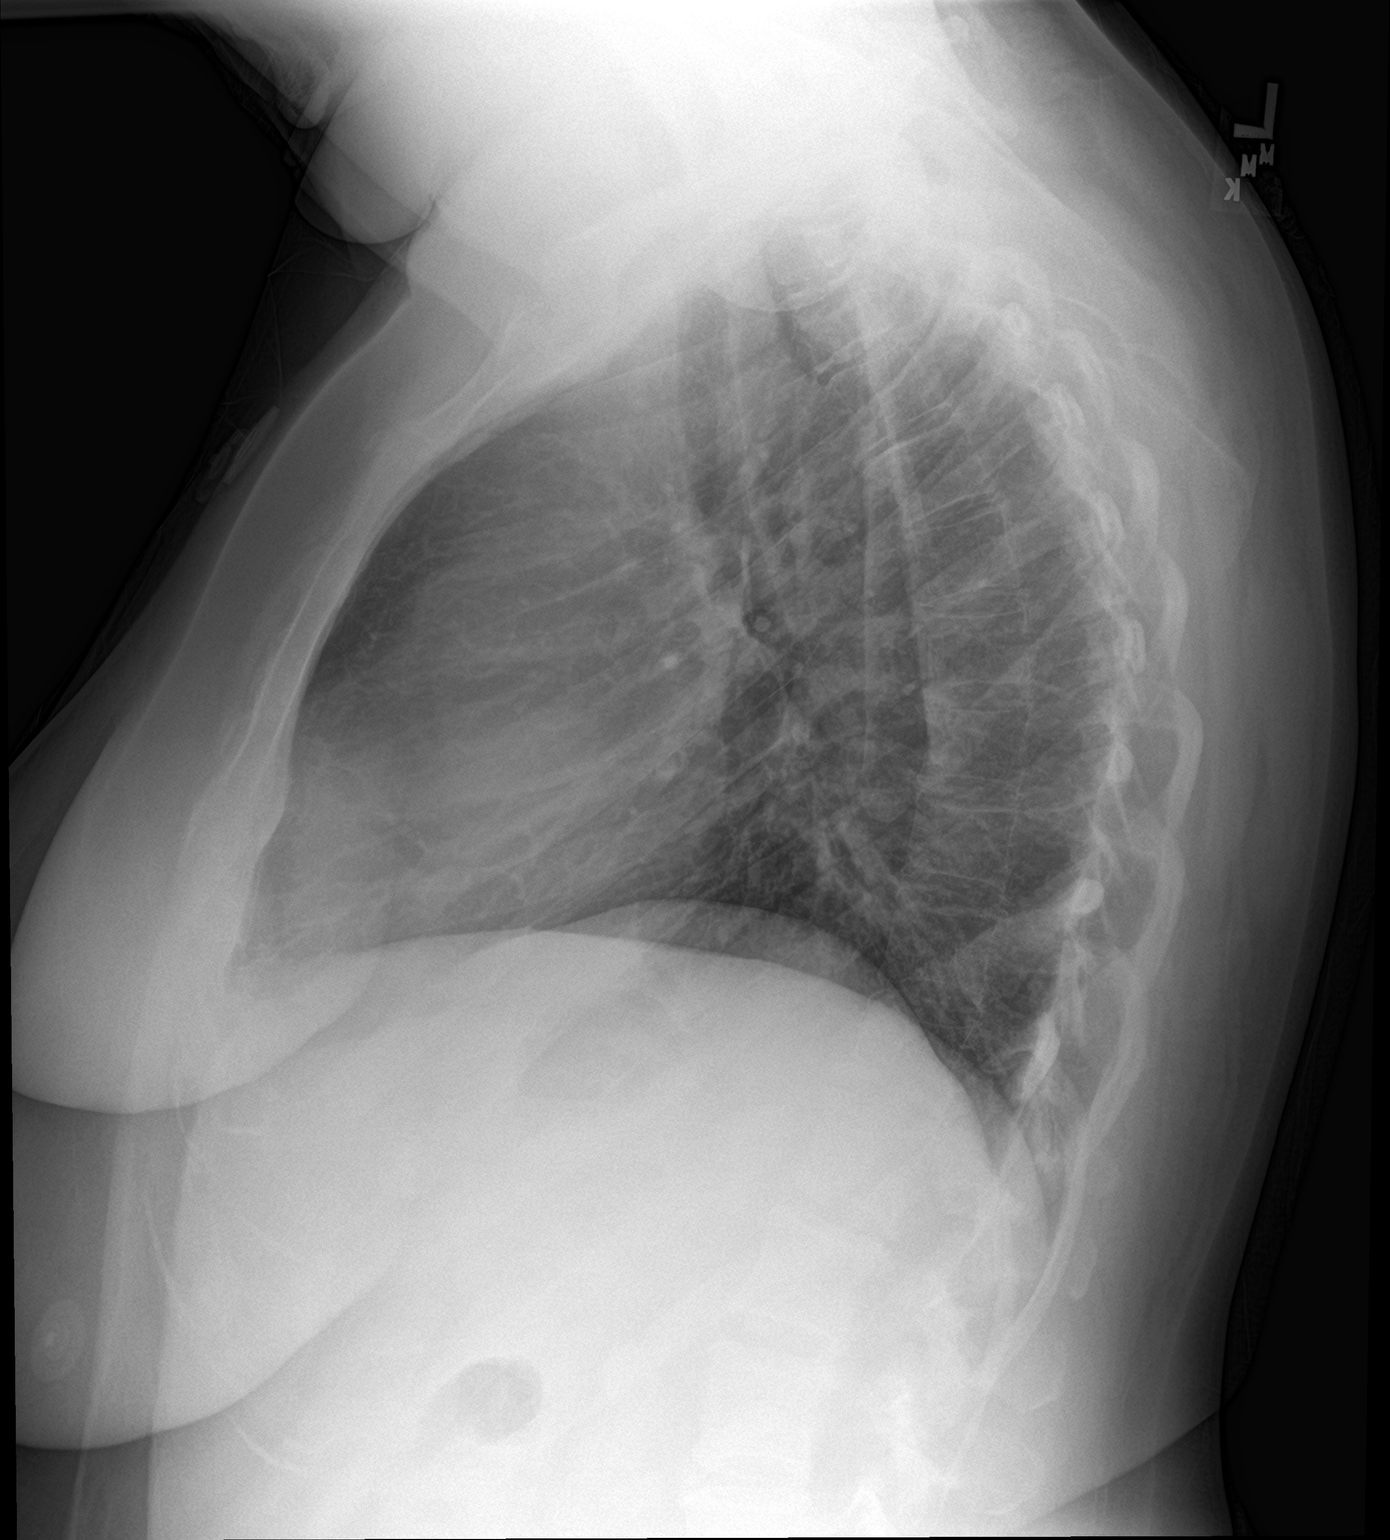

[2 of 2 positions shown; findings below may reference images not displayed]

FINDINGS: Mediastinum hilar structures normal. Lungs are clear. Heart size
normal. No acute bony abnormality .
IMPRESSION: No acute cardiopulmonary disease.

## 2018-04-03 ENCOUNTER — Ambulatory Visit: Payer: PPO | Admitting: Family Medicine

## 2018-04-06 ENCOUNTER — Encounter: Payer: Self-pay | Admitting: Family Medicine

## 2018-04-06 ENCOUNTER — Ambulatory Visit (INDEPENDENT_AMBULATORY_CARE_PROVIDER_SITE_OTHER): Payer: PPO | Admitting: Family Medicine

## 2018-04-06 ENCOUNTER — Other Ambulatory Visit: Payer: Self-pay

## 2018-04-06 VITALS — BP 136/84 | HR 89 | Ht 69.0 in | Wt 223.8 lb

## 2018-04-06 DIAGNOSIS — E669 Obesity, unspecified: Secondary | ICD-10-CM

## 2018-04-06 DIAGNOSIS — R1013 Epigastric pain: Secondary | ICD-10-CM

## 2018-04-06 DIAGNOSIS — E119 Type 2 diabetes mellitus without complications: Secondary | ICD-10-CM | POA: Diagnosis not present

## 2018-04-06 DIAGNOSIS — R110 Nausea: Secondary | ICD-10-CM | POA: Diagnosis not present

## 2018-04-06 DIAGNOSIS — Z8639 Personal history of other endocrine, nutritional and metabolic disease: Secondary | ICD-10-CM | POA: Insufficient documentation

## 2018-04-06 LAB — POCT URINALYSIS DIPSTICK
Bilirubin, UA: NEGATIVE
Glucose, UA: NEGATIVE
Ketones, UA: NEGATIVE
Leukocytes, UA: NEGATIVE
Nitrite, UA: NEGATIVE
Protein, UA: NEGATIVE
Spec Grav, UA: 1.025 (ref 1.010–1.025)
Urobilinogen, UA: 0.2 E.U./dL
pH, UA: 5.5 (ref 5.0–8.0)

## 2018-04-06 LAB — POCT UA - MICROALBUMIN
Albumin/Creatinine Ratio, Urine, POC: <30
Creatinine, POC: 200 mg/dL
Microalbumin Ur, POC: 30 mg/L

## 2018-04-06 MED ORDER — RANITIDINE HCL 300 MG PO TABS: 300.0000 mg | ORAL_TABLET | Freq: Two times a day (BID) | ORAL | 0 refills | Status: DC

## 2018-04-06 MED ORDER — BLOOD GLUCOSE METER KIT: PACK | 0 refills | Status: DC

## 2018-04-06 MED ORDER — OMEPRAZOLE 20 MG PO CPDR: 20.0000 mg | DELAYED_RELEASE_CAPSULE | Freq: Two times a day (BID) | ORAL | 0 refills | Status: DC

## 2018-04-06 MED ORDER — ONDANSETRON 8 MG PO TBDP: 8.0000 mg | ORAL_TABLET | Freq: Three times a day (TID) | ORAL | 1 refills | Status: DC | PRN

## 2018-04-06 NOTE — Patient Instructions (Addendum)
You have not been seen for follow up of chronic conditions since August of 2018.  As per our discussion today, you will need to come in for chronic follow up office visitis for refill of chronic medications. If you continue to come in just for acute care issues, we will not be able to refill your medications.   Further refills for your GI conditions per your gastroenterologist Dr. Carol Ada or the nurse practitioner Ms. Renaee Munda.     Gastroesophageal Reflux Disease, Adult Normally, food travels down the esophagus and stays in the stomach to be digested. However, when a person has gastroesophageal reflux disease (GERD), food and stomach acid move back up into the esophagus. When this happens, the esophagus becomes sore and inflamed. Over time, GERD can create small holes (ulcers) in the lining of the esophagus. What are the causes? This condition is caused by a problem with the muscle between the esophagus and the stomach (lower esophageal sphincter, or LES). Normally, the LES muscle closes after food passes through the esophagus to the stomach. When the LES is weakened or abnormal, it does not close properly, and that allows food and stomach acid to go back up into the esophagus. The LES can be weakened by certain dietary substances, medicines, and medical conditions, including:  Tobacco use.  Pregnancy.  Having a hiatal hernia.  Heavy alcohol use.  Certain foods and beverages, such as coffee, chocolate, onions, and peppermint.  What increases the risk? This condition is more likely to develop in:  People who have an increased body weight.  People who have connective tissue disorders.  People who use NSAID medicines.  What are the signs or symptoms? Symptoms of this condition include:  Heartburn.  Difficult or painful swallowing.  The feeling of having a lump in the throat.  Abitter taste in the mouth.  Bad breath.  Having a large amount of saliva.  Having an upset or  bloated stomach.  Belching.  Chest pain.  Shortness of breath or wheezing.  Ongoing (chronic) cough or a night-time cough.  Wearing away of tooth enamel.  Weight loss.  Different conditions can cause chest pain. Make sure to see your health care provider if you experience chest pain. How is this diagnosed? Your health care provider will take a medical history and perform a physical exam. To determine if you have mild or severe GERD, your health care provider may also monitor how you respond to treatment. You may also have other tests, including:  An endoscopy toexamine your stomach and esophagus with a small camera.  A test thatmeasures the acidity level in your esophagus.  A test thatmeasures how much pressure is on your esophagus.  A barium swallow or modified barium swallow to show the shape, size, and functioning of your esophagus.  How is this treated? The goal of treatment is to help relieve your symptoms and to prevent complications. Treatment for this condition may vary depending on how severe your symptoms are. Your health care provider may recommend:  Changes to your diet.  Medicine.  Surgery.  Follow these instructions at home: Diet  Follow a diet as recommended by your health care provider. This may involve avoiding foods and drinks such as: ? Coffee and tea (with or without caffeine). ? Drinks that containalcohol. ? Energy drinks and sports drinks. ? Carbonated drinks or sodas. ? Chocolate and cocoa. ? Peppermint and mint flavorings. ? Garlic and onions. ? Horseradish. ? Spicy and acidic foods, including peppers, chili powder,  curry powder, vinegar, hot sauces, and barbecue sauce. ? Citrus fruit juices and citrus fruits, such as oranges, lemons, and limes. ? Tomato-based foods, such as red sauce, chili, salsa, and pizza with red sauce. ? Fried and fatty foods, such as donuts, french fries, potato chips, and high-fat dressings. ? High-fat meats, such  as hot dogs and fatty cuts of red and white meats, such as rib eye steak, sausage, ham, and bacon. ? High-fat dairy items, such as whole milk, butter, and cream cheese.  Eat small, frequent meals instead of large meals.  Avoid drinking large amounts of liquid with your meals.  Avoid eating meals during the 2-3 hours before bedtime.  Avoid lying down right after you eat.  Do not exercise right after you eat. General instructions  Pay attention to any changes in your symptoms.  Take over-the-counter and prescription medicines only as told by your health care provider. Do not take aspirin, ibuprofen, or other NSAIDs unless your health care provider told you to do so.  Do not use any tobacco products, including cigarettes, chewing tobacco, and e-cigarettes. If you need help quitting, ask your health care provider.  Wear loose-fitting clothing. Do not wear anything tight around your waist that causes pressure on your abdomen.  Raise (elevate) the head of your bed 6 inches (15cm).  Try to reduce your stress, such as with yoga or meditation. If you need help reducing stress, ask your health care provider.  If you are overweight, reduce your weight to an amount that is healthy for you. Ask your health care provider for guidance about a safe weight loss goal.  Keep all follow-up visits as told by your health care provider. This is important. Contact a health care provider if:  You have new symptoms.  You have unexplained weight loss.  You have difficulty swallowing, or it hurts to swallow.  You have wheezing or a persistent cough.  Your symptoms do not improve with treatment.  You have a hoarse voice. Get help right away if:  You have pain in your arms, neck, jaw, teeth, or back.  You feel sweaty, dizzy, or light-headed.  You have chest pain or shortness of breath.  You vomit and your vomit looks like blood or coffee grounds.  You faint.  Your stool is bloody or  black.  You cannot swallow, drink, or eat. This information is not intended to replace advice given to you by your health care provider. Make sure you discuss any questions you have with your health care provider. Document Released: 08/31/2005 Document Revised: 04/20/2016 Document Reviewed: 03/18/2015 Elsevier Interactive Patient Education  2018 [employer].   Peptic Ulcer A peptic ulcer is a sore in the lining of the esophagus (esophageal ulcer), the stomach (gastric ulcer), or the first part of the small intestine (duodenal ulcer). The ulcer causes gradual wearing away (erosion) into the deeper tissue. What are the causes? Normally, the lining of the stomach and the small intestine protects itself from the acid that digests food. The protective lining can be damaged by:  An infection caused by a germ (bacterium) called Helicobacter pylori or H. pylori.  Regular use of NSAIDs, such as ibuprofen or aspirin.  Rare tumors in the stomach, small intestine, or pancreas (Zollinger-Ellison syndrome).  What increases the risk? The following factors may make you more likely to develop this condition:  Smoking.  Having a family history of ulcer disease.  What are the signs or symptoms? Symptoms of this condition include:  Burning pain or gnawing in the area between the chest and the belly button. The pain may be worse on an empty stomach and at night.  Heartburn.  Nausea and vomiting.  Bloating.  If the ulcer results in bleeding, it can cause:  Black, tarry stools.  Vomiting of bright red blood.  Vomiting of material that looks like coffee grounds.  How is this diagnosed? This condition may be diagnosed based on:  Medical history and physical exam.  Various tests or procedures, such as: ? Blood tests, stool tests, or breath tests to check for the H. pylori bacterium. ? An X-ray exam (upper gastrointestinal series) of the esophagus, stomach, and small intestine. ? Upper  endoscopy. The health care provider examines the esophagus, stomach, and small intestine using a small flexible tube that has a video camera at the end. ? Biopsy. A tissue sample is removed to be examined under a microscope.  How is this treated? Treatment for this condition may include:  Eliminating the cause of the ulcer, such as smoking or the use of NSAIDs or alcohol.  Medicines to reduce the amount of acid in your digestive tract.  Antibiotic medicines, if the ulcer is caused by the H. pylori bacterium.  An upper endoscopy to treat a bleeding ulcer.  Surgery, if the bleeding is severe or if the ulcer created a hole somewhere in the digestive system.  Follow these instructions at home:  Avoid alcohol and caffeine.  Do not use any tobacco products, such as cigarettes, chewing tobacco, and e-cigarettes. If you need help quitting, ask your health care provider.  Take over-the-counter and prescription medicines only as told by your health care provider. Do not use over-the-counter medicines in place of prescription medicines unless your health care provider approves.  Keep all follow-up visits as told by your health care provider. This is important. Contact a health care provider if:  Your symptoms do not improve within 7 days of starting treatment.  You have ongoing indigestion or heartburn. Get help right away if:  You have sudden, sharp, or persistent pain in your abdomen.  You have bloody or dark black, tarry stools.  You vomit blood or material that looks like coffee grounds.  You become light-headed or you feel faint.  You become weak.  You become sweaty or clammy. This information is not intended to replace advice given to you by your health care provider. Make sure you discuss any questions you have with your health care provider. Document Released: 11/18/2000 Document Revised: 04/25/2016 Document Reviewed: 08/22/2015 Elsevier Interactive Patient Education  [employer].

## 2018-04-06 NOTE — Progress Notes (Signed)
Sent in RX for glucometer for patient. MPulliam, CMA/RT(R)

## 2018-04-06 NOTE — Progress Notes (Signed)
Pt here for an acute care OV today   Impression and Recommendations:    1. Epigastric abdominal pain with N   2. Diet-controlled type 2 diabetes mellitus ([place])   3. History of vitamin D deficiency   4. Obesity (BMI 30-39.9)   5. Nausea     1. Diet controlled type 2 diabetes mellitus  - Discussed the importance of making and keeping follow up appointments for chronic illnesses - acute visits don't count - Labs obtained today- see orders below  2. Acute epigastric abdominal pain with nausea  - Will prescribe H2 blocker and PPI.  - Referral to gastroenterologist; encouraged patient to follow up with the same one she has seen in the past, Dr. Benson Norway.  - Encouraged patient to monitor symptoms and present to ED for worsening symptoms  - Recommended increased water intake  - Labs obtained today - Further refills for GI meds given today per GI.    Meds ordered this encounter  Medications  . ondansetron (ZOFRAN-ODT) 8 MG disintegrating tablet    Sig: Take 1 tablet (8 mg total) by mouth every 8 (eight) hours as needed for nausea.    Dispense:  20 tablet    Refill:  1  . omeprazole (PRILOSEC) 20 MG capsule    Sig: Take 1 capsule (20 mg total) by mouth 2 (two) times daily before a meal.    Dispense:  60 capsule    Refill:  0  . ranitidine (ZANTAC) 300 MG tablet    Sig: Take 1 tablet (300 mg total) by mouth 2 (two) times daily.    Dispense:  60 tablet    Refill:  0    Orders Placed This Encounter  Procedures  . CBC with Differential/Platelet  . Comprehensive metabolic panel  . Hemoglobin A1c  . Lipid panel  . TSH  . VITAMIN D 25 Hydroxy (Vit-D Deficiency, Fractures)  . T4, free  . Gamma GT  . Bilirubin, fractionated(tot/dir/indir)  . Lipase  . H. pylori antigen, stool  . H. pylori antibody, IgG  . Ambulatory referral to Gastroenterology  . POCT urinalysis dipstick  . POCT UA - Microalbumin     Education and routine counseling performed. Handouts  provided  Gross side effects, risk and benefits, and alternatives of medications and treatment plan in general discussed with patient.  Patient is aware that all medications have potential side effects and we are unable to predict every side effect or drug-drug interaction that may occur.   Patient will call with any questions prior to using medication if they have concerns.  Expresses verbal understanding and consents to current therapy and treatment regimen.  No barriers to understanding were identified.    Red flag symptoms and signs discussed in detail.  Patient expressed understanding regarding what to do in case of emergency\urgent symptoms   Please see AVS handed out to patient at the end of our visit for further patient instructions/ counseling done pertaining to today's office visit.   Return for f/up near future for chronic care of your DM, wt etc.     Note: This document was prepared occasionally using Dragon voice recognition software and may include unintentional dictation errors in addition to a scribe.  This document serves as a record of services personally performed by [name], DO. It was created on her behalf by Bea Graff, a trained medical scribe. The creation of this record is based on the scribe's personal observations and the provider's statements to  them.   I have reviewed the above medical documentation for accuracy and completeness and I concur.  [name] 04/06/18 1:00 PM   --------------------------------------------------------------------------------------------------------------------------------------------------------------------------------------------------------------------------------------------    Subjective:    CC:  Chief Complaint  Patient presents with  . Abdominal Pain    HPI: Samantha [name] is a 37 y.o. female who presents to [facility] today for issues as discussed below.  - Epigastric  abdominal pain with nausea  She states the pain began yesterday evening and she was unable to sleep last night. She describes the pain as a constant ache with intermittent sharp, stabbing pain of the epigastric region that has lasted all morning. She rates the pain at 5/10 but can get as high as 10/10. She reports associated nausea.   - last bowel movement was this morning and states it looked like tree bark but reports taking Pepto-Bismol last night.   She also reports difficulty urinating- unable to go when she feels need to at times.    Applying pressure with palpation of the abdomen increases the pain.  She states the nausea decreases her desire to eat but she is able to eat and keep it down.  She has not had any vomiting.   She states she drank two bottles of water yesterday but nothing more.  She was out in the heat for a feel day for 1 of her children.  It was proximately 2-4 hours long.  She denies fever, chills.   She states this is a new problem, however she has had an endoscopy and a colonoscopy about 2 years ago in 11/16.   She states she no longer has a gastroenterologist but was seeing Dr. Benson Norway and Tye Savoy, NP at that time.    She denies any previous abdominal surgeries excpt for Hyst and c-sect.    DM:  She states her fasting blood sugars have been running around 135.  PP- 180 or so.   No highs or lows.    No other complaints   Problem  Epigastric abdominal pain with N  History of Vitamin D Deficiency     Wt Readings from Last 3 Encounters:  04/06/18 223 lb 12.8 oz (101.5 kg)  02/27/18 230 lb (104.3 kg)  12/01/17 230 lb 14.4 oz (104.7 kg)   BP Readings from Last 3 Encounters:  04/06/18 136/84  02/27/18 138/86  12/01/17 123/75   BMI Readings from Last 3 Encounters:  04/06/18 33.05 kg/m  02/27/18 33.97 kg/m  12/01/17 34.10 kg/m     Patient Care Team    Relationship Specialty Notifications Start End  [name], DO PCP - General Family Medicine   09/29/16   Pedro Earls, MD Attending Physician Family Medicine  09/29/16   Carol Ada, [place] Physician Gastroenterology  06/19/17   Marcial Pacas, MD Consulting Physician Neurology  08/24/17   Willia Craze, NP Nurse Practitioner Gastroenterology  04/06/18      Patient Active Problem List   Diagnosis Date Noted  . Diet-controlled type 2 diabetes mellitus ([place]) 12/29/2015    Priority: High  . Obesity (BMI 30-39.9) 02/17/2014    Priority: High  . Acute folliculitis- likely due to MRSA, they are draining 06/19/2017    Priority: Medium  . Personal history of MRSA (methicillin resistant Staphylococcus aureus) 06/19/2017    Priority: Medium  . Environmental and seasonal allergies 09/29/2016    Priority: Medium  . GERD (gastroesophageal reflux disease) 09/29/2016    Priority: Medium  .  Bipolar 1 disorder - Anxiety 09/28/2016    Priority: Medium  . Fatty liver 09/05/2014    Priority: Medium  . Chronic diarrhea  06/19/2017    Priority: Low  . Contact dermatitis and eczema- due to tape\ Band-Aid 06/19/2017    Priority: Low  . Fibromyalgia 08/11/2016    Priority: Low  . OSA on CPAP 12/08/2014    Priority: Low  . Obstructive sleep apnea syndrome, moderate 11/24/2014    Priority: Low  . S/P abdominal hysterectomy 08/15/2014    Priority: Low  . Epigastric abdominal pain with N 04/06/2018  . History of vitamin D deficiency 04/06/2018  . Spondylosis without myelopathy or radiculopathy, lumbar region 10/03/2017  . Abdominal pain, LUQ 05/02/2017  . Abdominal pain, LLQ 05/02/2017  . Hx of diarrhea 05/02/2017  . Bladder dysfunction 04/30/2014  . Nephrolithiasis 03/04/2014  . Agenesis, corpus callosum ([place]) 02/17/2014  . GAD (generalized anxiety disorder) 02/17/2014  . History of blood transfusion 02/17/2014    Past Medical history, Surgical history, Family history, Social history, Allergies and Medications have been entered into the medical record, reviewed and changed as  needed.    Past Surgical History:  Procedure Laterality Date  . ABDOMINAL HYSTERECTOMY N/A 08/15/2014   Procedure: HYSTERECTOMY ABDOMINAL WITH CYSTOSCOPY;  Surgeon: Sanjuana Kava, MD;  Location: [facility];  Service: Gynecology;  Laterality: N/A;  . CESAREAN SECTION N/A   . SHOULDER ARTHROSCOPY WITH SUBACROMIAL DECOMPRESSION AND OPEN ROTATOR C Left 11/27/2015   Procedure: LEFT SHOULDER ARTHROSCOPY WITH SUBACROMIAL DECOMPRESSION, MINI OPEN ROTATOR CUFF REPAIR;  Surgeon: Netta Cedars, MD;  Location: [facility];  Service: Orthopedics;  Laterality: Left;  . TUBAL LIGATION      Current Meds  Medication Sig  . chlorpheniramine-HYDROcodone (TUSSIONEX) 10-8 MG/5ML SUER Take 5 mLs by mouth every 12 (twelve) hours as needed for cough (cough, will cause drowsiness.).  Marland Kitchen diphenoxylate-atropine (LOMOTIL) 2.5-0.025 MG tablet Take 1 tablet by mouth 2 (two) times daily. Hold if constipated (Patient taking differently: Take 1 tablet by mouth 2 (two) times daily. Hold if constipated)  . ibuprofen (ADVIL,MOTRIN) 600 MG tablet Take 1 tablet (600 mg total) by mouth every 6 (six) hours as needed.  . metFORMIN (GLUCOPHAGE) 500 MG tablet Take 500 twice daily for 1 week then increase to 2 tablets twice daily as tol to goal BS    Allergies:  Allergies  Allergen Reactions  . Sulfa Antibiotics Anaphylaxis and Swelling  . Duloxetine Hcl Other (See Comments)    Weight gain, feels "crazy"  . Penicillins Rash    Has patient had a PCN reaction causing immediate rash, facial/tongue/throat swelling, SOB or lightheadedness with hypotension: Yes Has patient had a PCN reaction causing severe rash involving mucus membranes or skin necrosis: No Has patient had a PCN reaction that required hospitalization No Has patient had a PCN reaction occurring within the last 10 years: No If all of the above answers are "NO", then may proceed with Cephalosporin use.      Review of Systems: General:   Denies fever, chills, unexplained weight  loss.  Optho/Auditory:   Denies visual changes, blurred vision/LOV Respiratory:   Denies wheeze, DOE more than baseline levels.  Cardiovascular:   Denies chest pain, palpitations, new onset peripheral edema  Gastrointestinal:   Abdominal pain and nausea. Denies vomiting, diarrhea.  Genitourinary: Denies dysuria, freq/ urgency, flank pain or discharge from genitals.  Endocrine:     Denies hot or cold intolerance, polyuria, polydipsia. Musculoskeletal:   Denies unexplained myalgias, joint swelling, unexplained  arthralgias, gait problems.  Skin:  Denies new onset rash, suspicious lesions Neurological:     Denies dizziness, unexplained weakness, numbness  Psychiatric/Behavioral:   Denies mood changes, suicidal or homicidal ideations, hallucinations    Objective:   Blood pressure 136/84, pulse 89, height 5\' 9"  (1.753 m), weight 223 lb 12.8 oz (101.5 kg), last menstrual period 08/05/2014, SpO2 98 %. Body mass index is 33.05 kg/m. General:  Well Developed, well nourished, appropriate for stated age.  In no acute distress, looks her norm Neuro:  Alert and oriented,  extra-ocular muscles intact  HEENT:  Normocephalic, atraumatic, neck supple Skin:  no gross rash, warm, pink. Cardiac:  RRR, S1 S2, no M Respiratory:  ECTA B/L and A/P, Not using accessory muscles, speaking in full sentences- unlabored. Vascular:  Ext warm, no cyanosis apprec.; cap RF less 2 sec. Psych:  No HI/SI, judgement and insight good, Euthymic mood. Full Affect. Abdomen: Epigastric tenderness to deep palpation only.  Mild soreness to palpation to sternum and primarily at manubriosternal jt region.  BS WNL but decreased. No G/R/R.

## 2018-04-07 LAB — BILIRUBIN, FRACTIONATED(TOT/DIR/INDIR): Bilirubin, Direct: 0.06 mg/dL (ref 0.00–0.40)

## 2018-04-07 LAB — LIPID PANEL
Chol/HDL Ratio: 3.8 ratio (ref 0.0–4.4)
Cholesterol, Total: 143 mg/dL (ref 100–199)
HDL: 38 mg/dL — ABNORMAL LOW (ref >39)
LDL Calculated: 51 mg/dL (ref 0–99)
Triglycerides: 268 mg/dL — ABNORMAL HIGH (ref 0–149)
VLDL Cholesterol Cal: 54 mg/dL — ABNORMAL HIGH (ref 5–40)

## 2018-04-07 LAB — COMPREHENSIVE METABOLIC PANEL
ALT: 16 IU/L (ref 0–32)
AST: 9 IU/L (ref 0–40)
Albumin/Globulin Ratio: 1.4 (ref 1.2–2.2)
Albumin: 4.2 g/dL (ref 3.5–5.5)
Alkaline Phosphatase: 92 IU/L (ref 39–117)
BUN/Creatinine Ratio: 12 (ref 9–23)
BUN: 10 mg/dL (ref 6–20)
Bilirubin Total: <0.2 mg/dL (ref 0.0–1.2)
CO2: 25 mmol/L (ref 20–29)
Calcium: 9.6 mg/dL (ref 8.7–10.2)
Chloride: 98 mmol/L (ref 96–106)
Creatinine, Ser: 0.81 mg/dL (ref 0.57–1.00)
GFR calc Af Amer: 107 mL/min/{1.73_m2} (ref >59)
GFR calc non Af Amer: 92 mL/min/{1.73_m2} (ref >59)
Globulin, Total: 2.9 g/dL (ref 1.5–4.5)
Glucose: 166 mg/dL — ABNORMAL HIGH (ref 65–99)
Potassium: 4.3 mmol/L (ref 3.5–5.2)
Sodium: 138 mmol/L (ref 134–144)
Total Protein: 7.1 g/dL (ref 6.0–8.5)

## 2018-04-07 LAB — CBC WITH DIFFERENTIAL/PLATELET
Basophils Absolute: 0 10*3/uL (ref 0.0–0.2)
Basos: 0 %
EOS (ABSOLUTE): 0.2 10*3/uL (ref 0.0–0.4)
Eos: 2 %
Hematocrit: 38.5 % (ref 34.0–46.6)
Hemoglobin: 13 g/dL (ref 11.1–15.9)
Immature Grans (Abs): 0 10*3/uL (ref 0.0–0.1)
Immature Granulocytes: 0 %
Lymphocytes Absolute: 2.4 10*3/uL (ref 0.7–3.1)
Lymphs: 27 %
MCH: 27.5 pg (ref 26.6–33.0)
MCHC: 33.8 g/dL (ref 31.5–35.7)
MCV: 82 fL (ref 79–97)
Monocytes Absolute: 0.4 10*3/uL (ref 0.1–0.9)
Monocytes: 4 %
Neutrophils Absolute: 6 10*3/uL (ref 1.4–7.0)
Neutrophils: 67 %
Platelets: 204 10*3/uL (ref 150–379)
RBC: 4.72 x10E6/uL (ref 3.77–5.28)
RDW: 13.9 % (ref 12.3–15.4)
WBC: 9 10*3/uL (ref 3.4–10.8)

## 2018-04-07 LAB — TSH: TSH: 1.05 u[IU]/mL (ref 0.450–4.500)

## 2018-04-07 LAB — VITAMIN D 25 HYDROXY (VIT D DEFICIENCY, FRACTURES): Vit D, 25-Hydroxy: 24.8 ng/mL — ABNORMAL LOW (ref 30.0–100.0)

## 2018-04-07 LAB — GAMMA GT: GGT: 15 IU/L (ref 0–60)

## 2018-04-07 LAB — HEMOGLOBIN A1C
Est. average glucose Bld gHb Est-mCnc: 160 mg/dL
Hgb A1c MFr Bld: 7.2 % — ABNORMAL HIGH (ref 4.8–5.6)

## 2018-04-07 LAB — T4, FREE: Free T4: 1.34 ng/dL (ref 0.82–1.77)

## 2018-04-07 LAB — LIPASE: Lipase: 18 U/L (ref 14–72)

## 2018-04-09 ENCOUNTER — Telehealth: Payer: Self-pay | Admitting: Family Medicine

## 2018-04-09 LAB — H. PYLORI ANTIBODY, IGG: H. pylori, IgG AbS: <0.8 Index Value (ref 0.00–0.79)

## 2018-04-09 NOTE — Telephone Encounter (Signed)
Patient called states she would like someone to call her with results of her labs, she is unable to access information on Mychart.  ---Forwarding message to medical assistant to contact pt at [phone].  --glh

## 2018-04-09 NOTE — Telephone Encounter (Signed)
Advised pt that Dr. Raliegh Scarlet is going to discuss results with her at her [facility] on 04/11/18.  Pt expressed understanding and is agreeable.  Charyl Bigger, CMA

## 2018-04-10 DIAGNOSIS — M25571 Pain in right ankle and joints of right foot: Secondary | ICD-10-CM | POA: Diagnosis not present

## 2018-04-11 ENCOUNTER — Ambulatory Visit (INDEPENDENT_AMBULATORY_CARE_PROVIDER_SITE_OTHER): Payer: PPO | Admitting: Family Medicine

## 2018-04-11 ENCOUNTER — Encounter: Payer: Self-pay | Admitting: Family Medicine

## 2018-04-11 VITALS — BP 131/73 | HR 77 | Ht 69.0 in | Wt 225.0 lb

## 2018-04-11 DIAGNOSIS — E786 Lipoprotein deficiency: Secondary | ICD-10-CM | POA: Insufficient documentation

## 2018-04-11 DIAGNOSIS — E119 Type 2 diabetes mellitus without complications: Secondary | ICD-10-CM

## 2018-04-11 DIAGNOSIS — E781 Pure hyperglyceridemia: Secondary | ICD-10-CM | POA: Diagnosis not present

## 2018-04-11 DIAGNOSIS — E669 Obesity, unspecified: Secondary | ICD-10-CM

## 2018-04-11 DIAGNOSIS — E1169 Type 2 diabetes mellitus with other specified complication: Secondary | ICD-10-CM | POA: Insufficient documentation

## 2018-04-11 DIAGNOSIS — E559 Vitamin D deficiency, unspecified: Secondary | ICD-10-CM

## 2018-04-11 MED ORDER — VITAMIN D3 50 MCG (2000 UT) PO CAPS: 2000.0000 [IU] | ORAL_CAPSULE | Freq: Every day | ORAL | Status: DC

## 2018-04-11 NOTE — Progress Notes (Signed)
Assessment and plan:  1. Diet-controlled type 2 diabetes mellitus (HCC)   2. Obesity (BMI 30-39.9)   3. Hypertriglyceridemia   4. Low level of high density lipoprotein (HDL)   5. Vitamin D insufficiency     1. DM2 -A1c 04-06-18 was 7.2. This is worsened from prior 10 months ago where A1c was 6.9 on 02-27-18.  -Continue meds as listed below.  -Check your blood sugar both fasting and 2 hour post prandial. Do not check your BS randomly, unless you are experiencing symptoms. If you check your BS and they are in the 160s, call us and we will change your medication dose at that time.  -Prudent diet discussed. Reduce intake of carbs and sweets. Encouraged alternatives to certain carbs like rice or potatoes with cauliflower etc.  -Goal A1c: <6.5.  -Goal: fasting blood sugars <130  2. Obesity -Discussed using the Lose It app.  -Recommend losing weight and exercising daily.  3. HLD/HTG -reduce intake of saturated, trans fats, and fatty carbohydrates.  -AHA dietary and exercise guidelines discussed.  -Exercise regularly.  -Recent lab work showed TG's 268 and HDL 38. 8 years ago these were 48 and 61 respectively.  Vit D insufficiency -Start supplements OTC 2,000 IUs daily.   Education and routine counseling performed. Handouts provided.   Meds ordered this encounter  Medications  . Cholecalciferol (VITAMIN D3) 2000 units capsule    Sig: Take 1 capsule (2,000 Units total) by mouth daily.    Return in about 3 months (around 07/12/2018) for diabetes and wt loss follow up every 3 mo.  Anticipatory guidance and routine counseling done re: condition, txmnt options and need for follow up. All questions of patient's were answered.   Gross side effects, risk and benefits, and alternatives of medications discussed with patient.  Patient is aware that all medications have potential side effects and we are unable to predict every  sideeffect or drug-drug interaction that may occur.  Expresses verbal understanding and consents to current therapy plan and treatment regiment.  Please see AVS handed out to patient at the end of our visit for additional patient instructions/ counseling done pertaining to today's office visit.  Note: This document was prepared using Dragon voice recognition software and may include unintentional dictation errors.  This document serves as a record of services personally performed by [name], DO. It was created on her behalf by Mayer Masker, a trained medical scribe. The creation of this record is based on the scribe's personal observations and the provider's statements to them.   I have reviewed the above medical documentation for accuracy and completeness and I concur.  [name] 04/11/18 5:31 PM  ----------------------------------------------------------------------------------------------------------------------  Subjective:   CC:   Samantha [name] is a 37 y.o. female who presents to [place] [facility] today for review and discussion of recent bloodwork that was done.  1. All recent blood work that we ordered was reviewed with patient today.  Patient was counseled on all abnormalities and we discussed dietary and lifestyle changes that could help those values (also medications when appropriate).  Extensive health counseling performed and all patient's concerns/ questions were addressed.   Pt has GI appointment may 21 with [facility].   DM HPI:  -  She has been working on diet and exercise for diabetes  Pt is currently maintained on the following medications for diabetes:   see med list today  She states she was recently cleared by  her surgeon s/p a surgery on her foot. She has since started exercising.   She has been cutting down on her carbs (not all). She will swap out mashed potatoes for green beans. She   Medication compliance - yes  Home  glucose readings range : Her fasting blood sugars have been 121, 161, 113.  2 hour post prandial: 178, 204.    Denies polyuria/polydipsia.  Denies hypo/ hyperglycemia symptoms  Last diabetic eye exam was No results found for: HMDIABEYEEXA  Foot exam- UTD  Last A1C in the office was:  Lab Results  Component Value Date   HGBA1C 7.2 (H) 04/06/2018   HGBA1C 6.9 02/27/2018   HGBA1C 6.3 (H) 05/29/2017    Lab Results  Component Value Date   MICROALBUR 30 04/06/2018   LDLCALC 51 04/06/2018   CREATININE 0.81 04/06/2018    Wt Readings from Last 3 Encounters:  04/11/18 225 lb (102.1 kg)  04/06/18 223 lb 12.8 oz (101.5 kg)  02/27/18 230 lb (104.3 kg)    BP Readings from Last 3 Encounters:  04/11/18 131/73  04/06/18 136/84  02/27/18 138/86    Wt Readings from Last 3 Encounters:  04/11/18 225 lb (102.1 kg)  04/06/18 223 lb 12.8 oz (101.5 kg)  02/27/18 230 lb (104.3 kg)   BP Readings from Last 3 Encounters:  04/11/18 131/73  04/06/18 136/84  02/27/18 138/86   Pulse Readings from Last 3 Encounters:  04/11/18 77  04/06/18 89  02/27/18 96   BMI Readings from Last 3 Encounters:  04/11/18 33.23 kg/m  04/06/18 33.05 kg/m  02/27/18 33.97 kg/m     Patient Care Team    Relationship Specialty Notifications Start End  [name], DO PCP - General Family Medicine  09/29/16   Pedro Earls, MD Attending Physician Family Medicine  09/29/16   Carol Ada, MD Consulting Physician Gastroenterology  06/19/17   Marcial Pacas, MD Consulting Physician Neurology  08/24/17   Willia Craze, NP Nurse Practitioner Gastroenterology  04/06/18     Full medical history updated and reviewed in the office today  Patient Active Problem List   Diagnosis Date Noted  . Diet-controlled type 2 diabetes mellitus ([place]) 12/29/2015    Priority: High  . Obesity (BMI 30-39.9) 02/17/2014    Priority: High  . Acute folliculitis- likely due to MRSA, they are draining 06/19/2017     Priority: Medium  . Personal history of MRSA (methicillin resistant Staphylococcus aureus) 06/19/2017    Priority: Medium  . Environmental and seasonal allergies 09/29/2016    Priority: Medium  . GERD (gastroesophageal reflux disease) 09/29/2016    Priority: Medium  . Bipolar 1 disorder - Anxiety 09/28/2016    Priority: Medium  . Fatty liver 09/05/2014    Priority: Medium  . Chronic diarrhea  06/19/2017    Priority: Low  . Contact dermatitis and eczema- due to tape\ Band-Aid 06/19/2017    Priority: Low  . Fibromyalgia 08/11/2016    Priority: Low  . OSA on CPAP 12/08/2014    Priority: Low  . Obstructive sleep apnea syndrome, moderate 11/24/2014    Priority: Low  . S/P abdominal hysterectomy 08/15/2014    Priority: Low  . Hypertriglyceridemia 04/11/2018  . Low level of high density lipoprotein (HDL) 04/11/2018  . Vitamin D insufficiency 04/11/2018  . Epigastric abdominal pain with N 04/06/2018  . History of vitamin D deficiency 04/06/2018  . Spondylosis without myelopathy or radiculopathy, lumbar region 10/03/2017  . Abdominal pain, LUQ 05/02/2017  . Abdominal pain,  LLQ 05/02/2017  . Hx of diarrhea 05/02/2017  . Bladder dysfunction 04/30/2014  . Nephrolithiasis 03/04/2014  . Agenesis, corpus callosum ([place]) 02/17/2014  . GAD (generalized anxiety disorder) 02/17/2014  . History of blood transfusion 02/17/2014    Past Medical History:  Diagnosis Date  . Anemia   . Anxiety   . Depression    rx presc but not taken  . Diabetes mellitus without complication ([place])   . Fibromyalgia   . GERD (gastroesophageal reflux disease)    no meds  . Headache(784.0)    tension and with anxiety hx  . Hepatic steatosis   . History of kidney stones   . MVP (mitral valve prolapse)    echo 10  . Pneumonia    hx  . Seizures ([place])    non epileptic events per epilepsy monitoring unit  . Seizures ([place])    last seizure July 2015- does not convulse but has a sleep effect  . Sinus infection     recent on antibiotic  . Sleep apnea    has cpap does not use    Past Surgical History:  Procedure Laterality Date  . ABDOMINAL HYSTERECTOMY N/A 08/15/2014   Procedure: HYSTERECTOMY ABDOMINAL WITH CYSTOSCOPY;  Surgeon: Sanjuana Kava, MD;  Location: [facility];  Service: Gynecology;  Laterality: N/A;  . CESAREAN SECTION N/A   . SHOULDER ARTHROSCOPY WITH SUBACROMIAL DECOMPRESSION AND OPEN ROTATOR C Left 11/27/2015   Procedure: LEFT SHOULDER ARTHROSCOPY WITH SUBACROMIAL DECOMPRESSION, MINI OPEN ROTATOR CUFF REPAIR;  Surgeon: Netta Cedars, MD;  Location: [facility];  Service: Orthopedics;  Laterality: Left;  . TUBAL LIGATION      Social History   Tobacco Use  . Smoking status: Never Smoker  . Smokeless tobacco: Never Used  Substance Use Topics  . Alcohol use: No    Family Hx: Family History  Problem Relation Age of Onset  . Diabetes Mother   . Hypertension Mother   . Healthy Father   . Breast cancer Maternal Grandmother   . Cancer Maternal Grandfather        brain, lung, liver  . Heart attack Paternal Grandfather      Medications: Current Outpatient Medications  Medication Sig Dispense Refill  . blood glucose meter kit and supplies Dispense based on patient and insurance preference. Use to check blood glucose levels fasting in the morning and 2 hours after largest meal daily.  (FOR ICD-10 E10.9, E11.9). 1 each 0  . diphenoxylate-atropine (LOMOTIL) 2.5-0.025 MG tablet Take 1 tablet by mouth 2 (two) times daily. Hold if constipated (Patient taking differently: Take 1 tablet by mouth 2 (two) times daily. Hold if constipated) 60 tablet 2  . ibuprofen (ADVIL,MOTRIN) 600 MG tablet Take 1 tablet (600 mg total) by mouth every 6 (six) hours as needed. 30 tablet 0  . metFORMIN (GLUCOPHAGE) 500 MG tablet Take 500 twice daily for 1 week then increase to 2 tablets twice daily as tol to goal BS 360 tablet 0  . omeprazole (PRILOSEC) 20 MG capsule Take 1 capsule (20 mg total) by mouth 2 (two) times  daily before a meal. 60 capsule 0  . ondansetron (ZOFRAN-ODT) 8 MG disintegrating tablet Take 1 tablet (8 mg total) by mouth every 8 (eight) hours as needed for nausea. 20 tablet 1  . ranitidine (ZANTAC) 300 MG tablet Take 1 tablet (300 mg total) by mouth 2 (two) times daily. 60 tablet 0  . Cholecalciferol (VITAMIN D3) 2000 units capsule Take 1 capsule (2,000 Units total) by mouth daily.  No current facility-administered medications for this visit.     Allergies:  Allergies  Allergen Reactions  . Sulfa Antibiotics Anaphylaxis and Swelling  . Duloxetine Hcl Other (See Comments)    Weight gain, feels "crazy"  . Penicillins Rash    Has patient had a PCN reaction causing immediate rash, facial/tongue/throat swelling, SOB or lightheadedness with hypotension: Yes Has patient had a PCN reaction causing severe rash involving mucus membranes or skin necrosis: No Has patient had a PCN reaction that required hospitalization No Has patient had a PCN reaction occurring within the last 10 years: No If all of the above answers are "NO", then may proceed with Cephalosporin use.      Review of Systems: General:   No F/C, wt loss Pulm:   No DIB, SOB, pleuritic chest pain Card:  No CP, palpitations Abd:  No n/v/d or pain Ext:  No inc edema from baseline  Objective:  Blood pressure 131/73, pulse 77, height '5\' 9"'$  (1.753 m), weight 225 lb (102.1 kg), last menstrual period 08/05/2014, SpO2 98 %. Body mass index is 33.23 kg/m. Gen:   Well NAD, A and O *3 HEENT:    [place]/AT, EOMI,  MMM Lungs:   Normal work of breathing. CTA B/L, no Wh, rhonchi Heart:   RRR, S1, S2 WNL's, no MRG Abd:   No gross distention Exts:    warm, pink,  Brisk capillary refill, warm and well perfused.  Psych:    No HI/SI, judgement and insight good, Euthymic mood. Full Affect.   Recent Results (from the past 2160 hour(s))  POCT glycosylated hemoglobin (Hb A1C)     Status: Abnormal   Collection Time: 02/27/18  3:01 PM    Result Value Ref Range   Hemoglobin A1C 6.9   POCT urinalysis dipstick     Status: Abnormal   Collection Time: 04/06/18  9:44 AM  Result Value Ref Range   Color, UA YELLOW    Clarity, UA CLEAR    Glucose, UA NEGATIVE    Bilirubin, UA NEGATIVE    Ketones, UA NEGATIVE    Spec Grav, UA 1.025 1.010 - 1.025   Blood, UA TRACE-LYSED    pH, UA 5.5 5.0 - 8.0   Protein, UA NEGATIVE    Urobilinogen, UA 0.2 0.2 or 1.0 E.U./dL   Nitrite, UA NEGATIVE    Leukocytes, UA Negative Negative   Appearance     Odor    POCT UA - Microalbumin     Status: Normal   Collection Time: 04/06/18  9:45 AM  Result Value Ref Range   Microalbumin Ur, POC 30 mg/L   Creatinine, POC 200 mg/dL   Albumin/Creatinine Ratio, Urine, POC <30   CBC with Differential/Platelet     Status: None   Collection Time: 04/06/18  9:55 AM  Result Value Ref Range   WBC 9.0 3.4 - 10.8 x10E3/uL   RBC 4.72 3.77 - 5.28 x10E6/uL   Hemoglobin 13.0 11.1 - 15.9 g/dL   Hematocrit 38.5 34.0 - 46.6 %   MCV 82 79 - 97 fL   MCH 27.5 26.6 - 33.0 pg   MCHC 33.8 31.5 - 35.7 g/dL   RDW 13.9 12.3 - 15.4 %   Platelets 204 150 - 379 x10E3/uL   Neutrophils 67 Not Estab. %   Lymphs 27 Not Estab. %   Monocytes 4 Not Estab. %   Eos 2 Not Estab. %   Basos 0 Not Estab. %   Neutrophils Absolute 6.0 1.4 - 7.0 x10E3/uL  Lymphocytes Absolute 2.4 0.7 - 3.1 x10E3/uL   Monocytes Absolute 0.4 0.1 - 0.9 x10E3/uL   EOS (ABSOLUTE) 0.2 0.0 - 0.4 x10E3/uL   Basophils Absolute 0.0 0.0 - 0.2 x10E3/uL   Immature Granulocytes 0 Not Estab. %   Immature Grans (Abs) 0.0 0.0 - 0.1 x10E3/uL  Comprehensive metabolic panel     Status: Abnormal   Collection Time: 04/06/18  9:55 AM  Result Value Ref Range   Glucose 166 (H) 65 - 99 mg/dL   BUN 10 6 - 20 mg/dL   Creatinine, Ser 0.81 0.57 - 1.00 mg/dL   GFR calc non Af Amer 92 >59 mL/min/1.73   GFR calc Af Amer 107 >59 mL/min/1.73   BUN/Creatinine Ratio 12 9 - 23   Sodium 138 134 - 144 mmol/L   Potassium 4.3 3.5 -  5.2 mmol/L   Chloride 98 96 - 106 mmol/L   CO2 25 20 - 29 mmol/L   Calcium 9.6 8.7 - 10.2 mg/dL   Total Protein 7.1 6.0 - 8.5 g/dL   Albumin 4.2 3.5 - 5.5 g/dL   Globulin, Total 2.9 1.5 - 4.5 g/dL   Albumin/Globulin Ratio 1.4 1.2 - 2.2   Bilirubin Total <0.2 0.0 - 1.2 mg/dL   Alkaline Phosphatase 92 39 - 117 IU/L   AST 9 0 - 40 IU/L   ALT 16 0 - 32 IU/L  Hemoglobin A1c     Status: Abnormal   Collection Time: 04/06/18  9:55 AM  Result Value Ref Range   Hgb A1c MFr Bld 7.2 (H) 4.8 - 5.6 %    Comment:          Prediabetes: 5.7 - 6.4          Diabetes: >6.4          Glycemic control for adults with diabetes: <7.0    Est. average glucose Bld gHb Est-mCnc 160 mg/dL  Lipid panel     Status: Abnormal   Collection Time: 04/06/18  9:55 AM  Result Value Ref Range   Cholesterol, Total 143 100 - 199 mg/dL   Triglycerides 268 (H) 0 - 149 mg/dL   HDL 38 (L) >39 mg/dL   VLDL Cholesterol Cal 54 (H) 5 - 40 mg/dL   LDL Calculated 51 0 - 99 mg/dL   Chol/HDL Ratio 3.8 0.0 - 4.4 ratio    Comment:                                   T. Chol/HDL Ratio                                             Men  Women                               1/2 Avg.Risk  3.4    3.3                                   Avg.Risk  5.0    4.4  2X Avg.Risk  9.6    7.1                                3X Avg.Risk 23.4   11.0   TSH     Status: None   Collection Time: 04/06/18  9:55 AM  Result Value Ref Range   TSH 1.050 0.450 - 4.500 uIU/mL  VITAMIN D 25 Hydroxy (Vit-D Deficiency, Fractures)     Status: Abnormal   Collection Time: 04/06/18  9:55 AM  Result Value Ref Range   Vit D, 25-Hydroxy 24.8 (L) 30.0 - 100.0 ng/mL    Comment: Vitamin D deficiency has been defined by the [place] and an Endocrine Society practice guideline as a level of serum 25-OH vitamin D less than 20 ng/mL (1,2). The Endocrine Society went on to further define vitamin D insufficiency as a level between 21 and  29 ng/mL (2). 1. IOM (Institute of Medicine). 2010. Dietary reference    intakes for calcium and D. [place]: The    [employer]. 2. Holick MF, Binkley , Bischoff-Ferrari HA, et al.    Evaluation, treatment, and prevention of vitamin D    deficiency: an Endocrine Society clinical practice    guideline. JCEM. 2011 Jul; 96(7):1911-30.   T4, free     Status: None   Collection Time: 04/06/18  9:55 AM  Result Value Ref Range   Free T4 1.34 0.82 - 1.77 ng/dL  Gamma GT     Status: None   Collection Time: 04/06/18  9:55 AM  Result Value Ref Range   GGT 15 0 - 60 IU/L  Bilirubin, fractionated(tot/dir/indir)     Status: None   Collection Time: 04/06/18  9:55 AM  Result Value Ref Range   Bilirubin, Direct 0.06 0.00 - 0.40 mg/dL   Bilirubin, Indirect <.14 0.10 - 0.80 mg/dL  Lipase     Status: None   Collection Time: 04/06/18  9:55 AM  Result Value Ref Range   Lipase 18 14 - 72 U/L  H. pylori antibody, IgG     Status: None   Collection Time: 04/06/18  9:59 AM  Result Value Ref Range   H. pylori, IgG AbS <0.80 0.00 - 0.79 Index Value    Comment:                              Negative           <0.80                              Equivocal    0.80 - 0.89                              Positive           >0.89

## 2018-04-11 NOTE — Patient Instructions (Signed)
-Please continue to monitor your fasting as well as 2-hour postprandial blood sugars.  Please keep a follow-up for 3 months from today.  This will be to continue to monitor your diabetes chronic conditions as well as monitor you for weight loss.    The quick and dirty--> lower triglyceride levels more through...  1) - Beware of bad fats: Cutting back on saturated fat (in red meat and full-fat dairy foods) and trans fats (in restaurant fried foods and commercially prepared baked goods) can lower triglycerides.  2) - Go for good carbs: Easily digested carbohydrates (such as white bread, white rice, cornflakes, and sugary sodas) give triglycerides a definite boost.   3) - Eating whole grains and cutting back on soda can help control triglycerides.  4) - Check your alcohol use. In some people, alcohol dramatically boosts triglycerides. The only way to know if this is true for you is to avoid alcohol for a few weeks and have your triglycerides tested again.  5) - Go fish. Omega-3 fats in salmon, tuna, sardines, and other fatty fish can lower triglycerides. Having fish twice a week is fine.  6) - Aim for a healthy weight. If you are overweight, losing just 5% to 10% of your weight can help drive down triglycerides.  7) - Get moving. Exercise lowers triglycerides and boosts heart-healthy HDL cholesterol.  8) - quit smoking if you do  --> for more information, see below; or go to  [email or web address]  and do a search for desired topics   For those diagnosed with high triglycerides, it's important to take action to lower your levels and improve your heart health.  Triglyceride is just a fancy word for fat - the fat in our bodies is stored in the form of triglycerides. Triglycerides are found in foods and manufactured in our bodies.  Normal triglyceride levels are defined as less than 150 mg/dL; 150 to 199 is considered borderline high; 200 to 499 is high; and 500 or higher is officially called very  high. To me, anything over 150 is a red flag indicating my patient needs to take immediate steps to get the situation under control.   What is the significance of high triglycerides? High triglyceride levels make blood thicker and stickier, which means that it is more likely to form clots. Studies have shown that triglyceride levels are associated with increased risks of cardiovascular disease and stroke - in both men and women - alone or in combination with other risk factors (high triglycerides combined with high LDL cholesterol can be a particularly deadly combination). For example, in one ground-breaking study, high triglycerides alone increased the risk of cardiovascular disease by 14 percent in men, and by 73 percent in women. But when the test subjects also had low HDL cholesterol (that's the good cholesterol) and other risk factors, high triglycerides increased the risk of disease by 32 percent in men and 76 percent in women.   Fortunately, triglycerides can sometimes be controlled with several diet and lifestyle changes.    What Factors Can Increase Triglycerides? As with cholesterol, eating too much of the wrong kinds of fats will raise your blood triglycerides.  Therefore, it's important to restrict the amounts of saturated fats and trans fats you allow into your diet.  Triglyceride levels can also shoot up after eating foods that are high in carbohydrates or after drinking alcohol.  That's why triglyceride blood tests require an overnight fast.  If you have elevated triglycerides, it's especially important to avoid  sugary and refined carbohydrates, including sugar, honey, and other sweeteners, soda and other sugary drinks, candy, baked goods, and anything made with white (refined or enriched) flour, including white bread, rolls, cereals, buns, pastries, regular pasta, and white rice.  You'll also want to limit dried fruit and fruit juice since they're dense in simple sugar.  All of these  low-quality carbs cause a sudden rise in insulin, which may lead to a spike in triglycerides.  Triglycerides can also become elevated as a reaction to having diabetes, hypothyroidism, or kidney disease. As with most other heart-related factors, being overweight and inactive also contribute to abnormal triglycerides. And unfortunately, some people have a genetic predisposition that causes them to manufacture way too much triglycerides on their own, no matter how carefully they eat.     How Can You Lower Your Triglyceride Levels? If you are diagnosed with high triglycerides, it's important to take action. There are several things you can do to help lower your triglyceride levels and improve your heart health:  --> Lose weight if you are overweight.  There is a clear correlation between obesity and high triglycerides - the heavier people are, the higher their triglyceride levels are likely to be. The good news is that losing weight can significantly lower triglycerides. In a large study of individuals with type 2 diabetes, those assigned to the "lifestyle intervention group" - which involved counseling, a low-calorie meal plan, and customized exercise program - lost 8.6% of their body weight and lowered their triglyceride levels by more than 16%. If you're overweight, find a weight loss plan that works for you and commit to shedding the pounds and getting healthier.  --> Reduce the amount of saturated fat and trans fat in your diet.  Start by avoiding or dramatically limiting butter, cream cheese, lard, sour cream, doughnuts, cakes, cookies, candy bars, regular ice cream, fried foods, pizza, cheese sauce, cream-based sauces and salad dressings, high-fat meats (including fatty hamburgers, bologna, pepperoni, sausage, bacon, salami, pastrami, spareribs, and hot dogs), high-fat cuts of beef and pork, and whole-milk dairy products.   Other ways to cut back: Choose lean meats only (including skinless  chicken and [place], lean beef, lean pork), fish, and reduced-fat or fat-free dairy products.   Experiment with adding whole soy foods to your diet. Although soy itself may not reduce risk of heart disease, it replaces hazardous animal fats with healthier proteins. Choose high-quality soy foods, such as tofu, tempeh, soy milk, and edamame (whole soybeans).  Always remove skin from poultry.  Prepare foods by baking, roasting, broiling, boiling, poaching, steaming, grilling, or stir-frying in vegetable oil.  Most stick margarines contain trans fats, and trans fats are also found in some packaged baked goods, potato chips, snack foods, fried foods, and fast food that use or create hydrogenated oils.    (All food labels must now list the amount of trans fats, right after the amount of saturated fats - good news for consumers. As a result, many food companies have now reformulated their products to be trans fat free.many, but not all! So it's still just as important to read labels and make sure the packaged foods you buy don't contain trans fats.)     If you use margarine, purchase soft-tub margarine spreads that contain 0 grams trans fats and don't list any partially hydrogenated oils in the ingredients list. By substituting olive oil or vegetable oil for trans fats in just 2 percent of your daily calories, you can reduce your risk of  heart disease by 53 percent.   There is no safe amount of trans fats, so try to keep them as far from your plate as possible.  -->  Avoid foods that are concentrated in sugar (even dried fruit and fruit juice). Sugary foods can elevate triglyceride levels in the blood, so keep them to a bare minimum.  --> Swap out refined carbohydrates for whole grains.  Refined carbohydrates - like white rice, regular pasta, and anything made with white or "enriched" flour (including white bread, rolls, cereals, buns, and crackers) - raise blood sugar and insulin levels more than fiber-rich  whole grains. Higher insulin levels, in turn, can lead to a higher rise in triglycerides after a meal. So, make the switch to whole wheat bread, whole grain pasta, brown or wild rice, and whole grain versions of cereals, crackers, and other bread products. However, it's important to know that individuals with high triglycerides should moderate even their intake of high-quality starches (since all starches raise blood sugar) - I recommend 1 to 2 servings per meal.  --> Cut way back on alcohol.  If you have high triglycerides, alcohol should be considered a rare treat - if you indulge at all, since even small amounts of alcohol can dramatically increase triglyceride levels.  --> Incorporate omega-3 fats.  Heart-healthy fish oils are especially rich in omega-3 fatty acids. In multiple studies over the past two decades, people who ate diets high in omega-3s had 30 to 40 percent reductions in heart disease. Although we don't yet know why fish oil works so well, there are several possibilities. Omega-3s seem to reduce inflammation, reduce high blood pressure, decrease triglycerides, raise HDL cholesterol, and make blood thinner and less sticky so it is less likely to clot. It's as close to a food prescription for heart health as it gets. If you have high triglycerides, I recommend eating at least three servings of one of the omega-3-rich fish every week (fatty fish is the most concentrated food form of omega three fats). If you cannot manage to eat that much fish, speak with your physician about taking fish oil capsules, which offer similar benefits.The best foods for omega-3 fatty acids include wild salmon (fresh, canned), herring, mackerel (not Reta), sardines, anchovies, rainbow trout, and Pacific oysters. Non-fish sources of omega-3 fats include omega-3-fortified eggs, ground flaxseed, chia seeds, walnuts, butternuts (white walnuts), seaweed, walnut oil, canola oil, and soybeans.  --> Quit smoking.  Smoking  causes inflammation, not just in your lungs, but throughout your body. Inflammation can contribute to atherosclerosis, blood clots, and risk of heart attack. Smoking makes all heart health indicators worse. If you have high cholesterol, high triglycerides, or high blood pressure, smoking magnifies the danger.  --> Become more physically active.  Even moderate exercise can help improve cholesterol, triglycerides, and blood pressure. Aerobic exercise seems to be able to stop the sharp rise of triglycerides after eating, perhaps because of a decrease in the amount of triglyceride released by the liver, or because active muscle clears triglycerides out of the blood stream more quickly than inactive muscle. If you haven't exercised regularly (or at all) for years, I recommend starting slowly, by walking at an easy pace for 15 minutes a day. Then, as you feel more comfortable, increase the amount. Your ultimate goal should be at least 30 minutes of moderate physical activity, at least five days a week.     Nine ways to increase your "good" HDL cholesterol  High-density lipoprotein (HDL) is often referred  to as the "good" cholesterol. Having high HDL levels helps carry cholesterol from your arteries to your liver, where it can be used or excreted.  Having high levels of HDL also has antioxidant and anti-inflammatory effects, and is linked to a reduced risk of heart disease (1, 2).  Most health experts recommend minimum blood levels of 40 mg/dl in men and 50 mg/dl in women.  While genetics definitely play a role, there are several other factors that affect HDL levels.  Here are nine healthy ways to raise your "good" HDL cholesterol.  1. Consume olive oil  two pieces of salmon on a plate olive oil being poured into a small dish Extra virgin olive oil may be more healthful than processed olive oils. Olive oil is one of the healthiest fats around.  A large analysis of 42 studies with more than  800,000 participants found that olive oil was the only source of monounsaturated fat that seemed to reduce heart disease risk (3).  Research has shown that one of olive oil's heart-healthy effects is an increase in HDL cholesterol. This effect is thought to be caused by antioxidants it contains called polyphenols (4, 5, 6, 7).  Extra virgin olive oil has more polyphenols than more processed olive oils, although the amount can still vary among different types and brands.  One study gave 200 healthy young men about 2 tablespoons (25 ml) of different olive oils per day for three weeks.  The researchers found that participants' HDL levels increased significantly more after they consumed the olive oil with the highest polyphenol content (6).  In another study, when 15 older adults consumed about 4 tablespoons (50 ml) of high-polyphenol extra virgin olive oil every day for six weeks, their HDL cholesterol increased by 6.5 mg/dl, on average (7).  In addition to raising HDL levels, olive oil has been found to boost HDL's anti-inflammatory and antioxidant function in studies of older people and individuals with high cholesterol levels ( 7, 8, 9).  Whenever possible, select high-quality, certified extra virgin olive oils, which tend to be highest in polyphenols.  Bottom line: Extra virgin olive oil with a high polyphenol content has been shown to increase HDL levels in healthy people, the elderly and individuals with high cholesterol.  2. Follow a low-carb or ketogenic diet  Low-carb and ketogenic diets provide a number of health benefits, including weight loss and reduced blood sugar levels.  They have also been shown to increase HDL cholesterol in people who tend to have lower levels.  This includes those who are obese, insulin resistant or diabetic (10, 11, 12, 13, 14, 15, 16, 17).  In one study, people with type 2 diabetes were split into two groups.  One followed a diet consuming less than 50  grams of carbs per day. The other followed a high-carb diet.  Although both groups lost weight, the low-carb group's HDL cholesterol increased almost twice as much as the high-carb group's did (14).  In another study, obese people who followed a low-carb diet experienced an increase in HDL cholesterol of 5 mg/dl overall.  Meanwhile, in the same study, the participants who ate a low-fat, high-carb diet showed a decrease in HDL cholesterol (15).  This response may partially be due to the higher levels of fat people typically consume on low-carb diets.  One study in overweight women found that diets high in meat and cheese increased HDL levels by 5-8%, compared to a higher-carb diet (18).  What's more, in addition to  raising HDL cholesterol, very-low-carb diets have been shown to decrease triglycerides and improve several other risk factors for heart disease (13, 14, 16, 17).  Bottom line: Low-carb and ketogenic diets typically increase HDL cholesterol levels in people with diabetes, metabolic syndrome and obesity.  3. Exercise regularly  Being physically active is important for heart health.  Studies have shown that many different types of exercise are effective at raising HDL cholesterol, including strength training, high-intensity exercise and aerobic exercise (19, 20, 21, 22, 23, 24).  However, the biggest increases in HDL are typically seen with high-intensity exercise.  One small study followed women who were living with polycystic ovary syndrome (PCOS), which is linked to a higher risk of insulin resistance. The study required them to perform high-intensity exercise three times a week.  The exercise led to an increase in HDL cholesterol of 8 mg/dL after 10 weeks. The women also showed improvements in other health markers, including decreased insulin resistance and improved arterial function (23).  In a 12-week study, overweight men who performed high-intensity exercise experienced a  10% increase in HDL cholesterol.  In contrast, the low-intensity exercise group showed only a 2% increase and the endurance training group experienced no change (24).  However, even lower-intensity exercise seems to increase HDL's anti-inflammatory and antioxidant capacities, whether or not HDL levels change (20, 21, 25).  Overall, high-intensity exercise such as high-intensity interval training (HIIT) and high-intensity circuit training (HICT) may boost HDL cholesterol levels the most.  Bottom line: Exercising several times per week can help raise HDL cholesterol and enhance its anti-inflammatory and antioxidant effects. High-intensity forms of exercise may be especially effective.  4. Add coconut oil to your diet  Studies have shown that coconut oil may reduce appetite, increase metabolic rate and help protect brain health, among other benefits.  Some people may be concerned about coconut oil's effects on heart health due to its high saturated fat content.  However, it appears that coconut oil is actually quite heart healthy.  Coconut oil tends to raise HDL cholesterol more than many other types of fat.  In addition, it may improve the ratio of low-density-lipoprotein (LDL) cholesterol, the "bad" cholesterol, to HDL cholesterol. Improving this ratio reduces heart disease risk (26, 27, 28, 29).  One study examined the health effects of coconut oil on 12 women with excess belly fat. The researchers found that participants who took coconut oil daily experienced increased HDL cholesterol and a lower LDL-to-HDL ratio.  In contrast, the group who took soybean oil daily had a decrease in HDL cholesterol and an increase in the LDL-to-HDL ratio (29).  Most studies have found these health benefits occur at a dosage of about 2 tablespoons (30 ml) of coconut oil per day. It's best to incorporate this into cooking rather than eating spoonfuls of coconut oil on their own.  Bottom line: Consuming 2  tablespoons (30 ml) of coconut oil per day may help increase HDL cholesterol levels.  5. Stop smoking  cigarette butt Quitting smoking can reduce the risk of heart disease and lung cancer. Smoking increases the risk of many health problems, including heart disease and lung cancer (30).  One of its negative effects is a suppression of HDL cholesterol.  Some studies have found that quitting smoking can increase HDL levels. Indeed, one study found no significant differences in HDL levels between former smokers and people who had never smoked (31, 32, 33, 34, 35).  In a one-year study of more than 1,500 people, those  who quit smoking had twice the increase in HDL as those who resumed smoking within the year. The number of large HDL particles also increased, which further reduced heart disease risk (32).  One study followed smokers who switched from traditional cigarettes to electronic cigarettes for one year. They found that the switch was associated with an increase in HDL cholesterol of 5 mg/dl, on average (33).  When it comes to the effect of nicotine replacement patches on HDL levels, research results have been mixed.  One study found that nicotine replacement therapy led to higher HDL cholesterol. However, other research suggests that people who use nicotine patches likely won't see increases in HDL levels until after replacement therapy is completed (34, 36).  Even in studies where HDL cholesterol levels didn't increase after people quit smoking, HDL function improved, resulting in less inflammation and other beneficial effects on heart health (37).  Bottom line: Quitting smoking can increase HDL levels, improve HDL function and help protect heart health.  6. Lose weight  When overweight and obese people lose weight, their HDL cholesterol levels usually increase.  What's more, this benefit seems to occur whether weight loss is achieved by calorie counting, carb restriction, intermittent  fasting, weight loss surgery or a combination of diet and exercise (16, 38, 39, 40, 41, 42).  One study examined HDL levels in more than 3,000 overweight and obese [place] adults who followed a lifestyle modification program for one year.  The researchers found that losing at least 6.6 lbs (3 kg) led to an increase in HDL cholesterol of 4 mg/dl, on average (41).  In another study, when obese people with type 2 diabetes consumed calorie-restricted diets that provided 20-30% of calories from protein, they experienced significant increases in HDL cholesterol levels (42).  The key to achieving and maintaining healthy HDL cholesterol levels is choosing the type of diet that makes it easiest for you to lose weight and keep it off.  Bottom Line: Several methods of weight loss have been shown to increase HDL cholesterol levels in people who are overweight or obese.  7. Choose purple produce  Consuming purple-colored fruits and vegetables is a delicious way to potentially increase HDL cholesterol.  Purple produce contains antioxidants known as anthocyanins.  Studies using anthocyanin extracts have shown that they help fight inflammation, protect your cells from damaging free radicals and may also raise HDL cholesterol levels (43, 44, 45, 46).  In a 24-week study of 83 people with diabetes, those who took an anthocyanin supplement twice a day experienced a 19% increase in HDL cholesterol, on average, along with other improvements in heart health markers (45).  In another study, when people with cholesterol issues took anthocyanin extract for 12 weeks, their HDL cholesterol levels increased by 13.7% (46).  Although these studies used extracts instead of foods, there are several fruits and vegetables that are very high in anthocyanins. These include eggplant, purple corn, red cabbage, blueberries, blackberries and black raspberries.  Bottom line: Consuming fruits and vegetables rich in anthocyanins  may help increase HDL cholesterol levels.  8. Eat fatty fish often  The omega-3 fats in fatty fish provide major benefits to heart health, including a reduction in inflammation and better functioning of the cells that line your arteries (47, 48).  There's some research showing that eating fatty fish or taking fish oil may also help raise low levels of HDL cholesterol (49, 50, 51, 52, 53).  In a study of 33 heart disease patients, participants that consumed  fatty fish four times per week experienced an increase in HDL cholesterol levels. The particle size of their HDL also increased (52).  In another study, overweight men who consumed herring five days a week for six weeks had a 5% increase in HDL cholesterol, compared with their levels after eating lean pork and chicken five days a week (53).  However, there are a few studies that found no increase in HDL cholesterol in response to increased fish or omega-3 supplement intake (54, 55).  In addition to herring, other types of fatty fish that may help raise HDL cholesterol include salmon, sardines, mackerel and anchovies.  Bottom line: Eating fatty fish several times per week may help increase HDL cholesterol levels and provide other benefits to heart health.  9. Avoid artificial trans fats  Artificial trans fats have many negative health effects due to their inflammatory properties (56, 57).  There are two types of trans fats. One kind occurs naturally in animal products, including full-fat dairy.  In contrast, the artificial trans fats found in margarines and processed foods are created by adding hydrogen to unsaturated vegetable and seed oils. These fats are also known as industrial trans fats or partially hydrogenated fats.  Research has shown that, in addition to increasing inflammation and contributing to several health problems, these artificial trans fats may lower HDL cholesterol levels.  In one study, researchers compared how  people's HDL levels responded when they consumed different margarines.  The study found that participants' HDL cholesterol levels were 10% lower after consuming margarine containing partially hydrogenated soybean oil, compared to their levels after consuming palm oil (58).  Another controlled study followed 40 adults who had diets high in different types of trans fats.  They found that HDL cholesterol levels in women were significantly lower after they consumed the diet high in industrial trans fats, compared to the diet containing naturally occurring trans fats (59).  To protect heart health and keep HDL cholesterol in the healthy range, it's best to avoid artificial trans fats altogether.  Bottom line: Artificial trans fats have been shown to lower HDL levels and increase inflammation, compared to other fats.  Take home message  Although your HDL cholesterol levels are partly determined by your genetics, there are many things you can do to naturally increase your own levels.  Fortunately, the practices that raise HDL cholesterol often provide other health benefits as well.

## 2018-04-20 NOTE — Telephone Encounter (Signed)
error 

## 2018-04-24 ENCOUNTER — Ambulatory Visit (INDEPENDENT_AMBULATORY_CARE_PROVIDER_SITE_OTHER): Payer: PPO | Admitting: Nurse Practitioner

## 2018-04-24 ENCOUNTER — Ambulatory Visit: Payer: PPO | Admitting: Nurse Practitioner

## 2018-04-24 ENCOUNTER — Encounter: Payer: Self-pay | Admitting: Nurse Practitioner

## 2018-04-24 VITALS — BP 110/76 | HR 88 | Ht 69.5 in | Wt 222.0 lb

## 2018-04-24 DIAGNOSIS — R109 Unspecified abdominal pain: Secondary | ICD-10-CM | POA: Diagnosis not present

## 2018-04-24 DIAGNOSIS — G8929 Other chronic pain: Secondary | ICD-10-CM | POA: Diagnosis not present

## 2018-04-24 DIAGNOSIS — K760 Fatty (change of) liver, not elsewhere classified: Secondary | ICD-10-CM

## 2018-04-24 MED ORDER — DICYCLOMINE HCL 20 MG PO TABS: 20.0000 mg | ORAL_TABLET | Freq: Two times a day (BID) | ORAL | 3 refills | Status: DC

## 2018-04-24 NOTE — Progress Notes (Addendum)
IMPRESSION and PLAN:    #1. 37 yo female with chronic abdominal pain, multiple sites. CT scan abd/pelvis in 2015 and in June 2018 for abdominal pain were negative (except for steatosis). EGD and colonoscopies with biopsies by Dr. Benson Norway in 2016 were unremarkable. RUQ u/s Aug 2018 unremarkable except for hepatic steatosis    -Difficult to sort out her pain as she has different types of pain in several areas of the abdomen at times.  I suspect she has functional abdominal pain. She gets frustrated with the frequency of daily BMs (sometimes up to 7 formed BMs a day). I'm going to try her on dicyclomine BID for abdominal pain but it may also help to slow down frequency of BMs as well.   -Continue PPI recently started for GERD sx,  it may help with some of her upper pain. -Will bring patient back in a couple of months to re-evaluate abdominal pain. At this point I don't think further testing is needed   #2. Hepatic steatosis, per CT scan one year ago. She had several questions about fatty liver disease. We discussed risk factors for it, mainstays of treatment, and potential to evolve into cirrhosis. Her liver chemistries are normal.  -Discussed beneficial effects of weight loss and good glycemic control on hepatic steatosis. She doesn't drink ETOH which is a plus.  -Will defer to PCP but lowering triglyceride level would be beneficial. Triglycerides 268.    Addendum: Reviewed and agree with initial management. Pyrtle, Lajuan Lines, MD        HPI:    Chief Complaint: abdominal pain   Patient is a 37 yo female with DM2, fibromyalgia, obesity, and hepatic steatosis. She was evaluated here last year for RUQ pain.  CT scan prior to appt was unrevealing and she had been evaluated a couple of years earlier by Dr. Benson Norway and EGD / colonoscopy were unremarkable. Patient was worried about her gb so RUQ obtained and remarkable only for hepatic steatosis. Laasia has returned for evaluation of abdominal pain,  specifically LUQ but upon further questioning she has multiple areas of abdominal pain.   serum chemistries drawn 04/06/2018: Normal except for glucose of 166. CBC normal.  TSH normal.  Ethelreda describes episodic LUQ cramp like pain. Episodes last 1-2 hours hours. Sometimes takes tylenol or advil for the pain,. The only aggravating factors are bending, twisting. Pain not related to eating or BMs or eating. No associated ausea / vomiting On Saturday Shonteria had two hours of non-radiating RUQ abdominal pain. Pain was not related to eating. Pain similar to what she has experienced in the past. Sometimes follow a BM she gets diffuse lower abdominal pain lasting for about an hours. She frequently gets diffuse lower abdominal pain following defecation. She has several formed BMs a day which is her norm. Stools sometimes prickly like a stick and uncomfortable coming out. Tried a stool softener but it gave her diarrhea.  Though she can have up to 7 BMs a day that is far less than it used to be.  Recently started on BID omeprazole recently for what sounds like globus sensation and symptoms have improved.    Weight is down 8 pounds, partly intentional.  She has occasional abdominal bloating, especially with cabbage. .    Review of systems:    No chest pain, no SOB, no urinary sx   Past Medical History:  Diagnosis Date  . Anemia   . Anxiety   . Depression  rx presc but not taken  . Diabetes mellitus without complication ([place])   . Fibromyalgia   . GERD (gastroesophageal reflux disease)    no meds  . Headache(784.0)    tension and with anxiety hx  . Hepatic steatosis   . History of kidney stones   . MVP (mitral valve prolapse)    echo 10  . Pneumonia    hx  . Seizures ([place])    non epileptic events per epilepsy monitoring unit  . Seizures ([place])    last seizure July 2015- does not convulse but has a sleep effect  . Sinus infection    recent on antibiotic  . Sleep apnea    has cpap does not  use    Patient's surgical history, family medical history, social history, medications and allergies were all reviewed in Epic    Physical Exam:     BP 110/76   Pulse 88   Ht 5' 9.5" (1.765 m)   Wt 222 lb (100.7 kg)   LMP 08/05/2014 (Approximate)   BMI 32.31 kg/m   GENERAL:  Pleasant obese white female in NAD PSYCH: : cooperative, normal affect EENT:  conjunctiva pink, mucous membranes moist, neck supple without masses CARDIAC:  RRR, no murmur heard, no peripheral edema PULM: Normal respiratory effort, lungs CTA bilaterally, no wheezing ABDOMEN:  Nondistended, soft, nontender. No obvious masses, no hepatomegaly,  normal bowel sounds SKIN:  turgor, no lesions seen Musculoskeletal:  Normal muscle tone, normal strength NEURO: Alert and oriented x 3, no focal neurologic deficits   Tye Savoy , NP 04/24/2018, 3:20 PM

## 2018-04-24 NOTE — Patient Instructions (Addendum)
We have sent the following medications to your pharmacy for you to pick up at your convenience: Dicyclomine   Please come back to see Dr Zenovia Jarred in late June. We will call you with a date/time.    I appreciate the opportunity to care for you.

## 2018-05-15 ENCOUNTER — Telehealth: Payer: Self-pay

## 2018-05-15 NOTE — Telephone Encounter (Signed)
-----   Message from Joselyne Spake E [place], [place] sent at 04/24/2018  4:51 PM EDT -----  Set her up appointment to see Dr Waldon Merl for August, then let her know if she needs to see someone before she can come back and see Nevin Bloodgood.

## 2018-05-15 NOTE — Telephone Encounter (Signed)
Per Tye Savoy NP-C okay to set up August appointment, no June appointments open for Dr Hilarie Fredrickson . She is aware of date/time and she will call back if needs to see [place] sooner.

## 2018-06-26 DIAGNOSIS — R102 Pelvic and perineal pain: Secondary | ICD-10-CM | POA: Diagnosis not present

## 2018-06-26 DIAGNOSIS — M545 Low back pain: Secondary | ICD-10-CM | POA: Diagnosis not present

## 2018-07-05 ENCOUNTER — Ambulatory Visit (INDEPENDENT_AMBULATORY_CARE_PROVIDER_SITE_OTHER): Payer: PPO | Admitting: Family Medicine

## 2018-07-05 ENCOUNTER — Encounter: Payer: Self-pay | Admitting: Family Medicine

## 2018-07-05 VITALS — BP 136/88 | HR 103 | Ht 69.5 in | Wt 220.0 lb

## 2018-07-05 DIAGNOSIS — S161XXA Strain of muscle, fascia and tendon at neck level, initial encounter: Secondary | ICD-10-CM | POA: Diagnosis not present

## 2018-07-05 DIAGNOSIS — M542 Cervicalgia: Secondary | ICD-10-CM | POA: Diagnosis not present

## 2018-07-05 MED ORDER — CYCLOBENZAPRINE HCL 10 MG PO TABS: 10.0000 mg | ORAL_TABLET | Freq: Three times a day (TID) | ORAL | 0 refills | Status: DC | PRN
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
## 2018-07-05 NOTE — Patient Instructions (Signed)
Please see external handout on SCM and neck muscle spasms for your review.

## 2018-07-12 ENCOUNTER — Encounter: Payer: Self-pay | Admitting: Family Medicine

## 2018-07-12 ENCOUNTER — Telehealth: Payer: Self-pay | Admitting: Family Medicine

## 2018-07-12 ENCOUNTER — Ambulatory Visit (INDEPENDENT_AMBULATORY_CARE_PROVIDER_SITE_OTHER): Payer: PPO | Admitting: Family Medicine

## 2018-07-12 VITALS — BP 123/84 | HR 94 | Ht 70.0 in | Wt 216.2 lb

## 2018-07-12 DIAGNOSIS — E118 Type 2 diabetes mellitus with unspecified complications: Secondary | ICD-10-CM

## 2018-07-12 DIAGNOSIS — E669 Obesity, unspecified: Secondary | ICD-10-CM | POA: Diagnosis not present

## 2018-07-12 DIAGNOSIS — G5702 Lesion of sciatic nerve, left lower limb: Secondary | ICD-10-CM | POA: Insufficient documentation

## 2018-07-12 DIAGNOSIS — R030 Elevated blood-pressure reading, without diagnosis of hypertension: Secondary | ICD-10-CM | POA: Diagnosis not present

## 2018-07-12 HISTORY — DX: Lesion of sciatic nerve, left lower limb: G57.02

## 2018-07-12 LAB — POCT GLYCOSYLATED HEMOGLOBIN (HGB A1C): HbA1c, POC (controlled diabetic range): 6.9 % (ref 0.0–7.0)

## 2018-07-12 MED ORDER — METFORMIN HCL 500 MG PO TABS: ORAL_TABLET | ORAL | 0 refills | Status: DC

## 2018-07-12 NOTE — Progress Notes (Signed)
Impression and Recommendations:    1. Type 2 diabetes mellitus with complication, without long-term current use of insulin ([place])   2. Piriformis syndrome, left   3. Obesity (BMI 30-39.9)   4. Single episode of elevated blood pressure     1. Diabetes Mellitus - A1c today 6.9, improved from 7.2 on 04/06/2018. - Discussed goal A1C as 6.5.  - Begin 250 mg metformin twice daily.  Educated patient not to take metformin on an empty stomach.  Discussed goal to control sugars more optimally.  - Educated and counseled patient at length about medication risks, benefits, and side effects.  - Counseled patient on pathophysiology of disease and discussed various treatment options, which often includes dietary and lifestyle modifications as first line.  Importance of low carb/ketogenic diet discussed with patient in addition to regular exercise.   - Check FBS and 2 hours after the biggest meal of your day.  Keep log and bring in next OV for my review.   Also, if you ever feel poorly, please check your blood pressure and blood sugar, as one or the other could be the cause of your symptoms.  - Being a diabetic, you need yearly eye and foot exams. Make appt.for diabetic eye exam.  2. Elevated Blood Pressure in Office Today - Patient's blood pressure is normally low; per patient, she is riled up today due to an encounter with a strange man at the grocery store.  - Encouraged patient to keep an eye on this and let us know if her blood pressure remains elevated.  3. Cholesterol - Stable Last Check l 04/06/2018 - Encouraged patient to continue moving/exercising more.  - Dietary changes such as low saturated & trans fat and low carb/ ketogenic diets discussed with patient.  Encouraged regular exercise and weight loss when appropriate.   - Educational handouts provided at patient's desire.  4. Left Hip Pain - Advised patient today that new activities ( stair climbing)  or exercises could have  irritated this area.  - Advised patient to avoid biking, avoid stairs, avoid climbing, avoid walking on inclines or declines.  Walk on a flat surface to exercise. - ICE, NSAIDS, M relaxors which she has, and do stretches we gave her - Handout provided to patient today on piriformis stretches. - Apply ice to area of aggravation for 15-20 minutes, 3-4 times per day.  5. BMI Counseling - Encouraged continued weight loss.  Recommended the LoseIt or MyFitnessPal app to begin tracking intake.  - Explained to patient what BMI refers to, and what it means medically.    Told patient to think about it as a "medical risk stratification measurement" and how increasing BMI is associated with increasing risk/ or worsening state of various diseases such as hypertension, hyperlipidemia, diabetes, premature OA, depression etc.  - American Heart Association guidelines for healthy diet, basically Mediterranean diet, and exercise guidelines of 30 minutes 5 days per week or more discussed in detail.  - Health counseling performed.  All questions answered.  6. Lifestyle & Preventative Health Maintenance - Advised patient to continue working toward exercising to improve overall mental, physical, and emotional health.    - Encouraged patient to engage in daily physical activity, especially a formal exercise routine.  Recommended that the patient eventually strive for at least 150 minutes of moderate cardiovascular activity per week according to guidelines established by the [facility].   - Healthy dietary habits encouraged, including low-carb, and high amounts of lean protein in diet.   -  Patient should also consume adequate amounts of water - half of body weight in oz of water per day.    Education and routine counseling performed. Handouts provided.  7. Follow-Up - Send a message in 2-3 weeks to update [place] on progress with new dose of metformin.  - Continue to return for regularly scheduled chronic follow-up in 3  months.  - Patient's goal is 6+ more lbs weight loss in 3 months.  - Return to clinic PRN for acute concerns or other follow-up as desired.   Orders Placed This Encounter  Procedures  . POCT glycosylated hemoglobin (Hb A1C)  . HM Diabetes Foot Exam    Medications Discontinued During This Encounter  Medication Reason  . Cholecalciferol (VITAMIN D3) 2000 units capsule Patient Preference  . metFORMIN (GLUCOPHAGE) 500 MG tablet       Meds ordered this encounter  Medications  . metFORMIN (GLUCOPHAGE) 500 MG tablet    Sig: Take 283m every 12 hours after food    Dispense:  360 tablet    Refill:  0    Return for diabetes follow up every 3 mo.   The patient was counseled, risk factors were discussed, anticipatory guidance given.  Gross side effects, risk and benefits, and alternatives of medications discussed with patient.  Patient is aware that all medications have potential side effects and we are unable to predict every side effect or drug-drug interaction that may occur.  Expresses verbal understanding and consents to current therapy plan and treatment regimen.  Please see AVS handed out to patient at the end of our visit for further patient instructions/ counseling done pertaining to today's office visit.    Note:  This document was prepared using Dragon voice recognition software and may include unintentional dictation errors.  This document serves as a record of services personally performed by DMellody Dance DO. It was created on her behalf by KToni Amend a trained medical scribe. The creation of this record is based on the scribe's personal observations and the provider's statements to them.   I have reviewed the above medical documentation for accuracy and completeness and I concur.  DMellody Dance08/08/19 11:33 AM    Subjective:    Chief Complaint  Patient presents with  . Follow-up    AQUETZALLY CALLASis a 37y.o. [place] [facility]    Down to 216 from 222 lbs.  States she's had a lot of trauma in the past week; some man at the grocery store recently called her 940year old daughter "sexy" and told [place]  Is feeling a bit revved up and stressed about this.  Acute Left Hip Pain Started happening last night, came on relatively suddenly.  Feels that her left hip is "sticking out more than the other side."   Was recently going up and down the stairs at her sister's house and believes this could have aggravated the area.  She does not typically go up and down stairs.  She reports pain in the gluteal\buttock region on L that shoots down a little into her lat/ post upper leg. Worse with: twisting hip to get out of car. Better: with nothing in particular.  Just started last night really has not done anything for the pain  Also recently went to the orthopedic doctor for concerns about back pain.  States she had a steroid shot two weeks  ago and felt "jacked up" because of this.  DM HPI: -  She has been working on diet and exercise for diabetes.  Patient is trying to exercise more often and consuming more salads. Down 6 lbs in 3 mo  Pt is currently maintained on the following medications for diabetes:   see med list today  Medication compliance - Patient discontinued metformin because she disliked the way it made her feel.  States it "drops her sugar too low and makes her feel dizzy and crazy."  Made her feel very fatigued/ very tired and unmotivated to do anything.  Took the metformin for 2-3 weeks; can't remember.  Notes she hasn't taken it for over a month.  Home glucose readings range - Patient forgot her blood sugar log today. Unsure but around 150's- 140's.   Denies polyuria/polydipsia. Denies hypo/ hyperglycemia symptoms - She denies new onset of: chest pain, exercise intolerance, shortness of breath, dizziness, visual changes,  headache, lower extremity swelling or claudication.   Last diabetic eye exam was No results found for: HMDIABEYEEXA- pt states she has eye doc, doesn't recall name but will call for appt.  Declines referral.  Foot exam- UTD  Last A1C in the office was:  Lab Results  Component Value Date   HGBA1C 6.9 07/12/2018   HGBA1C 7.2 (H) 04/06/2018   HGBA1C 6.9 02/27/2018    Lab Results  Component Value Date   MICROALBUR 30 04/06/2018   LDLCALC 51 04/06/2018   CREATININE 0.81 04/06/2018     Filed Weights   07/12/18 1046  Weight: 216 lb 3.2 oz (98.1 kg)     Last 3 blood pressure readings in our office are as follows: BP Readings from Last 3 Encounters:  07/12/18 134/90  07/05/18 136/88  04/24/18 110/76    BMI Readings from Last 3 Encounters:  07/12/18 31.02 kg/m  07/05/18 32.02 kg/m  04/24/18 32.31 kg/m    Patient Care Team    Relationship Specialty Notifications Start End  [name], DO PCP - General Family Medicine  09/29/16   Pedro Earls, MD Attending Physician Family Medicine  09/29/16   Carol Ada, [place] Physician Gastroenterology  06/19/17   Marcial Pacas, MD Consulting Physician Neurology  08/24/17   Willia Craze, NP Nurse Practitioner Gastroenterology  04/06/18      Patient Active Problem List   Diagnosis Date Noted  . Diabetes mellitus, type II ([employer]) 12/29/2015    Priority: High  . Obesity (BMI 30-39.9) 02/17/2014    Priority: High  . Acute folliculitis- likely due to MRSA, they are draining 06/19/2017    Priority: Medium  . Personal history of MRSA (methicillin resistant Staphylococcus aureus) 06/19/2017    Priority: Medium  . Environmental and seasonal allergies 09/29/2016    Priority: Medium  . GERD (gastroesophageal reflux disease) 09/29/2016    Priority: Medium  . Bipolar 1 disorder - Anxiety 09/28/2016    Priority: Medium  . Fatty liver 09/05/2014    Priority: Medium  . Chronic diarrhea  06/19/2017    Priority: Low  .  Contact dermatitis and eczema- due to tape\ Band-Aid 06/19/2017    Priority: Low  . Fibromyalgia 08/11/2016    Priority: Low  . OSA on CPAP 12/08/2014    Priority: Low  . Obstructive sleep apnea syndrome, moderate 11/24/2014    Priority: Low  . S/P abdominal hysterectomy 08/15/2014    Priority: Low  . Piriformis syndrome, left 07/12/2018  . Single episode of elevated blood pressure 07/12/2018  .  Hypertriglyceridemia 04/11/2018  . Low level of high density lipoprotein (HDL) 04/11/2018  . Vitamin D insufficiency 04/11/2018  . Epigastric abdominal pain with N 04/06/2018  . History of vitamin D deficiency 04/06/2018  . Spondylosis without myelopathy or radiculopathy, lumbar region 10/03/2017  . Abdominal pain, LUQ 05/02/2017  . Abdominal pain, LLQ 05/02/2017  . Hx of diarrhea 05/02/2017  . Bladder dysfunction 04/30/2014  . Nephrolithiasis 03/04/2014  . Agenesis, corpus callosum ([place]) 02/17/2014  . GAD (generalized anxiety disorder) 02/17/2014  . History of blood transfusion 02/17/2014     Past Medical History:  Diagnosis Date  . Anemia   . Anxiety   . Depression    rx presc but not taken  . Diabetes mellitus without complication ([place])   . Fibromyalgia   . GERD (gastroesophageal reflux disease)    no meds  . Headache(784.0)    tension and with anxiety hx  . Hepatic steatosis   . History of kidney stones   . MVP (mitral valve prolapse)    echo 10  . Pneumonia    hx  . Seizures ([place])    non epileptic events per epilepsy monitoring unit  . Seizures ([place])    last seizure July 2015- does not convulse but has a sleep effect  . Sinus infection    recent on antibiotic  . Sleep apnea    has cpap does not use     Past Surgical History:  Procedure Laterality Date  . ABDOMINAL HYSTERECTOMY N/A 08/15/2014   Procedure: HYSTERECTOMY ABDOMINAL WITH CYSTOSCOPY;  Surgeon: Sanjuana Kava, MD;  Location: [facility];  Service: Gynecology;  Laterality: N/A;  . CESAREAN SECTION N/A   .  SHOULDER ARTHROSCOPY WITH SUBACROMIAL DECOMPRESSION AND OPEN ROTATOR C Left 11/27/2015   Procedure: LEFT SHOULDER ARTHROSCOPY WITH SUBACROMIAL DECOMPRESSION, MINI OPEN ROTATOR CUFF REPAIR;  Surgeon: Netta Cedars, MD;  Location: [facility];  Service: Orthopedics;  Laterality: Left;  . TUBAL LIGATION       Family History  Problem Relation Age of Onset  . Diabetes Mother   . Hypertension Mother   . Healthy Father   . Breast cancer Maternal Grandmother   . Cancer Maternal Grandfather        brain, lung, liver  . Heart attack Paternal Grandfather      Social History   Substance and Sexual Activity  Drug Use No  ,  Social History   Substance and Sexual Activity  Alcohol Use No  ,  Social History   Tobacco Use  Smoking Status Never Smoker  Smokeless Tobacco Never Used  ,    Current Outpatient Medications on File Prior to Visit  Medication Sig Dispense Refill  . blood glucose meter kit and supplies Dispense based on patient and insurance preference. Use to check blood glucose levels fasting in the morning and 2 hours after largest meal daily.  (FOR ICD-10 E10.9, E11.9). 1 each 0  . cyclobenzaprine (FLEXERIL) 10 MG tablet Take 1 tablet (10 mg total) by mouth 3 (three) times daily as needed for muscle spasms. 30 tablet 0  . dicyclomine (BENTYL) 20 MG tablet Take 1 tablet (20 mg total) by mouth 2 (two) times daily. 60 tablet 3  . ibuprofen (ADVIL,MOTRIN) 600 MG tablet Take 1 tablet (600 mg total) by mouth every 6 (six) hours as needed. 30 tablet 0  . omeprazole (PRILOSEC) 20 MG capsule Take 1 capsule (20 mg total) by mouth 2 (two) times daily before a meal. 60 capsule 0  . ondansetron (ZOFRAN-ODT)  8 MG disintegrating tablet Take 1 tablet (8 mg total) by mouth every 8 (eight) hours as needed for nausea. 20 tablet 1  . ranitidine (ZANTAC) 300 MG tablet Take 1 tablet (300 mg total) by mouth 2 (two) times daily. 60 tablet 0   No current facility-administered medications on file prior to  visit.      Allergies  Allergen Reactions  . Sulfa Antibiotics Anaphylaxis and Swelling  . Duloxetine Hcl Other (See Comments)    Weight gain, feels "crazy"  . Penicillins Rash    Has patient had a PCN reaction causing immediate rash, facial/tongue/throat swelling, SOB or lightheadedness with hypotension: Yes Has patient had a PCN reaction causing severe rash involving mucus membranes or skin necrosis: No Has patient had a PCN reaction that required hospitalization No Has patient had a PCN reaction occurring within the last 10 years: No If all of the above answers are "NO", then may proceed with Cephalosporin use.      Review of Systems:   General:  Denies fever, chills Optho/Auditory:   Denies visual changes, blurred vision Respiratory:   Denies SOB, cough, wheeze, DIB  Cardiovascular:   Denies chest pain, palpitations, painful respirations Gastrointestinal:   Denies nausea, vomiting, diarrhea.  Endocrine:     Denies new hot or cold intolerance Musculoskeletal:  Denies joint swelling, gait issues, or new unexplained myalgias/ arthralgias Skin:  Denies rash, suspicious lesions  Neurological:    Denies dizziness, unexplained weakness, numbness  Psychiatric/Behavioral:   Denies mood changes    Objective:     Blood pressure 134/90, pulse 94, height _0  (1.778 m), weight 216 lb 3.2 oz (98.1 kg), last menstrual period 08/05/2014, SpO2 98 %.  Body mass index is 31.02 kg/m.  General: Well Developed, well nourished, and in no acute distress.  HEENT: Normocephalic, atraumatic, pupils equal round reactive to light, neck supple, No carotid bruits, no JVD Skin: Warm and dry, cap RF less 2 sec Cardiac: Regular rate and rhythm, S1, S2 WNL's, no murmurs rubs or gallops Respiratory: ECTA B/L, Not using accessory muscles, speaking in full sentences. NeuroM-Sk: Ambulates w/o assistance, moves ext * 4 w/o difficulty, sensation grossly intact.  Ext: scant edema b/l lower ext Psych: No  HI/SI, judgement and insight good, Euthymic mood. Full Affect. Left Hip: Pain on internal rotation of hip against resistance.  + FAIR test.   No TTP greater troch, no bruising/ rash or warmth/swelling appreciated.  TTP upper outter quad L buttocks region w trigger pts.

## 2018-07-12 NOTE — Telephone Encounter (Signed)
  Please place the below notes into the referral.  Patient wants to see neuro for agenesis of corpus callosum.  Was seen in the past through [facility] approximately 7 years ago.    Wants to become established with neurologist here in [place] thru [facility].

## 2018-07-12 NOTE — Telephone Encounter (Signed)
Patient called to check on the status of her Neurology referral-- unable to locate one on file for her---   ---Forwarding request to medical assistant to review w/ provider for referral to Dr. Tomi Likens @ [facility] Neurology

## 2018-07-12 NOTE — Telephone Encounter (Signed)
Please advise if you'd like to place this referral.  Charyl Bigger, CMA

## 2018-07-12 NOTE — Patient Instructions (Signed)
Goal for 3 months is another 6 pound weight loss.  Good job - keep it up!!   Please see the handouts I gave you on piriformis syndrome stretches to help improve the pain in your left buttocks\hip. -Please ice and use NSAIDs as needed for pain. -If your symptoms do not improve the next couple of weeks, please let us know as we can send you to physical therapy for this.  Restart your metformin at 1/2 tablet every 12 hours.  Please make sure you are eating something first before taking it.  Also I know your blood pressure is normally under very good control and is up to transiently today due to stress, but please make sure you just keep an eye on it at home.

## 2018-07-13 ENCOUNTER — Other Ambulatory Visit: Payer: Self-pay

## 2018-07-13 DIAGNOSIS — Q040 Congenital malformations of corpus callosum: Secondary | ICD-10-CM

## 2018-07-13 NOTE — Telephone Encounter (Signed)
Referral placed. MPulliam, CMA/RT(R)  

## 2018-07-13 NOTE — Progress Notes (Signed)
Referral to neurology placed per Dr. Hershal Coria message.  Note as follow:   "Please place the below notes into the referral.  Patient wants to see neuro for agenesis of corpus callosum.  Was seen in the past through [facility] approximately 7 years ago.    Wants to become established with neurologist here in [place] thru [facility]."         MPulliam, CMA/RT(R)

## 2018-07-24 ENCOUNTER — Encounter: Payer: Self-pay | Admitting: Family Medicine

## 2018-07-31 ENCOUNTER — Encounter: Payer: Self-pay | Admitting: Internal Medicine

## 2018-07-31 ENCOUNTER — Ambulatory Visit (INDEPENDENT_AMBULATORY_CARE_PROVIDER_SITE_OTHER): Payer: PPO | Admitting: Internal Medicine

## 2018-07-31 VITALS — BP 126/80 | HR 76 | Ht 69.5 in | Wt 219.6 lb

## 2018-07-31 DIAGNOSIS — R140 Abdominal distension (gaseous): Secondary | ICD-10-CM | POA: Diagnosis not present

## 2018-07-31 DIAGNOSIS — R195 Other fecal abnormalities: Secondary | ICD-10-CM | POA: Diagnosis not present

## 2018-07-31 DIAGNOSIS — K760 Fatty (change of) liver, not elsewhere classified: Secondary | ICD-10-CM | POA: Diagnosis not present

## 2018-07-31 DIAGNOSIS — K580 Irritable bowel syndrome with diarrhea: Secondary | ICD-10-CM

## 2018-07-31 MED ORDER — RIFAXIMIN 550 MG PO TABS: 550.0000 mg | ORAL_TABLET | Freq: Three times a day (TID) | ORAL | 0 refills | Status: DC

## 2018-07-31 NOTE — Progress Notes (Signed)
Subjective:    Patient ID: Samantha [name], female    DOB: 25-Jun-1981, 37 y.o.   MRN: [id number]  HPI Samantha [name] is a 37 year old female with a past medical history of fatty liver, type 2 diabetes, history of fibromyalgia and history of chronic abdominal pain who is here for follow-up.  She is here alone today.  She was last seen on 04/24/2018 by Tye Savoy, NP.  This is her first visit with me.  Patient has a history of hepatic steatosis as seen on prior cross-sectional imaging with CT scan in 2018 and also by liver ultrasound earlier this year.  See the objective portion of this note for ultrasound findings which have been reviewed.  She also has a history of prior upper endoscopy and colonoscopy with Dr. Eulogio Bear performed in 2016.  The studies were unremarkable.  She continues to struggle with upper abdominal pain and predominantly bloating type symptom.  This can be intermittent but at times can seem to last for the better part of a week.  There are times when the bloating is significant and she feels that her close do not fit properly.  Other days it can bother her in the morning but not in the evening or in the evening and not in the morning.  Does worsen with eating.  Stools have remained predominantly loose and can occur 5-7 times per day.  These are nonbloody and non-melenic.  Stools did not always relate to eating.  She describes usually having 5 more urgent and loose stools each morning and then can have further stools later in the day.  No report of constipation.  She was tried on omeprazole for GERD type symptoms as well as Zantac.  Neither of these seem to help and so she discontinued both medicines.  She tried dicyclomine for about a month after being prescribed by Tye Savoy.  She seems to recall taking this about twice per day.  She recalls slightly less frequent bowel movements but no help with her abdominal pain.  There is some nausea but not vomiting.  She does take  metformin which was somewhat recently started for her diabetes which previously had been diet control.  She states that metformin did not change the frequency of her stools.  She takes ondansetron occasionally for nausea.  No other medicines are currently taken on a regular basis.  She does not drink alcohol.  Denies history of jaundice, itching, gastrointestinal bleeding, easy bruising, ascites or significant lower extremity edema.  Patient states she had an uncle with alcohol disease but no other known liver disease in her family history.   Review of Systems As per HPI, otherwise negative  Current Medications, Allergies, Past Medical History, Past Surgical History, Family History and Social History were reviewed in [employer] record.      Objective:   Physical Exam BP 126/80   Pulse 76   Ht 5' 9.5" (1.765 m)   Wt 219 lb 9.6 oz (99.6 kg)   LMP 08/05/2014 (Approximate)   BMI 31.96 kg/m  Constitutional: Well-developed and well-nourished. No distress. HEENT: Normocephalic and atraumatic. Conjunctivae are normal.  No scleral icterus. Neck: Neck supple. Trachea midline. Cardiovascular: Normal rate, regular rhythm and intact distal pulses. No M/R/G Pulmonary/chest: Effort normal and breath sounds normal. No wheezing, rales or rhonchi. Abdominal: Soft, mildly tender epigastrium without rebound or guarding, nondistended. Bowel sounds active throughout. There are no masses palpable. No hepatosplenomegaly. Extremities: no clubbing, cyanosis, or edema Neurological: Alert  and oriented to person place and time. Skin: Skin is warm and dry. Psychiatric: Normal mood and affect. Behavior is normal.  CMP     Component Value Date/Time   NA 138 04/06/2018 0955   K 4.3 04/06/2018 0955   CL 98 04/06/2018 0955   CO2 25 04/06/2018 0955   GLUCOSE 166 (H) 04/06/2018 0955   GLUCOSE 140 (H) 05/30/2017 2048   BUN 10 04/06/2018 0955   CREATININE 0.81 04/06/2018 0955   CALCIUM  9.6 04/06/2018 0955   PROT 7.1 04/06/2018 0955   ALBUMIN 4.2 04/06/2018 0955   AST 9 04/06/2018 0955   ALT 16 04/06/2018 0955   ALKPHOS 92 04/06/2018 0955   BILITOT <0.2 04/06/2018 0955   GFRNONAA 92 04/06/2018 0955   GFRAA 107 04/06/2018 0955   CBC    Component Value Date/Time   WBC 9.0 04/06/2018 0955   WBC 8.9 05/30/2017 2048   RBC 4.72 04/06/2018 0955   RBC 4.64 05/30/2017 2048   HGB 13.0 04/06/2018 0955   HCT 38.5 04/06/2018 0955   PLT 204 04/06/2018 0955   MCV 82 04/06/2018 0955   MCH 27.5 04/06/2018 0955   MCH 27.2 05/30/2017 2048   MCHC 33.8 04/06/2018 0955   MCHC 31.9 05/30/2017 2048   RDW 13.9 04/06/2018 0955   LYMPHSABS 2.4 04/06/2018 0955   MONOABS 0.7 11/30/2014 2054   EOSABS 0.2 04/06/2018 0955   BASOSABS 0.0 04/06/2018 0955   ULTRASOUND ABDOMEN LIMITED RIGHT UPPER QUADRANT   COMPARISON:  None.   FINDINGS: Gallbladder:   No gallstones or wall thickening visualized. No sonographic Murphy sign noted by sonographer.   Common bile duct:   Diameter: 3.4 mm   Liver:   No focal lesion. Increased parenchymal echogenicity suggests hepatic steatosis.   IMPRESSION: 1. No acute findings. 2. Echogenic liver suggesting hepatic steatosis.     Electronically Signed   By: Kerby Moors M.D.   On: 07/20/2017 08:25       Assessment & Plan:  37 year old female with a past medical history of fatty liver, type 2 diabetes, history of fibromyalgia and history of chronic abdominal pain who is here for follow-up.   1. Upper abdominal pain/bloating/loose stools --I am suspicious for irritable bowel syndrome with diarrhea predominance particularly given the upper abdominal pain and loose stools.  I am going to treat her with rifaximin 550 mg 3 times daily x14 days for IBS diarrhea.  If this does not help I would like to next consider gastroparesis given her upper abdominal bloating in the setting of diabetes.  If no response to rifaximin would recommend a formal  gastric emptying study.  She has had previous upper endoscopy and colonoscopy for similar symptoms which were unrevealing.  2.  Fatty liver --nonalcoholic fatty liver without liver enzyme elevation.  We discussed fatty liver at length today.  Given her diabetes I am not adding vitamin E as this has not been proven to be beneficial in patients with diabetes.  In the future if additional diabetes medications are needed pioglitazone may be a good option particularly in light of her fatty liver but would defer this to her primary physician.  I have recommended that she continue to maintain an active lifestyle, control her blood glucose levels as tightly as possible and try to lose weight by eating a relatively low calorie, high-fiber and low carbohydrate diet.  At this time there is no evidence for advanced liver disease or cirrhosis.  I recommend that her liver enzymes be  checked periodically to ensure she does not develop transaminitis/inflammation which would indicate NASH.  Follow-up in 4 to 6 weeks to assess response to therapy and for continuity  25 minutes spent with the patient today. Greater than 50% was spent in counseling and coordination of care with the patient

## 2018-07-31 NOTE — Patient Instructions (Signed)
Please follow up with Tye Savoy, NP in 6-8 weeks.   We have sent the following medications to your pharmacy for you to pick up at your convenience: Xifaxan 550 mg three times daily x 14 days.  If you are age 39 or older, your body mass index should be between 23-30. Your Body mass index is 31.96 kg/m. If this is out of the aforementioned range listed, please consider follow up with your Primary Care Provider.  If you are age 73 or younger, your body mass index should be between 19-25. Your Body mass index is 31.96 kg/m. If this is out of the aformentioned range listed, please consider follow up with your Primary Care Provider.

## 2018-09-04 ENCOUNTER — Ambulatory Visit (INDEPENDENT_AMBULATORY_CARE_PROVIDER_SITE_OTHER): Payer: PPO | Admitting: Family Medicine

## 2018-09-04 ENCOUNTER — Encounter: Payer: Self-pay | Admitting: Family Medicine

## 2018-09-04 VITALS — BP 125/85 | HR 86 | Ht 70.0 in | Wt 218.0 lb

## 2018-09-04 DIAGNOSIS — M4726 Other spondylosis with radiculopathy, lumbar region: Secondary | ICD-10-CM | POA: Insufficient documentation

## 2018-09-04 DIAGNOSIS — M48061 Spinal stenosis, lumbar region without neurogenic claudication: Secondary | ICD-10-CM | POA: Diagnosis not present

## 2018-09-04 DIAGNOSIS — M792 Neuralgia and neuritis, unspecified: Secondary | ICD-10-CM | POA: Diagnosis not present

## 2018-09-04 DIAGNOSIS — M5416 Radiculopathy, lumbar region: Secondary | ICD-10-CM

## 2018-09-04 DIAGNOSIS — G8929 Other chronic pain: Secondary | ICD-10-CM | POA: Diagnosis not present

## 2018-09-04 DIAGNOSIS — E669 Obesity, unspecified: Secondary | ICD-10-CM | POA: Insufficient documentation

## 2018-09-04 MED ORDER — PREGABALIN 75 MG PO CAPS: 75.0000 mg | ORAL_CAPSULE | Freq: Two times a day (BID) | ORAL | 5 refills | Status: DC

## 2018-09-04 NOTE — Progress Notes (Signed)
Impression and Recommendations:    1. Chronic radicular pain of lower back   2. Osteoarthritis of spine with radiculopathy, lumbar region   3. Lumbar foraminal stenosis-L5-S1   4. Neuropathic pain   5. Obesity, Class I, BMI 30-34.9 -abdominal adiposity with poor core strength      1. Chronic Radicular Pain of Lower Back:  -Will refer the patient to a Noah Delaine certified Physical Therapist to aid with back and leg pain, to which the patient agreed to today.   -Will refer the patient to [facility] for further evaluation, to which the patient agreed to today. Per Chart Review, last Lumbar MRI was completed on 09/19/2017.  -Will prescribe Lyrica today   2. Obesity, Class I, BMI 30-34.9 -abdominal adiposity with poor core strength  -Advised the patient to walk on a flat forgiving surface to aid with decreasing back pain. Patient advised to start with 5-10 minutes a day.  -Advised the patient to utilize the [employer] It app to aid with healthy dietary habits, including low-carb, and high amounts of lean protein in diet.     Follow up in 2 months since starting Lyrica.   Orders Placed This Encounter  Procedures  . Ambulatory referral to Physical Therapy  . Ambulatory referral to Neurosurgery    Meds ordered this encounter  Medications  . pregabalin (LYRICA) 75 MG capsule    Sig: Take 1 capsule (75 mg total) by mouth 2 (two) times daily.    Dispense:  60 capsule    Refill:  5    There are no discontinued medications.   Gross side effects, risk and benefits, and alternatives of medications and treatment plan in general discussed with patient.  Patient is aware that all medications have potential side effects and we are unable to predict every side effect or drug-drug interaction that may occur.   Patient will call with any questions prior to using medication if they have concerns.    Expresses verbal understanding and consents to current therapy and  treatment regimen.  No barriers to understanding were identified.  Red flag symptoms and signs discussed in detail.  Patient expressed understanding regarding what to do in case of emergency\urgent symptoms  Please see AVS handed out to patient at the end of our visit for further patient instructions/ counseling done pertaining to today's office visit.   Return Follow-up 2 months since starting Lyrica. , for You will need to go to PT, and [facility] for further evaluation and treatment.     Note:  This note was prepared with assistance of Dragon voice recognition software. Occasional wrong-word or sound-a-like substitutions may have occurred due to the inherent limitations of voice recognition software.  This document serves as a record of services personally performed by [name], DO. It was created on her behalf by Steva Colder, a trained medical scribe. The creation of this record is based on the scribe's personal observations and the provider's statements to them.   I have reviewed the above medical documentation for accuracy and completeness and I concur.  [name], D.O.    --------------------------------------------------------------------------------------------------------------------------------------------------------------------------------------------------------------------------------------------    Subjective:     HPI: Samantha [name] is a 37 y.o. female who presents to [facility] today for issues as discussed below.  Generalized Back Pain and RLE pain:  She notes that she has had ongoing pain all over her body localized to her back and BLE. She has  currently right lower back pain that radiates to her posterior right knee and intermittently, she will have RLE cramping. She notes that it feels as if she has a catch in her back that sometimes will cause her to fall. She also notes that she had an episode of shooting pain to her RLE  that has since resolved. She has tense neck muscles as well.   She is not currently taking medications. She notes that flexeril keeps her up for days and that she takes 600 mg ibuprofen and tylenol PRN with no relief of her symptoms. She is not a smoker. She is not currently exercising, however, she completes various daily activities. She is currently unemployed.   She was referred in the past to an orthopedic due to possible sciatica and she was given exercises to complete at home. She notes that the exercises added to her back that she had and she followed up with her orthopedist in August 2019. She was offered a referral to Physical Therapy, however, she continued to complete her exercises at home.   She was referred to Dr. Sharol Given and had an appointment on 10/03/2017 and she was diagnosed with spondylosis without myelopathy or radiculopathy, lumbar region.    Wt Readings from Last 3 Encounters:  09/04/18 218 lb (98.9 kg)  07/31/18 219 lb 9.6 oz (99.6 kg)  07/12/18 216 lb 3.2 oz (98.1 kg)   BP Readings from Last 3 Encounters:  09/04/18 125/85  07/31/18 126/80  07/12/18 123/84   Pulse Readings from Last 3 Encounters:  09/04/18 86  07/31/18 76  07/12/18 94   BMI Readings from Last 3 Encounters:  09/04/18 31.28 kg/m  07/31/18 31.96 kg/m  07/12/18 31.02 kg/m     Patient Care Team    Relationship Specialty Notifications Start End  [name], DO PCP - General Family Medicine  09/29/16   Pedro Earls, MD Attending Physician Family Medicine  09/29/16   Carol Ada, MD Consulting Physician Gastroenterology  06/19/17   Marcial Pacas, MD Consulting Physician Neurology  08/24/17   Willia Craze, NP Nurse Practitioner Gastroenterology  04/06/18   Ob/Gyn, [facility]    07/24/18      Patient Active Problem List   Diagnosis Date Noted  . Diabetes mellitus, type II ([place]) 12/29/2015    Priority: High  . Obesity (BMI 30-39.9) 02/17/2014    Priority: High  . Acute  folliculitis- likely due to MRSA, they are draining 06/19/2017    Priority: Medium  . Personal history of MRSA (methicillin resistant Staphylococcus aureus) 06/19/2017    Priority: Medium  . Environmental and seasonal allergies 09/29/2016    Priority: Medium  . GERD (gastroesophageal reflux disease) 09/29/2016    Priority: Medium  . Bipolar 1 disorder - Anxiety 09/28/2016    Priority: Medium  . Fatty liver 09/05/2014    Priority: Medium  . Chronic diarrhea  06/19/2017    Priority: Low  . Contact dermatitis and eczema- due to tape\ Band-Aid 06/19/2017    Priority: Low  . Fibromyalgia 08/11/2016    Priority: Low  . OSA on CPAP 12/08/2014    Priority: Low  . Obstructive sleep apnea syndrome, moderate 11/24/2014    Priority: Low  . S/P abdominal hysterectomy 08/15/2014    Priority: Low  . Neuropathic pain 09/04/2018  . Chronic radicular pain of lower back 09/04/2018  . Lumbar foraminal stenosis-L5-S1 09/04/2018  . Osteoarthritis of spine with radiculopathy, lumbar region 09/04/2018  . Obesity, Class I, BMI 30-34.9 09/04/2018  .  Piriformis syndrome, left 07/12/2018  . Single episode of elevated blood pressure 07/12/2018  . Hypertriglyceridemia 04/11/2018  . Low level of high density lipoprotein (HDL) 04/11/2018  . Vitamin D insufficiency 04/11/2018  . Epigastric abdominal pain with N 04/06/2018  . History of vitamin D deficiency 04/06/2018  . Spondylosis without myelopathy or radiculopathy, lumbar region 10/03/2017  . Abdominal pain, LUQ 05/02/2017  . Abdominal pain, LLQ 05/02/2017  . Hx of diarrhea 05/02/2017  . Bladder dysfunction 04/30/2014  . Nephrolithiasis 03/04/2014  . Agenesis, corpus callosum ([place]) 02/17/2014  . GAD (generalized anxiety disorder) 02/17/2014  . History of blood transfusion 02/17/2014    Past Medical history, Surgical history, Family history, Social history, Allergies and Medications have been entered into the medical record, reviewed and changed as  needed.    Current Meds  Medication Sig  . blood glucose meter kit and supplies Dispense based on patient and insurance preference. Use to check blood glucose levels fasting in the morning and 2 hours after largest meal daily.  (FOR ICD-10 E10.9, E11.9).  . cyclobenzaprine (FLEXERIL) 10 MG tablet Take 1 tablet (10 mg total) by mouth 3 (three) times daily as needed for muscle spasms.  Marland Kitchen ibuprofen (ADVIL,MOTRIN) 600 MG tablet Take 1 tablet (600 mg total) by mouth every 6 (six) hours as needed.  . metFORMIN (GLUCOPHAGE) 500 MG tablet Take '250mg'$  every 12 hours after food  . ondansetron (ZOFRAN-ODT) 8 MG disintegrating tablet Take 1 tablet (8 mg total) by mouth every 8 (eight) hours as needed for nausea.  . rifaximin (XIFAXAN) 550 MG TABS tablet Take 1 tablet (550 mg total) by mouth 3 (three) times daily. (Patient not taking: Reported on 09/18/2018)    Allergies:  Allergies  Allergen Reactions  . Sulfa Antibiotics Anaphylaxis and Swelling  . Duloxetine Hcl Other (See Comments)    Weight gain, feels "crazy"  . Penicillins Rash    Has patient had a PCN reaction causing immediate rash, facial/tongue/throat swelling, SOB or lightheadedness with hypotension: Yes Has patient had a PCN reaction causing severe rash involving mucus membranes or skin necrosis: No Has patient had a PCN reaction that required hospitalization No Has patient had a PCN reaction occurring within the last 10 years: No If all of the above answers are "NO", then may proceed with Cephalosporin use.      Review of Systems:  A fourteen system review of systems was performed and found to be positive as per HPI.   Objective:   Blood pressure 125/85, pulse 86, height '5\' 10"'$  (1.778 m), weight 218 lb (98.9 kg), last menstrual period 08/05/2014, SpO2 99 %. Body mass index is 31.28 kg/m. General:  Well Developed, well nourished, appropriate for stated age.  Neuro:  Alert and oriented,  extra-ocular muscles intact  HEENT:   Normocephalic, atraumatic, neck supple, no carotid bruits appreciated  Skin:  no gross rash, warm, pink. Cardiac:  RRR, S1 S2 Respiratory:  ECTA B/L and A/P, Not using accessory muscles, speaking in full sentences- unlabored. Vascular:  Ext warm, no cyanosis apprec.; cap RF less 2 sec. Psych:  No HI/SI, judgement and insight good, Euthymic mood. Full Affect.

## 2018-09-04 NOTE — Patient Instructions (Signed)
MRI from 09/2017:  IMPRESSION: MR THORACIC SPINE IMPRESSION  1. No acute osseous abnormality or abnormal cord signal. 2. No significant disc displacement, foraminal stenosis, or canal Stenosis.   MRI LUMBAR SPINE IMPRESSION  1. No acute osseous abnormality. 2. Mild lumbar spine degenerative changes at the L3-4 through L5-S1 levels. 3. Mild bilateral L5-S1 foraminal stenosis. 4. No significant canal stenosis.

## 2018-09-11 DIAGNOSIS — Z01419 Encounter for gynecological examination (general) (routine) without abnormal findings: Secondary | ICD-10-CM | POA: Diagnosis not present

## 2018-09-11 DIAGNOSIS — Z1231 Encounter for screening mammogram for malignant neoplasm of breast: Secondary | ICD-10-CM | POA: Diagnosis not present

## 2018-09-11 DIAGNOSIS — N390 Urinary tract infection, site not specified: Secondary | ICD-10-CM | POA: Diagnosis not present

## 2018-09-11 DIAGNOSIS — R030 Elevated blood-pressure reading, without diagnosis of hypertension: Secondary | ICD-10-CM | POA: Diagnosis not present

## 2018-09-11 DIAGNOSIS — Z6833 Body mass index (BMI) 33.0-33.9, adult: Secondary | ICD-10-CM | POA: Diagnosis not present

## 2018-09-11 DIAGNOSIS — M5137 Other intervertebral disc degeneration, lumbosacral region: Secondary | ICD-10-CM | POA: Diagnosis not present

## 2018-09-18 ENCOUNTER — Encounter: Payer: Self-pay | Admitting: Physical Therapy

## 2018-09-18 ENCOUNTER — Ambulatory Visit: Payer: PPO | Attending: Family Medicine | Admitting: Physical Therapy

## 2018-09-18 ENCOUNTER — Other Ambulatory Visit: Payer: Self-pay

## 2018-09-18 DIAGNOSIS — G8929 Other chronic pain: Secondary | ICD-10-CM | POA: Diagnosis not present

## 2018-09-18 DIAGNOSIS — M5416 Radiculopathy, lumbar region: Secondary | ICD-10-CM | POA: Insufficient documentation

## 2018-09-18 DIAGNOSIS — M5440 Lumbago with sciatica, unspecified side: Secondary | ICD-10-CM | POA: Diagnosis not present

## 2018-09-18 DIAGNOSIS — R262 Difficulty in walking, not elsewhere classified: Secondary | ICD-10-CM | POA: Diagnosis not present

## 2018-09-18 NOTE — Patient Instructions (Signed)
Step 1  Step 2  Supine Lower Trunk Rotation reps: 10  sets: 1  hold: 15  daily: 1  weekly: 7 Setup  Begin lying on your back with your knees bent and feet resting on the floor. Movement  Keeping your back flat, slowly rotate your knees down towards the floor until you feel a stretch in your trunk and hold. Tip  Make sure that your back and shoulders stay in contact with the floor. Step 1  Step 2  Seated Hamstring Stretch reps: 3  sets: 1  hold: 30  daily: 2  weekly: 7 Setup  Begin sitting upright with one leg straight forward and your heel resting on the ground. Movement  Bend your trunk forward, hinging at your hips until you feel a stretch in the back of your leg. Hold this position.  Tip  Make sure to keep your knee straight during the stretch and do not let your back arch or slump. Step 1  Step 2  Supine Hamstring Stretch with Strap reps: 3  sets: 1  hold: 30  daily: 2  weekly: 7 Setup  Begin lying on your back with your legs straight, holding the end of a strap that is looped around one foot.  Movement  Use the strap to pull your leg up toward your body until you feel a gentle stretch in the back of your upper leg. Hold this position. Tip  Make sure to keep your other leg straight on the ground during the stretch. Step 1  Step 2  Supine Piriformis Stretch with Foot on Ground reps: 3  sets: 1  hold: 30  daily: 1  weekly: 7 Setup  Begin by lying on your back with both knees bent and feet resting flat on the ground. Cross one leg over the other so your foot is resting on your knee.  Movement  Grab your leg just below the knee and slowly draw it towards your opposite shoulder until you feel a stretch in your buttocks.  Tip

## 2018-09-18 NOTE — Therapy (Signed)
[place], [place], [address] Phone: [phone]   Fax:  [phone]  Physical Therapy Evaluation  Patient Details  Name: Samantha [name] MRN: [id number] Date of Birth: 1981/06/03 Referring Provider (PT): Dr. Raliegh Scarlet   Encounter Date: 09/18/2018  PT End of Session - 09/18/18 1256    Visit Number  1    Number of Visits  16    Date for PT Re-Evaluation  11/13/18    PT Start Time  1100    PT Stop Time  1150    PT Time Calculation (min)  50 min    Activity Tolerance  Patient tolerated treatment well    Behavior During Therapy  [facility] for tasks assessed/performed       Past Medical History:  Diagnosis Date  . Anemia   . Anxiety   . Depression    rx presc but not taken  . Diabetes mellitus without complication ([place])   . Fibromyalgia   . GERD (gastroesophageal reflux disease)    no meds  . Headache(784.0)    tension and with anxiety hx  . Hepatic steatosis   . History of kidney stones   . MVP (mitral valve prolapse)    echo 10  . Pneumonia    hx  . Seizures ([place])    non epileptic events per epilepsy monitoring unit  . Seizures ([place])    last seizure July 2015- does not convulse but has a sleep effect  . Sinus infection    recent on antibiotic  . Sleep apnea    has cpap does not use    Past Surgical History:  Procedure Laterality Date  . ABDOMINAL HYSTERECTOMY N/A 08/15/2014   Procedure: HYSTERECTOMY ABDOMINAL WITH CYSTOSCOPY;  Surgeon: Sanjuana Kava, MD;  Location: [facility];  Service: Gynecology;  Laterality: N/A;  . CESAREAN SECTION N/A   . SHOULDER ARTHROSCOPY WITH SUBACROMIAL DECOMPRESSION AND OPEN ROTATOR C Left 11/27/2015   Procedure: LEFT SHOULDER ARTHROSCOPY WITH SUBACROMIAL DECOMPRESSION, MINI OPEN ROTATOR CUFF REPAIR;  Surgeon: Netta Cedars, MD;  Location: [facility];  Service: Orthopedics;  Laterality: Left;  . TUBAL LIGATION      There were no vitals filed for this visit.   Subjective  Assessment - 09/18/18 1104    Subjective  Patient has noted in an increase in her chronic low back pain in the past 2-3 months.  Pain was initially about 10 yrs ago.  About 2-3 mos ago she began to have an increase in pain while helping her mom.   She has pain and sensory symptoms in both legs intermittently.  She has occasionally has muscle spasms.  She has difficulty walking long periods, has pain when activity is done. She is uncomfortable with long periods of sitting, standing and laying down.      Limitations  Sitting;Lifting;Standing;Walking;House hold activities;Other (comment)   sleeping    How long can you stand comfortably?  2-3 hours     How long can you walk comfortably?  walks with her mom "kills her" for about 30 min max     Diagnostic tests  MRI 1 yr ago    Patient Stated Goals  Patient wants less pain, likes yardwork    Currently in Pain?  Yes    Pain Score  9     Pain Location  Back    Pain Orientation  Lower    Pain Descriptors / Indicators  Tightness;Other (Comment)   swollen    Pain Type  Chronic pain    Pain Onset  More than a month ago    Pain Frequency  Constant    Pain Relieving Factors  reposition, meds take the edge off     Effect of Pain on Daily Activities  limits her ADLs          [facility] PT Assessment - 09/18/18 0001      Assessment   Medical Diagnosis  lumbar     Referring Provider (PT)  Dr. Raliegh Scarlet    Onset Date/Surgical Date  --   3 mos , chronic    Next MD Visit  after PT     Prior Therapy  No       Precautions   Precautions  None      Restrictions   Weight Bearing Restrictions  No      Balance Screen   Has the patient fallen in the past 6 months  Yes    How many times?  1    Has the patient had a decrease in activity level because of a fear of falling?   No    Is the patient reluctant to leave their home because of a fear of falling?   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, runs the concession stand       Cognition   Overall Cognitive Status  Within Functional Limits for tasks assessed      Observation/Other Assessments   Focus on Therapeutic Outcomes (FOTO)   48%      Sensation   Light Touch  Appears Intact    Additional Comments  reports intermittent numbness/tingling bilateral post thigh       Posture/Postural Control   Posture/Postural Control  Postural limitations    Postural Limitations  Rounded Shoulders;Forward head;Increased lumbar lordosis      AROM   Lumbar Flexion  knees bent 25%, knees ext 50% limited     Lumbar Extension  50%     Lumbar - Right Side Bend  50%    Lumbar - Left Side Bend  50%    Lumbar - Right Rotation  pain 10-15%     Lumbar - Left Rotation  less pain, 10-15%      Strength   Overall Strength Comments  WFL in bilateral LEs       Flexibility   Hamstrings  tight    Piriformis  tight       Palpation   Spinal mobility  hypomobile throughout     Palpation comment  pain/tenderness along bilateral thoracolumbar paraspinals, extending into bilateral glutes, lumbar       Special Tests    Special Tests  Lumbar    Lumbar Tests  Slump Test;Straight Leg Raise      Slump test   Findings  Negative      Straight Leg Raise  Findings  Negative          Objective measurements completed on examination: See above findings.      [facility] Adult PT Treatment/Exercise - 09/18/18 0001      Knee/Hip Exercises: Stretches   Active Hamstring Stretch  Both;2 reps    Piriformis Stretch  Both;2 reps     Other Knee/Hip Stretches  LTR with hip IR/ER x 10       Moist Heat Therapy   Number Minutes Moist Heat  10 Minutes    Moist Heat Location  Lumbar Spine             PT Education - 09/18/18 1256    Education Details  PT/POC, HEP    Person(s) Educated  Patient    Methods  Explanation;Handout    Comprehension  Verbalized understanding;Returned demonstration       PT Short Term Goals - 09/18/18 1258      PT SHORT TERM GOAL #1   Title  Pt will be I with initial HEP     Time  4    Period  Weeks    Status  New    Target Date  10/16/18      PT SHORT TERM GOAL #2   Title  Pt will be able to increase her ability to sit comfortably in her home for meals, rest, requiring less repositioning.     Time  4    Period  Weeks    Status  New    Target Date  10/16/18      PT SHORT TERM GOAL #3   Title  Pt will be able to demo proper lifting, transition from mat to sitting with no increase in pain.     Time  4    Period  Weeks    Status  New    Target Date  10/16/18        PT Long Term Goals - 09/18/18 1305      PT LONG TERM GOAL #1   Title  Pt will be able to walk with her mom for 30 min with min increase in pain from baseline.     Time  8    Period  Weeks    Status  New    Target Date  11/13/18      PT LONG TERM GOAL #2   Title  Pt will be able to report min LE radicular symptoms to ease her ability to walk, engage with her kids.     Time  8    Period  Weeks    Status  New    Target Date  11/13/18      PT LONG TERM GOAL #3   Title  Pt will be I with more advanced HEP     Time  8    Period  Weeks    Status  New    Target Date  11/13/18      PT LONG TERM GOAL #4   Title  Pt will improve FOTO score to no more than 35% limited.     Time  8    Period  Weeks    Status  New    Target Date  11/13/18             Plan - 09/18/18 1312    Clinical Impression Statement  Pt presents for low complexity eval of chronic low back pain with exacerbation about 3 mos ago.   She has good strength but  restrictions in bilateral hip internal rotation L>R , lumbar/trunk flexibility.  She needed cues for basic transitions on  and off the mat.  She has already consulted the Neurosurgeon who is ordering an MRI.  She has not tried PT but she hopes to improve her functional mobility enough to avoid surgery and become more active.     Clinical Presentation  Stable    Clinical Decision Making  Low    Rehab Potential  Excellent    PT Frequency  2x / week    PT Duration  8 weeks    PT Treatment/Interventions  ADLs/Self Care Home Management;Cryotherapy;Traction;Moist Heat;Electrical Stimulation;Gait training;Therapeutic exercise;Therapeutic activities;Functional mobility training;Neuromuscular re-education;Patient/family education;Manual techniques;Taping    PT Next Visit Plan  Check HEP, modalities for pain, begin core     PT Home Exercise Plan  LTR, piriformis, hamstring     Consulted and Agree with Plan of Care  Patient       Patient will benefit from skilled therapeutic intervention in order to improve the following deficits and impairments:  Decreased endurance, Decreased mobility, Hypomobility, Obesity, Pain, Increased fascial restricitons, Impaired flexibility, Impaired UE functional use, Postural dysfunction, Decreased strength, Decreased activity tolerance, Decreased range of motion, Difficulty walking, Increased muscle spasms, Impaired sensation  Visit Diagnosis: Radiculopathy, lumbar region  Chronic midline low back pain with sciatica, sciatica laterality unspecified  Difficulty in walking, not elsewhere classified     Problem List Patient Active Problem List   Diagnosis Date Noted  . Neuropathic pain 09/04/2018  . Chronic radicular pain of lower back 09/04/2018  . Lumbar foraminal stenosis-L5-S1 09/04/2018  . Osteoarthritis of spine with radiculopathy, lumbar region 09/04/2018  . Obesity, Class I, BMI 30-34.9 09/04/2018  . Piriformis syndrome, left  07/12/2018  . Single episode of elevated blood pressure 07/12/2018  . Hypertriglyceridemia 04/11/2018  . Low level of high density lipoprotein (HDL) 04/11/2018  . Vitamin D insufficiency 04/11/2018  . Epigastric abdominal pain with N 04/06/2018  . History of vitamin D deficiency 04/06/2018  . Spondylosis without myelopathy or radiculopathy, lumbar region 10/03/2017  . Acute folliculitis- likely due to MRSA, they are draining 06/19/2017  . Personal history of MRSA (methicillin resistant Staphylococcus aureus) 06/19/2017  . Chronic diarrhea  06/19/2017  . Contact dermatitis and eczema- due to tape\ Band-Aid 06/19/2017  . Abdominal pain, LUQ 05/02/2017  . Abdominal pain, LLQ 05/02/2017  . Hx of diarrhea 05/02/2017  . Environmental and seasonal allergies 09/29/2016  . GERD (gastroesophageal reflux disease) 09/29/2016  . Bipolar 1 disorder - Anxiety 09/28/2016  . Fibromyalgia 08/11/2016  . Diabetes mellitus, type II ([place]) 12/29/2015  . OSA on CPAP 12/08/2014  . Obstructive sleep apnea syndrome, moderate 11/24/2014  . Fatty liver 09/05/2014  . S/P abdominal hysterectomy 08/15/2014  . Bladder dysfunction 04/30/2014  . Nephrolithiasis 03/04/2014  . Agenesis, corpus callosum ([place]) 02/17/2014  . GAD (generalized anxiety disorder) 02/17/2014  . History of blood transfusion 02/17/2014  . Obesity (BMI 30-39.9) 02/17/2014    Aren Cherne 09/18/2018, 1:28 PM  [facility] [address] [place], [place], [address] Phone: [phone]   Fax:  [phone]  Name: KIRSTA PROBERT MRN: [id number] Date of Birth: May 20, 1981   Raeford Razor, PT 09/18/18 1:28 PM Phone: [phone] Fax: [phone]

## 2018-09-18 NOTE — Addendum Note (Signed)
Addended by: Raeford Razor L on: 09/18/2018 01:31 PM   Modules accepted: Orders

## 2018-09-24 ENCOUNTER — Other Ambulatory Visit: Payer: Self-pay | Admitting: Neurosurgery

## 2018-09-24 DIAGNOSIS — M5137 Other intervertebral disc degeneration, lumbosacral region: Secondary | ICD-10-CM

## 2018-09-25 ENCOUNTER — Ambulatory Visit: Payer: PPO | Admitting: Physical Therapy

## 2018-09-25 DIAGNOSIS — R51 Headache: Secondary | ICD-10-CM | POA: Diagnosis not present

## 2018-09-25 DIAGNOSIS — R569 Unspecified convulsions: Secondary | ICD-10-CM | POA: Diagnosis not present

## 2018-09-25 DIAGNOSIS — F419 Anxiety disorder, unspecified: Secondary | ICD-10-CM | POA: Diagnosis not present

## 2018-09-25 DIAGNOSIS — Z87442 Personal history of urinary calculi: Secondary | ICD-10-CM | POA: Diagnosis not present

## 2018-09-30 ENCOUNTER — Other Ambulatory Visit: Payer: PPO

## 2018-10-01 ENCOUNTER — Ambulatory Visit
Admission: RE | Admit: 2018-10-01 | Discharge: 2018-10-01 | Disposition: A | Discharge status: Home / Self Care | Payer: PPO | Source: Ambulatory Visit | Attending: Neurosurgery | Admitting: Neurosurgery

## 2018-10-01 DIAGNOSIS — M545 Low back pain: Secondary | ICD-10-CM | POA: Diagnosis not present

## 2018-10-01 DIAGNOSIS — M5137 Other intervertebral disc degeneration, lumbosacral region: Secondary | ICD-10-CM

## 2018-10-02 ENCOUNTER — Ambulatory Visit: Payer: PPO | Admitting: Physical Therapy

## 2018-10-03 ENCOUNTER — Ambulatory Visit: Payer: PPO | Admitting: Physical Therapy

## 2018-10-03 ENCOUNTER — Encounter: Payer: Self-pay | Admitting: Physical Therapy

## 2018-10-03 DIAGNOSIS — R262 Difficulty in walking, not elsewhere classified: Secondary | ICD-10-CM

## 2018-10-03 DIAGNOSIS — M5440 Lumbago with sciatica, unspecified side: Secondary | ICD-10-CM

## 2018-10-03 DIAGNOSIS — M5416 Radiculopathy, lumbar region: Secondary | ICD-10-CM | POA: Diagnosis not present

## 2018-10-03 DIAGNOSIS — G8929 Other chronic pain: Secondary | ICD-10-CM

## 2018-10-03 NOTE — Therapy (Signed)
[place], [place], [address] Phone: [phone]   Fax:  [phone]  Physical Therapy Treatment  Patient Details  Name: Samantha [name] MRN: [id number] Date of Birth: [birth year] Referring Provider (PT): Dr. Raliegh Scarlet   Encounter Date: 10/03/2018  PT End of Session - 10/03/18 1042    Visit Number  2    Number of Visits  16    Date for PT Re-Evaluation  11/13/18    PT Start Time  1015    PT Stop Time  1115    PT Time Calculation (min)  60 min    Activity Tolerance  Patient tolerated treatment well    Behavior During Therapy  [facility] for tasks assessed/performed       Past Medical History:  Diagnosis Date  . Anemia   . Anxiety   . Depression    rx presc but not taken  . Diabetes mellitus without complication ([place])   . Fibromyalgia   . GERD (gastroesophageal reflux disease)    no meds  . Headache(784.0)    tension and with anxiety hx  . Hepatic steatosis   . History of kidney stones   . MVP (mitral valve prolapse)    echo 10  . Pneumonia    hx  . Seizures ([place])    non epileptic events per epilepsy monitoring unit  . Seizures ([place])    last seizure July 2015- does not convulse but has a sleep effect  . Sinus infection    recent on antibiotic  . Sleep apnea    has cpap does not use    Past Surgical History:  Procedure Laterality Date  . ABDOMINAL HYSTERECTOMY N/A 08/15/2014   Procedure: HYSTERECTOMY ABDOMINAL WITH CYSTOSCOPY;  Surgeon: Sanjuana Kava, MD;  Location: [facility];  Service: Gynecology;  Laterality: N/A;  . CESAREAN SECTION N/A   . SHOULDER ARTHROSCOPY WITH SUBACROMIAL DECOMPRESSION AND OPEN ROTATOR C Left 11/27/2015   Procedure: LEFT SHOULDER ARTHROSCOPY WITH SUBACROMIAL DECOMPRESSION, MINI OPEN ROTATOR CUFF REPAIR;  Surgeon: Netta Cedars, MD;  Location: [facility];  Service: Orthopedics;  Laterality: Left;  . TUBAL LIGATION      There were no vitals filed for this visit.  Subjective  Assessment - 10/03/18 1018    Subjective  Its a solid 8/10.  I had an MRI.  cant find a comfortable position. Especially trying to sleep.      Currently in Pain?  Yes    Pain Score  8    sitting 5/10 at rest    Pain Location  Back    Pain Orientation  Lower    Pain Descriptors / Indicators  Aching;Tightness    Pain Type  Chronic pain    Pain Onset  More than a month ago    Pain Frequency  Constant    Aggravating Factors   activity, moving the wrong way     Pain Relieving Factors  reposition , meds            [facility] Adult PT Treatment/Exercise - 10/03/18 0001      Lumbar Exercises: Stretches   Active Hamstring Stretch  3 reps;30 seconds    Single Knee to Chest Stretch  3 reps;30 seconds    Lower Trunk Rotation  10 seconds    Lower Trunk Rotation Limitations  x 10     Pelvic Tilt  10 reps    Piriformis Stretch  --    Figure 4 Stretch  3 reps;30 seconds  Lumbar Exercises: Supine   Ab Set  10 reps    Clam  10 reps;20 reps    Clam Limitations  bilateral and unilateral x 10 each       Moist Heat Therapy   Number Minutes Moist Heat  15 Minutes    Moist Heat Location  Lumbar Spine      Electrical Stimulation   Electrical Stimulation Location  lumbar     Electrical Stimulation Action  IFC     Electrical Stimulation Parameters  to tolerance     Electrical Stimulation Goals  Pain      Manual Therapy   Manual Therapy  Soft tissue mobilization;Myofascial release    Soft tissue mobilization  Rt piriformis, glutes, Bilateral lumbar paraspinals     Myofascial Release  Rt hip, back              PT Education - 10/03/18 1318    Education Details  core, abdominal draw- in, IFC     Person(s) Educated  Patient    Methods  Explanation;Handout    Comprehension  Verbalized understanding;Returned demonstration;Verbal cues required       PT Short Term Goals - 10/03/18 1319      PT SHORT TERM GOAL #1   Title  Pt will be I with initial HEP     Baseline  has not done  consistently     Status  On-going      PT SHORT TERM GOAL #2   Title  Pt will be able to increase her ability to sit comfortably in her home for meals, rest, requiring less repositioning.     Status  On-going      PT SHORT TERM GOAL #3   Title  Pt will be able to demo proper lifting, transition from mat to sitting with no increase in pain.     Status  On-going        PT Long Term Goals - 09/18/18 1305      PT LONG TERM GOAL #1   Title  Pt will be able to walk with her mom for 30 min with min increase in pain from baseline.     Time  8    Period  Weeks    Status  New    Target Date  11/13/18      PT LONG TERM GOAL #2   Title  Pt will be able to report min LE radicular symptoms to ease her ability to walk, engage with her kids.     Time  8    Period  Weeks    Status  New    Target Date  11/13/18      PT LONG TERM GOAL #3   Title  Pt will be I with more advanced HEP     Time  8    Period  Weeks    Status  New    Target Date  11/13/18      PT LONG TERM GOAL #4   Title  Pt will improve FOTO score to no more than 35% limited.     Time  8    Period  Weeks    Status  New    Target Date  11/13/18            Plan - 10/03/18 1025    Clinical Impression Statement  Patient here for first treatment session.  She tried to exercise (HEP) but it did not "go well" .  Had min difficulty  today with HEP but able to do with cues.  MRI done and showed a progression of her L4-L5 disc but she does not know results yet.   No muscle spasm with manual in prone to lumbar or Rt hip.  Trial of IFC for chronic pain.      PT Treatment/Interventions  ADLs/Self Care Home Management;Cryotherapy;Traction;Moist Heat;Electrical Stimulation;Gait training;Therapeutic exercise;Therapeutic activities;Functional mobility training;Neuromuscular re-education;Patient/family education;Manual techniques;Taping    PT Next Visit Plan  Check HEP, modalities for pain, progress core     PT Home Exercise Plan  LTR,  piriformis, hamstring , abdominal set     Consulted and Agree with Plan of Care  Patient       Patient will benefit from skilled therapeutic intervention in order to improve the following deficits and impairments:  Decreased endurance, Decreased mobility, Hypomobility, Obesity, Pain, Increased fascial restricitons, Impaired flexibility, Impaired UE functional use, Postural dysfunction, Decreased strength, Decreased activity tolerance, Decreased range of motion, Difficulty walking, Increased muscle spasms, Impaired sensation  Visit Diagnosis: Radiculopathy, lumbar region  Chronic midline low back pain with sciatica, sciatica laterality unspecified  Difficulty in walking, not elsewhere classified     Problem List Patient Active Problem List   Diagnosis Date Noted  . Neuropathic pain 09/04/2018  . Chronic radicular pain of lower back 09/04/2018  . Lumbar foraminal stenosis-L5-S1 09/04/2018  . Osteoarthritis of spine with radiculopathy, lumbar region 09/04/2018  . Obesity, Class I, BMI 30-34.9 09/04/2018  . Piriformis syndrome, left 07/12/2018  . Single episode of elevated blood pressure 07/12/2018  . Hypertriglyceridemia 04/11/2018  . Low level of high density lipoprotein (HDL) 04/11/2018  . Vitamin D insufficiency 04/11/2018  . Epigastric abdominal pain with N 04/06/2018  . History of vitamin D deficiency 04/06/2018  . Spondylosis without myelopathy or radiculopathy, lumbar region 10/03/2017  . Acute folliculitis- likely due to MRSA, they are draining 06/19/2017  . Personal history of MRSA (methicillin resistant Staphylococcus aureus) 06/19/2017  . Chronic diarrhea  06/19/2017  . Contact dermatitis and eczema- due to tape\ Band-Aid 06/19/2017  . Abdominal pain, LUQ 05/02/2017  . Abdominal pain, LLQ 05/02/2017  . Hx of diarrhea 05/02/2017  . Environmental and seasonal allergies 09/29/2016  . GERD (gastroesophageal reflux disease) 09/29/2016  . Bipolar 1 disorder - Anxiety  09/28/2016  . Fibromyalgia 08/11/2016  . Diabetes mellitus, type II ([place]) 12/29/2015  . OSA on CPAP 12/08/2014  . Obstructive sleep apnea syndrome, moderate 11/24/2014  . Fatty liver 09/05/2014  . S/P abdominal hysterectomy 08/15/2014  . Bladder dysfunction 04/30/2014  . Nephrolithiasis 03/04/2014  . Agenesis, corpus callosum ([place]) 02/17/2014  . GAD (generalized anxiety disorder) 02/17/2014  . History of blood transfusion 02/17/2014  . Obesity (BMI 30-39.9) 02/17/2014    Sereniti Wan 10/03/2018, 1:25 PM  [facility] [address] [place], [place], [address] Phone: [phone]   Fax:  [phone]  Name: KAVERI PERRAS MRN: [id number] Date of Birth: [birth year]   Raeford Razor, PT 10/03/18 1:25 PM Phone: [phone] Fax: [phone]

## 2018-10-03 NOTE — Patient Instructions (Signed)
Step 1  Supine Transversus Abdominis Bracing with Pelvic Floor Contraction reps: 10  sets: 1  hold: 10  daily: 3  weekly: 7 Setup  Begin lying on your back with your knees bent and feet resting flat on the floor. Movement  Tighten your abdominal muscles and at the same time contract your pelvic floor muscles. Hold, then relax and repeat. Tip  Make sure to continue breathing evenly during the exercise.

## 2018-10-08 DIAGNOSIS — R030 Elevated blood-pressure reading, without diagnosis of hypertension: Secondary | ICD-10-CM | POA: Diagnosis not present

## 2018-10-08 DIAGNOSIS — M5137 Other intervertebral disc degeneration, lumbosacral region: Secondary | ICD-10-CM | POA: Diagnosis not present

## 2018-10-09 ENCOUNTER — Ambulatory Visit: Payer: PPO | Attending: Family Medicine | Admitting: Physical Therapy

## 2018-10-09 DIAGNOSIS — R262 Difficulty in walking, not elsewhere classified: Secondary | ICD-10-CM | POA: Insufficient documentation

## 2018-10-09 DIAGNOSIS — M5416 Radiculopathy, lumbar region: Secondary | ICD-10-CM

## 2018-10-09 DIAGNOSIS — G8929 Other chronic pain: Secondary | ICD-10-CM | POA: Diagnosis not present

## 2018-10-09 DIAGNOSIS — M5440 Lumbago with sciatica, unspecified side: Secondary | ICD-10-CM | POA: Insufficient documentation

## 2018-10-09 NOTE — Therapy (Signed)
[place], [place], [address] Phone: [phone]   Fax:  [phone]  Physical Therapy Treatment  Patient Details  Name: Samantha [name] MRN: [id number] Date of Birth: February 18, 1981 Referring Provider (PT): Dr. Raliegh Scarlet   Encounter Date: 10/09/2018  PT End of Session - 10/09/18 0934    Visit Number  3    Number of Visits  16    Date for PT Re-Evaluation  11/13/18    PT Start Time  0932    PT Stop Time  1020    PT Time Calculation (min)  48 min       Past Medical History:  Diagnosis Date  . Anemia   . Anxiety   . Depression    rx presc but not taken  . Diabetes mellitus without complication ([place])   . Fibromyalgia   . GERD (gastroesophageal reflux disease)    no meds  . Headache(784.0)    tension and with anxiety hx  . Hepatic steatosis   . History of kidney stones   . MVP (mitral valve prolapse)    echo 10  . Pneumonia    hx  . Seizures ([place])    non epileptic events per epilepsy monitoring unit  . Seizures ([place])    last seizure July 2015- does not convulse but has a sleep effect  . Sinus infection    recent on antibiotic  . Sleep apnea    has cpap does not use    Past Surgical History:  Procedure Laterality Date  . ABDOMINAL HYSTERECTOMY N/A 08/15/2014   Procedure: HYSTERECTOMY ABDOMINAL WITH CYSTOSCOPY;  Surgeon: Sanjuana Kava, MD;  Location: [facility];  Service: Gynecology;  Laterality: N/A;  . CESAREAN SECTION N/A   . SHOULDER ARTHROSCOPY WITH SUBACROMIAL DECOMPRESSION AND OPEN ROTATOR C Left 11/27/2015   Procedure: LEFT SHOULDER ARTHROSCOPY WITH SUBACROMIAL DECOMPRESSION, MINI OPEN ROTATOR CUFF REPAIR;  Surgeon: Netta Cedars, MD;  Location: [facility];  Service: Orthopedics;  Laterality: Left;  . TUBAL LIGATION      There were no vitals filed for this visit.  Subjective Assessment - 10/09/18 0936    Subjective  Its a solid 8/10. Electrical stim was helpful. MD said MRI did not reveal anything  that was causing her increased pain.    Currently in Pain?  Yes    Pain Score  8     Pain Location  Back    Pain Orientation  Lower    Pain Descriptors / Indicators  Aching;Tightness    Aggravating Factors   activity    Pain Relieving Factors  reposition, meds, IFC                       [facility] Adult PT Treatment/Exercise - 10/09/18 0001      Exercises   Exercises  Lumbar      Lumbar Exercises: Stretches   Active Hamstring Stretch  3 reps;30 seconds    Single Knee to Chest Stretch  3 reps;30 seconds    Lower Trunk Rotation  10 seconds    Lower Trunk Rotation Limitations  x 10     Pelvic Tilt  10 reps    Piriformis Stretch  3 reps;30 seconds    Figure 4 Stretch  3 reps;30 seconds      Lumbar Exercises: Supine   Ab Set  10 reps    Clam  10 reps;20 reps   maintaining PPT   Clam Limitations  bilateral and unilateral  x 10 each     Bridge  10 reps    Bridge Limitations  with initial pelvic tilit      Moist Heat Therapy   Number Minutes Moist Heat  15 Minutes    Moist Heat Location  Lumbar Spine      Electrical Stimulation   Electrical Stimulation Location  lumbar     Electrical Stimulation Action  IFC     Electrical Stimulation Parameters  to tolerance     Electrical Stimulation Goals  Pain               PT Short Term Goals - 10/03/18 1319      PT SHORT TERM GOAL #1   Title  Pt will be I with initial HEP     Baseline  has not done consistently     Status  On-going      PT SHORT TERM GOAL #2   Title  Pt will be able to increase her ability to sit comfortably in her home for meals, rest, requiring less repositioning.     Status  On-going      PT SHORT TERM GOAL #3   Title  Pt will be able to demo proper lifting, transition from mat to sitting with no increase in pain.     Status  On-going        PT Long Term Goals - 09/18/18 1305      PT LONG TERM GOAL #1   Title  Pt will be able to walk with her mom for 30 min with min increase in pain  from baseline.     Time  8    Period  Weeks    Status  New    Target Date  11/13/18      PT LONG TERM GOAL #2   Title  Pt will be able to report min LE radicular symptoms to ease her ability to walk, engage with her kids.     Time  8    Period  Weeks    Status  New    Target Date  11/13/18      PT LONG TERM GOAL #3   Title  Pt will be I with more advanced HEP     Time  8    Period  Weeks    Status  New    Target Date  11/13/18      PT LONG TERM GOAL #4   Title  Pt will improve FOTO score to no more than 35% limited.     Time  8    Period  Weeks    Status  New    Target Date  11/13/18            Plan - 10/09/18 1106    Clinical Impression Statement  Pt reports MRI did not reveal anything that was causing her pain per MD.  Pt reports trunk rotations sent shooting pain from foot to head and are "catching". We reviewed HEP focusing on gentle stretching into trunk rotation. Instructed her in bookopenings which she performed well without "catching." Also no pain with bridging.  Discussed the need to improve her rotation flexibility/ROM to allow more painfree movements/transitions. Encouraged her to return to walking short distances.     PT Next Visit Plan  Check HEP, modalities for pain, progress core , add bridge and book openings to HEP if she did well after last visit.     PT Home Exercise Plan  LTR,  piriformis, hamstring , abdominal set     Consulted and Agree with Plan of Care  Patient       Patient will benefit from skilled therapeutic intervention in order to improve the following deficits and impairments:  Decreased endurance, Decreased mobility, Hypomobility, Obesity, Pain, Increased fascial restricitons, Impaired flexibility, Impaired UE functional use, Postural dysfunction, Decreased strength, Decreased activity tolerance, Decreased range of motion, Difficulty walking, Increased muscle spasms, Impaired sensation  Visit Diagnosis: Radiculopathy, lumbar  region  Chronic midline low back pain with sciatica, sciatica laterality unspecified  Difficulty in walking, not elsewhere classified     Problem List Patient Active Problem List   Diagnosis Date Noted  . Neuropathic pain 09/04/2018  . Chronic radicular pain of lower back 09/04/2018  . Lumbar foraminal stenosis-L5-S1 09/04/2018  . Osteoarthritis of spine with radiculopathy, lumbar region 09/04/2018  . Obesity, Class I, BMI 30-34.9 09/04/2018  . Piriformis syndrome, left 07/12/2018  . Single episode of elevated blood pressure 07/12/2018  . Hypertriglyceridemia 04/11/2018  . Low level of high density lipoprotein (HDL) 04/11/2018  . Vitamin D insufficiency 04/11/2018  . Epigastric abdominal pain with N 04/06/2018  . History of vitamin D deficiency 04/06/2018  . Spondylosis without myelopathy or radiculopathy, lumbar region 10/03/2017  . Acute folliculitis- likely due to MRSA, they are draining 06/19/2017  . Personal history of MRSA (methicillin resistant Staphylococcus aureus) 06/19/2017  . Chronic diarrhea  06/19/2017  . Contact dermatitis and eczema- due to tape\ Band-Aid 06/19/2017  . Abdominal pain, LUQ 05/02/2017  . Abdominal pain, LLQ 05/02/2017  . Hx of diarrhea 05/02/2017  . Environmental and seasonal allergies 09/29/2016  . GERD (gastroesophageal reflux disease) 09/29/2016  . Bipolar 1 disorder - Anxiety 09/28/2016  . Fibromyalgia 08/11/2016  . Diabetes mellitus, type II ([place]) 12/29/2015  . OSA on CPAP 12/08/2014  . Obstructive sleep apnea syndrome, moderate 11/24/2014  . Fatty liver 09/05/2014  . S/P abdominal hysterectomy 08/15/2014  . Bladder dysfunction 04/30/2014  . Nephrolithiasis 03/04/2014  . Agenesis, corpus callosum ([place]) 02/17/2014  . GAD (generalized anxiety disorder) 02/17/2014  . History of blood transfusion 02/17/2014  . Obesity (BMI 30-39.9) 02/17/2014    Dorene Ar, PTA 10/09/2018, 11:14 AM  [place], [place], [address] Phone: [phone]   Fax:  [phone]  Name: Samantha [name] MRN: [id number] Date of Birth: August 17, 1981

## 2018-10-10 ENCOUNTER — Other Ambulatory Visit: Payer: Self-pay | Admitting: Neurosurgery

## 2018-10-10 DIAGNOSIS — M5137 Other intervertebral disc degeneration, lumbosacral region: Secondary | ICD-10-CM

## 2018-10-11 ENCOUNTER — Ambulatory Visit: Payer: PPO | Admitting: Physical Therapy

## 2018-10-11 ENCOUNTER — Encounter: Payer: Self-pay | Admitting: Physical Therapy

## 2018-10-11 DIAGNOSIS — G8929 Other chronic pain: Secondary | ICD-10-CM

## 2018-10-11 DIAGNOSIS — R262 Difficulty in walking, not elsewhere classified: Secondary | ICD-10-CM

## 2018-10-11 DIAGNOSIS — M5416 Radiculopathy, lumbar region: Secondary | ICD-10-CM

## 2018-10-11 DIAGNOSIS — M5440 Lumbago with sciatica, unspecified side: Secondary | ICD-10-CM

## 2018-10-11 NOTE — Therapy (Signed)
[place], [place], [address] Phone: [phone]   Fax:  [phone]  Physical Therapy Treatment  Patient Details  Name: Samantha [name] MRN: [id number] Date of Birth: Feb 14, 1981 Referring Provider (PT): Dr. Raliegh Scarlet   Encounter Date: 10/11/2018  PT End of Session - 10/11/18 0938    Visit Number  4    Number of Visits  16    Date for PT Re-Evaluation  11/13/18    PT Start Time  0930    PT Stop Time  1030    PT Time Calculation (min)  60 min       Past Medical History:  Diagnosis Date  . Anemia   . Anxiety   . Depression    rx presc but not taken  . Diabetes mellitus without complication ([place])   . Fibromyalgia   . GERD (gastroesophageal reflux disease)    no meds  . Headache(784.0)    tension and with anxiety hx  . Hepatic steatosis   . History of kidney stones   . MVP (mitral valve prolapse)    echo 10  . Pneumonia    hx  . Seizures ([place])    non epileptic events per epilepsy monitoring unit  . Seizures ([place])    last seizure July 2015- does not convulse but has a sleep effect  . Sinus infection    recent on antibiotic  . Sleep apnea    has cpap does not use    Past Surgical History:  Procedure Laterality Date  . ABDOMINAL HYSTERECTOMY N/A 08/15/2014   Procedure: HYSTERECTOMY ABDOMINAL WITH CYSTOSCOPY;  Surgeon: Sanjuana Kava, MD;  Location: [facility];  Service: Gynecology;  Laterality: N/A;  . CESAREAN SECTION N/A   . SHOULDER ARTHROSCOPY WITH SUBACROMIAL DECOMPRESSION AND OPEN ROTATOR C Left 11/27/2015   Procedure: LEFT SHOULDER ARTHROSCOPY WITH SUBACROMIAL DECOMPRESSION, MINI OPEN ROTATOR CUFF REPAIR;  Surgeon: Netta Cedars, MD;  Location: [facility];  Service: Orthopedics;  Laterality: Left;  . TUBAL LIGATION      There were no vitals filed for this visit.  Subjective Assessment - 10/11/18 0933    Subjective  No pain at this moment. Had burning pain later in the day after last treatment that  resolved after 30 minutes. Most difficulty with sleep positions.     Currently in Pain?  No/denies                       [facility] Adult PT Treatment/Exercise - 10/11/18 0001      Exercises   Exercises  Lumbar      Lumbar Exercises: Stretches   Active Hamstring Stretch  3 reps;30 seconds    Single Knee to Chest Stretch  3 reps;30 seconds    Lower Trunk Rotation  10 seconds    Pelvic Tilt  10 reps    Piriformis Stretch  3 reps;30 seconds    Figure 4 Stretch  3 reps;30 seconds      Lumbar Exercises: Aerobic   Nustep  L5 UE/LE x 5 minutes       Lumbar Exercises: Standing   Functional Squats  10 reps    Other Standing Lumbar Exercises  standing 3 way hp x 10 each bilateral      Lumbar Exercises: Supine   Clam  10 reps;20 reps   maintaining PPT   Clam Limitations  bilateral and unilateral x 10 each     Bent Knee Raise  10 reps  Bent Knee Raise Limitations  increased pain    Bridge  10 reps    Bridge Limitations  with initial pelvic tilit      Moist Heat Therapy   Number Minutes Moist Heat  15 Minutes    Moist Heat Location  Lumbar Spine      Electrical Stimulation   Electrical Stimulation Location  lumbar     Electrical Stimulation Action  IFC    Electrical Stimulation Parameters   to tolerance    Electrical Stimulation Goals  Pain             PT Education - 10/11/18 1017    Education Details  HEP    Person(s) Educated  Patient    Methods  Explanation;Handout    Comprehension  Verbalized understanding       PT Short Term Goals - 10/03/18 1319      PT SHORT TERM GOAL #1   Title  Pt will be I with initial HEP     Baseline  has not done consistently     Status  On-going      PT SHORT TERM GOAL #2   Title  Pt will be able to increase her ability to sit comfortably in her home for meals, rest, requiring less repositioning.     Status  On-going      PT SHORT TERM GOAL #3   Title  Pt will be able to demo proper lifting, transition from mat to  sitting with no increase in pain.     Status  On-going        PT Long Term Goals - 09/18/18 1305      PT LONG TERM GOAL #1   Title  Pt will be able to walk with her mom for 30 min with min increase in pain from baseline.     Time  8    Period  Weeks    Status  New    Target Date  11/13/18      PT LONG TERM GOAL #2   Title  Pt will be able to report min LE radicular symptoms to ease her ability to walk, engage with her kids.     Time  8    Period  Weeks    Status  New    Target Date  11/13/18      PT LONG TERM GOAL #3   Title  Pt will be I with more advanced HEP     Time  8    Period  Weeks    Status  New    Target Date  11/13/18      PT LONG TERM GOAL #4   Title  Pt will improve FOTO score to no more than 35% limited.     Time  8    Period  Weeks    Status  New    Target Date  11/13/18            Plan - 10/11/18 0936    Clinical Impression Statement  Pt arrives without pain. Pt reports her pain is okay during the day. She walked up and [place] a large [place] on her property and did well. Added in closed chain without increased pain. Reviewed core exercises from last visit. Progressed to supine marching and she had increased lumbar pain. Updated HEP with pain free core.     PT Next Visit Plan  Check HEP, modalities for pain, progress core , add bridge and book openings to  HEP if she did well after last visit.     PT Home Exercise Plan  LTR, piriformis, hamstring , abdominal set , bridge and book opening.     Consulted and Agree with Plan of Care  Patient       Patient will benefit from skilled therapeutic intervention in order to improve the following deficits and impairments:  Decreased endurance, Decreased mobility, Hypomobility, Obesity, Pain, Increased fascial restricitons, Impaired flexibility, Impaired UE functional use, Postural dysfunction, Decreased strength, Decreased activity tolerance, Decreased range of motion, Difficulty walking, Increased muscle spasms,  Impaired sensation  Visit Diagnosis: Radiculopathy, lumbar region  Chronic midline low back pain with sciatica, sciatica laterality unspecified  Difficulty in walking, not elsewhere classified     Problem List Patient Active Problem List   Diagnosis Date Noted  . Neuropathic pain 09/04/2018  . Chronic radicular pain of lower back 09/04/2018  . Lumbar foraminal stenosis-L5-S1 09/04/2018  . Osteoarthritis of spine with radiculopathy, lumbar region 09/04/2018  . Obesity, Class I, BMI 30-34.9 09/04/2018  . Piriformis syndrome, left 07/12/2018  . Single episode of elevated blood pressure 07/12/2018  . Hypertriglyceridemia 04/11/2018  . Low level of high density lipoprotein (HDL) 04/11/2018  . Vitamin D insufficiency 04/11/2018  . Epigastric abdominal pain with N 04/06/2018  . History of vitamin D deficiency 04/06/2018  . Spondylosis without myelopathy or radiculopathy, lumbar region 10/03/2017  . Acute folliculitis- likely due to MRSA, they are draining 06/19/2017  . Personal history of MRSA (methicillin resistant Staphylococcus aureus) 06/19/2017  . Chronic diarrhea  06/19/2017  . Contact dermatitis and eczema- due to tape\ Band-Aid 06/19/2017  . Abdominal pain, LUQ 05/02/2017  . Abdominal pain, LLQ 05/02/2017  . Hx of diarrhea 05/02/2017  . Environmental and seasonal allergies 09/29/2016  . GERD (gastroesophageal reflux disease) 09/29/2016  . Bipolar 1 disorder - Anxiety 09/28/2016  . Fibromyalgia 08/11/2016  . Diabetes mellitus, type II ([place]) 12/29/2015  . OSA on CPAP 12/08/2014  . Obstructive sleep apnea syndrome, moderate 11/24/2014  . Fatty liver 09/05/2014  . S/P abdominal hysterectomy 08/15/2014  . Bladder dysfunction 04/30/2014  . Nephrolithiasis 03/04/2014  . Agenesis, corpus callosum ([place]) 02/17/2014  . GAD (generalized anxiety disorder) 02/17/2014  . History of blood transfusion 02/17/2014  . Obesity (BMI 30-39.9) 02/17/2014    Dorene Ar,  PTA 10/11/2018, 10:45 AM  [place], [place], [address] Phone: [phone]   Fax:  [phone]  Name: Samantha [name] MRN: [id number] Date of Birth: 11-Jun-1981

## 2018-10-12 ENCOUNTER — Ambulatory Visit: Payer: PPO | Admitting: Family Medicine

## 2018-10-16 ENCOUNTER — Ambulatory Visit: Payer: PPO | Admitting: Physical Therapy

## 2018-10-16 ENCOUNTER — Encounter: Payer: Self-pay | Admitting: Physical Therapy

## 2018-10-16 DIAGNOSIS — M5416 Radiculopathy, lumbar region: Secondary | ICD-10-CM

## 2018-10-16 DIAGNOSIS — G8929 Other chronic pain: Secondary | ICD-10-CM

## 2018-10-16 DIAGNOSIS — M5440 Lumbago with sciatica, unspecified side: Secondary | ICD-10-CM

## 2018-10-16 DIAGNOSIS — R262 Difficulty in walking, not elsewhere classified: Secondary | ICD-10-CM

## 2018-10-16 NOTE — Therapy (Signed)
[place], [place], [address] Phone: [phone]   Fax:  [phone]  Physical Therapy Treatment  Patient Details  Name: Samantha [name] MRN: [id number] Date of Birth: [birth year] Referring Provider (PT): Dr. Raliegh Scarlet   Encounter Date: 10/16/2018  PT End of Session - 10/16/18 1007    Visit Number  5    Number of Visits  16    Date for PT Re-Evaluation  11/13/18    PT Start Time  0940    PT Stop Time  1029    PT Time Calculation (min)  49 min    Activity Tolerance  Patient tolerated treatment well    Behavior During Therapy  [facility] for tasks assessed/performed       Past Medical History:  Diagnosis Date  . Anemia   . Anxiety   . Depression    rx presc but not taken  . Diabetes mellitus without complication ([place])   . Fibromyalgia   . GERD (gastroesophageal reflux disease)    no meds  . Headache(784.0)    tension and with anxiety hx  . Hepatic steatosis   . History of kidney stones   . MVP (mitral valve prolapse)    echo 10  . Pneumonia    hx  . Seizures ([place])    non epileptic events per epilepsy monitoring unit  . Seizures ([place])    last seizure July 2015- does not convulse but has a sleep effect  . Sinus infection    recent on antibiotic  . Sleep apnea    has cpap does not use    Past Surgical History:  Procedure Laterality Date  . ABDOMINAL HYSTERECTOMY N/A 08/15/2014   Procedure: HYSTERECTOMY ABDOMINAL WITH CYSTOSCOPY;  Surgeon: Sanjuana Kava, MD;  Location: [facility];  Service: Gynecology;  Laterality: N/A;  . CESAREAN SECTION N/A   . SHOULDER ARTHROSCOPY WITH SUBACROMIAL DECOMPRESSION AND OPEN ROTATOR C Left 11/27/2015   Procedure: LEFT SHOULDER ARTHROSCOPY WITH SUBACROMIAL DECOMPRESSION, MINI OPEN ROTATOR CUFF REPAIR;  Surgeon: Netta Cedars, MD;  Location: [facility];  Service: Orthopedics;  Laterality: Left;  . TUBAL LIGATION      There were no vitals filed for this visit.  Subjective  Assessment - 10/16/18 0941    Subjective  I am really hurting today.  It hurts and it usually goes down by this time.      Currently in Pain?  Yes    Pain Score  9     Pain Location  Back    Pain Orientation  Lower    Pain Descriptors / Indicators  Aching;Sore;Sharp    Pain Type  Chronic pain    Pain Onset  More than a month ago    Pain Frequency  Constant    Aggravating Factors   moving a certain way     Pain Relieving Factors  meds, change positions         [facility] Adult PT Treatment/Exercise - 10/16/18 0001      Lumbar Exercises: Stretches   Piriformis Stretch  3 reps;30 seconds    Piriformis Stretch Limitations  knee to opp shoulder    Other Lumbar Stretch Exercise  knee over knee , rotate to L/R       Lumbar Exercises: Supine   Clam  10 reps;20 reps   maintaining PPT   Clam Limitations  bilateral and unilateral x 10 each     Bent Knee Raise  10 reps    Bent Knee  Raise Limitations  increased pain    Bridge  10 reps    Bridge Limitations  with initial pelvic tilit      Moist Heat Therapy   Number Minutes Moist Heat  15 Minutes    Moist Heat Location  Lumbar Spine      Electrical Stimulation   Electrical Stimulation Location  lumbar     Electrical Stimulation Action  IFC    Electrical Stimulation Parameters  To tol     Electrical Stimulation Goals  Pain         PT Education - 10/16/18 1123    Education Details  stabilization    Person(s) Educated  Patient    Methods  Explanation    Comprehension  Verbalized understanding       PT Short Term Goals - 10/16/18 1124      PT SHORT TERM GOAL #1   Title  Pt will be I with initial HEP     Baseline  has not done consistently     Status  On-going      PT SHORT TERM GOAL #2   Title  Pt will be able to increase her ability to sit comfortably in her home for meals, rest, requiring less repositioning.     Status  On-going      PT SHORT TERM GOAL #3   Title  Pt will be able to demo proper lifting, transition from mat  to sitting with no increase in pain.     Status  On-going        PT Long Term Goals - 09/18/18 1305      PT LONG TERM GOAL #1   Title  Pt will be able to walk with her mom for 30 min with min increase in pain from baseline.     Time  8    Period  Weeks    Status  New    Target Date  11/13/18      PT LONG TERM GOAL #2   Title  Pt will be able to report min LE radicular symptoms to ease her ability to walk, engage with her kids.     Time  8    Period  Weeks    Status  New    Target Date  11/13/18      PT LONG TERM GOAL #3   Title  Pt will be I with more advanced HEP     Time  8    Period  Weeks    Status  New    Target Date  11/13/18      PT LONG TERM GOAL #4   Title  Pt will improve FOTO score to no more than 35% limited.     Time  8    Period  Weeks    Status  New    Target Date  11/13/18            Plan - 10/16/18 1007    Clinical Impression Statement  Pt with increased pain due to prolonged sitting last night in a hard chair.  Able to decrease pain to "not that bad at all" after simple stretches on the mat.  MD interpreted MRI and mentioned small disc protrusion .  Educated on stabilization as the main treatment.  Advised to stand tonight at her class a few times as she feels her pain increase.     PT Treatment/Interventions  ADLs/Self Care Home Management;Cryotherapy;Traction;Moist Heat;Electrical Stimulation;Gait training;Therapeutic exercise;Therapeutic activities;Functional mobility training;Neuromuscular re-education;Patient/family  education;Manual techniques;Taping    PT Next Visit Plan  Check HEP, modalities for pain, progress core , add bridge and book openings to HEP?    PT Home Exercise Plan  LTR, piriformis, hamstring , abdominal set , bridge and book opening ( not adding yet)     Consulted and Agree with Plan of Care  Patient       Patient will benefit from skilled therapeutic intervention in order to improve the following deficits and impairments:   Decreased endurance, Decreased mobility, Hypomobility, Obesity, Pain, Increased fascial restricitons, Impaired flexibility, Impaired UE functional use, Postural dysfunction, Decreased strength, Decreased activity tolerance, Decreased range of motion, Difficulty walking, Increased muscle spasms, Impaired sensation  Visit Diagnosis: Radiculopathy, lumbar region  Chronic midline low back pain with sciatica, sciatica laterality unspecified  Difficulty in walking, not elsewhere classified     Problem List Patient Active Problem List   Diagnosis Date Noted  . Neuropathic pain 09/04/2018  . Chronic radicular pain of lower back 09/04/2018  . Lumbar foraminal stenosis-L5-S1 09/04/2018  . Osteoarthritis of spine with radiculopathy, lumbar region 09/04/2018  . Obesity, Class I, BMI 30-34.9 09/04/2018  . Piriformis syndrome, left 07/12/2018  . Single episode of elevated blood pressure 07/12/2018  . Hypertriglyceridemia 04/11/2018  . Low level of high density lipoprotein (HDL) 04/11/2018  . Vitamin D insufficiency 04/11/2018  . Epigastric abdominal pain with N 04/06/2018  . History of vitamin D deficiency 04/06/2018  . Spondylosis without myelopathy or radiculopathy, lumbar region 10/03/2017  . Acute folliculitis- likely due to MRSA, they are draining 06/19/2017  . Personal history of MRSA (methicillin resistant Staphylococcus aureus) 06/19/2017  . Chronic diarrhea  06/19/2017  . Contact dermatitis and eczema- due to tape\ Band-Aid 06/19/2017  . Abdominal pain, LUQ 05/02/2017  . Abdominal pain, LLQ 05/02/2017  . Hx of diarrhea 05/02/2017  . Environmental and seasonal allergies 09/29/2016  . GERD (gastroesophageal reflux disease) 09/29/2016  . Bipolar 1 disorder - Anxiety 09/28/2016  . Fibromyalgia 08/11/2016  . Diabetes mellitus, type II ([place]) 12/29/2015  . OSA on CPAP 12/08/2014  . Obstructive sleep apnea syndrome, moderate 11/24/2014  . Fatty liver 09/05/2014  . S/P abdominal  hysterectomy 08/15/2014  . Bladder dysfunction 04/30/2014  . Nephrolithiasis 03/04/2014  . Agenesis, corpus callosum ([place]) 02/17/2014  . GAD (generalized anxiety disorder) 02/17/2014  . History of blood transfusion 02/17/2014  . Obesity (BMI 30-39.9) 02/17/2014    Timotheus Salm 10/16/2018, 11:25 AM  [facility] [address] [place], [place], [address] Phone: [phone]   Fax:  [phone]  Name: EVADENE WARDRIP MRN: [id number] Date of Birth: 12/19/1980  Raeford Razor, PT 10/16/18 11:25 AM Phone: [phone] Fax: [phone]

## 2018-10-18 ENCOUNTER — Ambulatory Visit
Admission: RE | Admit: 2018-10-18 | Discharge: 2018-10-18 | Disposition: A | Discharge status: Home / Self Care | Payer: PPO | Source: Ambulatory Visit | Attending: Neurosurgery | Admitting: Neurosurgery

## 2018-10-18 ENCOUNTER — Ambulatory Visit: Payer: PPO | Admitting: Physical Therapy

## 2018-10-18 DIAGNOSIS — M47817 Spondylosis without myelopathy or radiculopathy, lumbosacral region: Secondary | ICD-10-CM | POA: Diagnosis not present

## 2018-10-18 DIAGNOSIS — M5137 Other intervertebral disc degeneration, lumbosacral region: Secondary | ICD-10-CM

## 2018-10-18 MED ORDER — METHYLPREDNISOLONE ACETATE 40 MG/ML INJ SUSP (RADIOLOG
120.0000 mg | Freq: Once | INTRAMUSCULAR | Status: AC
  Administered 2018-10-18: 120 mg via EPIDURAL

## 2018-10-18 MED ORDER — IOPAMIDOL (ISOVUE-M 200) INJECTION 41%
1.0000 mL | Freq: Once | INTRAMUSCULAR | Status: AC
  Administered 2018-10-18: 1 mL via EPIDURAL

## 2018-10-18 NOTE — Discharge Instructions (Signed)

## 2018-10-23 ENCOUNTER — Ambulatory Visit: Payer: PPO | Admitting: Physical Therapy

## 2018-10-24 ENCOUNTER — Telehealth: Payer: Self-pay | Admitting: Physical Therapy

## 2018-10-24 NOTE — Telephone Encounter (Signed)
Spoke to patient regarding missed appointment. She reports being sick and forgot to call to cancel. She plans to attend her next appointment.

## 2018-10-25 ENCOUNTER — Ambulatory Visit: Payer: PPO | Admitting: Physical Therapy

## 2018-11-06 ENCOUNTER — Encounter: Payer: Self-pay | Admitting: Physical Therapy

## 2018-11-06 ENCOUNTER — Ambulatory Visit: Payer: PPO | Attending: Family Medicine | Admitting: Physical Therapy

## 2018-11-06 DIAGNOSIS — M5440 Lumbago with sciatica, unspecified side: Secondary | ICD-10-CM | POA: Insufficient documentation

## 2018-11-06 DIAGNOSIS — M5416 Radiculopathy, lumbar region: Secondary | ICD-10-CM

## 2018-11-06 DIAGNOSIS — R262 Difficulty in walking, not elsewhere classified: Secondary | ICD-10-CM | POA: Diagnosis not present

## 2018-11-06 DIAGNOSIS — G8929 Other chronic pain: Secondary | ICD-10-CM | POA: Diagnosis not present

## 2018-11-06 NOTE — Therapy (Addendum)
[place], [place], [address] Phone: [phone]   Fax:  [phone]  Physical Therapy Treatment/Discharge  Patient Details  Name: Samantha [name] MRN: [id number] Date of Birth: October 05, 1981 Referring Provider (PT): Dr. Raliegh Scarlet   Encounter Date: 11/06/2018  PT End of Session - 11/06/18 0935    Visit Number  6    Number of Visits  16    Date for PT Re-Evaluation  11/13/18    PT Start Time  0930    PT Stop Time  1020    PT Time Calculation (min)  50 min       Past Medical History:  Diagnosis Date  . Anemia   . Anxiety   . Depression    rx presc but not taken  . Diabetes mellitus without complication ([place])   . Fibromyalgia   . GERD (gastroesophageal reflux disease)    no meds  . Headache(784.0)    tension and with anxiety hx  . Hepatic steatosis   . History of kidney stones   . MVP (mitral valve prolapse)    echo 10  . Pneumonia    hx  . Seizures ([place])    non epileptic events per epilepsy monitoring unit  . Seizures ([place])    last seizure July 2015- does not convulse but has a sleep effect  . Sinus infection    recent on antibiotic  . Sleep apnea    has cpap does not use    Past Surgical History:  Procedure Laterality Date  . ABDOMINAL HYSTERECTOMY N/A 08/15/2014   Procedure: HYSTERECTOMY ABDOMINAL WITH CYSTOSCOPY;  Surgeon: Sanjuana Kava, MD;  Location: [facility];  Service: Gynecology;  Laterality: N/A;  . CESAREAN SECTION N/A   . SHOULDER ARTHROSCOPY WITH SUBACROMIAL DECOMPRESSION AND OPEN ROTATOR C Left 11/27/2015   Procedure: LEFT SHOULDER ARTHROSCOPY WITH SUBACROMIAL DECOMPRESSION, MINI OPEN ROTATOR CUFF REPAIR;  Surgeon: Netta Cedars, MD;  Location: [facility];  Service: Orthopedics;  Laterality: Left;  . TUBAL LIGATION      There were no vitals filed for this visit.  Subjective Assessment - 11/06/18 0934    Subjective  I am hurting from my neck down to my toes.     Currently in Pain?  Yes    Pain Score  6     Pain Location  Back   increases to 10/10 right low back   Pain Orientation  Lower;Right    Pain Descriptors / Indicators  Aching;Sore;Sharp    Pain Frequency  Constant    Aggravating Factors   stepping a certain way    Pain Relieving Factors  meds          [facility] PT Assessment - 11/06/18 0001      Observation/Other Assessments   Focus on Therapeutic Outcomes (FOTO)   30% limited, improved from 48% limited       Strength   Overall Strength Comments  WFL in bilateral LEs                    [facility] Adult PT Treatment/Exercise - 11/06/18 0001      Lumbar Exercises: Stretches   Lower Trunk Rotation  10 seconds    Pelvic Tilt  10 reps    Piriformis Stretch  3 reps;30 seconds    Piriformis Stretch Limitations  knee to opp shoulder    Figure 4 Stretch  3 reps;30 seconds      Lumbar Exercises: Aerobic   Nustep  L5  UE/LE x 5 minutes       Lumbar Exercises: Supine   Bridge  10 reps    Bridge Limitations  with initial pelvic tilit      Moist Heat Therapy   Number Minutes Moist Heat  15 Minutes    Moist Heat Location  Lumbar Spine      Electrical Stimulation   Electrical Stimulation Location  lumbar     Electrical Stimulation Action  IFC     Electrical Stimulation Parameters  to tolerance    Electrical Stimulation Goals  Pain               PT Short Term Goals - 11/06/18 1037      PT SHORT TERM GOAL #1   Title  Pt will be I with initial HEP     Baseline  performs intermittently     Time  4    Period  Weeks    Status  Partially Met      PT SHORT TERM GOAL #2   Title  Pt will be able to increase her ability to sit comfortably in her home for meals, rest, requiring less repositioning.     Time  4    Period  Weeks    Status  Achieved      PT SHORT TERM GOAL #3   Title  Pt will be able to demo proper lifting, transition from mat to sitting with no increase in pain.     Baseline  able to demo without increased pain    Time  4     Period  Weeks    Status  Achieved        PT Long Term Goals - 11/06/18 0947      PT LONG TERM GOAL #1   Title  Pt will be able to walk with her mom for 30 min with min increase in pain from baseline.     Baseline  20-25 minutes    Time  8    Period  Weeks    Status  Partially Met      PT LONG TERM GOAL #2   Title  Pt will be able to report min LE radicular symptoms to ease her ability to walk, engage with her kids.     Baseline  leg pain improved, knee pain worse    Time  8    Period  Weeks    Status  Achieved      PT LONG TERM GOAL #3   Title  Pt will be I with more advanced HEP     Time  8    Period  Weeks    Status  Achieved      PT LONG TERM GOAL #4   Title  Pt will improve FOTO score to no more than 35% limited.     Baseline  30% limited     Time  8    Period  Weeks    Status  Achieved            Plan - 11/06/18 0945    Clinical Impression Statement  Pt reports improved tolerance to sitting and is able to sit for meals without shifting. She is walking 1 mile and was able to run one block with her dog. She is still limited by pain and is 6/10 today with intermittent sharp pains up to 10/10. She will see MD Friday. She had met or partially met all goals and is agreeable to discharge  to HEP.     PT Next Visit Plan  discharge to HEP today    PT Home Exercise Plan  LTR, piriformis, hamstring , abdominal set , bridge and book opening, pelvic tilt     Consulted and Agree with Plan of Care  Patient       Patient will benefit from skilled therapeutic intervention in order to improve the following deficits and impairments:  Decreased endurance, Decreased mobility, Hypomobility, Obesity, Pain, Increased fascial restricitons, Impaired flexibility, Impaired UE functional use, Postural dysfunction, Decreased strength, Decreased activity tolerance, Decreased range of motion, Difficulty walking, Increased muscle spasms, Impaired sensation  Visit Diagnosis: Radiculopathy,  lumbar region  Chronic midline low back pain with sciatica, sciatica laterality unspecified  Difficulty in walking, not elsewhere classified     Problem List Patient Active Problem List   Diagnosis Date Noted  . Neuropathic pain 09/04/2018  . Chronic radicular pain of lower back 09/04/2018  . Lumbar foraminal stenosis-L5-S1 09/04/2018  . Osteoarthritis of spine with radiculopathy, lumbar region 09/04/2018  . Obesity, Class I, BMI 30-34.9 09/04/2018  . Piriformis syndrome, left 07/12/2018  . Single episode of elevated blood pressure 07/12/2018  . Hypertriglyceridemia 04/11/2018  . Low level of high density lipoprotein (HDL) 04/11/2018  . Vitamin D insufficiency 04/11/2018  . Epigastric abdominal pain with N 04/06/2018  . History of vitamin D deficiency 04/06/2018  . Spondylosis without myelopathy or radiculopathy, lumbar region 10/03/2017  . Acute folliculitis- likely due to MRSA, they are draining 06/19/2017  . Personal history of MRSA (methicillin resistant Staphylococcus aureus) 06/19/2017  . Chronic diarrhea  06/19/2017  . Contact dermatitis and eczema- due to tape\ Band-Aid 06/19/2017  . Abdominal pain, LUQ 05/02/2017  . Abdominal pain, LLQ 05/02/2017  . Hx of diarrhea 05/02/2017  . Environmental and seasonal allergies 09/29/2016  . GERD (gastroesophageal reflux disease) 09/29/2016  . Bipolar 1 disorder - Anxiety 09/28/2016  . Fibromyalgia 08/11/2016  . Diabetes mellitus, type II ([place]) 12/29/2015  . OSA on CPAP 12/08/2014  . Obstructive sleep apnea syndrome, moderate 11/24/2014  . Fatty liver 09/05/2014  . S/P abdominal hysterectomy 08/15/2014  . Bladder dysfunction 04/30/2014  . Nephrolithiasis 03/04/2014  . 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 GAD (generalized anxiety disorder) 02/17/2014  . History of blood transfusion 02/17/2014  . Obesity (BMI 30-39.9) 02/17/2014    Dorene Ar, PTA 11/06/2018, 10:41 AM  [facility] [address] [place], [place], [address] Phone: [phone]   Fax:  [phone]  Name: XIMENNA FONSECA MRN: [id number] Date of Birth: 19-Jul-1981   PHYSICAL THERAPY DISCHARGE SUMMARY  Visits from Start of Care: 6  Current functional level related to goals / functional outcomes: See above for details.    Remaining deficits: Continues with pain    Education / Equipment: HEP, pain science, core, posture  Plan: Patient agrees to discharge.  Patient goals were met. Patient is being discharged due to meeting the stated rehab goals.  ?????    Raeford Razor, PT 11/06/18 10:58 AM Phone: [phone] Fax: [phone]

## 2018-11-08 ENCOUNTER — Ambulatory Visit (INDEPENDENT_AMBULATORY_CARE_PROVIDER_SITE_OTHER): Payer: PPO | Admitting: Family Medicine

## 2018-11-08 ENCOUNTER — Encounter: Payer: Self-pay | Admitting: Family Medicine

## 2018-11-08 VITALS — BP 132/84 | HR 80 | Temp 98.7°F | Ht 70.0 in | Wt 221.2 lb

## 2018-11-08 DIAGNOSIS — E118 Type 2 diabetes mellitus with unspecified complications: Secondary | ICD-10-CM | POA: Diagnosis not present

## 2018-11-08 DIAGNOSIS — Z789 Other specified health status: Secondary | ICD-10-CM | POA: Diagnosis not present

## 2018-11-08 DIAGNOSIS — E669 Obesity, unspecified: Secondary | ICD-10-CM

## 2018-11-08 LAB — POCT GLYCOSYLATED HEMOGLOBIN (HGB A1C): Hemoglobin A1C: 7 % — AB (ref 4.0–5.6)

## 2018-11-08 NOTE — Patient Instructions (Signed)
The allergy profile we are testing you for his food-grains.  The test includes allergy testing to barley, whole-grain, corn, oat, rice, rye, and wheat  Goal for next time in 4 months is A1c 6.5 or less and weight under 200 pounds!  You can do it by increasing your activity every day to at least 215-minute sessions of speed walking as well as using the lose it app to track what you put in your body.  Behavior Modification Ideas for Weight Management  Weight management involves adopting a healthy lifestyle that includes a knowledge of nutrition and exercise, a positive attitude and the right kind of motivation. Internal motives such as better health, increased energy, self-esteem and personal control increase your chances of lifelong weight management success.  Remember to have realistic goals and think long-term success. Believe in yourself and you can do it. The following information will give you ideas to help you meet your goals.  Control Your Home Environment  Eat only while sitting down at the kitchen or dining room table. Do not eat while watching television, reading, cooking, talking on the phone, standing at the refrigerator or working on the computer. Keep tempting foods out of the house - don't buy them. Keep tempting foods out of sight. Have low-calorie foods ready to eat. Unless you are preparing a meal, stay out of the kitchen. Have healthy snacks at your disposal, such as small pieces of fruit, vegetables, canned fruit, pretzels, low-fat string cheese and nonfat cottage cheese.  Control Your Work Environment  Do not eat at [employer] or keep tempting snacks at your desk. If you get hungry between meals, plan healthy snacks and bring them with you to work. During your breaks, go for a walk instead of eating. If you work around food, plan in advance the one item you will eat at mealtime. Make it inconvenient to nibble on food by chewing gum, sugarless candy or drinking water or  another low-calorie beverage. Do not work through meals. Skipping meals slows down metabolism and may result in overeating at the next meal. If food is available for special occasions, either pick the healthiest item, nibble on low-fat snacks brought from home, don't have anything offered, choose one option and have a small amount, or have only a beverage.  Control Your Mealtime Environment  Serve your plate of food at the stove or kitchen counter. Do not put the serving dishes on the table. If you do put dishes on the table, remove them immediately when finished eating. Fill half of your plate with vegetables, a quarter with lean protein and a quarter with starch. Use smaller plates, bowls and glasses. A smaller portion will look large when it is in a little dish. Politely refuse second helpings. When fixing your plate, limit portions of food to one scoop/serving or less.   Daily Food Management  Replace eating with another activity that you will not associate with food. Wait 20 minutes before eating something you are craving. Drink a large glass of water or diet soda before eating. Always have a big glass or bottle of water to drink throughout the day. Avoid high-calorie add-ons such as cream with your coffee, butter, mayonnaise and salad dressings.  Shopping: Do not shop when hungry or tired. Shop from a list and avoid buying anything that is not on your list. If you must have tempting foods, buy individual-sized packages and try to find a lower-calorie alternative. Don't taste test in the store. Read food labels.  Compare products to help you make the healthiest choices.  Preparation: Chew a piece of gum while cooking meals. Use a quarter teaspoon if you taste test your food. Try to only fix what you are going to eat, leaving yourself no chance for seconds. If you have prepared more food than you need, portion it into individual containers and freeze or refrigerate  immediately. Don't snack while cooking meals.  Eating: Eat slowly. Remember it takes about 20 minutes for your stomach to send a message to your brain that it is full. Don't let fake hunger make you think you need more. The ideal way to eat is to take a bite, put your utensil down, take a sip of water, cut your next bite, take a bit, put your utensil down and so on. Do not cut your food all at one time. Cut only as needed. Take small bites and chew your food well. Stop eating for a minute or two at least once during a meal or snack. Take breaks to reflect and have conversation.  Cleanup and Leftovers: Label leftovers for a specific meal or snack. Freeze or refrigerate individual portions of leftovers. Do not clean up if you are still hungry.  Eating Out and Social Eating  Do not arrive hungry. Eat something light before the meal. Try to fill up on low-calorie foods, such as vegetables and fruit, and eat smaller portions of the high-calorie foods. Eat foods that you like, but choose small portions. If you want seconds, wait at least 20 minutes after you have eaten to see if you are actually hungry or if your eyes are bigger than your stomach. Limit alcoholic beverages. Try a soda water with a twist of lime. Do not skip other meals in the day to save room for the special event.  At Restaurants: Order  la carte rather than buffet style. Order some vegetables or a salad for an appetizer instead of eating bread. If you order a high-calorie dish, share it with someone. Try an after-dinner mint with your coffee. If you do have dessert, share it with two or more people. Don't overeat because you do not want to waste food. Ask for a doggie bag to take extra food home. Tell the server to put half of your entree in a to go bag before the meal is served to you. Ask for salad dressing, gravy or high-fat sauces on the side. Dip the tip of your fork in the dressing before each bite. If bread is  served, ask for only one piece. Try it plain without butter or oil. At [employer] where oil and vinegar is served with bread, use only a small amount of oil and a lot of vinegar for dipping.  At a Friend[facility] House: Offer to bring a dish, appetizer or dessert that is low in calories. Serve yourself small portions or tell the host that you only want a small amount. Stand or sit away from the snack table. Stay away from the kitchen or stay busy if you are near the food. Limit your alcohol intake.  At [employer] and Cafeterias: Cover most of your plate with lettuce and/or vegetables. Use a salad plate instead of a dinner plate. After eating, clear away your dishes before having coffee or tea.  Entertaining at Home: Explore low-fat, low-cholesterol cookbooks. Use single-serving foods like chicken breasts or hamburger patties. Prepare low-calorie appetizers and desserts.   Holidays: Keep tempting foods out of sight. Decorate the house without using food. Have  low-calorie beverages and foods on hand for guests. Allow yourself one planned treat a day. Don't skip meals to save up for the holiday feast. Eat regular, planned meals.   Exercise Well  Make exercise a priority and a planned activity in the day. If possible, walk the entire or part of the distance to work. Get an exercise buddy. Go for a walk with a colleague during one of your breaks, go to the gym, run or take a walk with a friend, walk in the mall with a shopping companion. Park at the end of the parking lot and walk to the store or office entrance. Always take the stairs all of the way or at least part of the way to your floor. If you have a desk job, walk around the office frequently. Do leg lifts while sitting at your desk. Do something outside on the weekends like going for a hike or a bike ride.   Have a Healthy Attitude  Make health your weight management priority. Be realistic. Have a goal to achieve a  healthier you, not necessarily the lowest weight or ideal weight based on calculations or tables. Focus on a healthy eating style, not on dieting. Dieting usually lasts for a short amount of time and rarely produces long-term success. Think long term. You are developing new healthy behaviors to follow next month, in a year and in a decade.    This information is for educational purposes only and is not intended to replace the advice of your doctor or health care provider. We encourage you to discuss with your doctor any questions or concerns you may have.        Guidelines for Losing Weight   We want weight loss that will last so you should lose 1-2 pounds a week.  THAT IS IT! Please pick THREE things a month to change. Once it is a habit check off the item. Then pick another three items off the list to become habits.  If you are already doing a habit on the list GREAT!  Cross that item off!  Don't drink your calories. Ie, alcohol, soda, fruit juice, and sweet tea.   Drink more water. Drink a glass when you feel hungry or before each meal.   Eat breakfast - Complex carb and protein (likeDannon light and fit yogurt, oatmeal, fruit, eggs, [place] bacon).  Measure your cereal.  Eat no more than one cup a day. (ie Kashi)  Eat an apple a day.  Add a vegetable a day.  Try a new vegetable a month.  Use Pam! Stop using oil or butter to cook.  Don't finish your plate or use smaller plates.  Share your dessert.  Eat sugar free Jello for dessert or frozen grapes.  Don't eat 2-3 hours before bed.  Switch to whole wheat bread, pasta, and brown rice.  Make healthier choices when you eat out. No fries!  Pick baked chicken, NOT fried.  Don't forget to SLOW DOWN when you eat. It is not going anywhere.   Take the stairs.  Park far away in the parking lot  Lift soup cans (or weights) for 10 minutes while watching TV.  Walk at work for 10 minutes during break.  Walk outside 1  time a week with your friend, kids, dog, or significant other.  Start a walking group at church.  Walk the mall as much as you can tolerate.   Keep a food diary.  Weigh yourself daily.  Walk for  15 minutes 3 days per week.  Cook at home more often and eat out less. If life happens and you go back to old habits, it is okay.  Just start over. You can do it!  If you experience chest pain, get short of breath, or tired during the exercise, please stop immediately and inform your doctor.    Before you even begin to attack a weight-loss plan, it pays to remember this: You are not fat. You have fat. Losing weight isn't about blame or shame; it's simply another achievement to accomplish. Dieting is like any other skill-you have to buckle down and work at it. As long as you act in a smart, reasonable way, you'll ultimately get where you want to be. Here are some weight loss pearls for you.   1. It's Not a Diet. It's a Lifestyle Thinking of a diet as something you're on and suffering through only for the short term doesn't work. To shed weight and keep it off, you need to make permanent changes to the way you eat. It's OK to indulge occasionally, of course, but if you cut calories temporarily and then revert to your old way of eating, you'll gain back the weight quicker than you can say yo-yo. Use it to lose it. Research shows that one of the best predictors of long-term weight loss is how many pounds you drop in the first month. For that reason, nutritionists often suggest being stricter for the first two weeks of your new eating strategy to build momentum. Cut out added sugar and alcohol and avoid unrefined carbs. After that, figure out how you can reincorporate them in a way that's healthy and maintainable.  2. There's a Right Way to Exercise Working out burns calories and fat and boosts your metabolism by building muscle. But those trying to lose weight are notorious for overestimating the number  of calories they burn and underestimating the amount they take in. Unfortunately, your system is biologically programmed to hold on to extra pounds and that means when you start exercising, your body senses the deficit and ramps up its hunger signals. If you're not diligent, you'll eat everything you burn and then some. Use it, to lose it. Cardio gets all the exercise glory, but strength and interval training are the real heroes. They help you build lean muscle, which in turn increases your metabolism and calorie-burning ability 3. Don't Overreact to Mild Hunger Some people have a hard time losing weight because of hunger anxiety. To them, being hungry is bad-something to be avoided at all costs-so they carry snacks with them and eat when they don't need to. Others eat because they're stressed out or bored. While you never want to get to the point of being ravenous (that's when bingeing is likely to happen), a hunger pang, a craving, or the fact that it's 3:00 p.m. should not send you racing for the vending machine or obsessing about the energy bar in your purse. Ideally, you should put off eating until your stomach is growling and it's difficult to concentrate.  Use it to lose it. When you feel the urge to eat, use the HALT method. Ask yourself, Am I really hungry? Or am I angry or anxious, lonely or bored, or tired? If you're still not certain, try the apple test. If you're truly hungry, an apple should seem delicious; if it doesn't, something else is going on. Or you can try drinking water and making yourself busy, if you are still  hungry try a healthy snack.  4. Not All Calories Are Created Equal The mechanics of weight loss are pretty simple: Take in fewer calories than you use for energy. But the kind of food you eat makes all the difference. Processed food that's high in saturated fat and refined starch or sugar can cause inflammation that disrupts the hormone signals that tell your brain you're full.  The result: You eat a lot more.  Use it to lose it. Clean up your diet. Swap in whole, unprocessed foods, including vegetables, lean protein, and healthy fats that will fill you up and give you the biggest nutritional bang for your calorie buck. In a few weeks, as your brain starts receiving regular hunger and fullness signals once again, you'll notice that you feel less hungry overall and naturally start cutting back on the amount you eat.  5. Protein, Produce, and Plant-Based Fats Are Your Weight-Loss Trinity Here's why eating the three Ps regularly will help you drop pounds. Protein fills you up. You need it to build lean muscle, which keeps your metabolism humming so that you can torch more fat. People in a weight-loss program who ate double the recommended daily allowance for protein (about 110 grams for a 150-pound woman) lost 70 percent of their weight from fat, while people who ate the RDA lost only about 40 percent, one study found. Produce is packed with filling fiber. "It's very difficult to consume too many calories if you're eating a lot of vegetables. Example: Three cups of broccoli is a lot of food, yet only 93 calories. (Fruit is another story. It can be easy to overeat and can contain a lot of calories from sugar, so be sure to monitor your intake.) Plant-based fats like olive oil and those in avocados and nuts are healthy and extra satiating.  Use it to lose it. Aim to incorporate each of the three Ps into every meal and snack. People who eat protein throughout the day are able to keep weight off, according to a study in the [place] of Clinical Nutrition. In addition to meat, poultry and seafood, good sources are beans, lentils, eggs, tofu, and yogurt. As for fat, keep portion sizes in check by measuring out salad dressing, oil, and nut butters (shoot for one to two tablespoons). Finally, eat veggies or a little fruit at every meal. People who did that consumed 308 fewer calories  but didn't feel any hungrier than when they didn't eat more produce.  7. How You Eat Is As Important As What You Eat In order for your brain to register that you're full, you need to focus on what you're eating. Sit down whenever you eat, preferably at a table. Turn off the TV or computer, put down your phone, and look at your food. Smell it. Chew slowly, and don't put another bite on your fork until you swallow. When women ate lunch this attentively, they consumed 30 percent less when snacking later than those who listened to an audiobook at lunchtime, according to a study in the [place] of Nutrition. 8. Weighing Yourself Really Works The scale provides the best evidence about whether your efforts are paying off. Seeing the numbers tick up or down or stagnate is motivation to keep going-or to rethink your approach. A 2015 study at [facility] found that daily weigh-ins helped people lose more weight, keep it off, and maintain that loss, even after two years. Use it to lose it. Step on the scale at  the same time every day for the best results. If your weight shoots up several pounds from one weigh-in to the next, don't freak out. Eating a lot of salt the night before or having your period is the likely culprit. The number should return to normal in a day or two. It's a steady climb that you need to do something about. 9. Too Much Stress and Too Little Sleep Are Your Enemies When you're tired and frazzled, your body cranks up the production of cortisol, the stress hormone that can cause carb cravings. Not getting enough sleep also boosts your levels of ghrelin, a hormone associated with hunger, while suppressing leptin, a hormone that signals fullness and satiety. People on a diet who slept only five and a half hours a night for two weeks lost 55 percent less fat and were hungrier than those who slept eight and a half hours, according to a study in the [place]. Use  it to lose it. Prioritize sleep, aiming for seven hours or more a night, which research shows helps lower stress. And make sure you're getting quality zzz's. If a snoring spouse or a fidgety cat wakes you up frequently throughout the night, you may end up getting the equivalent of just four hours of sleep, according to a study from [employer] [facility]. Keep pets out of the bedroom, and use a white-noise app to drown out snoring. 10. You Will Hit a plateau-And You Can Bust Through It As you slim down, your body releases much less leptin, the fullness hormone.  If you're not strength training, start right now. Building muscle can raise your metabolism to help you overcome a plateau. To keep your body challenged and burning calories, incorporate new moves and more intense intervals into your workouts or add another sweat session to your weekly routine. Alternatively, cut an extra 100 calories or so a day from your diet. Now that you've lost weight, your body simply doesn't need as much fuel.    Since food equals calories, in order to lose weight you must either eat fewer calories, exercise more to burn off calories with activity, or both. Food that is not used to fuel the body is stored as fat. A major component of losing weight is to make smarter food choices. Here's how:  1)   Limit non-nutritious foods, such as: Sugar, honey, syrups and candy Pastries, donuts, pies, cakes and cookies Soft drinks, sweetened juices and alcoholic beverages  2)  Cut down on high-fat foods by: - Choosing poultry, fish or lean red meat - Choosing low-fat cooking methods, such as baking, broiling, steaming, grilling and boiling - Using low-fat or non-fat dairy products - Using vinaigrette, herbs, lemon or fat-free salad dressings - Avoiding fatty meats, such as bacon, sausage, franks, ribs and luncheon meats - Avoiding high-fat snacks like nuts, chips and chocolate - Avoiding fried foods - Using less butter,  margarine, oil and mayonnaise - Avoiding high-fat gravies, cream sauces and cream-based soups  3) Eat a variety of foods, including: - Fruit and vegetables that are raw, steamed or baked - Whole grains, breads, cereal, rice and pasta - Dairy products, such as low-fat or non-fat milk or yogurt, low-fat cottage cheese and low-fat cheese - Protein-rich foods like chicken, [place], fish, lean meat and legumes, or beans  4) Change your eating habits by: - Eat three balanced meals a day to help control your hunger - Watch portion sizes and eat small servings of a variety  of foods - Choose low-calorie snacks - Eat only when you are hungry and stop when you are satisfied - Eat slowly and try not to perform other tasks while eating - Find other activities to distract you from food, such as walking, taking up a hobby or being involved in the community - Include regular exercise in your daily routine ( minimum of 20 min of moderate-intensity exercise at least 5 days/week)  - Find a support group, if necessary, for emotional support in your weight loss journey           Easy ways to cut 100 calories   1. Eat your eggs with hot sauce OR salsa instead of cheese.  Eggs are great for breakfast, but many people consider eggs and cheese to be BFFs. Instead of cheese-1 oz. of cheddar has 114 calories-top your eggs with hot sauce, which contains no calories and helps with satiety and metabolism. Salsa is also a great option!!  2. Top your toast, waffles or pancakes with fresh berries instead of jelly or syrup. Half a cup of berries-fresh, frozen or thawed-has about 40 calories, compared with 2 tbsp. of maple syrup or jelly, which both have about 100 calories. The berries will also give you a good punch of fiber, which helps keep you full and satisfied and won't spike blood sugar quickly like the jelly or syrup. 3. Swap the non-fat latte for black coffee with a splash of half-and-half. Contrary to its  name, that non-fat latte has 130 calories and a startling 19g of carbohydrates per 16 oz. serving. Replacing that 'light' drinkable dessert with a black coffee with a splash of half-and-half saves you more than 100 calories per 16 oz. serving. 4. Sprinkle salads with freeze-dried raspberries instead of dried cranberries. If you want a sweet addition to your nutritious salad, stay away from dried cranberries. They have a whopping 130 calories per  cup and 30g carbohydrates. Instead, sprinkle freeze-dried raspberries guilt-free and save more than 100 calories per  cup serving, adding 3g of belly-filling fiber. 5. Go for mustard in place of mayo on your sandwich. Mustard can add really nice flavor to any sandwich, and there are tons of varieties, from spicy to honey. A serving of mayo is 95 calories, versus 10 calories in a serving of mustard.  Or try an avocado mayo spread: You can find the recipe few click this link: https://www.californiaavocado.com/recipes/recipe-container/[place]-avocado-mayo 6. Choose a DIY salad dressing instead of the store-bought kind. Mix Dijon or whole grain mustard with low-fat Kefir or red wine vinegar and garlic. 7. Use hummus as a spread instead of a dip. Use hummus as a spread on a high-fiber cracker or tortilla with a sandwich and save on calories without sacrificing taste. 8. Pick just one salad "accessory." Salad isn't automatically a calorie winner. It's easy to over-accessorize with toppings. Instead of topping your salad with nuts, avocado and cranberries (all three will clock in at 313 calories), just pick one. The next day, choose a different accessory, which will also keep your salad interesting. You don't wear all your jewelry every day, right? 9. Ditch the white pasta in favor of spaghetti squash. One cup of cooked spaghetti squash has about 40 calories, compared with traditional spaghetti, which comes with more than 200. Spaghetti squash is also  nutrient-dense. It's a good source of fiber and Vitamins A and C, and it can be eaten just like you would eat pasta-with a great tomato sauce and [place] meatballs or with pesto,  tofu and spinach, for example. 10. Dress up your chili, soups and stews with non-fat [place] yogurt instead of sour cream. Just a 'dollop' of sour cream can set you back 115 calories and a whopping 12g of fat-seven of which are of the artery-clogging variety. Added bonus: [place] yogurt is packed with muscle-building protein, calcium and B Vitamins. 11. Mash cauliflower instead of mashed potatoes. One cup of traditional mashed potatoes-in all their creamy goodness-has more than 200 calories, compared to mashed cauliflower, which you can typically eat for less than 100 calories per 1 cup serving. Cauliflower is a great source of the antioxidant indole-3-carbinol (I3C), which may help reduce the risk of some cancers, like breast cancer. 12. Ditch the ice cream sundae in favor of a [place] yogurt parfait. Instead of a cup of ice cream or fro-yo for dessert, try 1 cup of nonfat Greek yogurt topped with fresh berries and a sprinkle of cacao nibs. Both toppings are packed with antioxidants, which can help reduce cellular inflammation and oxidative damage. And the comparison is a no-brainer: One cup of ice cream has about 275 calories; one cup of frozen yogurt has about 230; and a cup of Greek yogurt has just 130, plus twice the protein, so you're less likely to return to the freezer for a second helping. 13. Put olive oil in a spray container instead of using it directly from the bottle. Each tablespoon of olive oil is 120 calories and 15g of fat. Use a mister instead of pouring it straight into the pan or onto a salad. This allows for portion control and will save you more than 100 calories. 14. When baking, substitute canned pumpkin for butter or oil. Canned pumpkin-not pumpkin pie mix-is loaded with Vitamin A, which is important for skin  and eye health, as well as immunity. And the comparisons are pretty crazy:  cup of canned pumpkin has about 40 calories, compared to butter or oil, which has more than 800 calories. Yes, 800 calories. Applesauce and mashed banana can also serve as good substitutions for butter or oil, usually in a 1:1 ratio. 15. Top casseroles with high-fiber cereal instead of breadcrumbs. Breadcrumbs are typically made with white bread, while breakfast cereals contain 5-9g of fiber per serving. Not only will you save more than 150 calories per  cup serving, the swap will also keep you more full and you'll get a metabolism boost from the added fiber. 16. Snack on pistachios instead of macadamia nuts. Believe it or not, you get the same amount of calories from 35 pistachios (100 calories) as you would from only five macadamia nuts. 17. Chow down on kale chips rather than potato chips. This is my favorite 'don't knock it 'till you try it' swap. Kale chips are so easy to make at home, and you can spice them up with a little grated parmesan or chili powder. Plus, they're a mere fraction of the calories of potato chips, but with the same crunch factor we crave so often. 18. Add seltzer and some fruit slices to your cocktail instead of soda or fruit juice. One cup of soda or fruit juice can pack on as much as 140 calories. Instead, use seltzer and fruit slices. The fruit provides valuable phytochemicals, such as flavonoids and anthocyanins, which help to combat cancer and stave off the aging process.        Diabetes Mellitus and Standards of Medical Care  Managing diabetes (diabetes mellitus) can be complicated. Your diabetes treatment may be  managed by a team of health care providers, including:  A diet and nutrition specialist (registered dietitian).  A nurse.  A certified diabetes educator (CDE).  A diabetes specialist (endocrinologist).  An eye doctor.  A primary care provider.  A dentist.  Your  health care providers follow a schedule in order to help you get the best quality of care. The following schedule is a general guideline for your diabetes management plan. Your health care providers may also give you more specific instructions.  HbA1c (hemoglobin A1c) test This test provides information about blood sugar (glucose) control over the previous 2-3 months. It is used to check whether your diabetes management plan needs to be adjusted.  If you are meeting your treatment goals, this test is done at least 2 times a year.  If you are not meeting treatment goals or if your treatment goals have changed, this test is done 4 times a year.  Blood pressure test  This test is done at every routine medical visit. For most people, the goal is less than 130/80. Ask your health care provider what your goal blood pressure should be.  Dental and eye exams  Visit your dentist two times a year.  If you have type 1 diabetes, get an eye exam 3-5 years after you are diagnosed, and then once a year after your first exam. ? If you were diagnosed with type 1 diabetes as a child, get an eye exam when you are age 81 or older and have had diabetes for 3-5 years. After the first exam, you should get an eye exam once a year.  If you have type 2 diabetes, have an eye exam as soon as you are diagnosed, and then once a year after your first exam.  Foot care exam  Visual foot exams are done at every routine medical visit. The exams check for cuts, bruises, redness, blisters, sores, or other problems with the feet.  A complete foot exam is done by your health care provider once a year. This exam includes an inspection of the structure and skin of your feet, and a check of the pulses and sensation in your feet. ? Type 1 diabetes: Get your first exam 3-5 years after diagnosis. ? Type 2 diabetes: Get your first exam as soon as you are diagnosed.  Check your feet every day for cuts, bruises, redness, blisters, or  sores. If you have any of these or other problems that are not healing, contact your health care provider.  Kidney function test (urine microalbumin)  This test is done once a year. ? Type 1 diabetes: Get your first test 5 years after diagnosis. ? Type 2 diabetes: Get your first test as soon as you are diagnosed._  If you have chronic kidney disease (CKD), get a serum creatinine and estimated glomerular filtration rate (eGFR) test once a year.  Lipid profile (cholesterol, HDL, LDL, triglycerides)  This test should be done when you are diagnosed with diabetes, and every 5 years after the first test. If you are on medicines to lower your cholesterol, you may need to get this test done every year. ? The goal for LDL is less than 100 mg/dL (5.5 mmol/L). If you are at high risk, the goal is less than 70 mg/dL (3.9 mmol/L). ? The goal for HDL is 40 mg/dL (2.2 mmol/L) for men and 50 mg/dL(2.8 mmol/L) for women. An HDL cholesterol of 60 mg/dL (3.3 mmol/L) or higher gives some protection against heart disease. ?  The goal for triglycerides is less than 150 mg/dL (8.3 mmol/L).  Immunizations  The yearly flu (influenza) vaccine is recommended for everyone 6 months or older who has diabetes.  The pneumonia (pneumococcal) vaccine is recommended for everyone 2 years or older who has diabetes. If you are 34 or older, you may get the pneumonia vaccine as a series of two separate shots.  The hepatitis B vaccine is recommended for adults shortly after they have been diagnosed with diabetes.  The Tdap (tetanus, diphtheria, and pertussis) vaccine should be given: ? According to normal childhood vaccination schedules, for children. ? Every 10 years, for adults who have diabetes.  The shingles vaccine is recommended for people who have had chicken pox and are 50 years or older.  Mental and emotional health  Screening for symptoms of eating disorders, anxiety, and depression is recommended at the time of  diagnosis and afterward as needed. If your screening shows that you have symptoms (you have a positive screening result), you may need further evaluation and be referred to a mental health care provider.  Diabetes self-management education  Education about how to manage your diabetes is recommended at diagnosis and ongoing as needed.  Treatment plan  Your treatment plan will be reviewed at every medical visit.  Summary  Managing diabetes (diabetes mellitus) can be complicated. Your diabetes treatment may be managed by a team of health care providers.  Your health care providers follow a schedule in order to help you get the best quality of care.  Standards of care including having regular physical exams, blood tests, blood pressure monitoring, immunizations, screening tests, and education about how to manage your diabetes.  Your health care providers may also give you more specific instructions based on your individual health.      Type 2 Diabetes Mellitus, Self Care, Adult Caring for yourself after you have been diagnosed with type 2 diabetes (type 2 diabetes mellitus) means keeping your blood sugar (glucose) under control with a balance of:  Nutrition.  Exercise.  Lifestyle changes.  Medicines or insulin, if necessary.  Support from your team of health care providers and others.  The following information explains what you need to know to manage your diabetes at home. What do I need to do to manage my blood glucose?  Check your blood glucose every day, as often as told by your health care provider.  Contact your health care provider if your blood glucose is above your target for 2 tests in a row.  Have your A1c (hemoglobin A1c) level checked at least two times a year, or as often as told by your health care provider. Your health care provider will set individualized treatment goals for you. Generally, the goal of treatment is to maintain the following blood glucose  levels:  Before meals (preprandial): 80-130 mg/dL (4.4-7.2 mmol/L).  After meals (postprandial): below 180 mg/dL (10 mmol/L).  A1c level: less than 7%.  What do I need to know about hyperglycemia and hypoglycemia? What is hyperglycemia? Hyperglycemia, also called high blood glucose, occurs when blood glucose is too high.Make sure you know the early signs of hyperglycemia, such as:  Increased thirst.  Hunger.  Feeling very tired.  Needing to urinate more often than usual.  Blurry vision.  What is hypoglycemia? Hypoglycemia, also called low blood glucose, occurswith a blood glucose level at or below 70 mg/dL (3.9 mmol/L). The risk for hypoglycemia increases during or after exercise, during sleep, during illness, and when skipping meals or not  eating for a long time (fasting). It is important to know the symptoms of hypoglycemia and treat it right away. Always have a 15-gram rapid-acting carbohydrate snack with you to treat low blood glucose. Family members and close friends should also know the symptoms and should understand how to treat hypoglycemia, in case you are not able to treat yourself. What are the symptoms of hypoglycemia? Hypoglycemia symptoms can include:  Hunger.  Anxiety.  Sweating and feeling clammy.  Confusion.  Dizziness or feeling light-headed.  Sleepiness.  Nausea.  Increased heart rate.  Headache.  Blurry vision.  Seizure.  Nightmares.  Tingling or numbness around the mouth, lips, or tongue.  A change in speech.  Decreased ability to concentrate.  A change in coordination.  Restless sleep.  Tremors or shakes.  Fainting.  Irritability.  How do I treat hypoglycemia?  If you are alert and able to swallow safely, follow the 15:15 rule:  Take 15 grams of a rapid-acting carbohydrate. Rapid-acting options include: ? 1 tube of glucose gel. ? 3 glucose pills. ? 6-8 pieces of hard candy. ? 4 oz (120 mL) of fruit juice. ? 4 oz (120  mL) of regular (not diet) soda.  Check your blood glucose 15 minutes after you take the carbohydrate.  If the repeat blood glucose level is still at or below 70 mg/dL (3.9 mmol/L), take 15 grams of a carbohydrate again.  If your blood glucose level does not increase above 70 mg/dL (3.9 mmol/L) after 3 tries, seek emergency medical care.  After your blood glucose level returns to normal, eat a meal or a snack within 1 hour.  How do I treat severe hypoglycemia? Severe hypoglycemia is when your blood glucose level is at or below 54 mg/dL (3 mmol/L). Severe hypoglycemia is an emergency. Do not wait to see if the symptoms will go away. Get medical help right away. Call your local emergency services (911 in the [place] Do not drive yourself to the hospital. If you have severe hypoglycemia and you cannot eat or drink, you may need an injection of glucagon. A family member or close friend should learn how to check your blood glucose and how to give you a glucagon injection. Ask your health care provider if you need to have an emergency glucagon injection kit available. Severe hypoglycemia may need to be treated in a hospital. The treatment may include getting glucose through an IV tube. You may also need treatment for the cause of your hypoglycemia. Can having diabetes put me at risk for other conditions? Having diabetes can put you at risk for other long-term (chronic) conditions, such as heart disease and kidney disease. Your health care provider may prescribe medicines to help prevent complications from diabetes. These medicines may include:  Aspirin.  Medicine to lower cholesterol.  Medicine to control blood pressure.  What else can I do to manage my diabetes? Take your diabetes medicines as told  If your health care provider prescribed insulin or diabetes medicines, take them every day.  Do not run out of insulin or other diabetes medicines that you take. Plan ahead so you always have these  available.  If you use insulin, adjust your dosage based on how physically active you are and what foods you eat. Your health care provider will tell you how to adjust your dosage. Make healthy food choices  The things that you eat and drink affect your blood glucose and your insulin dosage. Making good choices helps to control your diabetes and  prevent other health problems. A healthy meal plan includes eating lean proteins, complex carbohydrates, fresh fruits and vegetables, low-fat dairy products, and healthy fats. Make an appointment to see a diet and nutrition specialist (registered dietitian) to help you create an eating plan that is right for you. Make sure that you:  Follow instructions from your health care provider about eating or drinking restrictions.  Drink enough fluid to keep your urine clear or pale yellow.  Eat healthy snacks between nutritious meals.  Track the carbohydrates that you eat. Do this by reading food labels and learning the standard serving sizes of foods.  Follow your sick day plan whenever you cannot eat or drink as usual. Make this plan in advance with your health care provider.  Stay active  Exercise regularly, as told by your health care provider. This may include:  Stretching and doing strength exercises, such as yoga or weightlifting, at least 2 times a week.  Doing at least 150 minutes of moderate-intensity or vigorous-intensity exercise each week. This could be brisk walking, biking, or water aerobics. ? Spread out your activity over at least 3 days of the week. ? Do not go more than 2 days in a row without doing some kind of physical activity.  When you start a new exercise or activity, work with your health care provider to adjust your insulin, medicines, or food intake as needed. Make healthy lifestyle choices  Do not use any tobacco products, such as cigarettes, chewing tobacco, and e-cigarettes. If you need help quitting, ask your health care  provider.  If your health care provider says that alcohol is safe for you, limit alcohol intake to no more than 1 drink per day for nonpregnant women and 2 drinks per day for men. One drink equals 12 oz of beer, 5 oz of wine, or 1 oz of hard liquor.  Learn to manage stress. If you need help with this, ask your health care provider. Care for your body   Keep your immunizations up to date. In addition to getting vaccinations as told by your health care provider, it is recommended that you get vaccinated against the following illnesses: ? The flu (influenza). Get a flu shot every year. ? Pneumonia. ? Hepatitis B.  Schedule an eye exam soon after your diagnosis, and then one time every year after that.  Check your skin and feet every day for cuts, bruises, redness, blisters, or sores. Schedule a foot exam with your health care provider once every year.  Brush your teeth and gums two times a day, and floss at least one time a day. Visit your dentist at least once every 6 months.  Maintain a healthy weight. General instructions  Take over-the-counter and prescription medicines only as told by your health care provider.  Share your diabetes management plan with people in your workplace, school, and household.  Check your urine for ketones when you are ill and as told by your health care provider.  Ask your health care provider: ? Do I need to meet with a diabetes educator? ? Where can I find a support group for people with diabetes?  Carry a medical alert card or wear medical alert jewelry.  Keep all follow-up visits as told by your health care provider. This is important. Where to find more information: For more information about diabetes, visit:  American Diabetes Association (ADA): www.diabetes.org  American Association of Diabetes Educators (AADE): www.diabeteseducator.org/patient-resources  This information is not intended to replace advice given to  you by your health care  provider. Make sure you discuss any questions you have with your health care provider. Document Released: 03/14/2016 Document Revised: 04/28/2016 Document Reviewed: 12/25/2015 Elsevier Interactive Patient Education  2017 [place].      Blood Glucose Monitoring, Adult Monitoring your blood sugar (glucose) helps you manage your diabetes. It also helps you and your health care provider determine how well your diabetes management plan is working. Blood glucose monitoring involves checking your blood glucose as often as directed, and keeping a record (log) of your results over time. Why should I monitor my blood glucose? Checking your blood glucose regularly can:  Help you understand how food, exercise, illnesses, and medicines affect your blood glucose.  Let you know what your blood glucose is at any time. You can quickly tell if you are having low blood glucose (hypoglycemia) or high blood glucose (hyperglycemia).  Help you and your health care provider adjust your medicines as needed.  When should I check my blood glucose? Follow instructions from your health care provider about how often to check your blood glucose.   This may depend on:  The type of diabetes you have.  How well-controlled your diabetes is.  Medicines you are taking.  If you have type 1 diabetes:  Check your blood glucose at least 2 times a day.  Also check your blood glucose: ? Before every insulin injection. ? Before and after exercise. ? Between meals. ? 2 hours after a meal. ? Occasionally between 2:00 a.m. and 3:00 a.m., as directed. ? Before potentially dangerous tasks, like driving or using heavy machinery. ? At bedtime.  You may need to check your blood glucose more often, up to 6-10 times a day: ? If you use an insulin pump. ? If you need multiple daily injections (MDI). ? If your diabetes is not well-controlled. ? If you are ill. ? If you have a history of severe hypoglycemia. ? If you  have a history of not knowing when your blood glucose is getting low (hypoglycemia unawareness).  If you have type 2 diabetes:  If you take insulin or other diabetes medicines, check your blood glucose at least 2 times a day.  If you are on intensive insulin therapy, check your blood glucose at least 4 times a day. Occasionally, you may also need to check between 2:00 a.m. and 3:00 a.m., as directed.  Also check your blood glucose: ? Before and after exercise. ? Before potentially dangerous tasks, like driving or using heavy machinery.  You may need to check your blood glucose more often if: ? Your medicine is being adjusted. ? Your diabetes is not well-controlled. ? You are ill.  What is a blood glucose log?  A blood glucose log is a record of your blood glucose readings. It helps you and your health care provider: ? Look for patterns in your blood glucose over time. ? Adjust your diabetes management plan as needed.  Every time you check your blood glucose, write down your result and notes about things that may be affecting your blood glucose, such as your diet and exercise for the day.  Most glucose meters store a record of glucose readings in the meter. Some meters allow you to download your records to a computer. How do I check my blood glucose? Follow these steps to get accurate readings of your blood glucose: Supplies needed   Blood glucose meter.  Test strips for your meter. Each meter has its own strips. You  must use the strips that come with your meter.  A needle to prick your finger (lancet). Do not use lancets more than once.  A device that holds the lancet (lancing device).  A journal or log book to write down your results.  Procedure  Wash your hands with soap and water.  Prick the side of your finger (not the tip) with the lancet. Use a different finger each time.  Gently rub the finger until a small drop of blood appears.  Follow instructions that come  with your meter for inserting the test strip, applying blood to the strip, and using your blood glucose meter.  Write down your result and any notes.  Alternative testing sites  Some meters allow you to use areas of your body other than your finger (alternative sites) to test your blood.  If you think you may have hypoglycemia, or if you have hypoglycemia unawareness, do not use alternative sites. Use your finger instead.  Alternative sites may not be as accurate as the fingers, because blood flow is slower in these areas. This means that the result you get may be delayed, and it may be different from the result that you would get from your finger.  The most common alternative sites are: ? Forearm. ? Thigh. ? Palm of the hand.  Additional tips  Always keep your supplies with you.  If you have questions or need help, all blood glucose meters have a 24-hour "hotline" number that you can call. You may also contact your health care provider.  After you use a few boxes of test strips, adjust (calibrate) your blood glucose meter by following instructions that came with your meter.    The American Diabetes Association suggests the following targets for most nonpregnant adults with diabetes.  More or less stringent glycemic goals may be appropriate for each individual.  A1C: Less than 7% A1C may also be reported as eAG: Less than 154 mg/dl Before a meal (preprandial plasma glucose): 80-130 mg/dl 1-2 hours after beginning of the meal (Postprandial plasma glucose)*: Less than 180 mg/dl  *Postprandial glucose may be targeted if A1C goals are not met despite reaching preprandial glucose goals.   GOALS in short:  The goals are for the Hgb A1C to be less than 7.0 & blood pressure to be less than 130/80.    It is recommended that all diabetics are educated on and follow a healthy diabetic diet, exercise for 30 minutes 3-4 times per week (walking, biking, swimming, or machine), monitor blood  glucose readings and bring that record with you to be reviewed at your next office visit.     You should be checking fasting blood sugars- especially after you eat poorly or eat really healthy, and also check 2 hour postprandial blood sugars after largest meal of the day.    Write these down and bring in your log at each office visit.    You will need to be seen every 3 months by the provider managing your Diabetes unless told otherwise by that provider.   You will need yearly eye exams from an eye specialist and foot exams to check the nerves of your feet.  Also, your urine should be checked yearly as well to make sure excess protein is not present.   If you are checking your blood pressure at home, please record it and bring it to your next office visit.    Follow the Dietary Approaches to Stop Hypertension (DASH) diet (3 servings of  fruit and vegetables daily, whole grains, low sodium, low-fat proteins).  See below.    Lastly, when it comes to your cholesterol, the goal is to have the HDL (good cholesterol) >40, and the LDL (bad cholesterol) <100.   It is recommended that you follow a heart healthy, low saturated and trans-fat diet and exercise for 30 minutes at least 5 times a week.     (( Check out the DASH diet = 1.5 Gram Low Sodium Diet   A 1.5 gram sodium diet restricts the amount of sodium in the diet to no more than 1.5 g or 1500 mg daily.  The American Heart Association recommends Americans over the age of 53 to consume no more than 1500 mg of sodium each day to reduce the risk of developing high blood pressure.  Research also shows that limiting sodium may reduce heart attack and stroke risk.  Many foods contain sodium for flavor and sometimes as a preservative.  When the amount of sodium in a diet needs to be low, it is important to know what to look for when choosing foods and drinks.  The following includes some information and guidelines to help make it easier for you to adapt to a  low sodium diet.    QUICK TIPS  Do not add salt to food.  Avoid convenience items and fast food.  Choose unsalted snack foods.  Buy lower sodium products, often labeled as "lower sodium" or "no salt added."  Check food labels to learn how much sodium is in 1 serving.  When eating at a restaurant, ask that your food be prepared with less salt or none, if possible.    READING FOOD LABELS FOR SODIUM INFORMATION  The nutrition facts label is a good place to find how much sodium is in foods. Look for products with no more than 400 mg of sodium per serving.  Remember that 1.5 g = 1500 mg.  The food label may also list foods as:  Sodium-free: Less than 5 mg in a serving.  Very low sodium: 35 mg or less in a serving.  Low-sodium: 140 mg or less in a serving.  Light in sodium: 50% less sodium in a serving. For example, if a food that usually has 300 mg of sodium is changed to become light in sodium, it will have 150 mg of sodium.  Reduced sodium: 25% less sodium in a serving. For example, if a food that usually has 400 mg of sodium is changed to reduced sodium, it will have 300 mg of sodium.    CHOOSING FOODS  Grains  Avoid: Salted crackers and snack items. Some cereals, including instant hot cereals. Bread stuffing and biscuit mixes. Seasoned rice or pasta mixes.  Choose: Unsalted snack items. Low-sodium cereals, oats, puffed wheat and rice, shredded wheat. English muffins and bread. Pasta.  Meats  Avoid: Salted, canned, smoked, spiced, pickled meats, including fish and poultry. Bacon, ham, sausage, cold cuts, hot dogs, anchovies.  Choose: Low-sodium canned tuna and salmon. Fresh or frozen meat, poultry, and fish.  Dairy  Avoid: Processed cheese and spreads. Cottage cheese. Buttermilk and condensed milk. Regular cheese.  Choose: Milk. Low-sodium cottage cheese. Yogurt. Sour cream. Low-sodium cheese.  Fruits and Vegetables  Avoid: Regular canned vegetables. Regular canned tomato sauce and  paste. Frozen vegetables in sauces. Olives. Angie Fava. Relishes. Sauerkraut.  Choose: Low-sodium canned vegetables. Low-sodium tomato sauce and paste. Frozen or fresh vegetables. Fresh and frozen fruit.  Condiments  Avoid: Canned and packaged  gravies. Worcestershire sauce. Tartar sauce. Barbecue sauce. Soy sauce. Steak sauce. Ketchup. Onion, garlic, and table salt. Meat flavorings and tenderizers.  Choose: Fresh and dried herbs and spices. Low-sodium varieties of mustard and ketchup. Lemon juice. Tabasco sauce. Horseradish.    SAMPLE 1.5 GRAM SODIUM MEAL PLAN:   Breakfast / Sodium (mg)  1 cup low-fat milk / 143 mg  1 whole-wheat English muffin / 240 mg  1 tbs heart-healthy margarine / 153 mg  1 hard-boiled egg / 139 mg  1 small orange / 0 mg  Lunch / Sodium (mg)  1 cup raw carrots / 76 mg  2 tbs no salt added peanut butter / 5 mg  2 slices whole-wheat bread / 270 mg  1 tbs jelly / 6 mg   cup red grapes / 2 mg  Dinner / Sodium (mg)  1 cup whole-wheat pasta / 2 mg  1 cup low-sodium tomato sauce / 73 mg  3 oz lean ground beef / 57 mg  1 small side salad (1 cup raw spinach leaves,  cup cucumber,  cup yellow bell pepper) with 1 tsp olive oil and 1 tsp red wine vinegar / 25 mg  Snack / Sodium (mg)  1 container low-fat vanilla yogurt / 107 mg  3 graham cracker squares / 127 mg  Nutrient Analysis  Calories: 1745  Protein: 75 g  Carbohydrate: 237 g  Fat: 57 g  Sodium: 1425 mg  Document Released: 11/21/2005 Document Revised: 08/03/2011 Document Reviewed: 02/22/2010  ExitCare Patient Information 2012 [place].))    This information is not intended to replace advice given to you by your health care provider. Make sure you discuss any questions you have with your health care provider. Document Released: 11/24/2003 Document Revised: 06/10/2016 Document Reviewed: 05/02/2016 Elsevier Interactive Patient Education  2017 [employer].

## 2018-11-08 NOTE — Progress Notes (Signed)
Impression and Recommendations:    1. Type 2 diabetes mellitus with complication, without long-term current use of insulin (HCC)   2. Obesity (BMI 30-39.9)   3. Allergy history unknown- possible throat closure sensation to certain foods     Type 2 diabetes mellitus with complication, without long-term current use of insulin (HCC) - Plan: POCT glycosylated hemoglobin (Hb A1C)  Obesity (BMI 30-39.9)  Allergy history unknown- possible throat closure sensation to certain foods - Plan: Allergen Profile, Food-Grain   1. DM  -A1c at 6.9 as of 07/12/2018 -Repeat A1c at 7.0 today, 11/08/2018 -Fasting blood sugar from 80-90 and her typical blood sugar readings have ranged from 130-150.  -Advised that the patient should check a fasting and a 2 hour post-prandial after her largest meal of the day.  -Goal of under 130 and A1c goal of under 6.5.  -Patient has been tolerating Metformin with no issues at this time.  -American Heart Association guidelines for healthy diet, basically Mediterranean diet, and exercise guidelines of 30 minutes 5 days per week or more discussed in detail. -Health counseling performed.  All questions answered. -Continue on 2 tablets Metformin 500 mg BID.  -Discussed goal of weight under 200 lbs in 4 months to aid with DM and back pain.    2.  Obesity -Explained to patient what BMI refers to, and what it means medically.    Told patient to think about it as a "medical risk stratification measurement" and how increasing BMI is associated with increasing risk/ or worsening state of various diseases such as hypertension, hyperlipidemia, diabetes, premature OA, depression etc.  American Heart Association guidelines for healthy diet, basically Mediterranean diet, and exercise guidelines of 30 minutes 5 days per week or more discussed in detail.  Health counseling performed.  All questions answered.   3. Chronic back pain -Patient continues on Lyrica and notices mild  change.  -She has been evaluated at [facility] twice had an epidural steroid injection.  -She had physical therapy as well 2 times a week x 6 weeks and just finished yesterday.  -Advised the patient to continue with her exercises that she was instructed on via the physical therapist.    4. Allergic to certain foods -She notes that she possibly could be allergic to bread, due to a throat closing sensation, however, she would like an allergy test to be completed to confirm this.  -We will order an allergy lab panel for grains to be completed.   Follow up in 4 months.      Education and routine counseling performed. Handouts provided.   Orders Placed This Encounter  Procedures  . Allergen Profile, Food-Grain  . POCT glycosylated hemoglobin (Hb A1C)    Medications Discontinued During This Encounter  Medication Reason  . cyclobenzaprine (FLEXERIL) 10 MG tablet Completed Course  . ondansetron (ZOFRAN-ODT) 8 MG disintegrating tablet No longer needed (for PRN medications)      The patient was counseled, risk factors were discussed, anticipatory guidance given.  Gross side effects, risk and benefits, and alternatives of medications discussed with patient.  Patient is aware that all medications have potential side effects and we are unable to predict every side effect or drug-drug interaction that may occur.  Expresses verbal understanding and consents to current therapy plan and treatment regimen.   Return for 60mof/up wt loss, DM, BP etc- sooner if desires support w/ health goals.   Please see AVS handed out to patient at the  end of our visit for further patient instructions/ counseling done pertaining to today's office visit.    Note:  This document was prepared using Dragon voice recognition software and may include unintentional dictation errors.  This document serves as a record of services personally performed by [name], DO. It was created on her behalf by Steva Colder, a trained medical scribe. The creation of this record is based on the scribe's personal observations and the provider's statements to them.   I have reviewed the above medical documentation for accuracy and completeness and I concur.  [name], DO     Subjective:    Chief Complaint  Patient presents with  . Follow-up     Samantha [name] is a 37 y.o. female who presents to [place] at [facility] today for Diabetes Management.    Chronic Back Pain:  She was recently referred to a neurologist and started on Lyrica. She has been taking the medication and she notices a mild change. She was sent to [place] and she was sent twice and she had an epidural steroid injection and she noted that she had 4 hours of relief following. She was advised to follow up if she needed another injection. Her back pain returned 4 days after the injections. She had physical therapy as well 2 times a week x 6 weeks and just finished yesterday. The physical therapy was somewhat good and she was informed that her mobility increased.    Possible food allergy:  She notes that she possibly could be allergic to bread, due to a throat closing sensation, however, she would like an allergy test to be completed to confirm this.    DM HPI: -  She has been working on diet and exercise for diabetes. She recently started walking 1 mile and notes that she ran a mile as well.   Pt is currently maintained on the following medications for diabetes:   see med list today Medication compliance - Metformin 500 mg  Home glucose readings range: Fasting blood sugar from 80-90 and her typical blood sugar readings have ranged from 130-150.     Denies polyuria/polydipsia. Denies hypo/ hyperglycemia symptoms - She denies new onset of: chest pain, exercise intolerance, shortness of breath, dizziness, visual changes, headache, lower extremity swelling or claudication.   Last diabetic eye exam was No  results found for: HMDIABEYEEXA  Foot exam- UTD  Last A1C in the office was:  Lab Results  Component Value Date   HGBA1C 7.0 (A) 11/08/2018   HGBA1C 6.9 07/12/2018   HGBA1C 7.2 (H) 04/06/2018    Lab Results  Component Value Date   MICROALBUR 30 04/06/2018   LDLCALC 51 04/06/2018   CREATININE 0.81 04/06/2018      Last 3 blood pressure readings in our office are as follows: BP Readings from Last 3 Encounters:  11/08/18 132/84  10/18/18 138/64  09/04/18 125/85    BMI Readings from Last 3 Encounters:  11/08/18 31.74 kg/m  09/04/18 31.28 kg/m  07/31/18 31.96 kg/m     No problems updated.    Patient Care Team    Relationship Specialty Notifications Start End  [name], DO PCP - General Family Medicine  09/29/16   Pedro Earls, MD Attending Physician Family Medicine  09/29/16   Carol Ada, [place] Physician Gastroenterology  06/19/17   Marcial Pacas, MD Consulting Physician Neurology  08/24/17   Willia Craze, NP Nurse Practitioner Gastroenterology  04/06/18  Ob/Gyn, [facility]    07/24/18      Patient Active Problem List   Diagnosis Date Noted  . Type 2 diabetes mellitus with complication, without long-term current use of insulin ([place]) 12/29/2015    Priority: High  . Obesity (BMI 30-39.9) 02/17/2014    Priority: High  . Acute folliculitis- likely due to MRSA, they are draining 06/19/2017    Priority: Medium  . Personal history of MRSA (methicillin resistant Staphylococcus aureus) 06/19/2017    Priority: Medium  . Environmental and seasonal allergies 09/29/2016    Priority: Medium  . GERD (gastroesophageal reflux disease) 09/29/2016    Priority: Medium  . Bipolar 1 disorder - Anxiety 09/28/2016    Priority: Medium  . Fatty liver 09/05/2014    Priority: Medium  . Chronic diarrhea  06/19/2017    Priority: Low  . Contact dermatitis and eczema- due to tape\ Band-Aid 06/19/2017    Priority: Low  . Fibromyalgia 08/11/2016    Priority:  Low  . OSA on CPAP 12/08/2014    Priority: Low  . Obstructive sleep apnea syndrome, moderate 11/24/2014    Priority: Low  . S/P abdominal hysterectomy 08/15/2014    Priority: Low  . Allergy history unknown- possible throat closure sensation to certain foods 26/83/4196  . Neuropathic pain 09/04/2018  . Chronic radicular pain of lower back 09/04/2018  . Lumbar foraminal stenosis-L5-S1 09/04/2018  . Osteoarthritis of spine with radiculopathy, lumbar region 09/04/2018  . Obesity, Class I, BMI 30-34.9 09/04/2018  . Piriformis syndrome, left 07/12/2018  . Single episode of elevated blood pressure 07/12/2018  . Hypertriglyceridemia 04/11/2018  . Low level of high density lipoprotein (HDL) 04/11/2018  . Vitamin D insufficiency 04/11/2018  . Epigastric abdominal pain with N 04/06/2018  . History of vitamin D deficiency 04/06/2018  . Spondylosis without myelopathy or radiculopathy, lumbar region 10/03/2017  . Abdominal pain, LUQ 05/02/2017  . Abdominal pain, LLQ 05/02/2017  . Hx of diarrhea 05/02/2017  . Bladder dysfunction 04/30/2014  . Nephrolithiasis 03/04/2014  . Agenesis, corpus callosum ([place]) 02/17/2014  . GAD (generalized anxiety disorder) 02/17/2014  . History of blood transfusion 02/17/2014     Past Medical History:  Diagnosis Date  . Anemia   . Anxiety   . Depression    rx presc but not taken  . Diabetes mellitus without complication ([place])   . Fibromyalgia   . GERD (gastroesophageal reflux disease)    no meds  . Headache(784.0)    tension and with anxiety hx  . Hepatic steatosis   . History of kidney stones   . MVP (mitral valve prolapse)    echo 10  . Pneumonia    hx  . Seizures ([place])    non epileptic events per epilepsy monitoring unit  . Seizures ([place])    last seizure July 2015- does not convulse but has a sleep effect  . Sinus infection    recent on antibiotic  . Sleep apnea    has cpap does not use     Past Surgical History:  Procedure Laterality  Date  . ABDOMINAL HYSTERECTOMY N/A 08/15/2014   Procedure: HYSTERECTOMY ABDOMINAL WITH CYSTOSCOPY;  Surgeon: Sanjuana Kava, MD;  Location: [facility];  Service: Gynecology;  Laterality: N/A;  . CESAREAN SECTION N/A   . SHOULDER ARTHROSCOPY WITH SUBACROMIAL DECOMPRESSION AND OPEN ROTATOR C Left 11/27/2015   Procedure: LEFT SHOULDER ARTHROSCOPY WITH SUBACROMIAL DECOMPRESSION, MINI OPEN ROTATOR CUFF REPAIR;  Surgeon: Netta Cedars, MD;  Location: [facility];  Service: Orthopedics;  Laterality:  Left;  . TUBAL LIGATION       Family History  Problem Relation Age of Onset  . Diabetes Mother   . Hypertension Mother   . Healthy Father   . Breast cancer Maternal Grandmother   . Cancer Maternal Grandfather        brain, lung, liver  . Heart attack Paternal Grandfather      Social History   Substance and Sexual Activity  Drug Use No  ,  Social History   Substance and Sexual Activity  Alcohol Use No  ,  Social History   Tobacco Use  Smoking Status Never Smoker  Smokeless Tobacco Never Used  ,    Current Outpatient Medications on File Prior to Visit  Medication Sig Dispense Refill  . blood glucose meter kit and supplies Dispense based on patient and insurance preference. Use to check blood glucose levels fasting in the morning and 2 hours after largest meal daily.  (FOR ICD-10 E10.9, E11.9). 1 each 0  . ibuprofen (ADVIL,MOTRIN) 600 MG tablet Take 1 tablet (600 mg total) by mouth every 6 (six) hours as needed. 30 tablet 0  . metFORMIN (GLUCOPHAGE) 500 MG tablet Take 239m every 12 hours after food 360 tablet 0  . pregabalin (LYRICA) 75 MG capsule Take 1 capsule (75 mg total) by mouth 2 (two) times daily. 60 capsule 5  . rifaximin (XIFAXAN) 550 MG TABS tablet Take 1 tablet (550 mg total) by mouth 3 (three) times daily. 42 tablet 0   No current facility-administered medications on file prior to visit.      Allergies  Allergen Reactions  . Sulfa Antibiotics Anaphylaxis and Swelling  .  Duloxetine Hcl Other (See Comments)    Weight gain, feels "crazy"  . Penicillins Rash    Has patient had a PCN reaction causing immediate rash, facial/tongue/throat swelling, SOB or lightheadedness with hypotension: Yes Has patient had a PCN reaction causing severe rash involving mucus membranes or skin necrosis: No Has patient had a PCN reaction that required hospitalization No Has patient had a PCN reaction occurring within the last 10 years: No If all of the above answers are "NO", then may proceed with Cephalosporin use.      Review of Systems:   General:  Denies fever, chills Optho/Auditory:   Denies visual changes, blurred vision Respiratory:   Denies SOB, cough, wheeze, DIB  Cardiovascular:   Denies chest pain, palpitations, painful respirations Gastrointestinal:   Denies nausea, vomiting, diarrhea.  Endocrine:     Denies new hot or cold intolerance Musculoskeletal:  Denies joint swelling, gait issues, or new unexplained myalgias/ arthralgias Skin:  Denies rash, suspicious lesions  Neurological:    Denies dizziness, unexplained weakness, numbness  Psychiatric/Behavioral:   Denies mood changes    Objective:     Blood pressure 132/84, pulse 80, temperature 98.7 F (37.1 C), height _0  (1.778 m), weight 221 lb 3.2 oz (100.3 kg), last menstrual period 08/05/2014, SpO2 98 %.  Body mass index is 31.74 kg/m.  General: Well Developed, well nourished, and in no acute distress.  HEENT: Normocephalic, atraumatic, pupils equal round reactive to light, neck supple, No carotid bruits, no JVD Skin: Warm and dry, cap RF less 2 sec Cardiac: Regular rate and rhythm, S1, S2 WNL's, no murmurs rubs or gallops Respiratory: ECTA B/L, Not using accessory muscles, speaking in full sentences. NeuroM-Sk: Ambulates w/o assistance, moves ext * 4 w/o difficulty, sensation grossly intact.  Ext: scant edema b/l lower ext Psych: No HI/SI, judgement  and insight good, Euthymic mood. Full Affect.

## 2018-11-09 ENCOUNTER — Ambulatory Visit: Payer: PPO | Admitting: Physical Therapy

## 2018-11-12 LAB — ALLERGEN PROFILE, FOOD-GRAIN
Allergen Barley IgE: <0.1 kU/L
Allergen Corn, IgE: <0.1 kU/L
Allergen Oat IgE: <0.1 kU/L
Allergen Rice IgE: <0.1 kU/L
Rye IgE: <0.1 kU/L
Wheat IgE: <0.1 kU/L

## 2018-11-13 ENCOUNTER — Telehealth: Payer: Self-pay | Admitting: Family Medicine

## 2018-11-13 NOTE — Telephone Encounter (Signed)
Patient was seen on 11/08/2018.  I reviewed chart and did not see a change in the Lyrica.  Please review and advise. MPulliam, CMA/RT(R)

## 2018-11-13 NOTE — Telephone Encounter (Signed)
That was never discussed.  You can see in my HPI what was discussed.    - She is already on Lyrica and she needs a follow-up with her spine /  back doctor regarding treatment of her radicular neuropathy.  She has been for epidural steroid injections etc. with them and these medicines associated with this condition should be adjusted by the specialist that are treating her

## 2018-11-13 NOTE — Telephone Encounter (Signed)
Patient's spouse called on behalf of patient. He stated that they thought there was going to be some changes to patient's Lyrica prescription after the last OV last week but when they contact [facility] they told him that they have no received anything about this. Spouse would like to have this reviewed and contacted back either way.  If approved please send to order to [facility] and Will (spouse) can be reached at [phone]

## 2018-11-14 ENCOUNTER — Other Ambulatory Visit: Payer: Self-pay

## 2018-11-14 DIAGNOSIS — Z789 Other specified health status: Secondary | ICD-10-CM

## 2018-11-14 NOTE — Telephone Encounter (Signed)
Called patient and notified.  Patient states that she will follow up with the doctor treat her back pain. MPulliam, CMA/RT(R)

## 2018-11-14 NOTE — Progress Notes (Signed)
Per Dr. Raliegh Scarlet referral placed for allergy. MPulliam, CMA/RT(R)

## 2019-01-01 ENCOUNTER — Encounter: Payer: Self-pay | Admitting: Adult Health

## 2019-01-01 ENCOUNTER — Ambulatory Visit (INDEPENDENT_AMBULATORY_CARE_PROVIDER_SITE_OTHER): Payer: PPO | Admitting: Adult Health

## 2019-01-01 VITALS — BP 114/74 | HR 89 | Temp 98.0°F | Ht 70.0 in | Wt 225.1 lb

## 2019-01-01 DIAGNOSIS — M79645 Pain in left finger(s): Secondary | ICD-10-CM | POA: Diagnosis not present

## 2019-01-01 NOTE — Assessment & Plan Note (Signed)
No acute injury/trauma No foreign body appreciated No s/s of infection Warm compress to Left 3rd finger several times/day. Alternate OTC Acetaminophen and Ibuprofen as needed for swelling/pain. Your Tetanus Vaccination is Up To Date- GREAT! If symptoms worsen/persist for another week, please call clinic. Please continue all other medications as directed.

## 2019-01-01 NOTE — Patient Instructions (Signed)
Heat Therapy Heat therapy is the use of heat to help ease sore, stiff, injured, and tight muscles and joints. Heat relaxes muscles, which may help relieve pain and muscle spasms. What are the risks? If you have any of the following conditions, do not use heat therapy unless your health care provider approves:  New bruises.  Open wounds.  Healing wounds, infected skin, or scarred skin in the area being treated.  Poor circulation.  Numbness in the area being treated.  Unusual swelling of the area being treated.  Blood clots.  Diabetes or heart disease.  Cancer.  Inability to communicate pain. This may include young children and people who have problems with their brain function (dementia). How to use heat therapy Use the heat source that your health care provider recommends, such as:  Moist heat pack.  Hot water bottle.  Electric heating pad.  Heated gel pack.  Heated wrap.  Warm water bath. Follow your health care provider's instructions about when and how to use heat therapy. In general, you should: 1. Place a towel between your skin and the heat source. 2. Leave the heat on for 20-30 minutes. Your skin may turn pink. 3. Remove the heat if your skin turns bright red. This is especially important if you are unable to feel pain, heat, or cold. You may have a greater risk of getting burned. If directed, you can also soak in a warm water bath. To prepare: 1. Put a non-slip padding in the bathtub to prevent slips or falls. 2. Fill a bathtub with warm water. 3. Always check the water temperature before getting into the bathtub. 4. Soak in the water for 15-20 minutes, or for as long as you are told by your health care provider. 5. When you are done, be careful when you stand up. You may feel dizzy. 6. After the bath, pat yourself dry. Do not rub your skin to dry it. General recommendations  Be careful to avoid burning your skin. High heat or long exposure to heat can cause  burns.  Check your skin during heat therapy.  Do not use heat therapy: ? For new injuries, especially if you have swelling on the injured area. ? On areas of skin that are already irritated, such as with a rash or sunburn. ? While sleeping. Only use heat therapy while you are awake. ? If your skin turns bright red. Contact a health care provider if you have:  Blisters, redness, swelling, or numbness in the area where you use heat therapy.  New pain.  Pain that gets worse. Summary  Heat therapy is the use of heat to help ease sore, stiff, injured, and tight muscles and joints. Heat relaxes muscles, which may help relieve pain.  If you have the following conditions, do not use heat therapy unless your health care provider approves: poor circulation, healing wounds, diabetes, numbness or swelling in the area being treated, blood clots, cancer, or the inability to communicate pain.  Be careful to avoid burning your skin. Only use heat therapy while you are awake.  Follow your health care provider's instructions about when and how to use heat therapy. This information is not intended to replace advice given to you by your health care provider. Make sure you discuss any questions you have with your health care provider. Document Released: 02/13/2012 Document Revised: 12/02/2017 Document Reviewed: 12/02/2017 Elsevier Interactive Patient Education  2019 [place].  Warm compress to Left 3rd finger several times/day. Alternate OTC Acetaminophen and  Ibuprofen as needed for swelling/pain. Your Tetanus Vaccination is Up To Date- GREAT! If symptoms worsen/persist for another week, please call clinic. Please continue all other medications as directed.  FEEL BETTER!

## 2019-01-01 NOTE — Progress Notes (Signed)
Subjective:    Patient ID: Samantha [name], female    DOB: 02-14-1981, 38 y.o.   MRN: [id number]  HPI:  Ms. Duignan presents with pain/sweeling at L 3rd PIP that started two days ago. She denies pain at rest, reports pain with use and palpation- 8/10, described as "tightness" She denies acute injury/trauma prior to onset of sx's She is unsure when last Tetanus vaccination was given She denies numbness/tingling in fingertip She has hx congential hand ligament malformation- camptodactyly? She reports using one dose of OTC Acetaminophen and Ibuprofen yesterday with only minimal sx relief. She reports "popping the third row of car seats up"over weekend and maybe "jammed my finger then"- ? She denies recent camping or tick bites  Patient Care Team    Relationship Specialty Notifications Start End  [name], DO PCP - General Family Medicine  09/29/16   Pedro Earls, MD Attending Physician Family Medicine  09/29/16   Carol Ada, [place] Physician Gastroenterology  06/19/17   Marcial Pacas, MD Consulting Physician Neurology  08/24/17   Willia Craze, NP Nurse Practitioner Gastroenterology  04/06/18   Ob/Gyn, [facility]    07/24/18     Patient Active Problem List   Diagnosis Date Noted  . Finger pain, left 01/01/2019  . Allergy history unknown- possible throat closure sensation to certain foods 62/37/6283  . Neuropathic pain 09/04/2018  . Chronic radicular pain of lower back 09/04/2018  . Lumbar foraminal stenosis-L5-S1 09/04/2018  . Osteoarthritis of spine with radiculopathy, lumbar region 09/04/2018  . Obesity, Class I, BMI 30-34.9 09/04/2018  . Piriformis syndrome, left 07/12/2018  . Single episode of elevated blood pressure 07/12/2018  . Hypertriglyceridemia 04/11/2018  . Low level of high density lipoprotein (HDL) 04/11/2018  . Vitamin D insufficiency 04/11/2018  . Epigastric abdominal pain with N 04/06/2018  . History of vitamin D deficiency 04/06/2018  .  Spondylosis without myelopathy or radiculopathy, lumbar region 10/03/2017  . Acute folliculitis- likely due to MRSA, they are draining 06/19/2017  . Personal history of MRSA (methicillin resistant Staphylococcus aureus) 06/19/2017  . Chronic diarrhea  06/19/2017  . Contact dermatitis and eczema- due to tape\ Band-Aid 06/19/2017  . Abdominal pain, LUQ 05/02/2017  . Abdominal pain, LLQ 05/02/2017  . Hx of diarrhea 05/02/2017  . Environmental and seasonal allergies 09/29/2016  . GERD (gastroesophageal reflux disease) 09/29/2016  . Bipolar 1 disorder - Anxiety 09/28/2016  . Fibromyalgia 08/11/2016  . Type 2 diabetes mellitus with complication, without long-term current use of insulin ([place]) 12/29/2015  . OSA on CPAP 12/08/2014  . Obstructive sleep apnea syndrome, moderate 11/24/2014  . Fatty liver 09/05/2014  . S/P abdominal hysterectomy 08/15/2014  . Bladder dysfunction 04/30/2014  . Nephrolithiasis 03/04/2014  . Agenesis, corpus callosum ([place]) 02/17/2014  . GAD (generalized anxiety disorder) 02/17/2014  . History of blood transfusion 02/17/2014  . Obesity (BMI 30-39.9) 02/17/2014     Past Medical History:  Diagnosis Date  . Anemia   . Anxiety   . Depression    rx presc but not taken  . Diabetes mellitus without complication ([place])   . Fibromyalgia   . GERD (gastroesophageal reflux disease)    no meds  . Headache(784.0)    tension and with anxiety hx  . Hepatic steatosis   . History of kidney stones   . MVP (mitral valve prolapse)    echo 10  . Pneumonia    hx  . Seizures ([place])    non epileptic events per epilepsy monitoring  unit  . Seizures ([place])    last seizure July 2015- does not convulse but has a sleep effect  . Sinus infection    recent on antibiotic  . Sleep apnea    has cpap does not use     Past Surgical History:  Procedure Laterality Date  . ABDOMINAL HYSTERECTOMY N/A 08/15/2014   Procedure: HYSTERECTOMY ABDOMINAL WITH CYSTOSCOPY;  Surgeon: Sanjuana Kava,  MD;  Location: [facility];  Service: Gynecology;  Laterality: N/A;  . CESAREAN SECTION N/A   . SHOULDER ARTHROSCOPY WITH SUBACROMIAL DECOMPRESSION AND OPEN ROTATOR C Left 11/27/2015   Procedure: LEFT SHOULDER ARTHROSCOPY WITH SUBACROMIAL DECOMPRESSION, MINI OPEN ROTATOR CUFF REPAIR;  Surgeon: Netta Cedars, MD;  Location: [facility];  Service: Orthopedics;  Laterality: Left;  . TUBAL LIGATION       Family History  Problem Relation Age of Onset  . Diabetes Mother   . Hypertension Mother   . Healthy Father   . Breast cancer Maternal Grandmother   . Cancer Maternal Grandfather        brain, lung, liver  . Heart attack Paternal Grandfather      Social History   Substance and Sexual Activity  Drug Use No     Social History   Substance and Sexual Activity  Alcohol Use No     Social History   Tobacco Use  Smoking Status Never Smoker  Smokeless Tobacco Never Used     Outpatient Encounter Medications as of 01/01/2019  Medication Sig Note  . blood glucose meter kit and supplies Dispense based on patient and insurance preference. Use to check blood glucose levels fasting in the morning and 2 hours after largest meal daily.  (FOR ICD-10 E10.9, E11.9).   Marland Kitchen ibuprofen (ADVIL,MOTRIN) 600 MG tablet Take 1 tablet (600 mg total) by mouth every 6 (six) hours as needed. 02/27/2018: Patient does not take everyday   . metFORMIN (GLUCOPHAGE) 500 MG tablet Take '250mg'$  every 12 hours after food   . pregabalin (LYRICA) 75 MG capsule Take 1 capsule (75 mg total) by mouth 2 (two) times daily.   . [DISCONTINUED] rifaximin (XIFAXAN) 550 MG TABS tablet Take 1 tablet (550 mg total) by mouth 3 (three) times daily.    No facility-administered encounter medications on file as of 01/01/2019.     Allergies: Sulfa antibiotics; Duloxetine hcl; and Penicillins  Body mass index is 32.3 kg/m.  Blood pressure 114/74, pulse 89, temperature 98 F (36.7 C), temperature source Oral, height '5\' 10"'$  (1.778 m), weight 225  lb 1.6 oz (102.1 kg), last menstrual period 08/05/2014, SpO2 98 %.  Review of Systems  Constitutional: Positive for fatigue. Negative for activity change, appetite change, chills, diaphoresis, fever and unexpected weight change.  Respiratory: Negative for cough, chest tightness, shortness of breath, wheezing and stridor.   Cardiovascular: Negative for chest pain, palpitations and leg swelling.  Musculoskeletal: Positive for arthralgias, joint swelling and myalgias. Negative for gait problem.  Skin: Positive for color change. Negative for pallor, rash and wound.  Hematological: Does not bruise/bleed easily.       Objective:   Physical Exam Constitutional:      General: She is not in acute distress.    Appearance: She is obese. She is not ill-appearing, toxic-appearing or diaphoretic.  HENT:     Head: Normocephalic and atraumatic.  Musculoskeletal: Normal range of motion.        General: Swelling and tenderness present. No signs of injury.     Left wrist: Normal.  Left hand: She exhibits tenderness, bony tenderness and swelling. She exhibits normal range of motion, normal two-point discrimination, normal capillary refill and no deformity. Normal sensation noted. Normal strength noted.       Hands:     Comments: L 3rd Finger- Minor erythema and edema over PIP, with pinpoint center of darkest redness noted  No open tissue/drainage noted No foreign body appreciated Bilaterally: 3/4/5 fingers- Contracted fingers since birth Equal, strong grip strength   Skin:    Capillary Refill: Capillary refill takes less than 2 seconds.  Neurological:     Mental Status: She is alert and oriented to person, place, and time.  Psychiatric:        Mood and Affect: Mood normal.        Behavior: Behavior normal.        Thought Content: Thought content normal.        Judgment: Judgment normal.       Assessment & Plan:   1. Finger pain, left     Finger pain, left No acute injury/trauma No  foreign body appreciated No s/s of infection Warm compress to Left 3rd finger several times/day. Alternate OTC Acetaminophen and Ibuprofen as needed for swelling/pain. Your Tetanus Vaccination is Up To Date- GREAT! If symptoms worsen/persist for another week, please call clinic. Please continue all other medications as directed.     FOLLOW-UP:  Return if symptoms worsen or fail to improve.

## 2019-01-03 ENCOUNTER — Telehealth: Payer: Self-pay

## 2019-01-03 NOTE — Telephone Encounter (Signed)
Pt calls stating that her pointer finger now has a dry, scaly appearance and middle finger is more swollen and feels hot to touch.  Per Mina Marble, NP pt informed to follow up with Dr. Raliegh Scarlet on 01/04/2019.  Pt expressed understanding and was transferred to front desk to schedule appt.  Charyl Bigger, CMA

## 2019-01-04 ENCOUNTER — Ambulatory Visit
Admission: EM | Admit: 2019-01-04 | Discharge: 2019-01-04 | Disposition: A | Discharge status: Home / Self Care | Payer: PPO | Attending: Family Medicine | Admitting: Family Medicine

## 2019-01-04 ENCOUNTER — Ambulatory Visit: Payer: PPO | Admitting: Family Medicine

## 2019-01-04 DIAGNOSIS — M79645 Pain in left finger(s): Secondary | ICD-10-CM

## 2019-01-04 DIAGNOSIS — L03012 Cellulitis of left finger: Secondary | ICD-10-CM | POA: Diagnosis not present

## 2019-01-04 MED ORDER — NAPROXEN 375 MG PO TABS: 375.0000 mg | ORAL_TABLET | Freq: Two times a day (BID) | ORAL | 0 refills | Status: DC

## 2019-01-04 MED ORDER — DOXYCYCLINE HYCLATE 100 MG PO CAPS: 100.0000 mg | ORAL_CAPSULE | Freq: Two times a day (BID) | ORAL | 0 refills | Status: DC

## 2019-01-04 NOTE — Discharge Instructions (Signed)
Prescribed doxycycline take as directed and to completion Naproxen prescribed.  Take as needed for pain and inflammation.  Avoid taking this medication with outer NSAIDs as this may cause GI upset Return or go to the ED if you do not have any improvement with medication over the next 24-48 hours, or if you have significant increased swelling, redness, decreased sensation, circulation in finger, fever, vomiting, etc..

## 2019-01-04 NOTE — ED Triage Notes (Signed)
Pt c/o lt middle finger swelling, redness, heat, tender to touch since Sunday. Saw pcp on Monday and now its worse.

## 2019-01-04 NOTE — ED Provider Notes (Signed)
[facility]   [id number] 01/04/19 Arrival Time: 6754  CC: Left middle finger pain  SUBJECTIVE: History from: patient. Samantha [name] is a 38 y.o. female complains of worsening left middle finger redness, swelling and pain that began 5 days ago.  Denies a precipitating event or specific injury. Saw PCP on Monday and was not treated with medication.  Localizes the pain to the left middle finger.  Describes the pain as intermittent and achy/ sharp in character.  Has tried OTC medications without relief.  Symptoms are made worse with ROM about the finger.  Denies similar symptoms in the past.  Complains of associated redness and swelling.  Denies fever, chills, ecchymosis, weakness, numbness and tingling.      ROS: As per HPI.  Past Medical History:  Diagnosis Date  . Anemia   . Anxiety   . Depression    rx presc but not taken  . Diabetes mellitus without complication ([place])   . Fibromyalgia   . GERD (gastroesophageal reflux disease)    no meds  . Headache(784.0)    tension and with anxiety hx  . Hepatic steatosis   . History of kidney stones   . MVP (mitral valve prolapse)    echo 10  . Pneumonia    hx  . Seizures ([place])    non epileptic events per epilepsy monitoring unit  . Seizures ([place])    last seizure July 2015- does not convulse but has a sleep effect  . Sinus infection    recent on antibiotic  . Sleep apnea    has cpap does not use   Past Surgical History:  Procedure Laterality Date  . ABDOMINAL HYSTERECTOMY N/A 08/15/2014   Procedure: HYSTERECTOMY ABDOMINAL WITH CYSTOSCOPY;  Surgeon: Sanjuana Kava, MD;  Location: [facility];  Service: Gynecology;  Laterality: N/A;  . CESAREAN SECTION N/A   . SHOULDER ARTHROSCOPY WITH SUBACROMIAL DECOMPRESSION AND OPEN ROTATOR C Left 11/27/2015   Procedure: LEFT SHOULDER ARTHROSCOPY WITH SUBACROMIAL DECOMPRESSION, MINI OPEN ROTATOR CUFF REPAIR;  Surgeon: Netta Cedars, MD;  Location: [facility];  Service: Orthopedics;  Laterality: Left;  .  TUBAL LIGATION     Allergies  Allergen Reactions  . Sulfa Antibiotics Anaphylaxis and Swelling  . Duloxetine Hcl Other (See Comments)    Weight gain, feels "crazy"  . Penicillins Rash    Has patient had a PCN reaction causing immediate rash, facial/tongue/throat swelling, SOB or lightheadedness with hypotension: Yes Has patient had a PCN reaction causing severe rash involving mucus membranes or skin necrosis: No Has patient had a PCN reaction that required hospitalization No Has patient had a PCN reaction occurring within the last 10 years: No If all of the above answers are "NO", then may proceed with Cephalosporin use.    No current facility-administered medications on file prior to encounter.    Current Outpatient Medications on File Prior to Encounter  Medication Sig Dispense Refill  . blood glucose meter kit and supplies Dispense based on patient and insurance preference. Use to check blood glucose levels fasting in the morning and 2 hours after largest meal daily.  (FOR ICD-10 E10.9, E11.9). 1 each 0  . metFORMIN (GLUCOPHAGE) 500 MG tablet Take 213m every 12 hours after food 360 tablet 0  . pregabalin (LYRICA) 75 MG capsule Take 1 capsule (75 mg total) by mouth 2 (two) times daily. 60 capsule 5   Social History   Socioeconomic History  . Marital status: Married    Spouse name: Not on file  .  Number of children: Not on file  . Years of education: Not on file  . Highest education level: Not on file  Occupational History  . Not on file  Social Needs  . Financial resource strain: Not on file  . Food insecurity:    Worry: Not on file    Inability: Not on file  . Transportation needs:    Medical: Not on file    Non-medical: Not on file  Tobacco Use  . Smoking status: Never Smoker  . Smokeless tobacco: Never Used  Substance and Sexual Activity  . Alcohol use: No  . Drug use: No  . Sexual activity: Yes    Birth control/protection: Surgical  Lifestyle  . Physical  activity:    Days per week: Not on file    Minutes per session: Not on file  . Stress: Not on file  Relationships  . Social connections:    Talks on phone: Not on file    Gets together: Not on file    Attends religious service: Not on file    Active member of club or organization: Not on file    Attends meetings of clubs or organizations: Not on file    Relationship status: Not on file  . Intimate partner violence:    Fear of current or ex partner: Not on file    Emotionally abused: Not on file    Physically abused: Not on file    Forced sexual activity: Not on file  Other Topics Concern  . Not on file  Social History Narrative  . Not on file   Family History  Problem Relation Age of Onset  . Diabetes Mother   . Hypertension Mother   . Healthy Father   . Breast cancer Maternal Grandmother   . Cancer Maternal Grandfather        brain, lung, liver  . Heart attack Paternal Grandfather     OBJECTIVE:  Vitals:   01/04/19 1126  BP: (!) 140/98  Pulse: 93  Resp: 18  Temp: 97.7 F (36.5 C)  TempSrc: Oral  SpO2: 98%    General appearance: Alert; in no acute distress.  Head: NCAT Lungs: CTA bilaterally; normal respiratory effort Heart: RRR.  LT Radial pulses 2+.  Cap refill of 3rd finger <2 secs Skin: warm and dry; 3 cm angular area of erythema and swelling; TTP without obvious bleeding or discharge; no obvious open sores; LROM about the left third PIP joint (see picture below) Neurologic: Ambulates without difficulty; Sensation intact about the upper extremities Psychological: alert and cooperative; normal mood and affect      ASSESSMENT & PLAN:  1. Cellulitis of finger of left hand   2. Finger pain, left    Meds ordered this encounter  Medications  . doxycycline (VIBRAMYCIN) 100 MG capsule    Sig: Take 1 capsule (100 mg total) by mouth 2 (two) times daily.    Dispense:  20 capsule    Refill:  0    Order Specific Question:   Supervising Provider    Answer:    Raylene Everts [[id number]]  . naproxen (NAPROSYN) 375 MG tablet    Sig: Take 1 tablet (375 mg total) by mouth 2 (two) times daily with a meal.    Dispense:  20 tablet    Refill:  0    Order Specific Question:   Supervising Provider    Answer:   Raylene Everts [[id number]]   Prescribed doxycycline take as directed and to completion  Naproxen prescribed.  Take as needed for pain and inflammation.  Avoid taking this medication with outer NSAIDs as this may cause GI upset Return or go to the ED if you do not have any improvement with medication over the next 24-48 hours, or if you have significant increased swelling, redness, decreased sensation, circulation in finger, fever, vomiting, etc..   Reviewed expectations re: course of current medical issues. Questions answered. Outlined signs and symptoms indicating need for more acute intervention. Patient verbalized understanding. After Visit Summary given.    Lestine Box, PA-C 01/04/19 1212

## 2019-01-16 ENCOUNTER — Ambulatory Visit: Payer: PPO | Admitting: Allergy

## 2019-01-31 ENCOUNTER — Ambulatory Visit: Payer: PPO | Admitting: Allergy

## 2019-02-28 ENCOUNTER — Ambulatory Visit (INDEPENDENT_AMBULATORY_CARE_PROVIDER_SITE_OTHER): Payer: PPO | Admitting: Allergy

## 2019-02-28 ENCOUNTER — Ambulatory Visit: Payer: PPO | Admitting: Allergy

## 2019-02-28 ENCOUNTER — Other Ambulatory Visit: Payer: Self-pay

## 2019-02-28 ENCOUNTER — Encounter: Payer: Self-pay | Admitting: Allergy

## 2019-02-28 VITALS — BP 122/76 | HR 88 | Temp 97.8°F | Resp 18 | Ht 68.0 in | Wt 221.0 lb

## 2019-02-28 DIAGNOSIS — R09A2 Foreign body sensation, throat: Secondary | ICD-10-CM | POA: Insufficient documentation

## 2019-02-28 DIAGNOSIS — T781XXD Other adverse food reactions, not elsewhere classified, subsequent encounter: Secondary | ICD-10-CM

## 2019-02-28 DIAGNOSIS — R0989 Other specified symptoms and signs involving the circulatory and respiratory systems: Secondary | ICD-10-CM | POA: Insufficient documentation

## 2019-02-28 DIAGNOSIS — I6523 Occlusion and stenosis of bilateral carotid arteries: Secondary | ICD-10-CM | POA: Diagnosis not present

## 2019-02-28 NOTE — Patient Instructions (Addendum)
Follow up on Monday for skin testing.  Do not take any antihistamines until then

## 2019-02-28 NOTE — Progress Notes (Signed)
New Patient Note  RE: LEMMA [name] MRN: [id number] DOB: 1981-05-05 Date of Office Visit: 02/28/2019  Referring provider: [name], DO Primary care provider: [name], DO  Chief Complaint: Food Intolerance (developed shortness of breath, tongue and throat itching, tongue swelling. thought that onions were the cause. has consumed since then with no problems. lettice seems to do the same thing. unsure of real reason for reaction. )  History of Present Illness: I had the pleasure of seeing Samantha [name] for initial evaluation at the [facility] on 03/04/2019. She is a 38 y.o. female, who is referred here by [name], DO for the evaluation of food intolerance.  Patient noticed some tongue/throat pruritus, throat clearing, globus sensation and tongue tightness. This started happening for the past 6 months or so. This happened after she was eating certain foods. Symptoms usually lasts for up to 1 hour. She only took benadryl once for this. Last episode was a few days ago.   Initially she thought it was related to certain foods such as lettuce, onion and bread. However these symptoms occurred when not consuming these foods and she also had these foods since then with no issues.   Past work up includes: Dec 2019 immunocap which showed negative to wheat, rye, barley, oat, corn, rice.  Dietary History: patient has been eating other foods including limited milk, eggs, peanut, treenuts, sesame, shellfish, seafood, soy, wheat, meats, fruits and vegetables.  Some heartburn but never on daily medications. No recent EGD.   She was also concerned if the Topamax contributed to her symptoms and she stopped taking it but still having symptoms.   Assessment and Plan: Khamryn is a 38 y.o. female with: Adverse food reaction Patient concerned for food allergies as noting some tongue/throat pruritus, globus sensation, throat clearing for the past 6 months. No specific food  triggers noted. 2019 2019 immunocap was negative to wheat, rye, barley, oat, corn, rice. No recent EGD.  Placed skin testing today but data was not interpretable as patient put her clothes back on right after placing the test. Will ask patient to return for skin testing.  Will make additional recommendations based on results.   Return in about 4 days (around 03/04/2019) for Skin testing.  Other allergy screening: Asthma: no Rhino conjunctivitis: yes  Mild rhinitis symptoms during the spring and she takes OTC antihistamines with good benefit.   Medication allergy: yes  Penicillin - hives Sulfa - tongue swelling  Hymenoptera allergy: no Urticaria: no Eczema:no History of recurrent infections suggestive of immunodeficency: no  Diagnostics: Skin Testing: placed food allergy panel on patient but patient put back on clothes before it was read. Data not interpretable.    Past Medical History: Patient Active Problem List   Diagnosis Date Noted   Globus sensation 02/28/2019   Adverse food reaction 02/28/2019   Finger pain, left 01/01/2019   Allergy history unknown- possible throat closure sensation to certain foods 29/79/8921   Neuropathic pain 09/04/2018   Chronic radicular pain of lower back 09/04/2018   Lumbar foraminal stenosis-L5-S1 09/04/2018   Osteoarthritis of spine with radiculopathy, lumbar region 09/04/2018   Obesity, Class I, BMI 30-34.9 09/04/2018   Piriformis syndrome, left 07/12/2018   Single episode of elevated blood pressure 07/12/2018   Hypertriglyceridemia 04/11/2018   Low level of high density lipoprotein (HDL) 04/11/2018   Vitamin D insufficiency 04/11/2018   Epigastric abdominal pain with N 04/06/2018   History of vitamin D deficiency 04/06/2018  Spondylosis without myelopathy or radiculopathy, lumbar region 60/73/7106   Acute folliculitis- likely due to MRSA, they are draining 06/19/2017   Personal history of MRSA (methicillin resistant  Staphylococcus aureus) 06/19/2017   Chronic diarrhea  06/19/2017   Contact dermatitis and eczema- due to tape\ Band-Aid 06/19/2017   Abdominal pain, LUQ 05/02/2017   Abdominal pain, LLQ 05/02/2017   Hx of diarrhea 05/02/2017   Environmental and seasonal allergies 09/29/2016   GERD (gastroesophageal reflux disease) 09/29/2016   Bipolar 1 disorder - Anxiety 09/28/2016   Fibromyalgia 08/11/2016   Type 2 diabetes mellitus with complication, without long-term current use of insulin ([place]) 12/29/2015   OSA on CPAP 12/08/2014   Obstructive sleep apnea syndrome, moderate 11/24/2014   Fatty liver 09/05/2014   S/P abdominal hysterectomy 08/15/2014   Bladder dysfunction 04/30/2014   Nephrolithiasis 03/04/2014   Agenesis, corpus callosum ([place]) 02/17/2014   GAD (generalized anxiety disorder) 02/17/2014   History of blood transfusion 02/17/2014   Obesity (BMI 30-39.9) 02/17/2014   Past Medical History:  Diagnosis Date   Anemia    Angio-edema    Anxiety    Depression    rx presc but not taken   Diabetes mellitus without complication (HCC)    Fibromyalgia    GERD (gastroesophageal reflux disease)    no meds   Headache(784.0)    tension and with anxiety hx   Hepatic steatosis    History of kidney stones    MVP (mitral valve prolapse)    echo 10   Pneumonia    hx   Seizures ([place])    non epileptic events per epilepsy monitoring unit   Seizures ([place])    last seizure July 2015- does not convulse but has a sleep effect   Sinus infection    recent on antibiotic   Sleep apnea    has cpap does not use   Past Surgical History: Past Surgical History:  Procedure Laterality Date   ABDOMINAL HYSTERECTOMY N/A 08/15/2014   Procedure: HYSTERECTOMY ABDOMINAL WITH CYSTOSCOPY;  Surgeon: Sanjuana Kava, MD;  Location: [facility];  Service: Gynecology;  Laterality: N/A;   CESAREAN SECTION N/A    SHOULDER ARTHROSCOPY WITH SUBACROMIAL DECOMPRESSION AND OPEN ROTATOR C Left  11/27/2015   Procedure: LEFT SHOULDER ARTHROSCOPY WITH SUBACROMIAL DECOMPRESSION, MINI OPEN ROTATOR CUFF REPAIR;  Surgeon: Netta Cedars, MD;  Location: [facility];  Service: Orthopedics;  Laterality: Left;   TUBAL LIGATION     Medication List:  Current Outpatient Medications  Medication Sig Dispense Refill   blood glucose meter kit and supplies Dispense based on patient and insurance preference. Use to check blood glucose levels fasting in the morning and 2 hours after largest meal daily.  (FOR ICD-10 E10.9, E11.9). 1 each 0   metFORMIN (GLUCOPHAGE) 500 MG tablet Take '250mg'$  every 12 hours after food 360 tablet 0   pregabalin (LYRICA) 75 MG capsule Take 75 mg by mouth 2 (two) times daily.     valACYclovir (VALTREX) 500 MG tablet valacyclovir 500 mg tablet     famotidine (PEPCID) 20 MG tablet Take 20 mg by mouth 2 (two) times daily.     No current facility-administered medications for this visit.    Allergies: Allergies  Allergen Reactions   Sulfa Antibiotics Anaphylaxis and Swelling   Topiramate Swelling    Tongue   Duloxetine Hcl Other (See Comments)    Weight gain, feels "crazy"   Penicillins Rash    Has patient had a PCN reaction causing immediate rash, facial/tongue/throat swelling, SOB or  lightheadedness with hypotension: Yes Has patient had a PCN reaction causing severe rash involving mucus membranes or skin necrosis: No Has patient had a PCN reaction that required hospitalization No Has patient had a PCN reaction occurring within the last 10 years: No If all of the above answers are "NO", then may proceed with Cephalosporin use.    Social History: Social History   Socioeconomic History   Marital status: Married    Spouse name: Not on file   Number of children: Not on file   Years of education: Not on file   Highest education level: Not on file  Occupational History   Not on file  Social Needs   Financial resource strain: Not on file   Food insecurity:     Worry: Not on file    Inability: Not on file   Transportation needs:    Medical: Not on file    Non-medical: Not on file  Tobacco Use   Smoking status: Never Smoker   Smokeless tobacco: Never Used  Substance and Sexual Activity   Alcohol use: No   Drug use: No   Sexual activity: Yes    Birth control/protection: Surgical  Lifestyle   Physical activity:    Days per week: Not on file    Minutes per session: Not on file   Stress: Not on file  Relationships   Social connections:    Talks on phone: Not on file    Gets together: Not on file    Attends religious service: Not on file    Active member of club or organization: Not on file    Attends meetings of clubs or organizations: Not on file    Relationship status: Not on file  Other Topics Concern   Not on file  Social History Narrative   Not on file   Lives in a 61.40 year old house. Smoking: denies Occupation: stay at home  Environmental History: Water Damage/mildew in the house: no Carpet in the family room: yes Carpet in the bedroom: yes Heating: electric Cooling: central Pet: yes 1 dog and 1 cat  Family History: Family History  Problem Relation Age of Onset   Diabetes Mother    Hypertension Mother    Healthy Father    Breast cancer Maternal Grandmother    Cancer Maternal Grandfather        brain, lung, liver   Heart attack Paternal Grandfather    Problem                               Relation Asthma                                   Mother ?  Eczema                                No  Food allergy                          No  Allergic rhino conjunctivitis     Mother   Review of Systems  Constitutional: Negative for appetite change, chills, fever and unexpected weight change.  HENT: Negative for congestion and rhinorrhea.   Eyes: Negative for itching.  Respiratory: Negative for cough, chest tightness, shortness of breath and wheezing.  Cardiovascular: Negative for chest pain.    Gastrointestinal: Negative for abdominal pain.  Genitourinary: Negative for difficulty urinating.  Skin: Negative for rash.  Allergic/Immunologic: Negative for environmental allergies and food allergies.  Neurological: Positive for headaches.   Objective: BP 122/76 (BP Location: Right Arm, Patient Position: Sitting, Cuff Size: Normal)    Pulse 88    Temp 97.8 F (36.6 C) (Tympanic)    Resp 18    Ht '5\' 8"'$  (1.727 m)    Wt 221 lb (100.2 kg)    LMP 08/05/2014 (Approximate)    SpO2 98%    BMI 33.60 kg/m  Body mass index is 33.6 kg/m. Physical Exam  Constitutional: She is oriented to person, place, and time. She appears well-developed and well-nourished.  HENT:  Head: Normocephalic and atraumatic.  Right Ear: External ear normal.  Left Ear: External ear normal.  Nose: Nose normal.  Mouth/Throat: Oropharynx is clear and moist.  Eyes: Conjunctivae and EOM are normal.  Neck: Neck supple.  Cardiovascular: Normal rate, regular rhythm and normal heart sounds. Exam reveals no gallop and no friction rub.  No murmur heard. Pulmonary/Chest: Effort normal and breath sounds normal. She has no wheezes. She has no rales.  Abdominal: Soft. Bowel sounds are normal. There is no abdominal tenderness.  Lymphadenopathy:    She has no cervical adenopathy.  Neurological: She is alert and oriented to person, place, and time.  Skin: Skin is warm. No rash noted.  Psychiatric: She has a normal mood and affect. Her behavior is normal.  Nursing note and vitals reviewed.  The plan was reviewed with the patient/family, and all questions/concerned were addressed.  It was my pleasure to see Kirat today and participate in her care. Please feel free to contact me with any questions or concerns.  Sincerely,  Rexene Alberts, DO Allergy & Immunology  [facility] office: [phone] [facility] office: [phone]

## 2019-03-04 ENCOUNTER — Encounter: Payer: Self-pay | Admitting: Allergy

## 2019-03-04 ENCOUNTER — Ambulatory Visit (INDEPENDENT_AMBULATORY_CARE_PROVIDER_SITE_OTHER): Payer: PPO | Admitting: Allergy

## 2019-03-04 ENCOUNTER — Other Ambulatory Visit: Payer: Self-pay

## 2019-03-04 VITALS — BP 140/80 | HR 68 | Temp 98.3°F | Resp 16 | Ht 68.0 in | Wt 224.4 lb

## 2019-03-04 DIAGNOSIS — J310 Chronic rhinitis: Secondary | ICD-10-CM | POA: Insufficient documentation

## 2019-03-04 DIAGNOSIS — T781XXD Other adverse food reactions, not elsewhere classified, subsequent encounter: Secondary | ICD-10-CM | POA: Diagnosis not present

## 2019-03-04 DIAGNOSIS — T781XXA Other adverse food reactions, not elsewhere classified, initial encounter: Secondary | ICD-10-CM | POA: Insufficient documentation

## 2019-03-04 DIAGNOSIS — R12 Heartburn: Secondary | ICD-10-CM | POA: Insufficient documentation

## 2019-03-04 DIAGNOSIS — T7819XA Other adverse food reactions, not elsewhere classified, initial encounter: Secondary | ICD-10-CM | POA: Insufficient documentation

## 2019-03-04 DIAGNOSIS — F3130 Bipolar disorder, current episode depressed, mild or moderate severity, unspecified: Secondary | ICD-10-CM | POA: Diagnosis not present

## 2019-03-04 DIAGNOSIS — R0989 Other specified symptoms and signs involving the circulatory and respiratory systems: Secondary | ICD-10-CM

## 2019-03-04 NOTE — Assessment & Plan Note (Signed)
Patient concerned for food allergies as noting some tongue/throat pruritus, globus sensation, throat clearing for the past 6 months. No specific food triggers noted. 2019 2019 immunocap was negative to wheat, rye, barley, oat, corn, rice. No recent EGD.  Placed skin testing today but data was not interpretable as patient put her clothes back on right after placing the test. Will ask patient to return for skin testing.  Will make additional recommendations based on results.

## 2019-03-04 NOTE — Assessment & Plan Note (Signed)
.   See assessment and plan as above. 

## 2019-03-04 NOTE — Patient Instructions (Addendum)
Today's skin testing showed: Borderline to celery. I do not think that it's causing you these issues.   Start pepcid 20mg  twice a day.   Recommend EGD   Start heartburn lifestyle modification diet. - avoid alcohol, spicy/acidic foods, caffeine  Keep food diary/diary of symptoms.  Follow up in 2 months

## 2019-03-04 NOTE — Progress Notes (Signed)
Follow Up Note  RE: Samantha [name] MRN: [id number] DOB: [birth year] Date of Office Visit: 03/04/2019  Referring provider: [name], DO Primary care provider: [name], DO  Chief Complaint: Allergy Testing  History of Present Illness: I had the pleasure of seeing Samantha [name] for a follow up visit at the [facility] [place] on 03/04/2019. She is a 38 y.o. female, who is being followed for possible adverse food reaction, globus sensation. Today she is here for skin testing. Her previous allergy office visit was on 02/28/2019 with Dr. Maudie Mercury.   Symptoms are unchanged. No antihistamines for the past 5+ days.   Assessment and Plan: Samantha [name] is a 38 y.o. female with: Adverse food reaction Past history - Patient concerned for food allergies as noting some tongue/throat pruritus, globus sensation, throat clearing for the past 6 months. No specific food triggers noted. 2019 immunocap was negative to wheat, rye, barley, oat, corn, rice. No recent EGD. Interim history - unchanged symptoms.   Today's skin testing was only borderline positive to celery which patient does not consume. Rest of the food panel was negative.  Advised patient to keep food diary along with symptoms.  Based on clinical history concern more of heartburn issues causing the globus sensation but that does not explain her oral pruritus symptoms unless she is experiencing oral allergy syndrome which is plausible as she does have some rhinitis symptoms during the pollen seasons.   Start heartburn lifestyle modification diet - avoid alcohol, spicy/acidic foods, caffeine.  Start Pepcid '20mg'$  twice a day.   Recommend EGD if symptoms persistent.  Globus sensation See assessment and plan as above.  Chronic rhinitis Som rhinitis symptoms in the spring. Takes OTC antihistamines with good benefit.  We were unable to do skin testing to environmental allergies today as the priority was food testing and there was  not enough space on the back to add on environmental allergy panel.   If food diary seems more consistent with pollen oral allergy syndrome then will do environmental allergy panel testing at next visit.  May use over the counter antihistamines such as Zyrtec (cetirizine), Claritin (loratadine), Allegra (fexofenadine), or Xyzal (levocetirizine) daily as needed.  Return in about 2 months (around 05/04/2019).  Diagnostics: Skin Testing: Food allergy panel. Positive test to: borderline to celery. Negative test to: everything else.  Results discussed with patient/family. Food Adult Perc - 03/04/19 0900    Time Antigen Placed  0900    Allergen Manufacturer  Lavella Hammock    Location  Back    Number of allergen test  72     Control-buffer 50% Glycerol  Negative    Control-Histamine 1 mg/ml  3+    1. Peanut  Negative    2. Soybean  Negative    3. Wheat  Negative    4. Sesame  Negative    5. Milk, cow  Negative    6. Egg White, Chicken  Negative    7. Casein  Negative    8. Shellfish Mix  Negative    9. Fish Mix  Negative    10. Cashew  Negative    11. Pecan Food  Negative    12. [place]  Negative    13. Almond  Negative    14. Hazelnut  Negative    15. [place] nut  Negative    16. Coconut  Negative    17. Pistachio  Negative    18. Catfish  Negative    19. Elgie [place]  Negative    20. Trout  Negative    21. Tuna  Negative    22. Salmon  Negative    23. Flounder  Negative    24. Codfish  Negative    25. Shrimp  Negative    26. Crab  Negative    27. Lobster  Negative    28. Oyster  Negative    29. Scallops  Negative    30. Barley  Negative    31. Oat   Negative    32. Rye   Negative    33. Hops  Negative    34. Rice  Negative    35. Cottonseed  Negative    36. Saccharomyces Cerevisiae   Negative    37. Pork  Negative    38. [place] Meat  Negative    39. Chicken Meat  Negative    40. Beef  Negative    41. Lamb  Negative    42. Tomato  Negative    43. White Potato  Negative     44. Sweet Potato  Negative    45. Pea, Green/English  Negative    46. Navy Bean  Negative    47. Mushrooms  Negative    48. Avocado  Negative    49. Onion  Negative    50. Cabbage  Negative    51. Carrots  Negative    52. Celery  --   +/-   53. Corn  Negative    54. Cucumber  Negative    55. Grape (White seedless)  Negative    56. Orange   Negative    57. Banana  Negative    58. Apple  Negative    59. Peach  Negative    60. Strawberry  Negative    61. Cantaloupe  Negative    62. Watermelon  Negative    63. Pineapple  Negative    64. Chocolate/Cacao bean  Negative    65. Karaya Gum  Negative    66. Acacia (Arabic Gum)  Negative    67. Cinnamon  Negative    68. Nutmeg  Negative    69. Ginger  Negative    70. Garlic  Negative    71. Pepper, black  Negative    72. Mustard  Negative       Medication List:  Current Outpatient Medications  Medication Sig Dispense Refill  . blood glucose meter kit and supplies Dispense based on patient and insurance preference. Use to check blood glucose levels fasting in the morning and 2 hours after largest meal daily.  (FOR ICD-10 E10.9, E11.9). 1 each 0  . famotidine (PEPCID) 20 MG tablet Take 20 mg by mouth 2 (two) times daily.    . metFORMIN (GLUCOPHAGE) 500 MG tablet Take '250mg'$  every 12 hours after food 360 tablet 0  . pregabalin (LYRICA) 75 MG capsule Take 75 mg by mouth 2 (two) times daily.    . valACYclovir (VALTREX) 500 MG tablet valacyclovir 500 mg tablet     No current facility-administered medications for this visit.    Allergies: Allergies  Allergen Reactions  . Sulfa Antibiotics Anaphylaxis and Swelling  . Topiramate Swelling    Tongue  . Duloxetine Hcl Other (See Comments)    Weight gain, feels "crazy"  . Penicillins Rash    Has patient had a PCN reaction causing immediate rash, facial/tongue/throat swelling, SOB or lightheadedness with hypotension: Yes Has patient had a PCN reaction causing severe rash involving mucus  membranes or  skin necrosis: No Has patient had a PCN reaction that required hospitalization No Has patient had a PCN reaction occurring within the last 10 years: No If all of the above answers are "NO", then may proceed with Cephalosporin use.    I reviewed her past medical history, social history, family history, and environmental history and no significant changes have been reported from previous visit on 02/28/2019.  Review of Systems  Constitutional: Negative for appetite change, chills, fever and unexpected weight change.  HENT: Negative for congestion and rhinorrhea.   Eyes: Negative for itching.  Respiratory: Negative for cough, chest tightness, shortness of breath and wheezing.   Cardiovascular: Negative for chest pain.  Gastrointestinal: Negative for abdominal pain.  Genitourinary: Negative for difficulty urinating.  Skin: Negative for rash.  Allergic/Immunologic: Negative for environmental allergies and food allergies.  Neurological: Positive for headaches.   Objective: BP 140/80 (BP Location: Left Arm, Patient Position: Sitting, Cuff Size: Large)   Pulse 68   Temp 98.3 F (36.8 C) (Oral)   Resp 16   Ht '5\' 8"'$  (1.727 m)   Wt 224 lb 6.4 oz (101.8 kg)   LMP 08/05/2014 (Approximate)   SpO2 97%   BMI 34.12 kg/m  Body mass index is 34.12 kg/m. Physical Exam  Constitutional: She is oriented to person, place, and time. She appears well-developed and well-nourished.  HENT:  Head: Normocephalic and atraumatic.  Eyes: Conjunctivae and EOM are normal.  Neck: Neck supple.  Cardiovascular: Normal rate, regular rhythm and normal heart sounds. Exam reveals no gallop and no friction rub.  No murmur heard. Pulmonary/Chest: Effort normal and breath sounds normal. She has no wheezes. She has no rales.  Abdominal: Soft.  Neurological: She is alert and oriented to person, place, and time.  Skin: Skin is warm. No rash noted.  Psychiatric: She has a normal mood and affect. Her behavior  is normal.  Nursing note and vitals reviewed.  Previous notes and tests were reviewed. The plan was reviewed with the patient/family, and all questions/concerned were addressed.  It was my pleasure to see Samantha [name] today and participate in her care. Please feel free to contact me with any questions or concerns.  Sincerely,  Rexene Alberts, DO Allergy & Immunology  [facility] office: [phone] [facility] office: [phone]

## 2019-03-04 NOTE — Assessment & Plan Note (Signed)
Past history - Patient concerned for food allergies as noting some tongue/throat pruritus, globus sensation, throat clearing for the past 6 months. No specific food triggers noted. 2019 2019 immunocap was negative to wheat, rye, barley, oat, corn, rice. No recent EGD. Interim history - unchanged symptoms.   Today's skin testing was only borderline positive to celery which patient does not consume. Rest of the food panel was negative.  Advised patient to keep food diary along with symptoms.  Based on clinical history concern more of heartburn issues causing the globus sensation but that does not explain her oral pruritus symptoms unless she is experiencing oral allergy syndrome which is plausible as she does have some rhinitis symptoms during the pollen seasons.   Start heartburn lifestyle modification diet - avoid alcohol, spicy/acidic foods, caffeine.  Start Pepcid 20mg  twice a day.   Recommend EGD if symptoms persistent.

## 2019-03-04 NOTE — Assessment & Plan Note (Addendum)
Past history - Patient concerned for food allergies as noting some tongue/throat pruritus, globus sensation, throat clearing for the past 6 months. No specific food triggers noted. 2019 immunocap was negative to wheat, rye, barley, oat, corn, rice. No recent EGD. Interim history - unchanged symptoms.   Today's skin testing was only borderline positive to celery which patient does not consume. Rest of the food panel was negative.  Advised patient to keep food diary along with symptoms.  Based on clinical history concern more of heartburn issues causing the globus sensation but that does not explain her oral pruritus symptoms unless she is experiencing oral allergy syndrome which is plausible as she does have some rhinitis symptoms during the pollen seasons.   Start heartburn lifestyle modification diet - avoid alcohol, spicy/acidic foods, caffeine.  Start Pepcid 20mg  twice a day.   Recommend EGD if symptoms persistent.

## 2019-03-04 NOTE — Assessment & Plan Note (Signed)
Som rhinitis symptoms in the spring. Takes OTC antihistamines with good benefit.  We were unable to do skin testing to environmental allergies today as the priority was food testing and there was not enough space on the back to add on environmental allergy panel.   If food diary seems more consistent with pollen oral allergy syndrome then will do environmental allergy panel testing at next visit.  May use over the counter antihistamines such as Zyrtec (cetirizine), Claritin (loratadine), Allegra (fexofenadine), or Xyzal (levocetirizine) daily as needed.

## 2019-03-05 ENCOUNTER — Telehealth: Payer: Self-pay | Admitting: Family Medicine

## 2019-03-05 NOTE — Telephone Encounter (Signed)
Patient is wondering if she can get something prescribed for anxiety. 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 Please advise if this is something we can do please.

## 2019-03-05 NOTE — Telephone Encounter (Signed)
Called patient and made appointment for tomorrow. MPulliam, CMA/RT(R)

## 2019-03-06 ENCOUNTER — Other Ambulatory Visit: Payer: Self-pay

## 2019-03-06 ENCOUNTER — Encounter: Payer: Self-pay | Admitting: Family Medicine

## 2019-03-06 ENCOUNTER — Ambulatory Visit (INDEPENDENT_AMBULATORY_CARE_PROVIDER_SITE_OTHER): Payer: PPO | Admitting: Family Medicine

## 2019-03-06 DIAGNOSIS — F411 Generalized anxiety disorder: Secondary | ICD-10-CM | POA: Diagnosis not present

## 2019-03-06 DIAGNOSIS — R454 Irritability and anger: Secondary | ICD-10-CM | POA: Diagnosis not present

## 2019-03-06 DIAGNOSIS — E118 Type 2 diabetes mellitus with unspecified complications: Secondary | ICD-10-CM

## 2019-03-06 MED ORDER — FLUOXETINE HCL 10 MG PO TABS: ORAL_TABLET | ORAL | 0 refills | Status: DC

## 2019-03-06 MED ORDER — LIRAGLUTIDE 18 MG/3ML ~~LOC~~ SOPN: PEN_INJECTOR | SUBCUTANEOUS | 1 refills | Status: DC

## 2019-03-06 NOTE — Progress Notes (Signed)
Virtual Visit via Telephone Note for [employer], D.O- Primary Care Physician at [facility]   I connected with current patient today by telephone and verified that I am speaking with the correct person using two identifiers.   I discussed the limitations, risks, security and privacy concerns of performing an evaluation and management service by telephone and the limited availability of in person appointments during this current national crisis of the Covid-19 pandemic.  My staff members also discussed with the patient that there may be a patient responsible charge related to this service.  The patient expressed understanding and agreed to proceed.     History of Present Illness:  HPI:   DM - poor control;  worsening FBS/ rarely checking PP BS.   Not taking care of self and little exercise. Denies SX currently. No complaints   GAD -  pt with a lot of worry since COVID-19 outbrk. Short fuse, angered easily.  USed several meds in past that "didn't work for her".     Wt Readings from Last 3 Encounters:  03/04/19 224 lb 6.4 oz (101.8 kg)  02/28/19 221 lb (100.2 kg)  01/01/19 225 lb 1.6 oz (102.1 kg)   BP Readings from Last 3 Encounters:  03/04/19 140/80  02/28/19 122/76  01/04/19 (!) 140/98   Pulse Readings from Last 3 Encounters:  03/04/19 68  02/28/19 88  01/04/19 93   BMI Readings from Last 3 Encounters:  03/04/19 34.12 kg/m  02/28/19 33.60 kg/m  01/01/19 32.30 kg/m     Patient Care Team    Relationship Specialty Notifications Start End  [name], DO PCP - General Family Medicine  09/29/16   Pedro Earls, MD Attending Physician Family Medicine  09/29/16   Carol Ada, MD Consulting Physician Gastroenterology  06/19/17   Marcial Pacas, MD Consulting Physician Neurology  08/24/17   Willia Craze, NP Nurse Practitioner Gastroenterology  04/06/18   Ob/Gyn, Esmond Plants    07/24/18      Patient Active Problem List   Diagnosis Date Noted   . Type 2 diabetes mellitus with complication, without long-term current use of insulin ([place]) 12/29/2015    Priority: High  . Obesity (BMI 30-39.9) 02/17/2014    Priority: High  . Acute folliculitis- likely due to MRSA, they are draining 06/19/2017    Priority: Medium  . Personal history of MRSA (methicillin resistant Staphylococcus aureus) 06/19/2017    Priority: Medium  . Environmental and seasonal allergies 09/29/2016    Priority: Medium  . GERD (gastroesophageal reflux disease) 09/29/2016    Priority: Medium  . Bipolar 1 disorder - Anxiety 09/28/2016    Priority: Medium  . Fatty liver 09/05/2014    Priority: Medium  . Chronic diarrhea  06/19/2017    Priority: Low  . Contact dermatitis and eczema- due to tape\ Band-Aid 06/19/2017    Priority: Low  . Fibromyalgia 08/11/2016    Priority: Low  . OSA on CPAP 12/08/2014    Priority: Low  . Obstructive sleep apnea syndrome, moderate 11/24/2014    Priority: Low  . S/P abdominal hysterectomy 08/15/2014    Priority: Low  . Irritability and anger 03/06/2019  . Heartburn 03/04/2019  . Chronic rhinitis 03/04/2019  . Adverse food reaction 03/04/2019  . Globus sensation 02/28/2019  . Finger pain, left 01/01/2019  . Allergy history unknown- possible throat closure sensation to certain foods 58/30/9407  . Neuropathic pain 09/04/2018  . Chronic radicular pain of lower back  09/04/2018  . Lumbar foraminal stenosis-L5-S1 09/04/2018  . Osteoarthritis of spine with radiculopathy, lumbar region 09/04/2018  . Obesity, Class I, BMI 30-34.9 09/04/2018  . Piriformis syndrome, left 07/12/2018  . Single episode of elevated blood pressure 07/12/2018  . Hypertriglyceridemia 04/11/2018  . Low level of high density lipoprotein (HDL) 04/11/2018  . Vitamin D insufficiency 04/11/2018  . Epigastric abdominal pain with N 04/06/2018  . History of vitamin D deficiency 04/06/2018  . Spondylosis without myelopathy or radiculopathy, lumbar region 10/03/2017   . Abdominal pain, LUQ 05/02/2017  . Abdominal pain, LLQ 05/02/2017  . Hx of diarrhea 05/02/2017  . Bladder dysfunction 04/30/2014  . Nephrolithiasis 03/04/2014  . Agenesis, corpus callosum ([place]) 02/17/2014  . GAD (generalized anxiety disorder) 02/17/2014  . History of blood transfusion 02/17/2014     Current Meds  Medication Sig  . blood glucose meter kit and supplies Dispense based on patient and insurance preference. Use to check blood glucose levels fasting in the morning and 2 hours after largest meal daily.  (FOR ICD-10 E10.9, E11.9).  . famotidine (PEPCID) 20 MG tablet Take 20 mg by mouth 2 (two) times daily.  . metFORMIN (GLUCOPHAGE) 500 MG tablet Take '250mg'$  every 12 hours after food  . pregabalin (LYRICA) 75 MG capsule Take 75 mg by mouth 2 (two) times daily.  . valACYclovir (VALTREX) 500 MG tablet valacyclovir 500 mg tablet     Allergies:  Allergies  Allergen Reactions  . Sulfa Antibiotics Anaphylaxis and Swelling  . Topiramate Swelling    Tongue  . Duloxetine Hcl Other (See Comments)    Weight gain, feels "crazy"  . Penicillins Rash    Has patient had a PCN reaction causing immediate rash, facial/tongue/throat swelling, SOB or lightheadedness with hypotension: Yes Has patient had a PCN reaction causing severe rash involving mucus membranes or skin necrosis: No Has patient had a PCN reaction that required hospitalization No Has patient had a PCN reaction occurring within the last 10 years: No If all of the above answers are "NO", then may proceed with Cephalosporin use.      ROS:  See above HPI for pertinent positives and negatives   Objective:   Last menstrual period 08/05/2014. There is no height or weight on file to calculate BMI.   General: sounds in no acute distress.  Skin: Pt confirms warm and dry  extremities and pink fingertips Respiratory: speaking in full sentences, no conversational dyspnea Psych: A and O *3, appears insight good, mood- full       Impression and Recommendations:    Type 2 diabetes mellitus with complication, without long-term current use of insulin ([place])  - not well controlled FBS at home- since up a little or no change from prior OV's with A1c in 11/16/18 being 7.0- add med - long d/c pt re: R/B meds- d/c pt that this will cause N/V if she eats too much or too many carbs etc - check BS's- keep log etc- all d/c pt - Plan: START liraglutide (VICTOZA) 18 MG/3ML SOPN, in add in metformin low dose     Health counseling: - Novel Covid -19 counseling done; all questions were answered.   - Current CDC and federal guidelines reviewed with patient  - Reminded pt of extreme importance of social distancing; minimizing contacts with others, avoiding ALL but emergency appts etc. - told to call with any concerns     GAD (generalized anxiety disorder) / irritable / anger - since pt did not tol. celexa and wellbutrin  in past, and pt is looking for " the fastest relief" and NOT gain any wt--> thus trial of fluoxetine - - Counseled patient on pathophysiology of disease and discussed various treatment options, which often includes dietary and lifestyle modifications as first line, in addition to discussing the risks and benefits of various medications.  - Importance of healthy eating, getting adequate sleep, daily exercise and seeking the help of a professional counselor discussed.  Pt encouraged to call one for an appt for counseling.  - Meditation and relaxation techniques discussed with patient.   Deep breathing/ Square breathing exercises reviewed.  - Encouraged daily exercise - Anticipatory guidance given and pt was encouraged to return to clinic or call the office with any further questions or concerns. - Plan: Start FLUoxetine (PROZAC) 10 MG tablet    Patient was told to follow-up in 6 weeks since starting Victoza and Prozac as new medicines.  She will let me know what her fasting blood sugar and 2-hour postprandial  blood sugars are. -Then, goal is to have her follow-up in 3 months to recheck A1c after she is on it for 3 months as well, patient will need full set of fasting blood work as she is due for it in May 2020. (Since blood sugar is worsening, we should get a full set of labs to see how kidney function is, cholesterol is etc. Etc.)   ---> I asked my CMA to place future orders via a message sent to her after OV   I discussed the assessment and treatment plan with the patient. The patient was provided an opportunity to ask questions and all were answered. The patient agreed with the plan and demonstrated an understanding of the instructions.   The patient was advised to call back or seek an in-person evaluation if the symptoms worsen or if the condition fails to improve as anticipated.     Meds ordered this encounter  Medications  . liraglutide (VICTOZA) 18 MG/3ML SOPN    Sig: 0.'6mg'$  [place] qd * 2 wks, then inc to 1.'2mg'$  [place] qd as tolerated    Dispense:  6 pen    Refill:  1  . FLUoxetine (PROZAC) 10 MG tablet    Sig: Use one half tablet daily for 1 week then increase to 1 tablet daily    Dispense:  60 tablet    Refill:  0     Gross side effects, risk and benefits, and alternatives of medications and treatment plan in general discussed with patient.  Patient is aware that all medications have potential side effects and we are unable to predict every side effect or drug-drug interaction that may occur.   Patient was strongly encouraged to call with any questions or concerns they may have concerns.    Expresses verbal understanding and consents to current therapy and treatment regimen.  No barriers to understanding were identified.  Red flag symptoms and signs discussed in detail.  Patient expressed understanding regarding what to do in case of emergency\urgent symptoms  Please see AVS handed out to patient at the end of our visit for further patient instructions/ counseling done pertaining to today's  office visit.  I provided 24 minutes of non-face-to-face time during this encounter.     [name], DO

## 2019-03-07 ENCOUNTER — Other Ambulatory Visit: Payer: Self-pay

## 2019-03-07 DIAGNOSIS — E559 Vitamin D deficiency, unspecified: Secondary | ICD-10-CM

## 2019-03-07 DIAGNOSIS — E669 Obesity, unspecified: Secondary | ICD-10-CM

## 2019-03-07 DIAGNOSIS — K760 Fatty (change of) liver, not elsewhere classified: Secondary | ICD-10-CM

## 2019-03-07 DIAGNOSIS — E781 Pure hyperglyceridemia: Secondary | ICD-10-CM

## 2019-03-07 DIAGNOSIS — F411 Generalized anxiety disorder: Secondary | ICD-10-CM

## 2019-03-07 DIAGNOSIS — M797 Fibromyalgia: Secondary | ICD-10-CM

## 2019-03-07 DIAGNOSIS — E118 Type 2 diabetes mellitus with unspecified complications: Secondary | ICD-10-CM

## 2019-03-07 DIAGNOSIS — Q040 Congenital malformations of corpus callosum: Secondary | ICD-10-CM

## 2019-03-19 ENCOUNTER — Ambulatory Visit: Payer: PPO | Admitting: Family Medicine

## 2019-03-21 DIAGNOSIS — G43709 Chronic migraine without aura, not intractable, without status migrainosus: Secondary | ICD-10-CM | POA: Diagnosis not present

## 2019-03-27 ENCOUNTER — Other Ambulatory Visit: Payer: PPO

## 2019-04-17 ENCOUNTER — Ambulatory Visit (INDEPENDENT_AMBULATORY_CARE_PROVIDER_SITE_OTHER): Payer: PPO | Admitting: Family Medicine

## 2019-04-17 ENCOUNTER — Encounter: Payer: Self-pay | Admitting: Family Medicine

## 2019-04-17 ENCOUNTER — Other Ambulatory Visit: Payer: Self-pay

## 2019-04-17 VITALS — Temp 96.0°F | Ht 68.0 in | Wt 211.0 lb

## 2019-04-17 DIAGNOSIS — R454 Irritability and anger: Secondary | ICD-10-CM | POA: Diagnosis not present

## 2019-04-17 DIAGNOSIS — E669 Obesity, unspecified: Secondary | ICD-10-CM | POA: Diagnosis not present

## 2019-04-17 DIAGNOSIS — F411 Generalized anxiety disorder: Secondary | ICD-10-CM | POA: Diagnosis not present

## 2019-04-17 DIAGNOSIS — E118 Type 2 diabetes mellitus with unspecified complications: Secondary | ICD-10-CM | POA: Diagnosis not present

## 2019-04-17 MED ORDER — FLUOXETINE HCL 20 MG PO TABS: ORAL_TABLET | ORAL | 0 refills | Status: DC

## 2019-04-17 MED ORDER — LIRAGLUTIDE 18 MG/3ML ~~LOC~~ SOPN: PEN_INJECTOR | SUBCUTANEOUS | 1 refills | Status: DC

## 2019-04-17 NOTE — Progress Notes (Signed)
Telehealth office visit note for Samantha [name], D.O- at Primary Care at [facility]   I connected with current patient today and verified that I am speaking with the correct person using two identifiers.   . Location of the patient: Home . Location of the provider: Office Only the patient (+/- their family members at pt's discretion) and myself were participating in the encounter    - This visit type was conducted due to national recommendations for restrictions regarding the COVID-19 Pandemic (e.g. social distancing) in an effort to limit this patient's exposure and mitigate transmission in our community.  This format is felt to be most appropriate for this patient at this time.   - The patient did not have access to video technology or had technical difficulties with video requiring transitioning to audio format only. - No physical exam could be performed with this format, beyond that communicated to [place] by the patient/ family members as noted.   - Additionally my office staff/ schedulers discussed with the patient that there may be a monetary charge related to this service, depending on their medical insurance.   The patient expressed understanding, and agreed to proceed.       History of Present Illness:  Last office visit was 03/06/2019.  Plan from last OV:  Patient was told to follow-up in 6 weeks since starting Victoza and Prozac as new medicines.  She will let me know what her fasting blood sugar and 2-hour postprandial blood sugars are. -Then, goal is to have her follow-up in 3 months to recheck A1c after she is on it for 3 months as well, patient will need full set of fasting blood work as she is due for it in May 2020. (Since blood sugar is worsening, we should get a full set of labs to see how kidney function is, cholesterol is etc. Etc.)    Diabetes mellitus: - Metformin made her feel sick again- she stopped it.   She can tolerate Victoza well- now up to 1.2 mg qd.  At  that time her diabetes was worsening and she was rarely checking her blood sugars.  She was not taking care of herself and she was moving very little. - We started her on Victoza in addition to the metformin low dose.  She does have a lot of GI problems and whom she follows with gastroenterology for.  We did extensive counseling on this medicine.  She is here for follow-up today. --> She has been occ checking BS's- FBS-  She can't find BS log papers or her glucometer.  Checking 3 mornings/ week.  Thinks highest was 190 one time, but others 110's- 120's.  - her wt has come down- was at [place] now 211.  Gave up Coke/ soda- no caloric beverages- drinks Diet Dr Malachi Bonds  Lab Results  Component Value Date   HGBA1C 7.0 (A) 11/08/2018   HGBA1C 6.9 07/12/2018   HGBA1C 7.2 (H) 04/06/2018    GAD: Additionally due to her irritability and anger and anxiousness, we did a trial of fluoxetine as well.  This medicine was started last office visit. - she is at one tab per day now.   She feels like she is herself on it and feels much better.  Husband says he thinks she is doing very well and states that she "now likes to be around folks."  - 4 acres- she push mows once wkly.  Also bought a stairclimber    Impression  and Recommendations:    1. Obesity (BMI 30-39.9)   2. GAD (generalized anxiety disorder)   3. Irritability and anger   4. Type 2 diabetes mellitus with complication, without long-term current use of insulin ([place])     -Patient requested an increase in her fluoxetine-as she feels good but thinks a little bit more would be best..  Today we increased Prozac from 10 to 20 mg.  Reminded patient that diet, exercise, meditation and prayer, good sleep hygiene etc. etc. is all important in treating mood disorders.  It is not just about popping a pill.  Patient declined going to a counselor at this time.  Patient will follow-up sooner than planned if she has a problem going from 10 mg to 20 mg. -I again  discussed with patient that with her history of bipolar 1-anxiety predominance, that starting Prozac could put her into a more manic phase.  Explained to patient what the symptoms would be and to notify me immediately if she even slightly begins to feel this way.  -Patient is tolerating the Victoza very well.  She has had a 17 pound weight loss as well.  Unfortunately she cannot tolerate metformin and I asked the CMA to please put this on her allergy list as a side effect of GI discomfort.  Strongly encouraged to continue to monitor her blood sugars several times per week fasting and 2-hour postprandial and write this down.  She should bring in this log of blood sugars every office visit she has with me.  -Importance of routine follow-up for chronic conditions again stressed with patient.    -Obesity counseling: I also highlighted the fact that her BMI is coming down and now 32.  BMI counseling done as lowering BMI will lead to better control of her diabetes and other conditions.   - As part of my medical decision making, I reviewed the following data within the [place] History obtained from pt /family, CMA notes reviewed and incorporated if applicable, Labs reviewed, Radiograph/ tests reviewed if applicable and OV notes from prior OV's with me, as well as other specialists she/he has seen since seeing me last, were all reviewed and used in my medical decision making process today.   - Additionally, discussion had with patient regarding txmnt plan, and their biases/concerns about that plan were used in my medical decision making today.   - The patient agreed with the plan and demonstrated an understanding of the instructions.   No barriers to understanding were identified.   - Red flag symptoms and signs discussed in detail.  Patient expressed understanding regarding what to do in case of emergency_0 urgent symptoms.  The patient was advised to call back or seek an in-person evaluation  if the symptoms worsen or if the condition fails to improve as anticipated.   Return for 2) 42moearly July- FBW, then ov with me in 3 mo from today (inc prozac).    Orders for fasting blood work are already in the chart.  She will come in and get these in 2 months-she will be on the Victoza for total of 3 months then.   Meds ordered this encounter  Medications  . FLUoxetine (PROZAC) 20 MG tablet    Sig: 1 tablet daily    Dispense:  90 tablet    Refill:  0  . liraglutide (VICTOZA) 18 MG/3ML SOPN    Sig: 1.261mSC qd    Dispense:  6 pen    Refill:  1  Medications Discontinued During This Encounter  Medication Reason  . metFORMIN (GLUCOPHAGE) 500 MG tablet Side effect (s)  . FLUoxetine (PROZAC) 10 MG tablet  increased dose  . liraglutide (VICTOZA) 18 MG/3ML SOPN Reorder      I provided 19+ minutes of non-face-to-face time during this encounter,with over 50% of the time in direct counseling on patients medical conditions/ medical concerns.  Additional time was spent with charting and coordination of care after the actual visit commenced.   Note:  This note was prepared with assistance of Dragon voice recognition software. Occasional wrong-word or sound-a-like substitutions may have occurred due to the inherent limitations of voice recognition software.  Samantha Dance, DO      Patient Care Team    Relationship Specialty Notifications Start End  Samantha Dance, DO PCP - General Family Medicine  09/29/16   Pedro Earls, MD Attending Physician Family Medicine  09/29/16   Carol Ada, MD Consulting Physician Gastroenterology  06/19/17   Marcial Pacas, MD Consulting Physician Neurology  08/24/17   Willia Craze, NP Nurse Practitioner Gastroenterology  04/06/18   Ob/Gyn, Esmond Plants    07/24/18      -Vitals obtained; medications/ allergies reconciled;  personal medical, social, Sx etc.histories were updated by CMA, reviewed by me and are reflected in chart   Patient  Active Problem List   Diagnosis Date Noted  . Type 2 diabetes mellitus with complication, without long-term current use of insulin ([place]) 12/29/2015    Priority: High  . Obesity (BMI 30-39.9) 02/17/2014    Priority: High  . Acute folliculitis- likely due to MRSA, they are draining 06/19/2017    Priority: Medium  . Personal history of MRSA (methicillin resistant Staphylococcus aureus) 06/19/2017    Priority: Medium  . Environmental and seasonal allergies 09/29/2016    Priority: Medium  . GERD (gastroesophageal reflux disease) 09/29/2016    Priority: Medium  . Bipolar 1 disorder - Anxiety 09/28/2016    Priority: Medium  . Fatty liver 09/05/2014    Priority: Medium  . Chronic diarrhea  06/19/2017    Priority: Low  . Contact dermatitis and eczema- due to tape_0 Band-Aid 06/19/2017    Priority: Low  . Fibromyalgia 08/11/2016    Priority: Low  . OSA on CPAP 12/08/2014    Priority: Low  . Obstructive sleep apnea syndrome, moderate 11/24/2014    Priority: Low  . S/P abdominal hysterectomy 08/15/2014    Priority: Low  . Irritability and anger 03/06/2019  . Heartburn 03/04/2019  . Chronic rhinitis 03/04/2019  . Adverse food reaction 03/04/2019  . Globus sensation 02/28/2019  . Finger pain, left 01/01/2019  . Allergy history unknown- possible throat closure sensation to certain foods 16/12/930  . Neuropathic pain 09/04/2018  . Chronic radicular pain of lower back 09/04/2018  . Lumbar foraminal stenosis-L5-S1 09/04/2018  . Osteoarthritis of spine with radiculopathy, lumbar region 09/04/2018  . Obesity, Class I, BMI 30-34.9 09/04/2018  . Piriformis syndrome, left 07/12/2018  . Single episode of elevated blood pressure 07/12/2018  . Hypertriglyceridemia 04/11/2018  . Low level of high density lipoprotein (HDL) 04/11/2018  . Vitamin D insufficiency 04/11/2018  . Epigastric abdominal pain with N 04/06/2018  . History of vitamin D deficiency 04/06/2018  . Spondylosis without myelopathy  or radiculopathy, lumbar region 10/03/2017  . Abdominal pain, LUQ 05/02/2017  . Abdominal pain, LLQ 05/02/2017  . Hx of diarrhea 05/02/2017  . Bladder dysfunction 04/30/2014  . Nephrolithiasis 03/04/2014  . Agenesis, corpus callosum ([place]) 02/17/2014  .  GAD (generalized anxiety disorder) 02/17/2014  . History of blood transfusion 02/17/2014     Current Meds  Medication Sig  . amitriptyline (ELAVIL) 10 MG tablet Take 1 tablet by mouth daily.  . blood glucose meter kit and supplies Dispense based on patient and insurance preference. Use to check blood glucose levels fasting in the morning and 2 hours after largest meal daily.  (FOR ICD-10 E10.9, E11.9).  . famotidine (PEPCID) 20 MG tablet Take 20 mg by mouth 2 (two) times daily.  Marland Kitchen FLUoxetine (PROZAC) 20 MG tablet 1 tablet daily  . liraglutide (VICTOZA) 18 MG/3ML SOPN 1.23m [place] qd  . pregabalin (LYRICA) 75 MG capsule Take 75 mg by mouth 2 (two) times daily.  . valACYclovir (VALTREX) 500 MG tablet valacyclovir 500 mg tablet  . [DISCONTINUED] FLUoxetine (PROZAC) 10 MG tablet Use one half tablet daily for 1 week then increase to 1 tablet daily  . [DISCONTINUED] liraglutide (VICTOZA) 18 MG/3ML SOPN 0.670mSC qd * 2 wks, then inc to 1.21m23mC qd as tolerated     Allergies:  Allergies  Allergen Reactions  . Sulfa Antibiotics Anaphylaxis and Swelling  . Topiramate Swelling    Tongue  . Duloxetine Hcl Other (See Comments)    Weight gain, feels "crazy"  . Penicillins Rash    Has patient had a PCN reaction causing immediate rash, facial/tongue/throat swelling, SOB or lightheadedness with hypotension: Yes Has patient had a PCN reaction causing severe rash involving mucus membranes or skin necrosis: No Has patient had a PCN reaction that required hospitalization No Has patient had a PCN reaction occurring within the last 10 years: No If all of the above answers are "NO", then may proceed with Cephalosporin use.     ROS:  See above HPI for  pertinent positives and negatives   Objective:   Temperature (!) 96 F (35.6 C), height 5' 8" (1.727 m), weight 211 lb (95.7 kg), last menstrual period 08/05/2014.  (if some vitals are omitted, this means that patient was UNABLE to obtain them even though they were asked to get them prior to OV today.  They were asked to call us [place] their earliest convenience with these once obtained. ) General: A & O * 3; sounds in no acute distress; in usual state of health.  Skin: Pt confirms warm and dry extremities and pink fingertips HEENT: Pt confirms lips non-cyanotic Chest: Patient confirms normal chest excursion and movement Respiratory: speaking in full sentences, no conversational dyspnea; patient confirms no use of accessory muscles Psych: insight appears good, mood- appears full

## 2019-05-06 ENCOUNTER — Ambulatory Visit: Payer: PPO | Admitting: Allergy

## 2019-05-12 ENCOUNTER — Emergency Department
Admission: EM | Admit: 2019-05-12 | Discharge: 2019-05-12 | Disposition: A | Discharge status: Home / Self Care | Payer: PPO | Attending: Emergency Medicine | Admitting: Emergency Medicine

## 2019-05-12 ENCOUNTER — Emergency Department: Payer: PPO

## 2019-05-12 ENCOUNTER — Encounter: Payer: Self-pay | Admitting: Intensive Care

## 2019-05-12 ENCOUNTER — Other Ambulatory Visit: Payer: Self-pay

## 2019-05-12 DIAGNOSIS — E119 Type 2 diabetes mellitus without complications: Secondary | ICD-10-CM | POA: Diagnosis not present

## 2019-05-12 DIAGNOSIS — N23 Unspecified renal colic: Secondary | ICD-10-CM

## 2019-05-12 DIAGNOSIS — N132 Hydronephrosis with renal and ureteral calculous obstruction: Secondary | ICD-10-CM | POA: Diagnosis not present

## 2019-05-12 DIAGNOSIS — R1032 Left lower quadrant pain: Secondary | ICD-10-CM | POA: Diagnosis present

## 2019-05-12 DIAGNOSIS — Z79899 Other long term (current) drug therapy: Secondary | ICD-10-CM | POA: Insufficient documentation

## 2019-05-12 LAB — COMPREHENSIVE METABOLIC PANEL
ALT: 27 U/L (ref 0–44)
AST: 28 U/L (ref 15–41)
Albumin: 4.3 g/dL (ref 3.5–5.0)
Alkaline Phosphatase: 79 U/L (ref 38–126)
Anion gap: 11 (ref 5–15)
BUN: 14 mg/dL (ref 6–20)
CO2: 28 mmol/L (ref 22–32)
Calcium: 9.1 mg/dL (ref 8.9–10.3)
Chloride: 99 mmol/L (ref 98–111)
Creatinine, Ser: 0.89 mg/dL (ref 0.44–1.00)
GFR calc Af Amer: >60 mL/min (ref >60)
GFR calc non Af Amer: >60 mL/min (ref >60)
Glucose, Bld: 158 mg/dL — ABNORMAL HIGH (ref 70–99)
Potassium: 3.7 mmol/L (ref 3.5–5.1)
Sodium: 138 mmol/L (ref 135–145)
Total Bilirubin: 0.4 mg/dL (ref 0.3–1.2)
Total Protein: 8.1 g/dL (ref 6.5–8.1)

## 2019-05-12 LAB — URINALYSIS, COMPLETE (UACMP) WITH MICROSCOPIC
Bilirubin Urine: NEGATIVE
Glucose, UA: NEGATIVE mg/dL
Ketones, ur: NEGATIVE mg/dL
Nitrite: NEGATIVE
Protein, ur: 30 mg/dL — AB
RBC / HPF: >50 RBC/hpf — ABNORMAL HIGH (ref 0–5)
Specific Gravity, Urine: 1.018 (ref 1.005–1.030)
pH: 6 (ref 5.0–8.0)

## 2019-05-12 LAB — CBC
HCT: 42.2 % (ref 36.0–46.0)
Hemoglobin: 13.5 g/dL (ref 12.0–15.0)
MCH: 27.2 pg (ref 26.0–34.0)
MCHC: 32 g/dL (ref 30.0–36.0)
MCV: 84.9 fL (ref 80.0–100.0)
Platelets: 222 10*3/uL (ref 150–400)
RBC: 4.97 MIL/uL (ref 3.87–5.11)
RDW: 12.9 % (ref 11.5–15.5)
WBC: 10 10*3/uL (ref 4.0–10.5)
nRBC: 0 % (ref 0.0–0.2)

## 2019-05-12 LAB — GLUCOSE, CAPILLARY: Glucose-Capillary: 148 mg/dL — ABNORMAL HIGH (ref 70–99)

## 2019-05-12 LAB — LIPASE, BLOOD: Lipase: 25 U/L (ref 11–51)

## 2019-05-12 MED ORDER — ACETAMINOPHEN 325 MG PO TABS
650.0000 mg | ORAL_TABLET | Freq: Once | ORAL | Status: AC
  Administered 2019-05-12: 650 mg via ORAL
  Filled 2019-05-12: qty 2

## 2019-05-12 MED ORDER — ONDANSETRON 4 MG PO TBDP: 4.0000 mg | ORAL_TABLET | Freq: Three times a day (TID) | ORAL | 0 refills | Status: DC | PRN

## 2019-05-12 MED ORDER — ONDANSETRON 4 MG PO TBDP
4.0000 mg | ORAL_TABLET | Freq: Once | ORAL | Status: AC | PRN
  Administered 2019-05-12: 4 mg via ORAL
  Filled 2019-05-12: qty 1

## 2019-05-12 MED ORDER — OXYCODONE-ACETAMINOPHEN 5-325 MG PO TABS: 1.0000 | ORAL_TABLET | Freq: Four times a day (QID) | ORAL | 0 refills | Status: DC | PRN

## 2019-05-12 MED ORDER — OXYCODONE-ACETAMINOPHEN 5-325 MG PO TABS
1.0000 | ORAL_TABLET | Freq: Once | ORAL | Status: AC
  Administered 2019-05-12: 1 via ORAL
  Filled 2019-05-12: qty 1

## 2019-05-12 NOTE — ED Triage Notes (Signed)
Patient c/o left sided flank pain with N/V. Actively vomiting in triage. Hx interstitial cystitis.

## 2019-05-12 NOTE — ED Provider Notes (Signed)
[facility] Emergency Department Provider Note  ____________________________________________  Time seen: Approximately 5:53 PM  I have reviewed the triage vital signs and the nursing notes.   HISTORY  Chief Complaint Flank Pain (left)    HPI Samantha [name] is a 38 y.o. female with a history of anxiety depression fibromyalgia diabetes  who complains of left lower quadrant abdominal pain radiating to the left flank that started 2 days ago, gradually, worsening over the past 2 days.  Constant, waxing and waning, no aggravating or alleviating factors.  Currently severe, associated with nausea and vomiting which is better after receiving Zofran here in the ED.     Past Medical History:  Diagnosis Date  . Anemia   . Angio-edema   . Anxiety   . Depression    rx presc but not taken  . Diabetes mellitus without complication ([place])   . Fibromyalgia   . GERD (gastroesophageal reflux disease)    no meds  . Headache(784.0)    tension and with anxiety hx  . Hepatic steatosis   . History of kidney stones   . MVP (mitral valve prolapse)    echo 10  . Pneumonia    hx  . Seizures ([place])    non epileptic events per epilepsy monitoring unit  . Seizures ([place])    last seizure July 2015- does not convulse but has a sleep effect  . Sinus infection    recent on antibiotic  . Sleep apnea    has cpap does not use     Patient Active Problem List   Diagnosis Date Noted  . Irritability and anger 03/06/2019  . Heartburn 03/04/2019  . Chronic rhinitis 03/04/2019  . Adverse food reaction 03/04/2019  . Globus sensation 02/28/2019  . Finger pain, left 01/01/2019  . Allergy history unknown- possible throat closure sensation to certain foods 76/28/3151  . Neuropathic pain 09/04/2018  . Chronic radicular pain of lower back 09/04/2018  . Lumbar foraminal stenosis-L5-S1 09/04/2018  . Osteoarthritis of spine with radiculopathy, lumbar region 09/04/2018  . Obesity, Class I,  BMI 30-34.9 09/04/2018  . Piriformis syndrome, left 07/12/2018  . Single episode of elevated blood pressure 07/12/2018  . Hypertriglyceridemia 04/11/2018  . Low level of high density lipoprotein (HDL) 04/11/2018  . Vitamin D insufficiency 04/11/2018  . Epigastric abdominal pain with N 04/06/2018  . History of vitamin D deficiency 04/06/2018  . Spondylosis without myelopathy or radiculopathy, lumbar region 10/03/2017  . Acute folliculitis- likely due to MRSA, they are draining 06/19/2017  . Personal history of MRSA (methicillin resistant Staphylococcus aureus) 06/19/2017  . Chronic diarrhea  06/19/2017  . Contact dermatitis and eczema- due to tape\ Band-Aid 06/19/2017  . Abdominal pain, LUQ 05/02/2017  . Abdominal pain, LLQ 05/02/2017  . Hx of diarrhea 05/02/2017  . Environmental and seasonal allergies 09/29/2016  . GERD (gastroesophageal reflux disease) 09/29/2016  . Bipolar 1 disorder - Anxiety 09/28/2016  . Fibromyalgia 08/11/2016  . Type 2 diabetes mellitus with complication, without long-term current use of insulin ([place]) 12/29/2015  . OSA on CPAP 12/08/2014  . Obstructive sleep apnea syndrome, moderate 11/24/2014  . Fatty liver 09/05/2014  . S/P abdominal hysterectomy 08/15/2014  . Bladder dysfunction 04/30/2014  . Nephrolithiasis 03/04/2014  . Agenesis, corpus callosum ([place]) 02/17/2014  . GAD (generalized anxiety disorder) 02/17/2014  . History of blood transfusion 02/17/2014  . Obesity (BMI 30-39.9) 02/17/2014     Past Surgical History:  Procedure Laterality Date  . ABDOMINAL HYSTERECTOMY N/A 08/15/2014  Procedure: HYSTERECTOMY ABDOMINAL WITH CYSTOSCOPY;  Surgeon: Sanjuana Kava, MD;  Location: [facility];  Service: Gynecology;  Laterality: N/A;  . CESAREAN SECTION N/A   . SHOULDER ARTHROSCOPY WITH SUBACROMIAL DECOMPRESSION AND OPEN ROTATOR C Left 11/27/2015   Procedure: LEFT SHOULDER ARTHROSCOPY WITH SUBACROMIAL DECOMPRESSION, MINI OPEN ROTATOR CUFF REPAIR;  Surgeon: Netta Cedars, MD;  Location: [place];  Service: Orthopedics;  Laterality: Left;  . TUBAL LIGATION       Prior to Admission medications   Medication Sig Start Date End Date Taking? Authorizing Provider  amitriptyline (ELAVIL) 10 MG tablet Take 1 tablet by mouth daily. 03/21/19   [provider]  blood glucose meter kit and supplies Dispense based on patient and insurance preference. Use to check blood glucose levels fasting in the morning and 2 hours after largest meal daily.  (FOR ICD-10 E10.9, E11.9). 04/06/18   Opalski, Neoma Laming, DO  famotidine (PEPCID) 20 MG tablet Take 20 mg by mouth 2 (two) times daily.    [provider]  FLUoxetine (PROZAC) 20 MG tablet 1 tablet daily 04/17/19   Opalski, Neoma Laming, DO  liraglutide (VICTOZA) 18 MG/3ML SOPN 1.'2mg'$  [place] qd 04/17/19   Opalski, Neoma Laming, DO  pregabalin (LYRICA) 75 MG capsule Take 75 mg by mouth 2 (two) times daily.    [provider]  valACYclovir (VALTREX) 500 MG tablet valacyclovir 500 mg tablet    [provider]     Allergies Sulfa antibiotics; Topiramate; Duloxetine hcl; Metformin and related; and Penicillins   Family History  Problem Relation Age of Onset  . Diabetes Mother   . Hypertension Mother   . Healthy Father   . Breast cancer Maternal Grandmother   . Cancer Maternal Grandfather        brain, lung, liver  . Heart attack Paternal Grandfather     Social History Social History   Tobacco Use  . Smoking status: Never Smoker  . Smokeless tobacco: Never Used  Substance Use Topics  . Alcohol use: No  . Drug use: No    Review of Systems  Constitutional:   No fever or chills.  ENT:   No sore throat. No rhinorrhea. Cardiovascular:   No chest pain or syncope. Respiratory:   No dyspnea or cough. Gastrointestinal:   Positive abdominal pain as above with vomiting.  No constipation..  Musculoskeletal:   Negative for focal pain or swelling All other systems reviewed and are negative except as  documented above in ROS and HPI.  ____________________________________________   PHYSICAL EXAM:  VITAL SIGNS: ED Triage Vitals  Enc Vitals Group     BP 05/12/19 1533 129/78     Pulse Rate 05/12/19 1533 70     Resp 05/12/19 1533 17     Temp 05/12/19 1533 98.4 F (36.9 C)     Temp Source 05/12/19 1533 Oral     SpO2 05/12/19 1533 97 %     Weight 05/12/19 1523 211 lb (95.7 kg)     Height 05/12/19 1523 '5\' 9"'$  (1.753 m)     Head Circumference --      Peak Flow --      Pain Score 05/12/19 1523 10     Pain Loc --      Pain Edu? --      Excl. in [place]? --     Vital signs reviewed, nursing assessments reviewed.   Constitutional:   Alert and oriented. Non-toxic appearance. Eyes:   Conjunctivae are normal. EOMI. PERRL. ENT  Head:   Normocephalic and atraumatic.      Nose:   No congestion/rhinnorhea.       Mouth/Throat:   MMM, no pharyngeal erythema. No peritonsillar mass.       Neck:   No meningismus. Full ROM. Hematological/Lymphatic/Immunilogical:   No cervical lymphadenopathy. Cardiovascular:   RRR. Symmetric bilateral radial and DP pulses.  No murmurs. Cap refill less than 2 seconds. Respiratory:   Normal respiratory effort without tachypnea/retractions. Breath sounds are clear and equal bilaterally. No wheezes/rales/rhonchi. Gastrointestinal:   Soft with mild left lower quadrant tenderness. Non distended. There is no CVA tenderness.  No rebound, rigidity, or guarding.  Musculoskeletal:   Normal range of motion in all extremities. No joint effusions.  No lower extremity tenderness.  No edema. Neurologic:   Normal speech and language.  Motor grossly intact. No acute focal neurologic deficits are appreciated.  Skin:    Skin is warm, dry and intact. No rash noted.  No petechiae, purpura, or bullae.  ____________________________________________    LABS (pertinent positives/negatives) (all labs ordered are listed, but only abnormal results are displayed) Labs Reviewed   COMPREHENSIVE METABOLIC PANEL - Abnormal; Notable for the following components:      Result Value   Glucose, Bld 158 (*)    All other components within normal limits  URINALYSIS, COMPLETE (UACMP) WITH MICROSCOPIC - Abnormal; Notable for the following components:   Color, Urine YELLOW (*)    APPearance CLOUDY (*)    Hgb urine dipstick LARGE (*)    Protein, ur 30 (*)    Leukocytes,Ua MODERATE (*)    RBC / HPF >50 (*)    Bacteria, UA RARE (*)    All other components within normal limits  GLUCOSE, CAPILLARY - Abnormal; Notable for the following components:   Glucose-Capillary 148 (*)    All other components within normal limits  URINE CULTURE  LIPASE, BLOOD  CBC   ____________________________________________   EKG    ____________________________________________    RADIOLOGY  Ct Renal Stone Study  Result Date: 05/12/2019 CLINICAL DATA:  Left-sided flank pain, nausea, vomiting EXAM: CT ABDOMEN AND PELVIS WITHOUT CONTRAST TECHNIQUE: Multidetector CT imaging of the abdomen and pelvis was performed following the standard protocol without IV contrast. COMPARISON:  05/24/2017 FINDINGS: Lower chest: No acute abnormality. Hepatobiliary: No solid liver abnormality is seen. Hepatomegaly and hepatic steatosis. No gallstones, gallbladder wall thickening, or biliary dilatation. Pancreas: Unremarkable. No pancreatic ductal dilatation or surrounding inflammatory changes. Spleen: Normal in size without significant abnormality. Adrenals/Urinary Tract: Adrenal glands are unremarkable. There is a 5 mm calculus at the left ureterovesicular junction with mild associated left hydronephrosis and hydroureter. There are multiple small additional nonobstructive calculi present bilaterally. Bladder is unremarkable. Stomach/Bowel: Stomach is within normal limits. Appendix appears normal. No evidence of bowel wall thickening, distention, or inflammatory changes. Vascular/Lymphatic: No significant vascular findings  are present. No enlarged abdominal or pelvic lymph nodes. Reproductive: Status post hysterectomy. Other: No abdominal wall hernia or abnormality. No abdominopelvic ascites. Musculoskeletal: No acute or significant osseous findings. IMPRESSION: There is a 5 mm calculus at the left ureterovesicular junction with mild associated left hydronephrosis and hydroureter. There are multiple small additional nonobstructive calculi present bilaterally. Electronically Signed   By: Eddie Candle M.D.   On: 05/12/2019 17:33    ____________________________________________   PROCEDURES Procedures  ____________________________________________  DIFFERENTIAL DIAGNOSIS   Ureterolithiasis, ovarian cyst, diverticulitis, cystitis  CLINICAL IMPRESSION / ASSESSMENT AND PLAN / ED COURSE  Medications ordered in the ED: Medications  ondansetron (  ZOFRAN-ODT) disintegrating tablet 4 mg (4 mg Oral Given 05/12/19 1527)  oxyCODONE-acetaminophen (PERCOCET/ROXICET) 5-325 MG per tablet 1 tablet (1 tablet Oral Given 05/12/19 1707)  acetaminophen (TYLENOL) tablet 650 mg (650 mg Oral Given 05/12/19 1707)    Pertinent labs & imaging results that were available during my care of the patient were reviewed by me and considered in my medical decision making (see chart for details).  Samantha [name] was evaluated in Emergency Department on 05/12/2019 for the symptoms described in the history of present illness. She was evaluated in the context of the global COVID-19 pandemic, which necessitated consideration that the patient might be at risk for infection with the SARS-CoV-2 virus that causes COVID-19. Institutional protocols and algorithms that pertain to the evaluation of patients at risk for COVID-19 are in a state of rapid change based on information released by regulatory bodies including the CDC and federal and state organizations. These policies and algorithms were followed during the patient's care in the ED.   Patient presents with  left lower quadrant and left flank pain, similar to prior episodes of renal colic.  Most likely ureterolithiasis.  Labs are normal, urinalysis shows hematuria without frank signs of urinary tract infection.  I will obtain a CT scan to further evaluate.  Clinical Course as of May 11 1752  Sun May 12, 2019  1748 CT scan reveals a 5 mm stone in the left ureter at the left UVJ with mild hydronephrosis.  Urinalysis is not consistent with infection though with small amount of white blood cells I will start the patient on antibiotics empirically prior to urine culture result.  White blood cell count is normal.  No fever.  Creatinine is normal.  Stable for outpatient follow-up for ureteral colic.   [PS]    Clinical Course User Index [PS] Carrie Mew, MD     ____________________________________________   FINAL CLINICAL IMPRESSION(S) / ED DIAGNOSES    Final diagnoses:  Ureteral colic     ED Discharge Orders    None      Portions of this note were generated with dragon dictation software. Dictation errors may occur despite best attempts at proofreading.   Carrie Mew, MD 05/12/19 1755

## 2019-05-12 NOTE — ED Notes (Signed)
First Nurse Note: Pt refuses to wear facemask. States that it makes her hyperventilate

## 2019-05-12 NOTE — ED Notes (Signed)
Patient transported to CT 

## 2019-05-12 NOTE — ED Notes (Signed)
Pt inquiring about wait time. Pt informed that there is 2 people ahead of her if nothing more emergent comes in

## 2019-05-12 NOTE — ED Notes (Signed)
Pt able to ambulate to restroom without assistance.  

## 2019-05-12 NOTE — ED Notes (Signed)
Checked CBG- 148

## 2019-05-14 LAB — URINE CULTURE

## 2019-05-20 ENCOUNTER — Ambulatory Visit (INDEPENDENT_AMBULATORY_CARE_PROVIDER_SITE_OTHER): Payer: PPO | Admitting: Urology

## 2019-05-20 ENCOUNTER — Other Ambulatory Visit
Admission: RE | Admit: 2019-05-20 | Discharge: 2019-05-20 | Disposition: A | Discharge status: Home / Self Care | Payer: PPO | Source: Ambulatory Visit | Attending: Urology | Admitting: Urology

## 2019-05-20 ENCOUNTER — Other Ambulatory Visit: Payer: Self-pay

## 2019-05-20 ENCOUNTER — Encounter: Payer: Self-pay | Admitting: Urology

## 2019-05-20 VITALS — BP 126/75 | HR 75 | Ht 69.0 in | Wt 209.8 lb

## 2019-05-20 DIAGNOSIS — N132 Hydronephrosis with renal and ureteral calculous obstruction: Secondary | ICD-10-CM

## 2019-05-20 DIAGNOSIS — Z1159 Encounter for screening for other viral diseases: Secondary | ICD-10-CM | POA: Diagnosis not present

## 2019-05-20 DIAGNOSIS — N200 Calculus of kidney: Secondary | ICD-10-CM

## 2019-05-20 DIAGNOSIS — N23 Unspecified renal colic: Secondary | ICD-10-CM

## 2019-05-20 DIAGNOSIS — N201 Calculus of ureter: Secondary | ICD-10-CM

## 2019-05-20 NOTE — H&P (View-Only) (Signed)
 05/20/2019 2:23 PM   [name] 09/22/1981 [id number]  Referring provider: Opalski, Deborah, DO [address] [place],  [place] [address]  Chief Complaint  Patient presents with  . Nephrolithiasis    HPI: Samantha [name] is a 38-year-old female who presented to the [facility] ED on 05/12/2019 with a 2-day history of gradually worsening left flank pain radiating to the left lower quadrant.  The pain was intermittent without identifiable precipitating, aggravating or alleviating factors.  Intensity was severe at the time of her ED visit.  She had nausea and vomiting.  A stone protocol CT of the abdomen pelvis were performed which showed a 5 mm left UVJ calculus with mild hydronephrosis/hydroureter.  There were small, nonobstructing bilateral renal calculi present.  Past urologic history remarkable for prior stone disease status post ureteroscopic stone extraction approximately 12 years ago.  She also has a history of interstitial cystitis.  Since her ED visit she has had intermittent pain which has not been as severe as her ED visit.  She was not started on tamsulosin due to history of sulfa allergy with anaphylaxis.  PMH: Past Medical History:  Diagnosis Date  . Anemia   . Angio-edema   . Anxiety   . Depression    rx presc but not taken  . Diabetes mellitus without complication ([facility])   . Fibromyalgia   . GERD (gastroesophageal reflux disease)    no meds  . Headache(784.0)    tension and with anxiety hx  . Hepatic steatosis   . History of kidney stones   . MVP (mitral valve prolapse)    echo 10  . Pneumonia    hx  . Seizures ([facility])    non epileptic events per epilepsy monitoring unit  . Seizures ([facility])    last seizure July 2015- does not convulse but has a sleep effect  . Sinus infection    recent on antibiotic  . Sleep apnea    has cpap does not use    Surgical History: Past Surgical History:  Procedure Laterality Date  . ABDOMINAL HYSTERECTOMY N/A 08/15/2014   Procedure: HYSTERECTOMY ABDOMINAL WITH CYSTOSCOPY;  Surgeon: Walda Pinn, MD;  Location: [facility];  Service: Gynecology;  Laterality: N/A;  . CESAREAN SECTION N/A   . SHOULDER ARTHROSCOPY WITH SUBACROMIAL DECOMPRESSION AND OPEN ROTATOR C Left 11/27/2015   Procedure: LEFT SHOULDER ARTHROSCOPY WITH SUBACROMIAL DECOMPRESSION, MINI OPEN ROTATOR CUFF REPAIR;  Surgeon: Steve Norris, MD;  Location: [facility];  Service: Orthopedics;  Laterality: Left;  . TUBAL LIGATION      Home Medications:  Allergies as of 05/20/2019      Reactions   Sulfa Antibiotics Anaphylaxis, Swelling   Topiramate Swelling   Tongue   Duloxetine Hcl Other (See Comments)   Weight gain, feels "crazy"   Ibuprofen    Due to taking Elavil, causes counter headaches when she takes Ibuprofen   Metformin And Related Other (See Comments)   GI Upset   Penicillins Rash   Has patient had a PCN reaction causing immediate rash, facial/tongue/throat swelling, SOB or lightheadedness with hypotension: Yes Has patient had a PCN reaction causing severe rash involving mucus membranes or skin necrosis: No Has patient had a PCN reaction that required hospitalization No Has patient had a PCN reaction occurring within the last 10 years: No If all of the above answers are "NO", then may proceed with Cephalosporin use.      Medication List       Accurate as of May 20, 2019  2:23 PM. If you have any questions, ask your nurse or doctor.        amitriptyline 10 MG tablet Commonly known as: ELAVIL Take 20 mg by mouth at bedtime.   blood glucose meter kit and supplies Dispense based on patient and insurance preference. Use to check blood glucose levels fasting in the morning and 2 hours after largest meal daily.  (FOR ICD-10 E10.9, E11.9).   FLUoxetine 20 MG tablet Commonly known as: PROZAC 1 tablet daily What changed:   how much to take  how to take this  when to take this  additional instructions   liraglutide 18 MG/3ML Sopn Commonly  known as: VICTOZA 1.2mg [place] qd What changed:   how much to take  how to take this  when to take this  additional instructions   ondansetron 4 MG disintegrating tablet Commonly known as: Zofran ODT Take 1 tablet (4 mg total) by mouth every 8 (eight) hours as needed for nausea or vomiting.   oxyCODONE-acetaminophen 5-325 MG tablet Commonly known as: Percocet Take 1 tablet by mouth every 6 (six) hours as needed for severe pain.   pregabalin 75 MG capsule Commonly known as: LYRICA Take 75 mg by mouth 2 (two) times daily.   Turmeric 500 MG Tabs Take 500 mg by mouth daily.   valACYclovir 500 MG tablet Commonly known as: VALTREX Take 500 mg by mouth daily as needed (cold sores).       Allergies:  Allergies  Allergen Reactions  . Sulfa Antibiotics Anaphylaxis and Swelling  . Topiramate Swelling    Tongue  . Duloxetine Hcl Other (See Comments)    Weight gain, feels "crazy"  . Ibuprofen     Due to taking Elavil, causes counter headaches when she takes Ibuprofen  . Metformin And Related Other (See Comments)    GI Upset  . Penicillins Rash    Has patient had a PCN reaction causing immediate rash, facial/tongue/throat swelling, SOB or lightheadedness with hypotension: Yes Has patient had a PCN reaction causing severe rash involving mucus membranes or skin necrosis: No Has patient had a PCN reaction that required hospitalization No Has patient had a PCN reaction occurring within the last 10 years: No If all of the above answers are "NO", then may proceed with Cephalosporin use.     Family History: Family History  Problem Relation Age of Onset  . Diabetes Mother   . Hypertension Mother   . Healthy Father   . Breast cancer Maternal Grandmother   . Cancer Maternal Grandfather        brain, lung, liver  . Heart attack Paternal Grandfather     Social History:  reports that she has never smoked. She has never used smokeless tobacco. She reports that she does not drink  alcohol or use drugs.  ROS: UROLOGY Frequent Urination?: No Hard to postpone urination?: No Burning/pain with urination?: No Get up at night to urinate?: No Leakage of urine?: No Urine stream starts and stops?: No Trouble starting stream?: No Do you have to strain to urinate?: No Blood in urine?: No Urinary tract infection?: No Sexually transmitted disease?: No Injury to kidneys or bladder?: No Painful intercourse?: No Weak stream?: No Currently pregnant?: No Vaginal bleeding?: No Last menstrual period?: Hysterectomy  Gastrointestinal Nausea?: No Vomiting?: No Indigestion/heartburn?: No Diarrhea?: No Constipation?: No  Constitutional Fever: No Night sweats?: No Weight loss?: Yes Fatigue?: Yes  Skin Skin rash/lesions?: No Itching?: No  Eyes Blurred vision?: No Double vision?:   No  Ears/Nose/Throat Sore throat?: No Sinus problems?: No  Hematologic/Lymphatic Swollen glands?: No Easy bruising?: Yes  Cardiovascular Leg swelling?: No Chest pain?: No  Respiratory Cough?: No Shortness of breath?: No  Endocrine Excessive thirst?: No  Musculoskeletal Back pain?: Yes Joint pain?: Yes  Neurological Headaches?: Yes Dizziness?: No  Psychologic Depression?: No Anxiety?: Yes  Physical Exam: BP 126/75 (BP Location: Left Arm, Patient Position: Sitting, Cuff Size: Normal)   Pulse 75   Ht 5' 9" (1.753 m)   Wt 209 lb 12.8 oz (95.2 kg)   LMP 08/05/2014 (Approximate)   BMI 30.98 kg/m   Constitutional:  Alert and oriented, No acute distress. HEENT: [place] AT, moist mucus membranes.  Trachea midline, no masses. Cardiovascular: No clubbing, cyanosis, or edema.  RRR Respiratory: Normal respiratory effort, no increased work of breathing.  Clear GI: Abdomen is soft, nontender, nondistended, no abdominal masses GU: No CVA tenderness Lymph: No cervical or inguinal lymphadenopathy. Skin: No rashes, bruises or suspicious lesions. Neurologic: Grossly intact, no  focal deficits, moving all 4 extremities. Psychiatric: Normal mood and affect.   Assessment & Plan:   37-year-old female with a 5 mm left distal ureteral calculus and intermittent renal colic.  We discussed various treatment options for urolithiasis including observation with or without medical expulsive therapy, shockwave lithotripsy (SWL), ureteroscopy and laser lithotripsy with stent placement.  We discussed that management is based on stone size, location, density, patient co-morbidities, and patient preference.   Stones <5mm in size have a >80% spontaneous passage rate. Data surrounding the use of tamsulosin for medical expulsive therapy is controversial, but meta analyses suggests it is most efficacious for distal stones between 5-10mm in size. Possible side effects include dizziness/lightheadedness, and retrograde ejaculation.  SWL has a lower stone free rate in a single procedure, but also a lower complication rate compared to ureteroscopy and avoids a stent and associated stent related symptoms. Possible complications include renal hematoma, steinstrasse, and need for additional treatment.  Ureteroscopy with laser lithotripsy and stent placement has a higher stone free rate than SWL in a single procedure, however increased complication rate including possible infection, ureteral injury, bleeding, and stent related morbidity. Common stent related symptoms include dysuria, urgency/frequency, and flank pain.  After an extensive discussion of the risks and benefits of the above treatment options, the patient would like to proceed with left ureteroscopy with laser lithotripsy/stone removal.  The indications and nature of the planned procedure were discussed as well as the potential  benefits and expected outcome.  Alternatives have been discussed in detail. The most common complications and side effects were discussed including but not limited to infection/sepsis; blood loss; damage to  urethra, bladder, ureter; need for multiple surgeries; need for prolonged stent placement as well as general anesthesia risks. All of her questions were answered and she desires to proceed.     Shawnice Tilmon C Yurika Pereda, MD  [facility] [address], [address] [place], [place] [address] [phone]  

## 2019-05-20 NOTE — Progress Notes (Signed)
05/20/2019 2:23 PM   Samantha [name] [birth year] [id number]  Referring provider: [name], [name],  [place] [address]  Chief Complaint  Patient presents with  . Nephrolithiasis    HPI: Samantha [name] is a 38 year old female who presented to the [facility] ED on 05/12/2019 with a 2-day history of gradually worsening left flank pain radiating to the left lower quadrant.  The pain was intermittent without identifiable precipitating, aggravating or alleviating factors.  Intensity was severe at the time of her ED visit.  She had nausea and vomiting.  A stone protocol CT of the abdomen pelvis were performed which showed a 5 mm left UVJ calculus with mild hydronephrosis/hydroureter.  There were small, nonobstructing bilateral renal calculi present.  Past urologic history remarkable for prior stone disease status post ureteroscopic stone extraction approximately 12 years ago.  She also has a history of interstitial cystitis.  Since her ED visit she has had intermittent pain which has not been as severe as her ED visit.  She was not started on tamsulosin due to history of sulfa allergy with anaphylaxis.  PMH: Past Medical History:  Diagnosis Date  . Anemia   . Angio-edema   . Anxiety   . Depression    rx presc but not taken  . Diabetes mellitus without complication ([place])   . Fibromyalgia   . GERD (gastroesophageal reflux disease)    no meds  . Headache(784.0)    tension and with anxiety hx  . Hepatic steatosis   . History of kidney stones   . MVP (mitral valve prolapse)    echo 10  . Pneumonia    hx  . Seizures ([place])    non epileptic events per epilepsy monitoring unit  . Seizures ([place])    last seizure July 2015- does not convulse but has a sleep effect  . Sinus infection    recent on antibiotic  . Sleep apnea    has cpap does not use    Surgical History: Past Surgical History:  Procedure Laterality Date  . ABDOMINAL HYSTERECTOMY N/A 08/15/2014   Procedure: HYSTERECTOMY ABDOMINAL WITH CYSTOSCOPY;  Surgeon: Sanjuana Kava, MD;  Location: [facility];  Service: Gynecology;  Laterality: N/A;  . CESAREAN SECTION N/A   . SHOULDER ARTHROSCOPY WITH SUBACROMIAL DECOMPRESSION AND OPEN ROTATOR C Left 11/27/2015   Procedure: LEFT SHOULDER ARTHROSCOPY WITH SUBACROMIAL DECOMPRESSION, MINI OPEN ROTATOR CUFF REPAIR;  Surgeon: Netta Cedars, MD;  Location: [facility];  Service: Orthopedics;  Laterality: Left;  . TUBAL LIGATION      Home Medications:  Allergies as of 05/20/2019      Reactions   Sulfa Antibiotics Anaphylaxis, Swelling   Topiramate Swelling   Tongue   Duloxetine Hcl Other (See Comments)   Weight gain, feels "crazy"   Ibuprofen    Due to taking Elavil, causes counter headaches when she takes Ibuprofen   Metformin And Related Other (See Comments)   GI Upset   Penicillins Rash   Has patient had a PCN reaction causing immediate rash, facial/tongue/throat swelling, SOB or lightheadedness with hypotension: Yes Has patient had a PCN reaction causing severe rash involving mucus membranes or skin necrosis: No Has patient had a PCN reaction that required hospitalization No Has patient had a PCN reaction occurring within the last 10 years: No If all of the above answers are "NO", then may proceed with Cephalosporin use.      Medication List       Accurate as of May 20, 2019  2:23 PM. If you have any questions, ask your nurse or doctor.        amitriptyline 10 MG tablet Commonly known as: ELAVIL Take 20 mg by mouth at bedtime.   blood glucose meter kit and supplies Dispense based on patient and insurance preference. Use to check blood glucose levels fasting in the morning and 2 hours after largest meal daily.  (FOR ICD-10 E10.9, E11.9).   FLUoxetine 20 MG tablet Commonly known as: PROZAC 1 tablet daily What changed:   how much to take  how to take this  when to take this  additional instructions   liraglutide 18 MG/3ML Sopn Commonly  known as: VICTOZA 1.77m Pax qd What changed:   how much to take  how to take this  when to take this  additional instructions   ondansetron 4 MG disintegrating tablet Commonly known as: Zofran ODT Take 1 tablet (4 mg total) by mouth every 8 (eight) hours as needed for nausea or vomiting.   oxyCODONE-acetaminophen 5-325 MG tablet Commonly known as: Percocet Take 1 tablet by mouth every 6 (six) hours as needed for severe pain.   pregabalin 75 MG capsule Commonly known as: LYRICA Take 75 mg by mouth 2 (two) times daily.   Turmeric 500 MG Tabs Take 500 mg by mouth daily.   valACYclovir 500 MG tablet Commonly known as: VALTREX Take 500 mg by mouth daily as needed (cold sores).       Allergies:  Allergies  Allergen Reactions  . Sulfa Antibiotics Anaphylaxis and Swelling  . Topiramate Swelling    Tongue  . Duloxetine Hcl Other (See Comments)    Weight gain, feels "crazy"  . Ibuprofen     Due to taking Elavil, causes counter headaches when she takes Ibuprofen  . Metformin And Related Other (See Comments)    GI Upset  . Penicillins Rash    Has patient had a PCN reaction causing immediate rash, facial/tongue/throat swelling, SOB or lightheadedness with hypotension: Yes Has patient had a PCN reaction causing severe rash involving mucus membranes or skin necrosis: No Has patient had a PCN reaction that required hospitalization No Has patient had a PCN reaction occurring within the last 10 years: No If all of the above answers are "NO", then may proceed with Cephalosporin use.     Family History: Family History  Problem Relation Age of Onset  . Diabetes Mother   . Hypertension Mother   . Healthy Father   . Breast cancer Maternal Grandmother   . Cancer Maternal Grandfather        brain, lung, liver  . Heart attack Paternal Grandfather     Social History:  reports that she has never smoked. She has never used smokeless tobacco. She reports that she does not drink  alcohol or use drugs.  ROS: UROLOGY Frequent Urination?: No Hard to postpone urination?: No Burning/pain with urination?: No Get up at night to urinate?: No Leakage of urine?: No Urine stream starts and stops?: No Trouble starting stream?: No Do you have to strain to urinate?: No Blood in urine?: No Urinary tract infection?: No Sexually transmitted disease?: No Injury to kidneys or bladder?: No Painful intercourse?: No Weak stream?: No Currently pregnant?: No Vaginal bleeding?: No Last menstrual period?: Hysterectomy  Gastrointestinal Nausea?: No Vomiting?: No Indigestion/heartburn?: No Diarrhea?: No Constipation?: No  Constitutional Fever: No Night sweats?: No Weight loss?: Yes Fatigue?: Yes  Skin Skin rash/lesions?: No Itching?: No  Eyes Blurred vision?: No Double vision?:  No  Ears/Nose/Throat Sore throat?: No Sinus problems?: No  Hematologic/Lymphatic Swollen glands?: No Easy bruising?: Yes  Cardiovascular Leg swelling?: No Chest pain?: No  Respiratory Cough?: No Shortness of breath?: No  Endocrine Excessive thirst?: No  Musculoskeletal Back pain?: Yes Joint pain?: Yes  Neurological Headaches?: Yes Dizziness?: No  Psychologic Depression?: No Anxiety?: Yes  Physical Exam: BP 126/75 (BP Location: Left Arm, Patient Position: Sitting, Cuff Size: Normal)   Pulse 75   Ht _0  (1.753 m)   Wt 209 lb 12.8 oz (95.2 kg)   LMP 08/05/2014 (Approximate)   BMI 30.98 kg/m   Constitutional:  Alert and oriented, No acute distress. HEENT: [place] AT, moist mucus membranes.  Trachea midline, no masses. Cardiovascular: No clubbing, cyanosis, or edema.  RRR Respiratory: Normal respiratory effort, no increased work of breathing.  Clear GI: Abdomen is soft, nontender, nondistended, no abdominal masses GU: No CVA tenderness Lymph: No cervical or inguinal lymphadenopathy. Skin: No rashes, bruises or suspicious lesions. Neurologic: Grossly intact, no  focal deficits, moving all 4 extremities. Psychiatric: Normal mood and affect.   Assessment & Plan:   38 year old female with a 5 mm left distal ureteral calculus and intermittent renal colic.  We discussed various treatment options for urolithiasis including observation with or without medical expulsive therapy, shockwave lithotripsy (SWL), ureteroscopy and laser lithotripsy with stent placement.  We discussed that management is based on stone size, location, density, patient co-morbidities, and patient preference.   Stones <47m in size have a >80% spontaneous passage rate. Data surrounding the use of tamsulosin for medical expulsive therapy is controversial, but meta analyses suggests it is most efficacious for distal stones between 5-155min size. Possible side effects include dizziness/lightheadedness, and retrograde ejaculation.  SWL has a lower stone free rate in a single procedure, but also a lower complication rate compared to ureteroscopy and avoids a stent and associated stent related symptoms. Possible complications include renal hematoma, steinstrasse, and need for additional treatment.  Ureteroscopy with laser lithotripsy and stent placement has a higher stone free rate than SWL in a single procedure, however increased complication rate including possible infection, ureteral injury, bleeding, and stent related morbidity. Common stent related symptoms include dysuria, urgency/frequency, and flank pain.  After an extensive discussion of the risks and benefits of the above treatment options, the patient would like to proceed with left ureteroscopy with laser lithotripsy/stone removal.  The indications and nature of the planned procedure were discussed as well as the potential  benefits and expected outcome.  Alternatives have been discussed in detail. The most common complications and side effects were discussed including but not limited to infection/sepsis; blood loss; damage to  urethra, bladder, ureter; need for multiple surgeries; need for prolonged stent placement as well as general anesthesia risks. All of her questions were answered and she desires to proceed.     ScAbbie SonsMDWest Carson[address]SuSt. LawrenceuNorth ShoreNC [phone]

## 2019-05-21 ENCOUNTER — Other Ambulatory Visit: Payer: Self-pay

## 2019-05-21 ENCOUNTER — Encounter
Admission: RE | Admit: 2019-05-21 | Discharge: 2019-05-21 | Disposition: A | Discharge status: Home / Self Care | Payer: PPO | Source: Ambulatory Visit | Attending: Urology | Admitting: Urology

## 2019-05-21 DIAGNOSIS — M797 Fibromyalgia: Secondary | ICD-10-CM | POA: Diagnosis not present

## 2019-05-21 DIAGNOSIS — Z7984 Long term (current) use of oral hypoglycemic drugs: Secondary | ICD-10-CM | POA: Diagnosis not present

## 2019-05-21 DIAGNOSIS — Z79899 Other long term (current) drug therapy: Secondary | ICD-10-CM | POA: Diagnosis not present

## 2019-05-21 DIAGNOSIS — K219 Gastro-esophageal reflux disease without esophagitis: Secondary | ICD-10-CM | POA: Diagnosis not present

## 2019-05-21 DIAGNOSIS — R109 Unspecified abdominal pain: Secondary | ICD-10-CM | POA: Diagnosis present

## 2019-05-21 DIAGNOSIS — R9431 Abnormal electrocardiogram [ECG] [EKG]: Secondary | ICD-10-CM | POA: Insufficient documentation

## 2019-05-21 DIAGNOSIS — N132 Hydronephrosis with renal and ureteral calculous obstruction: Secondary | ICD-10-CM | POA: Diagnosis not present

## 2019-05-21 DIAGNOSIS — Z01812 Encounter for preprocedural laboratory examination: Secondary | ICD-10-CM | POA: Insufficient documentation

## 2019-05-21 DIAGNOSIS — E119 Type 2 diabetes mellitus without complications: Secondary | ICD-10-CM | POA: Insufficient documentation

## 2019-05-21 DIAGNOSIS — F329 Major depressive disorder, single episode, unspecified: Secondary | ICD-10-CM | POA: Diagnosis not present

## 2019-05-21 DIAGNOSIS — G4730 Sleep apnea, unspecified: Secondary | ICD-10-CM | POA: Diagnosis not present

## 2019-05-21 DIAGNOSIS — R569 Unspecified convulsions: Secondary | ICD-10-CM | POA: Diagnosis not present

## 2019-05-21 HISTORY — DX: Other complications of anesthesia, initial encounter: T88.59XA

## 2019-05-21 LAB — NOVEL CORONAVIRUS, NAA (HOSP ORDER, SEND-OUT TO REF LAB; TAT 18-24 HRS): SARS-CoV-2, NAA: NOT DETECTED

## 2019-05-21 NOTE — Patient Instructions (Signed)
Your procedure is scheduled on: 05/23/2019 Thurs Report to Same Day Surgery 2nd floor medical mall [facility] Entrance-take elevator on left to 2nd floor.  Check in with surgery information desk.) To find out your arrival time please call [phone] between 1PM - 3PM on 05/22/2019 Wed  Remember: Instructions that are not followed completely may result in serious medical risk, up to and including death, or upon the discretion of your surgeon and anesthesiologist your surgery may need to be rescheduled.    _x___ 1. Do not eat food after midnight the night before your procedure. You may drink clear liquids up to 2 hours before you are scheduled to arrive at the hospital for your procedure.  Do not drink clear liquids within 2 hours of your scheduled arrival to the hospital.  Clear liquids include  --Water or Apple juice without pulp  --Clear carbohydrate beverage such as ClearFast or Gatorade  --Black Coffee or Clear Tea (No milk, no creamers, do not add anything to                  the coffee or Tea Type 1 and type 2 diabetics should only drink water.   ____Ensure clear carbohydrate drink on the way to the hospital for bariatric patients  ____Ensure clear carbohydrate drink 3 hours before surgery for Dr Dwyane Luo patients if physician instructed.   No gum chewing or hard candies.     __x__ 2. No Alcohol for 24 hours before or after surgery.   __x__3. No Smoking or e-cigarettes for 24 prior to surgery.  Do not use any chewable tobacco products for at least 6 hour prior to surgery   ____  4. Bring all medications with you on the day of surgery if instructed.    __x__ 5. Notify your doctor if there is any change in your medical condition     (cold, fever, infections).    x___6. On the morning of surgery brush your teeth with toothpaste and water.  You may rinse your mouth with mouth wash if you wish.  Do not swallow any toothpaste or mouthwash.   Do not wear jewelry, make-up,  hairpins, clips or nail polish.  Do not wear lotions, powders, or perfumes. You may wear deodorant.  Do not shave 48 hours prior to surgery. Men may shave face and neck.  Do not bring valuables to the hospital.    [facility] is not responsible for any belongings or valuables.               Contacts, dentures or bridgework may not be worn into surgery.  Leave your suitcase in the car. After surgery it may be brought to your room.  For patients admitted to the hospital, discharge time is determined by your                       treatment team.  _  Patients discharged the day of surgery will not be allowed to drive home.  You will need someone to drive you home and stay with you the night of your procedure.    Please read over the following fact sheets that you were given:   [facility] Preparing for Surgery and or MRSA Information   _x___ Take anti-hypertensive listed below, cardiac, seizure, asthma,     anti-reflux and psychiatric medicines. These include:  1. FLUoxetine (PROZAC) 20 MG tablet  2.  3.  4.  5.  6.  ____Fleets enema or  Magnesium Citrate as directed.   _x___ Use CHG Soap or sage wipes as directed on instruction sheet   ____ Use inhalers on the day of surgery and bring to hospital day of surgery  ____ Stop Metformin and Janumet 2 days prior to surgery.    ____ Take 1/2 of usual insulin dose the night before surgery and none on the morning     surgery.   _x___ Follow recommendations from Cardiologist, Pulmonologist or PCP regarding          stopping Aspirin, Coumadin, Plavix ,Eliquis, Effient, or Pradaxa, and Pletal.  X____Stop Anti-inflammatories such as Advil, Aleve, Ibuprofen, Motrin, Naproxen, Naprosyn, Goodies powders or aspirin products. OK to take Tylenol and                          Celebrex.   _x___ Stop supplements until after surgery.  But may continue Vitamin D, Vitamin B,       and multivitamin. Stop Turmeric today.   ____ Bring C-Pap to the hospital.

## 2019-05-22 LAB — URINE CULTURE

## 2019-05-23 ENCOUNTER — Ambulatory Visit
Admission: RE | Admit: 2019-05-23 | Discharge: 2019-05-23 | Disposition: A | Discharge status: Home / Self Care | Payer: PPO | Attending: Urology | Admitting: Urology

## 2019-05-23 ENCOUNTER — Other Ambulatory Visit: Payer: Self-pay | Admitting: Urology

## 2019-05-23 ENCOUNTER — Ambulatory Visit: Payer: PPO | Admitting: Registered Nurse

## 2019-05-23 ENCOUNTER — Encounter: Payer: Self-pay | Admitting: Urology

## 2019-05-23 ENCOUNTER — Encounter
Admission: RE | Disposition: A | Discharge status: Home / Self Care | Payer: Self-pay | Source: Home / Self Care | Attending: Urology

## 2019-05-23 ENCOUNTER — Ambulatory Visit: Payer: PPO

## 2019-05-23 DIAGNOSIS — M797 Fibromyalgia: Secondary | ICD-10-CM | POA: Insufficient documentation

## 2019-05-23 DIAGNOSIS — E119 Type 2 diabetes mellitus without complications: Secondary | ICD-10-CM | POA: Diagnosis not present

## 2019-05-23 DIAGNOSIS — N132 Hydronephrosis with renal and ureteral calculous obstruction: Secondary | ICD-10-CM | POA: Insufficient documentation

## 2019-05-23 DIAGNOSIS — K219 Gastro-esophageal reflux disease without esophagitis: Secondary | ICD-10-CM | POA: Diagnosis not present

## 2019-05-23 DIAGNOSIS — N200 Calculus of kidney: Secondary | ICD-10-CM | POA: Diagnosis not present

## 2019-05-23 DIAGNOSIS — N201 Calculus of ureter: Secondary | ICD-10-CM | POA: Diagnosis not present

## 2019-05-23 DIAGNOSIS — F329 Major depressive disorder, single episode, unspecified: Secondary | ICD-10-CM | POA: Insufficient documentation

## 2019-05-23 DIAGNOSIS — R569 Unspecified convulsions: Secondary | ICD-10-CM | POA: Insufficient documentation

## 2019-05-23 DIAGNOSIS — Z7984 Long term (current) use of oral hypoglycemic drugs: Secondary | ICD-10-CM | POA: Insufficient documentation

## 2019-05-23 DIAGNOSIS — G4733 Obstructive sleep apnea (adult) (pediatric): Secondary | ICD-10-CM | POA: Diagnosis not present

## 2019-05-23 DIAGNOSIS — Z79899 Other long term (current) drug therapy: Secondary | ICD-10-CM | POA: Insufficient documentation

## 2019-05-23 DIAGNOSIS — Z419 Encounter for procedure for purposes other than remedying health state, unspecified: Secondary | ICD-10-CM

## 2019-05-23 DIAGNOSIS — Z711 Person with feared health complaint in whom no diagnosis is made: Secondary | ICD-10-CM | POA: Diagnosis not present

## 2019-05-23 DIAGNOSIS — G4730 Sleep apnea, unspecified: Secondary | ICD-10-CM | POA: Insufficient documentation

## 2019-05-23 HISTORY — PX: CYSTOSCOPY/RETROGRADE/URETEROSCOPY: SHX5316

## 2019-05-23 LAB — GLUCOSE, CAPILLARY
Glucose-Capillary: 143 mg/dL — ABNORMAL HIGH (ref 70–99)
Glucose-Capillary: 143 mg/dL — ABNORMAL HIGH (ref 70–99)

## 2019-05-23 SURGERY — CYSTOSCOPY/RETROGRADE/URETEROSCOPY
Anesthesia: General | Laterality: Left

## 2019-05-23 MED ORDER — FENTANYL CITRATE (PF) 100 MCG/2ML IJ SOLN: 25.0000 ug | INTRAMUSCULAR | Status: DC | PRN

## 2019-05-23 MED ORDER — ONDANSETRON HCL 4 MG/2ML IJ SOLN
INTRAMUSCULAR | Status: DC | PRN
  Administered 2019-05-23: 4 mg via INTRAVENOUS

## 2019-05-23 MED ORDER — MIDAZOLAM HCL 2 MG/2ML IJ SOLN
INTRAMUSCULAR | Status: DC | PRN
  Administered 2019-05-23: 2 mg via INTRAVENOUS

## 2019-05-23 MED ORDER — SUCCINYLCHOLINE CHLORIDE 20 MG/ML IJ SOLN
INTRAMUSCULAR | Status: AC
  Filled 2019-05-23: qty 1

## 2019-05-23 MED ORDER — CEFAZOLIN SODIUM-DEXTROSE 2-4 GM/100ML-% IV SOLN
2.0000 g | Freq: Once | INTRAVENOUS | Status: AC
  Administered 2019-05-23: 2 g via INTRAVENOUS

## 2019-05-23 MED ORDER — FENTANYL CITRATE (PF) 100 MCG/2ML IJ SOLN
INTRAMUSCULAR | Status: DC | PRN
  Administered 2019-05-23: 100 ug via INTRAVENOUS
  Administered 2019-05-23: 50 ug via INTRAVENOUS

## 2019-05-23 MED ORDER — SUCCINYLCHOLINE CHLORIDE 20 MG/ML IJ SOLN
INTRAMUSCULAR | Status: DC | PRN
  Administered 2019-05-23: 100 mg via INTRAVENOUS

## 2019-05-23 MED ORDER — CEFAZOLIN SODIUM-DEXTROSE 2-4 GM/100ML-% IV SOLN
INTRAVENOUS | Status: AC
  Filled 2019-05-23: qty 100

## 2019-05-23 MED ORDER — ONDANSETRON HCL 4 MG/2ML IJ SOLN
INTRAMUSCULAR | Status: AC
  Filled 2019-05-23: qty 2

## 2019-05-23 MED ORDER — SUGAMMADEX SODIUM 200 MG/2ML IV SOLN
INTRAVENOUS | Status: AC
  Filled 2019-05-23: qty 2

## 2019-05-23 MED ORDER — LIDOCAINE HCL (CARDIAC) PF 100 MG/5ML IV SOSY
PREFILLED_SYRINGE | INTRAVENOUS | Status: DC | PRN
  Administered 2019-05-23: 100 mg via INTRAVENOUS

## 2019-05-23 MED ORDER — DIPHENHYDRAMINE HCL 50 MG/ML IJ SOLN
12.5000 mg | Freq: Once | INTRAMUSCULAR | Status: AC
  Administered 2019-05-23: 12.5 mg via INTRAVENOUS

## 2019-05-23 MED ORDER — OXYBUTYNIN CHLORIDE 5 MG PO TABS: ORAL_TABLET | ORAL | 0 refills | Status: DC

## 2019-05-23 MED ORDER — PROPOFOL 10 MG/ML IV BOLUS
INTRAVENOUS | Status: AC
  Filled 2019-05-23: qty 20

## 2019-05-23 MED ORDER — ACETAMINOPHEN 10 MG/ML IV SOLN
INTRAVENOUS | Status: DC | PRN
  Administered 2019-05-23: 1000 mg via INTRAVENOUS

## 2019-05-23 MED ORDER — FAMOTIDINE 20 MG PO TABS
20.0000 mg | ORAL_TABLET | Freq: Once | ORAL | Status: AC
  Administered 2019-05-23: 07:00:00 20 mg via ORAL

## 2019-05-23 MED ORDER — MIDAZOLAM HCL 2 MG/2ML IJ SOLN
INTRAMUSCULAR | Status: AC
  Filled 2019-05-23: qty 2

## 2019-05-23 MED ORDER — FENTANYL CITRATE (PF) 100 MCG/2ML IJ SOLN
INTRAMUSCULAR | Status: AC
  Filled 2019-05-23: qty 2

## 2019-05-23 MED ORDER — LIDOCAINE HCL (PF) 2 % IJ SOLN
INTRAMUSCULAR | Status: AC
  Filled 2019-05-23: qty 10

## 2019-05-23 MED ORDER — OXYCODONE-ACETAMINOPHEN 5-325 MG PO TABS: 1.0000 | ORAL_TABLET | Freq: Four times a day (QID) | ORAL | 0 refills | Status: DC | PRN

## 2019-05-23 MED ORDER — IOHEXOL 180 MG/ML  SOLN
INTRAMUSCULAR | Status: DC | PRN
  Administered 2019-05-23: 08:00:00 10 mL

## 2019-05-23 MED ORDER — DEXAMETHASONE SODIUM PHOSPHATE 10 MG/ML IJ SOLN
INTRAMUSCULAR | Status: AC
  Filled 2019-05-23: qty 1

## 2019-05-23 MED ORDER — SODIUM CHLORIDE 0.9 % IV SOLN
INTRAVENOUS | Status: DC
  Administered 2019-05-23: 07:00:00 via INTRAVENOUS

## 2019-05-23 MED ORDER — ACETAMINOPHEN 10 MG/ML IV SOLN
INTRAVENOUS | Status: AC
  Filled 2019-05-23: qty 100

## 2019-05-23 MED ORDER — ROCURONIUM BROMIDE 50 MG/5ML IV SOLN
INTRAVENOUS | Status: AC
  Filled 2019-05-23: qty 1

## 2019-05-23 MED ORDER — FAMOTIDINE 20 MG PO TABS
ORAL_TABLET | ORAL | Status: AC
  Administered 2019-05-23: 20 mg via ORAL
  Filled 2019-05-23: qty 1

## 2019-05-23 MED ORDER — ROCURONIUM BROMIDE 100 MG/10ML IV SOLN
INTRAVENOUS | Status: DC | PRN
  Administered 2019-05-23: 5 mg via INTRAVENOUS

## 2019-05-23 MED ORDER — PROPOFOL 10 MG/ML IV BOLUS
INTRAVENOUS | Status: DC | PRN
  Administered 2019-05-23: 160 mg via INTRAVENOUS

## 2019-05-23 MED ORDER — SUGAMMADEX SODIUM 200 MG/2ML IV SOLN
INTRAVENOUS | Status: DC | PRN
  Administered 2019-05-23: 100 mg via INTRAVENOUS

## 2019-05-23 MED ORDER — DEXAMETHASONE SODIUM PHOSPHATE 10 MG/ML IJ SOLN
INTRAMUSCULAR | Status: DC | PRN
  Administered 2019-05-23: 8 mg via INTRAVENOUS

## 2019-05-23 MED ORDER — ONDANSETRON HCL 4 MG/2ML IJ SOLN: 4.0000 mg | Freq: Once | INTRAMUSCULAR | Status: DC | PRN

## 2019-05-23 MED ORDER — SUGAMMADEX SODIUM 500 MG/5ML IV SOLN
INTRAVENOUS | Status: AC
  Filled 2019-05-23: qty 5

## 2019-05-23 MED ORDER — DIPHENHYDRAMINE HCL 50 MG/ML IJ SOLN
INTRAMUSCULAR | Status: AC
  Filled 2019-05-23: qty 1

## 2019-05-23 SURGICAL SUPPLY — 28 items
BAG DRAIN CYSTO-URO LG1000N (MISCELLANEOUS) ×3 IMPLANT
BASKET ZERO TIP 1.9FR (BASKET) IMPLANT
BRUSH SCRUB EZ 1% IODOPHOR (MISCELLANEOUS) ×3 IMPLANT
CATH URETL 5X70 OPEN END (CATHETERS) IMPLANT
CNTNR SPEC 2.5X3XGRAD LEK (MISCELLANEOUS)
CONT SPEC 4OZ STER OR WHT (MISCELLANEOUS)
CONTAINER SPEC 2.5X3XGRAD LEK (MISCELLANEOUS) IMPLANT
DRAPE UTILITY 15X26 TOWEL STRL (DRAPES) ×3 IMPLANT
FIBER LASER LITHO 273 (Laser) IMPLANT
GLOVE BIO SURGEON STRL SZ8 (GLOVE) ×3 IMPLANT
GOWN STRL REUS W/ TWL LRG LVL3 (GOWN DISPOSABLE) ×1 IMPLANT
GOWN STRL REUS W/ TWL XL LVL3 (GOWN DISPOSABLE) ×1 IMPLANT
GOWN STRL REUS W/TWL LRG LVL3 (GOWN DISPOSABLE) ×2
GOWN STRL REUS W/TWL XL LVL3 (GOWN DISPOSABLE) ×2
GUIDEWIRE STR DUAL SENSOR (WIRE) ×3 IMPLANT
INFUSOR MANOMETER BAG 3000ML (MISCELLANEOUS) ×3 IMPLANT
INTRODUCER DILATOR DOUBLE (INTRODUCER) IMPLANT
KIT TURNOVER CYSTO (KITS) ×3 IMPLANT
PACK CYSTO AR (MISCELLANEOUS) ×3 IMPLANT
SET CYSTO W/LG BORE CLAMP LF (SET/KITS/TRAYS/PACK) ×3 IMPLANT
SHEATH URETERAL 12FRX35CM (MISCELLANEOUS) IMPLANT
SOL .9 NS 3000ML IRR  AL (IV SOLUTION) ×2
SOL .9 NS 3000ML IRR AL (IV SOLUTION) ×1
SOL .9 NS 3000ML IRR UROMATIC (IV SOLUTION) ×1 IMPLANT
STENT URET 6FRX24 CONTOUR (STENTS) IMPLANT
STENT URET 6FRX26 CONTOUR (STENTS) IMPLANT
SURGILUBE 2OZ TUBE FLIPTOP (MISCELLANEOUS) ×3 IMPLANT
WATER STERILE IRR 1000ML POUR (IV SOLUTION) ×3 IMPLANT

## 2019-05-23 NOTE — Transfer of Care (Signed)
Immediate Anesthesia Transfer of Care Note  Patient: Samantha [name]  Procedure(s) Performed: CYSTOSCOPY/RETROGRADE/URETEROSCOPY (Left )  Patient Location: PACU  Anesthesia Type:General  Level of Consciousness: awake, alert  and oriented  Airway & Oxygen Therapy: Patient Spontanous Breathing and Patient connected to face mask oxygen  Post-op Assessment: Report given to RN and Post -op Vital signs reviewed and stable  Post vital signs: Reviewed and stable  Last Vitals:  Vitals Value Taken Time  BP 132/85 05/23/19 0816  Temp 36.7 C 05/23/19 0816  Pulse 86 05/23/19 0816  Resp 13 05/23/19 0816  SpO2 100 % 05/23/19 0816  Vitals shown include unvalidated device data.  Last Pain:  Vitals:   05/23/19 0816  TempSrc:   PainSc: 0-No pain         Complications: No apparent anesthesia complications

## 2019-05-23 NOTE — OR Nursing (Signed)
Denies itching at present.

## 2019-05-23 NOTE — Discharge Instructions (Addendum)
DISCHARGE INSTRUCTIONS FOR URETEROSCOPY   MEDICATIONS:  1. Resume all your other meds from home.  2.  AZO (over-the-counter) can help with the burning/stinging when you urinate. 3.  Oxycodone is for moderate/severe pain, Rx was sent to your pharmacy.  ACTIVITY:  1. May resume regular activities in 24 hours. 2. No driving while on narcotic pain medications  3. Drink plenty of water  4. Continue to walk at home - you can still get blood clots when you are at home, so keep active, but don't over do it.  5. May return to work/school tomorrow or when you feel ready   BATHING:  1.  No restrictions  SIGNS/SYMPTOMS TO CALL:  Please call us if you have a fever greater than 101.5, uncontrolled nausea/vomiting, uncontrolled pain, dizziness, unable to urinate, bloody urine, chest pain, shortness of breath, leg swelling, leg pain, or any other concerns or questions.   Urinary frequency, urgency, burning and blood in the urine will be normal.  You can reach [place] at [phone].   FOLLOW-UP:  1. You we will be contacted for a postop follow-up. 2.  Your stone was not present and looks like it had recently passed.  You may have pain for 24 hours secondary to residual swelling.   AMBULATORY SURGERY  DISCHARGE INSTRUCTIONS   1) The drugs that you were given will stay in your system until tomorrow so for the next 24 hours you should not:  A) Drive an automobile B) Make any legal decisions C) Drink any alcoholic beverage   2) You may resume regular meals tomorrow.  Today it is better to start with liquids and gradually work up to solid foods.  You may eat anything you prefer, but it is better to start with liquids, then soup and crackers, and gradually work up to solid foods.   3) Please notify your doctor immediately if you have any unusual bleeding, trouble breathing, redness and pain at the surgery site, drainage, fever, or pain not relieved by medication.    4) Additional Instructions:    Both oxybutynin and percocet sent to you pharmacy electronically per Dr. Elenor Quinones        Please contact your physician with any problems or Same Day Surgery at [phone], Monday through Friday 6 am to 4 pm, or [place] at [facility] number at [phone].

## 2019-05-23 NOTE — Anesthesia Postprocedure Evaluation (Signed)
Anesthesia Post Note  Patient: Samantha [name]  Procedure(s) Performed: CYSTOSCOPY/RETROGRADE/URETEROSCOPY (Left )  Patient location during evaluation: PACU Anesthesia Type: General Level of consciousness: awake and alert Pain management: pain level controlled Vital Signs Assessment: post-procedure vital signs reviewed and stable Respiratory status: spontaneous breathing and respiratory function stable Cardiovascular status: stable Anesthetic complications: no     Last Vitals:  Vitals:   05/23/19 0624 05/23/19 0816  BP: 126/81 132/85  Pulse: 92 84  Resp: 16 14  Temp: (!) 36 C 36.7 C  SpO2: 100% 100%    Last Pain:  Vitals:   05/23/19 0816  TempSrc:   PainSc: 0-No pain                 Nelissa Bolduc K

## 2019-05-23 NOTE — Anesthesia Procedure Notes (Signed)
Procedure Name: Intubation Date/Time: 05/23/2019 7:39 AM Performed by: Hedda Slade, CRNA Pre-anesthesia Checklist: Patient identified, Patient being monitored, Timeout performed, Emergency Drugs available and Suction available Patient Re-evaluated:Patient Re-evaluated prior to induction Oxygen Delivery Method: Circle system utilized Preoxygenation: Pre-oxygenation with 100% oxygen Induction Type: IV induction Ventilation: Mask ventilation without difficulty Laryngoscope Size: 3 and McGraph Grade View: Grade I Tube type: Oral Tube size: 7.0 mm Number of attempts: 1 Airway Equipment and Method: Stylet Placement Confirmation: ETT inserted through vocal cords under direct vision,  positive ETCO2 and breath sounds checked- equal and bilateral Secured at: 21 cm Tube secured with: Tape Dental Injury: Teeth and Oropharynx as per pre-operative assessment

## 2019-05-23 NOTE — Anesthesia Preprocedure Evaluation (Signed)
Anesthesia Evaluation  Patient identified by MRN, date of birth, ID band Patient awake    Reviewed: Allergy & Precautions, NPO status , Patient's Chart, lab work & pertinent test results  History of Anesthesia Complications Negative for: history of anesthetic complications  Airway Mallampati: III       Dental   Pulmonary sleep apnea (not using CPAP) , neg COPD,           Cardiovascular (-) hypertension(-) Past MI and (-) CHF (-) dysrhythmias + Valvular Problems/Murmurs MVP      Neuro/Psych Seizures - (w/u for seizures - not having sz, but "passes out" with HA),  Anxiety Depression Bipolar Disorder    GI/Hepatic Neg liver ROS, GERD  Medicated and Poorly Controlled,  Endo/Other  diabetes, Type 2, Oral Hypoglycemic Agents  Renal/GU Renal disease (stones)     Musculoskeletal  (+) Arthritis , Fibromyalgia -  Abdominal   Peds  Hematology  (+) anemia ,   Anesthesia Other Findings   Reproductive/Obstetrics                             Anesthesia Physical Anesthesia Plan  ASA: III  Anesthesia Plan: General   Post-op Pain Management:    Induction: Intravenous  PONV Risk Score and Plan: 3 and Dexamethasone, Ondansetron and Midazolam  Airway Management Planned: Oral ETT  Additional Equipment:   Intra-op Plan:   Post-operative Plan:   Informed Consent: I have reviewed the patients History and Physical, chart, labs and discussed the procedure including the risks, benefits and alternatives for the proposed anesthesia with the patient or authorized representative who has indicated his/her understanding and acceptance.       Plan Discussed with:   Anesthesia Plan Comments:         Anesthesia Quick Evaluation

## 2019-05-23 NOTE — OR Nursing (Signed)
C/o "my face is starting to itch"; discussed with Dr. Ronelle Nigh, benadryl 12.5mg  IV given as ordered.

## 2019-05-23 NOTE — Interval H&P Note (Signed)
History and Physical Interval Note:  05/23/2019 7:19 AM  Samantha [name]  has presented today for surgery, with the diagnosis of nephrolithiasis.  The various methods of treatment have been discussed with the patient and family. After consideration of risks, benefits and other options for treatment, the patient has consented to  Procedure(s): CYSTOSCOPY/URETEROSCOPY/HOLMIUM LASER/STENT PLACEMENT (Left) as a surgical intervention.  The patient's history has been reviewed, patient examined, no change in status, stable for surgery.  I have reviewed the patient's chart and labs.  Questions were answered to the patient's satisfaction.     [place]

## 2019-05-23 NOTE — Anesthesia Post-op Follow-up Note (Signed)
Anesthesia QCDR form completed.        

## 2019-05-23 NOTE — Op Note (Signed)
Preoperative diagnosis: Left ureteral calculus  Postoperative diagnosis: Passed ureteral calculus  Procedure:  1. Cystoscopy 2. Left ureteroscopy-diagnostic 3. Left retrograde pyelography with interpretation   Surgeon: Nicki Reaper C. , M.D.  Anesthesia: General  Complications: None  Intraoperative findings:  1.  Left retrograde pyelography showed no calculus, filling defects.  Mild dilation of the left distal ureter which did  EBL: None  Specimens: 1. None   Indication: Samantha [name] is a 38 y.o. year old patient with a 5 mm left UVJ calculus.  This morning she was still having intermittent symptoms and denied passing a stone.  After reviewing the management options for treatment, the patient elected to proceed with the above surgical procedure(s). We have discussed the potential benefits and risks of the procedure, side effects of the proposed treatment, the likelihood of the patient achieving the goals of the procedure, and any potential problems that might occur during the procedure or recuperation. Informed consent has been obtained.  Description of procedure:  The patient was taken to the operating room and general anesthesia was induced.  The patient was placed in the dorsal lithotomy position, prepped and draped in the usual sterile fashion, and preoperative antibiotics were administered. A preoperative time-out was performed.   A 21 French cystoscope with obturator was lubricated and passed per urethra.  The urethra was slightly tight but passed without difficulty.  Panendoscopy was performed and the bladder mucosa showed no solid or papillary lesions.  Mild edema and erythema was noted at the left ureteral orifice.  A 0.038 Sensor wire was then advanced up the left ureter into the renal pelvis under fluoroscopic guidance without difficulty.  A 4.5 Fr semirigid ureteroscope was then advanced into the ureter next to the guidewire and no calculus was identified in the  distal ureter.  The ureteroscope was advanced to the upper proximal ureter and no stone was identified.  Pullout retrograde pyelogram was then performed through the ureteroscope with findings as described above.  The bladder was then emptied and the procedure ended.  The patient appeared to tolerate the procedure well and without complications.  After anesthetic reversal the patient was transported to the PACU in stable condition.    [name], MD

## 2019-06-10 ENCOUNTER — Other Ambulatory Visit: Payer: PPO

## 2019-06-12 ENCOUNTER — Encounter: Payer: Self-pay | Admitting: Family Medicine

## 2019-06-12 ENCOUNTER — Other Ambulatory Visit: Payer: Self-pay

## 2019-06-12 ENCOUNTER — Ambulatory Visit (INDEPENDENT_AMBULATORY_CARE_PROVIDER_SITE_OTHER): Payer: PPO | Admitting: Family Medicine

## 2019-06-12 VITALS — Wt 205.0 lb

## 2019-06-12 DIAGNOSIS — Z719 Counseling, unspecified: Secondary | ICD-10-CM

## 2019-06-12 DIAGNOSIS — I152 Hypertension secondary to endocrine disorders: Secondary | ICD-10-CM | POA: Insufficient documentation

## 2019-06-12 DIAGNOSIS — E118 Type 2 diabetes mellitus with unspecified complications: Secondary | ICD-10-CM | POA: Diagnosis not present

## 2019-06-12 DIAGNOSIS — F39 Unspecified mood [affective] disorder: Secondary | ICD-10-CM

## 2019-06-12 DIAGNOSIS — I10 Essential (primary) hypertension: Secondary | ICD-10-CM

## 2019-06-12 DIAGNOSIS — E669 Obesity, unspecified: Secondary | ICD-10-CM

## 2019-06-12 DIAGNOSIS — E1159 Type 2 diabetes mellitus with other circulatory complications: Secondary | ICD-10-CM | POA: Diagnosis not present

## 2019-06-12 NOTE — Progress Notes (Signed)
Telehealth office visit note for Samantha [name], D.O- at Primary Care at [facility]   I connected with current patient today and verified that I am speaking with the correct person using two identifiers.   . Location of the patient: Home . Location of the provider: Office Only the patient (+/- their family members at pt's discretion) and myself were participating in the encounter    - This visit type was conducted due to national recommendations for restrictions regarding the COVID-19 Pandemic (e.g. social distancing) in an effort to limit this patient's exposure and mitigate transmission in our community.  This format is felt to be most appropriate for this patient at this time.   - The patient did not have access to video technology or had technical difficulties with video requiring transitioning to audio format only. - No physical exam could be performed with this format, beyond that communicated to [place] by the patient/ family members as noted.   - Additionally my office staff/ schedulers discussed with the patient that there may be a monetary charge related to this service, depending on their medical insurance.   The patient expressed understanding, and agreed to proceed.       History of Present Illness: -Last office visit we increased Prozac;  she was told to come in for fasting blood work and then have an office visit with me to go over that blood work.   She continues to lack compliance with her treatment plan.      Mood:   - Last office visit we increased Prozac from 10 to 20 mg daily.  She declined a Social worker.  Her mood has been improved.  She states that she does not have as large of swings as prior and she is able to control her emotions much better-more calm, less reactive etc. -Does not feel like she needs to increase dose beyond the 20 mg daily.   Obese:  -  225 early 2020 - now down to 205.   Stopped soda, eating smaller portions, less snacking. Clothes fitting  better.  She is feeling better.   Blood pressure:   -Historically patient has had low blood pressure but more recently-upon review of the chart her blood pressures been running over 130/80 on a regular basis. -Her husband will has hypertension as well as diabetes etc. and they do have a blood pressure cuff but patient is not checking it at home as advised prior - Recently Bp's in chart running 130's/ 80's.  -Patient is feeling well.  No headaches, dizziness, visual changes, shortness of breath, chest discomfort etc.  Denies increased swelling in the legs etc. - pt has not wanted to go on a small blood pressure dose to protect her kidneys and to bring down her BP under 130/80 on a regular basis   DM HPI:  - has never been on a statin medication- - we discussed prior that all diabetics should be on them but patient again today does not want to go on a new medication at all.  She understands risks of vascular disease especially since her husband Will had a BKA due to diabetic vascular complications  -Has been on 1.2 mg Victoza for 3- 4 months now.  Misses doses- couple days per week.  -Unable to tolerate metformin. -  She has been working on diet and exercise for diabetes  Medication compliance - good  Home fasting glucose readings: 111- 130's rarely at 150 or less;  2  hr PP: not checking   Denies polyuria/polydipsia.  Denies hypo/ hyperglycemia symptoms  Last diabetic eye exam was No results found for: HMDIABEYEEXA  Last A1C in the office was:  Lab Results  Component Value Date   HGBA1C 7.0 (A) 11/08/2018   HGBA1C 6.9 07/12/2018   HGBA1C 7.2 (H) 04/06/2018    Lab Results  Component Value Date   MICROALBUR 30 04/06/2018   LDLCALC 51 04/06/2018   CREATININE 0.89 05/12/2019    Wt Readings from Last 3 Encounters:  06/12/19 205 lb (93 kg)  05/21/19 208 lb (94.3 kg)  05/20/19 209 lb 12.8 oz (95.2 kg)    BP Readings from Last 3 Encounters:  05/23/19 138/77  05/21/19 (!)  134/97  05/20/19 126/75          Impression and Recommendations:    1. Type 2 diabetes mellitus with complication, without long-term current use of insulin ([place])   2. Hypertension associated with diabetes ([place])   3. Mood disorder (HCC)   4. Obesity (BMI 30-39.9)   5. Health education/counseling     Diabetes mellitus:  -Blood sugars appear well controlled at home -Discussed with patient option of switching from Victoza to [place].  However patient has been on Victoza 3 4 months and did not come in for A1c.   - We will obtain in the near future.  Patient was told to come in in the next 7 days for fasting blood work. -Reminded patient again today she needs a diabetic eye exam yearly.  Patient knows this as her husband is a diabetic -Also recommend patient to go on a statin medication due to her diabetic state.  Explained ADA guidelines in all diabetics are increased risk for cardiovascular disease especially-her husband lost his leg due to it.  She understands all of these risks and declines a statin medication today again.     Elevated blood pressure above goal with history of diabetes:  -Told patient that due to being a diabetic her atherosclerotic cardiovascular risk is much greater.   -Reminded patient for the need to be on a renal protective agent due to diabetes irregardless of blood pressure. -  patient declines ACE or ARB again today. -Recommend she check her blood pressure at home at least daily, keep a log and write them down.  Also told to log heart rates as well -Recommend she follow-up sooner than planned if blood pressure is above 130/80 on a regular basis    Mood disorder:  -Patient doing well on the increase in Prozac.  No signs of bipolar/ mania. -Continue current dose.    Obesity:  - Explained to patient what BMI refers to, and what it means medically.    Told patient to think about it as a "medical risk stratification measurement" and how increasing BMI  is associated with increasing risk/ or worsening state of various diseases such as hypertension, hyperlipidemia, diabetes, premature OA, depression etc.  Recommend continue to decrease her BMI.  Almost under 30 which is fantastic.  American Heart Association guidelines for healthy diet, basically Mediterranean diet, and exercise guidelines of 30 minutes 5 days per week or more discussed in detail.  Health counseling performed.  All questions answered.   - As part of my medical decision making, I reviewed the following data within the [facility] History obtained from pt /family, CMA notes reviewed and incorporated if applicable, Labs reviewed, Radiograph/ tests reviewed if applicable and OV notes from prior OV's with me, as well  as other specialists she/he has seen since seeing me last, were all reviewed and used in my medical decision making process today.   - Additionally, discussion had with patient regarding txmnt plan, and their biases/concerns about that plan were used in my medical decision making today.   - The patient agreed with the plan and demonstrated an understanding of the instructions.   No barriers to understanding were identified.   - Red flag symptoms and signs discussed in detail.  Patient expressed understanding regarding what to do in case of emergency\ urgent symptoms.  The patient was advised to call back or seek an in-person evaluation if the symptoms worsen or if the condition fails to improve as anticipated.   Return for come in [place];  then in 3 mo- DM, BP, etc.    -Fasting blood work orders for full set are active and in her chart for her to come in in the near future for the blood work   I provided 21+ minutes of non-face-to-face time during this encounter,with over 50% of the time in direct counseling on patients medical conditions/ medical concerns.  Additional time was spent with charting and coordination of care after the actual  visit commenced.   Note:  This note was prepared with assistance of Dragon voice recognition software. Occasional wrong-word or sound-a-like substitutions may have occurred due to the inherent limitations of voice recognition software.  Samantha Dance, DO 06/12/2019 11:45 AM    Patient Care Team    Relationship Specialty Notifications Start End  Samantha Dance, DO PCP - General Family Medicine  09/29/16   Pedro Earls, MD Attending Physician Family Medicine  09/29/16   Carol Ada, MD Consulting Physician Gastroenterology  06/19/17   Marcial Pacas, MD Consulting Physician Neurology  08/24/17   Willia Craze, NP Nurse Practitioner Gastroenterology  04/06/18   Ob/Gyn, Esmond Plants    07/24/18      -Vitals obtained; medications/ allergies reconciled;  personal medical, social, Sx etc.histories were updated by CMA, reviewed by me and are reflected in chart   Patient Active Problem List   Diagnosis Date Noted  . Hypertension associated with diabetes ([place]) 06/12/2019    Priority: High  . Mood disorder ([place]) 06/12/2019    Priority: High  . Type 2 diabetes mellitus with complication, without long-term current use of insulin ([place]) 12/29/2015    Priority: High  . Obesity (BMI 30-39.9) 02/17/2014    Priority: High  . Osteoarthritis of spine with radiculopathy, lumbar region 09/04/2018    Priority: Medium  . Acute folliculitis- likely due to MRSA, they are draining 06/19/2017    Priority: Medium  . Personal history of MRSA (methicillin resistant Staphylococcus aureus) 06/19/2017    Priority: Medium  . Environmental and seasonal allergies 09/29/2016    Priority: Medium  . GERD (gastroesophageal reflux disease) 09/29/2016    Priority: Medium  . Bipolar 1 disorder - Anxiety 09/28/2016    Priority: Medium  . Fatty liver 09/05/2014    Priority: Medium  . Chronic diarrhea  06/19/2017    Priority: Low  . Contact dermatitis and eczema- due to tape\ Band-Aid 06/19/2017    Priority:  Low  . Fibromyalgia 08/11/2016    Priority: Low  . OSA on CPAP 12/08/2014    Priority: Low  . Obstructive sleep apnea syndrome, moderate 11/24/2014    Priority: Low  . S/P abdominal hysterectomy 08/15/2014    Priority: Low  . Ureteral calculus 05/20/2019  . Renal colic 95/62/1308  .  Hydronephrosis with urinary obstruction due to ureteral calculus 05/20/2019  . Irritability and anger 03/06/2019  . Heartburn 03/04/2019  . Chronic rhinitis 03/04/2019  . Adverse food reaction 03/04/2019  . Globus sensation 02/28/2019  . Finger pain, left 01/01/2019  . Allergy history unknown- possible throat closure sensation to certain foods 40/81/4481  . Neuropathic pain 09/04/2018  . Chronic radicular pain of lower back 09/04/2018  . Lumbar foraminal stenosis-L5-S1 09/04/2018  . Obesity, Class I, BMI 30-34.9 09/04/2018  . Piriformis syndrome, left 07/12/2018  . Single episode of elevated blood pressure 07/12/2018  . Hypertriglyceridemia 04/11/2018  . Low level of high density lipoprotein (HDL) 04/11/2018  . Vitamin D insufficiency 04/11/2018  . Epigastric abdominal pain with N 04/06/2018  . History of vitamin D deficiency 04/06/2018  . Spondylosis without myelopathy or radiculopathy, lumbar region 10/03/2017  . Abdominal pain, LUQ 05/02/2017  . Abdominal pain, LLQ 05/02/2017  . Hx of diarrhea 05/02/2017  . Bladder dysfunction 04/30/2014  . Nephrolithiasis 03/04/2014  . Agenesis, corpus callosum ([place]) 02/17/2014  . GAD (generalized anxiety disorder) 02/17/2014  . History of blood transfusion 02/17/2014     Current Meds  Medication Sig  . amitriptyline (ELAVIL) 10 MG tablet Take 20 mg by mouth at bedtime.   . blood glucose meter kit and supplies Dispense based on patient and insurance preference. Use to check blood glucose levels fasting in the morning and 2 hours after largest meal daily.  (FOR ICD-10 E10.9, E11.9).  Marland Kitchen FLUoxetine (PROZAC) 20 MG tablet 1 tablet daily (Patient taking  differently: Take 20 mg by mouth daily. )  . liraglutide (VICTOZA) 18 MG/3ML SOPN 1.77m [place] qd (Patient taking differently: Inject 1.2 mg into the skin daily. )  . oxybutynin (DITROPAN) 5 MG tablet 1 tab tid prn frequency,urgency, bladder spasm  . pregabalin (LYRICA) 75 MG capsule Take 75 mg by mouth 2 (two) times daily.  . Turmeric 500 MG TABS Take 500 mg by mouth daily.  . valACYclovir (VALTREX) 500 MG tablet Take 500 mg by mouth daily as needed (cold sores).      Allergies:  Allergies  Allergen Reactions  . Sulfa Antibiotics Anaphylaxis and Swelling  . Topiramate Swelling    Tongue  . Duloxetine Hcl Other (See Comments)    Weight gain, feels "crazy"  . Ibuprofen     Due to taking Elavil, causes counter headaches when she takes Ibuprofen  . Metformin And Related Other (See Comments)    GI Upset  . Penicillins Rash    Has patient had a PCN reaction causing immediate rash, facial/tongue/throat swelling, SOB or lightheadedness with hypotension: Yes Has patient had a PCN reaction causing severe rash involving mucus membranes or skin necrosis: No Has patient had a PCN reaction that required hospitalization No Has patient had a PCN reaction occurring within the last 10 years: No If all of the above answers are "NO", then may proceed with Cephalosporin use.      ROS:  See above HPI for pertinent positives and negatives   Objective:   Weight 205 lb (93 kg), last menstrual period 08/05/2014.  (if some vitals are omitted, this means that patient was UNABLE to obtain them even though they were asked to get them prior to [place].  They were asked to call u[place]at their earliest convenience with these once obtained. )  General: A & O * 3; sounds in no acute distress; in usual state of health.  Skin: Pt confirms warm and dry extremities and pink  fingertips HEENT: Pt confirms lips non-cyanotic Chest: Patient confirms normal chest excursion and movement Respiratory: speaking in full  sentences, no conversational dyspnea; patient confirms no use of accessory muscles Psych: insight appears good, mood- appears full

## 2019-08-09 ENCOUNTER — Encounter: Payer: Self-pay | Admitting: Emergency Medicine

## 2019-08-09 ENCOUNTER — Emergency Department
Admission: EM | Admit: 2019-08-09 | Discharge: 2019-08-09 | Disposition: A | Discharge status: Home / Self Care | Payer: PPO | Attending: Emergency Medicine | Admitting: Emergency Medicine

## 2019-08-09 ENCOUNTER — Other Ambulatory Visit: Payer: Self-pay

## 2019-08-09 ENCOUNTER — Emergency Department: Payer: PPO

## 2019-08-09 DIAGNOSIS — K760 Fatty (change of) liver, not elsewhere classified: Secondary | ICD-10-CM | POA: Diagnosis not present

## 2019-08-09 DIAGNOSIS — Z79899 Other long term (current) drug therapy: Secondary | ICD-10-CM | POA: Insufficient documentation

## 2019-08-09 DIAGNOSIS — I10 Essential (primary) hypertension: Secondary | ICD-10-CM | POA: Insufficient documentation

## 2019-08-09 DIAGNOSIS — R1011 Right upper quadrant pain: Secondary | ICD-10-CM | POA: Diagnosis not present

## 2019-08-09 DIAGNOSIS — E119 Type 2 diabetes mellitus without complications: Secondary | ICD-10-CM | POA: Insufficient documentation

## 2019-08-09 DIAGNOSIS — R1010 Upper abdominal pain, unspecified: Secondary | ICD-10-CM

## 2019-08-09 LAB — COMPREHENSIVE METABOLIC PANEL
ALT: 16 U/L (ref 0–44)
AST: 13 U/L — ABNORMAL LOW (ref 15–41)
Albumin: 4.2 g/dL (ref 3.5–5.0)
Alkaline Phosphatase: 75 U/L (ref 38–126)
Anion gap: 9 (ref 5–15)
BUN: 13 mg/dL (ref 6–20)
CO2: 28 mmol/L (ref 22–32)
Calcium: 9.2 mg/dL (ref 8.9–10.3)
Chloride: 101 mmol/L (ref 98–111)
Creatinine, Ser: 0.71 mg/dL (ref 0.44–1.00)
GFR calc Af Amer: >60 mL/min (ref >60)
GFR calc non Af Amer: >60 mL/min (ref >60)
Glucose, Bld: 104 mg/dL — ABNORMAL HIGH (ref 70–99)
Potassium: 4 mmol/L (ref 3.5–5.1)
Sodium: 138 mmol/L (ref 135–145)
Total Bilirubin: 0.2 mg/dL — ABNORMAL LOW (ref 0.3–1.2)
Total Protein: 7.9 g/dL (ref 6.5–8.1)

## 2019-08-09 LAB — URINALYSIS, COMPLETE (UACMP) WITH MICROSCOPIC
Bacteria, UA: NONE SEEN
Bilirubin Urine: NEGATIVE
Glucose, UA: NEGATIVE mg/dL
Hgb urine dipstick: NEGATIVE
Ketones, ur: NEGATIVE mg/dL
Nitrite: NEGATIVE
Protein, ur: NEGATIVE mg/dL
Specific Gravity, Urine: 1.023 (ref 1.005–1.030)
pH: 5 (ref 5.0–8.0)

## 2019-08-09 LAB — CBC
HCT: 42.5 % (ref 36.0–46.0)
Hemoglobin: 13.4 g/dL (ref 12.0–15.0)
MCH: 27 pg (ref 26.0–34.0)
MCHC: 31.5 g/dL (ref 30.0–36.0)
MCV: 85.7 fL (ref 80.0–100.0)
Platelets: 191 10*3/uL (ref 150–400)
RBC: 4.96 MIL/uL (ref 3.87–5.11)
RDW: 13.1 % (ref 11.5–15.5)
WBC: 8.3 10*3/uL (ref 4.0–10.5)
nRBC: 0 % (ref 0.0–0.2)

## 2019-08-09 LAB — LIPASE, BLOOD: Lipase: 22 U/L (ref 11–51)

## 2019-08-09 MED ORDER — TRAMADOL HCL 50 MG PO TABS: 50.0000 mg | ORAL_TABLET | Freq: Four times a day (QID) | ORAL | 0 refills | Status: DC | PRN

## 2019-08-09 MED ORDER — KETOROLAC TROMETHAMINE 30 MG/ML IJ SOLN
30.0000 mg | Freq: Once | INTRAMUSCULAR | Status: AC
  Administered 2019-08-09: 30 mg via INTRAMUSCULAR
  Filled 2019-08-09: qty 1

## 2019-08-09 MED ORDER — PANTOPRAZOLE SODIUM 20 MG PO TBEC: 20.0000 mg | DELAYED_RELEASE_TABLET | Freq: Every day | ORAL | 1 refills | Status: DC

## 2019-08-09 NOTE — ED Provider Notes (Signed)
[facility] Emergency Department Provider Note   ____________________________________________    I have reviewed the triage vital signs and the nursing notes.   HISTORY  Chief Complaint Abdominal Pain     HPI Samantha [name] is a 38 y.o. female who presents with complaints of right upper quadrant abdominal pain.  Patient reports this pain started after lunch today.  She reports it is been constant since then describes it as moderate in intensity.  No radiation.  No fevers or chills.  No nausea or vomiting.  Has not take anything for this.  No history of the same.  No sick contacts.  Normal stools.  Past Medical History:  Diagnosis Date  . Anemia   . Angio-edema   . Anxiety   . Complication of anesthesia    slow to wake up  . Depression    rx presc but not taken  . Diabetes mellitus without complication ([place])   . Fibromyalgia   . GERD (gastroesophageal reflux disease)    no meds  . Headache(784.0)    tension and with anxiety hx  . Hepatic steatosis   . History of kidney stones   . MVP (mitral valve prolapse)    echo 10  . Pneumonia    hx  . Seizures ([place])    non epileptic events per epilepsy monitoring unit  . Seizures ([place])    last seizure July 2015- does not convulse but has a sleep effect  . Sinus infection    recent on antibiotic  . Sleep apnea    has cpap does not use    Patient Active Problem List   Diagnosis Date Noted  . Hypertension associated with diabetes ([place]) 06/12/2019  . Mood disorder ([place]) 06/12/2019  . Ureteral calculus 05/20/2019  . Renal colic 20/35/5974  . Hydronephrosis with urinary obstruction due to ureteral calculus 05/20/2019  . Irritability and anger 03/06/2019  . Heartburn 03/04/2019  . Chronic rhinitis 03/04/2019  . Adverse food reaction 03/04/2019  . Globus sensation 02/28/2019  . Finger pain, left 01/01/2019  . Allergy history unknown- possible throat closure sensation to certain foods 16/38/4536   . Neuropathic pain 09/04/2018  . Chronic radicular pain of lower back 09/04/2018  . Lumbar foraminal stenosis-L5-S1 09/04/2018  . Osteoarthritis of spine with radiculopathy, lumbar region 09/04/2018  . Obesity, Class I, BMI 30-34.9 09/04/2018  . Piriformis syndrome, left 07/12/2018  . Single episode of elevated blood pressure 07/12/2018  . Hypertriglyceridemia 04/11/2018  . Low level of high density lipoprotein (HDL) 04/11/2018  . Vitamin D insufficiency 04/11/2018  . Epigastric abdominal pain with N 04/06/2018  . History of vitamin D deficiency 04/06/2018  . Spondylosis without myelopathy or radiculopathy, lumbar region 10/03/2017  . Acute folliculitis- likely due to MRSA, they are draining 06/19/2017  . Personal history of MRSA (methicillin resistant Staphylococcus aureus) 06/19/2017  . Chronic diarrhea  06/19/2017  . Contact dermatitis and eczema- due to tape\ Band-Aid 06/19/2017  . Abdominal pain, LUQ 05/02/2017  . Abdominal pain, LLQ 05/02/2017  . Hx of diarrhea 05/02/2017  . Environmental and seasonal allergies 09/29/2016  . GERD (gastroesophageal reflux disease) 09/29/2016  . Bipolar 1 disorder - Anxiety 09/28/2016  . Fibromyalgia 08/11/2016  . Type 2 diabetes mellitus with complication, without long-term current use of insulin ([place]) 12/29/2015  . OSA on CPAP 12/08/2014  . Obstructive sleep apnea syndrome, moderate 11/24/2014  . Fatty liver 09/05/2014  . S/P abdominal hysterectomy 08/15/2014  . Bladder dysfunction 04/30/2014  .  Nephrolithiasis 03/04/2014  . Agenesis, corpus callosum ([place]) 02/17/2014  . GAD (generalized anxiety disorder) 02/17/2014  . History of blood transfusion 02/17/2014  . Obesity (BMI 30-39.9) 02/17/2014    Past Surgical History:  Procedure Laterality Date  . ABDOMINAL HYSTERECTOMY N/A 08/15/2014   Procedure: HYSTERECTOMY ABDOMINAL WITH CYSTOSCOPY;  Surgeon: Sanjuana Kava, MD;  Location: [facility];  Service: Gynecology;  Laterality: N/A;  . CESAREAN  SECTION N/A   . CYSTOSCOPY/RETROGRADE/URETEROSCOPY Left 05/23/2019   Procedure: CYSTOSCOPY/RETROGRADE/URETEROSCOPY;  Surgeon: Abbie Sons, MD;  Location: [facility];  Service: Urology;  Laterality: Left;  . SHOULDER ARTHROSCOPY WITH SUBACROMIAL DECOMPRESSION AND OPEN ROTATOR C Left 11/27/2015   Procedure: LEFT SHOULDER ARTHROSCOPY WITH SUBACROMIAL DECOMPRESSION, MINI OPEN ROTATOR CUFF REPAIR;  Surgeon: Netta Cedars, MD;  Location: [facility];  Service: Orthopedics;  Laterality: Left;  . TUBAL LIGATION      Prior to Admission medications   Medication Sig Start Date End Date Taking? Authorizing Provider  amitriptyline (ELAVIL) 10 MG tablet Take 20 mg by mouth at bedtime.  03/21/19   [provider]  blood glucose meter kit and supplies Dispense based on patient and insurance preference. Use to check blood glucose levels fasting in the morning and 2 hours after largest meal daily.  (FOR ICD-10 E10.9, E11.9). 04/06/18   Opalski, Neoma Laming, DO  FLUoxetine (PROZAC) 20 MG tablet 1 tablet daily Patient taking differently: Take 20 mg by mouth daily.  04/17/19   Opalski, Deborah, DO  liraglutide (VICTOZA) 18 MG/3ML SOPN 1.96m [place] qd Patient taking differently: Inject 1.2 mg into the skin daily.  04/17/19   OMellody Dance DO  oxybutynin (DITROPAN) 5 MG tablet 1 tab tid prn frequency,urgency, bladder spasm 05/23/19   Stoioff, SRonda Fairly MD  pantoprazole (PROTONIX) 20 MG tablet Take 1 tablet (20 mg total) by mouth daily. 08/09/19 08/08/20  KLavonia Drafts MD  pregabalin (LYRICA) 75 MG capsule Take 75 mg by mouth 2 (two) times daily.    [provider]  traMADol (ULTRAM) 50 MG tablet Take 1 tablet (50 mg total) by mouth every 6 (six) hours as needed. 08/09/19 08/08/20  KLavonia Drafts MD  Turmeric 500 MG TABS Take 500 mg by mouth daily.    [provider]  valACYclovir (VALTREX) 500 MG tablet Take 500 mg by mouth daily as needed (cold sores).     [provider]     Allergies Sulfa  antibiotics, Topiramate, Duloxetine hcl, Ibuprofen, Metformin and related, and Penicillins  Family History  Problem Relation Age of Onset  . Diabetes Mother   . Hypertension Mother   . Healthy Father   . Breast cancer Maternal Grandmother   . Cancer Maternal Grandfather        brain, lung, liver  . Heart attack Paternal Grandfather     Social History Social History   Tobacco Use  . Smoking status: Never Smoker  . Smokeless tobacco: Never Used  Substance Use Topics  . Alcohol use: No  . Drug use: No    Review of Systems  Constitutional: No fever/chills Eyes: No visual changes.  ENT: No sore throat. Cardiovascular: Denies chest pain. Respiratory: Denies shortness of breath. Gastrointestinal: As above Genitourinary: Negative for dysuria. Musculoskeletal: Negative for back pain. Skin: Negative for rash. Neurological: Negative for headaches   ____________________________________________   PHYSICAL EXAM:  VITAL SIGNS: ED Triage Vitals [08/09/19 1954]  Enc Vitals Group     BP 137/79     Pulse      Resp 20  Temp 98.3 F (36.8 C)     Temp Source Oral     SpO2 100 %     Weight 89.8 kg (198 lb)     Height 1.753 m (_0 )     Head Circumference      Peak Flow      Pain Score 9     Pain Loc      Pain Edu?      Excl. in [place]?     Constitutional: Alert and oriented.  Eyes: Conjunctivae are normal.   Nose: No congestion/rhinnorhea. Mouth/Throat: Mucous membranes are moist.    Cardiovascular: Normal rate, regular rhythm.  Good peripheral circulation. Respiratory: Normal respiratory effort.  No retractions. Lungs CTAB. Gastrointestinal: Soft, right upper quadrant tenderness mild otherwise reassuring exam  Musculoskeletal:   Warm and well perfused Neurologic:  Normal speech and language. No gross focal neurologic deficits are appreciated.  Skin:  Skin is warm, dry and intact. No rash noted. Psychiatric: Mood and affect are normal. Speech and behavior are  normal.  ____________________________________________   LABS (all labs ordered are listed, but only abnormal results are displayed)  Labs Reviewed  COMPREHENSIVE METABOLIC PANEL - Abnormal; Notable for the following components:      Result Value   Glucose, Bld 104 (*)    AST 13 (*)    Total Bilirubin 0.2 (*)    All other components within normal limits  URINALYSIS, COMPLETE (UACMP) WITH MICROSCOPIC - Abnormal; Notable for the following components:   Color, Urine YELLOW (*)    APPearance CLOUDY (*)    Leukocytes,Ua SMALL (*)    All other components within normal limits  LIPASE, BLOOD  CBC  POC URINE PREG, ED   ____________________________________________  EKG  ED ECG REPORT I, Lavonia Drafts, the attending physician, personally viewed and interpreted this ECG.  Date: 08/09/2019  Rhythm: normal sinus rhythm QRS Axis: normal Intervals: normal ST/T Wave abnormalities: normal Narrative Interpretation: no evidence of acute ischemia  ____________________________________________  RADIOLOGY  Ultrasound negative for cholelithiasis ____________________________________________   PROCEDURES  Procedure(s) performed: No  Procedures   Critical Care performed: No ____________________________________________   INITIAL IMPRESSION / ASSESSMENT AND PLAN / ED COURSE  Pertinent labs & imaging results that were available during my care of the patient were reviewed by me and considered in my medical decision making (see chart for details).  Patient presents with upper abdominal pain, suspicious for cholelithiasis versus PUD, pancreatitis is also possible, pending labs will treat with IM Toradol for suspected cholelithiasis while we await ultrasound  Ultrasound is negative for cholecystitis.  Patient is feeling much better.  Will trial analgesics/PPI, close follow-up with PCP if any worsening back to the ED.    ____________________________________________   FINAL CLINICAL  IMPRESSION(S) / ED DIAGNOSES  Final diagnoses:  Upper abdominal pain        Note:  This document was prepared using Dragon voice recognition software and may include unintentional dictation errors.   Lavonia Drafts, MD 08/09/19 [id number]

## 2019-08-09 NOTE — ED Triage Notes (Signed)
Pt reports epigastric pain and RUQ pain, reports pain started after lunch time reports every time she eats she has diarrhea denies any nausea or vomiting. Pt talks in complete sentences no distress noted

## 2019-09-03 DIAGNOSIS — M542 Cervicalgia: Secondary | ICD-10-CM | POA: Diagnosis not present

## 2019-09-03 DIAGNOSIS — M25511 Pain in right shoulder: Secondary | ICD-10-CM | POA: Diagnosis not present

## 2019-09-25 ENCOUNTER — Encounter: Payer: Self-pay | Admitting: Family Medicine

## 2019-09-25 ENCOUNTER — Ambulatory Visit (INDEPENDENT_AMBULATORY_CARE_PROVIDER_SITE_OTHER): Payer: PPO | Admitting: Family Medicine

## 2019-09-25 ENCOUNTER — Other Ambulatory Visit: Payer: Self-pay

## 2019-09-25 VITALS — BP 122/81 | HR 98 | Temp 98.7°F | Resp 14 | Ht 69.0 in | Wt 200.7 lb

## 2019-09-25 DIAGNOSIS — Z23 Encounter for immunization: Secondary | ICD-10-CM

## 2019-09-25 DIAGNOSIS — E669 Obesity, unspecified: Secondary | ICD-10-CM

## 2019-09-25 DIAGNOSIS — E118 Type 2 diabetes mellitus with unspecified complications: Secondary | ICD-10-CM

## 2019-09-25 DIAGNOSIS — M25569 Pain in unspecified knee: Secondary | ICD-10-CM | POA: Diagnosis not present

## 2019-09-25 NOTE — Progress Notes (Signed)
Impression and Recommendations:    1. Acute knee pain, unspecified laterality   2. Type 2 diabetes mellitus with complication, without long-term current use of insulin (HCC)   3. Obesity (BMI 30-39.9)   4. Need for influenza vaccination      - Need for influenza vaccination today.  Right Knee - Lengthy discussion held with patient today regarding symptoms. - Discussed that running/chasing on uneven ground can lead to joint injury.  - Due to interference with quality of life, discussed options today.  - For conservative care, advised patient to apply ice to the area of pain, 15 minutes every hour or as much as possible, to calm inflammation.  Avoid walking on uneven ground, avoid kneeling or deep bending, avoid other triggering activities or otherwise putting excess stress or strain on the area.  - Reviewed prudent use of tylenol and NSAID's for pain relief.  - Otherwise, discussed referral to Sports Medicine for specialist intervention. - Reviewed that Sports Medicine can provide further access to physical therapy and imaging such as MRI.  - Per patient, has an appointment with Orthopedics tomorrow. - Advised patient to have Orthopedics assess the area PRN. - Patient knows to tell her specialist to have results sent to clinic here.  - Will continue to monitor closely.  BMI Counseling - Body mass index is 29.64 kg/m Explained to patient what BMI refers to, and what it means medically.    Told patient to think about it as a "medical risk stratification measurement" and how increasing BMI is associated with increasing risk/ or worsening state of various diseases such as hypertension, hyperlipidemia, diabetes, premature OA, depression etc.  American Heart Association guidelines for healthy diet, basically Mediterranean diet, and exercise guidelines of 30 minutes 5 days per week or more discussed in detail.  - Extensive discussion held about eating the right foods and  adequate nutrition.  - Discussed that patient and her husband may always follow up with [facility].  - Patient's weight loss goal is to lose 20 more lbs.  Health counseling performed.  All questions answered.  Lifestyle & Preventative Health Maintenance - Advised patient to continue working toward exercising to improve overall mental, physical, and emotional health.    - Reviewed the "spokes of the wheel" of mood and health management.  Stressed the importance of ongoing prudent habits, including regular exercise, appropriate sleep hygiene, healthful dietary habits, and prayer/meditation to relax.  - Encouraged patient to engage in daily physical activity as tolerated, especially a formal exercise routine.  Recommended that the patient eventually strive for at least 150 minutes of moderate cardiovascular activity per week according to guidelines established by the [facility].   - Healthy dietary habits encouraged, including low-carb, and high amounts of lean protein in diet.   - Patient should also consume adequate amounts of water.  Recommendations - Discussed need for return for CPE and full fasting lab work as advised.   Orders Placed This Encounter  Procedures  . Flu Vaccine QUAD 36+ mos IM    Medications Discontinued During This Encounter  Medication Reason  . amitriptyline (ELAVIL) 10 MG tablet Dose change     Gross side effects, risk and benefits, and alternatives of medications and treatment plan in general discussed with patient.  Patient is aware that all medications have potential side effects and we are unable to predict every side effect or drug-drug interaction that may occur.   Patient will call with any questions  prior to using medication if they have concerns.    Expresses verbal understanding and consents to current therapy and treatment regimen.  No barriers to understanding were identified.  Red flag symptoms and signs discussed in detail.  Patient  expressed understanding regarding what to do in case of emergency\urgent symptoms  Please see AVS handed out to patient at the end of our visit for further patient instructions/ counseling done pertaining to today's office visit.   Return for near future full set FBW and CPE, otherwise q 47moDM, Bp etc.     Note:  This note was prepared with assistance of Dragon voice recognition software. Occasional wrong-word or sound-a-like substitutions may have occurred due to the inherent limitations of voice recognition software.   This document serves as a record of services personally performed by DMellody Dance DO. It was created on her behalf by KToni Amend a trained medical scribe. The creation of this record is based on the scribe's personal observations and the provider's statements to them.   This case required medical decision making of at least moderate complexity. The above documentation has been reviewed to be accurate and was completed by DMarjory Sneddon D.O.     --------------------------------------------------------------------------------------------------------------------------------------------------------------------------------------------------------------------------------------------    Subjective:     HPI: AYISELLE BABICHis a 38y.o. [facility] as [facility].  - Weight Loss Thinks she's lost 40+ lbs since she started trying to lose it.  Notes she quit drinking soda.  She's "eating the same, if I want it, I eat it, but I'm not eating at 10 o'clock at night because I was getting heartburn."  Notes "I am walking a little bit, but with this knee [pain] and since my back has had a little bit of an issue, I had to calm down on the walking."  Notes she and her husband always eat grilled or air-fried foods, "never fried," and her husband has also been trying to lose weight and keep his A1c  under better control.  - Right Knee Pain "Feels like I've got a football in my knee." Notes "the knee is bigger over here, a little bit bigger over here." Describes the area as "real tight," and hurting a "little bit" during exam.  Think the pain began in part after she was outside trying to catch cats.  Otherwise says "there's no telling how it started."  Denies injury. Says "I didn't do anything; [the knee] just would not stop popping." Says "until it pops, it doesn't really hurt too bad unless I'm walking on it."  Patient states she tried icing the area once and it was "too much."  Confirms her knee was feeling unstable even before this.  Says she would get up and walk to the bathroom and "it would pop all the way to the bathroom."  States repeatedly that she has been hearing a "cracking and popping" from the joint in question.  Walked yesterday with her mom and the dog at the park with the kids.  Notes she did go up and down hills at the park, but "the good news is, it doesn't hurt worse or less."   Wt Readings from Last 3 Encounters:  09/25/19 200 lb 11.2 oz (91 kg)  08/09/19 198 lb (89.8 kg)  06/12/19 205 lb (93 kg)   BP Readings from Last 3 Encounters:  09/25/19 122/81  08/09/19 127/69  05/23/19 138/77   Pulse Readings from  Last 3 Encounters:  09/25/19 98  08/09/19 84  05/23/19 72   BMI Readings from Last 3 Encounters:  09/25/19 29.64 kg/m  08/09/19 29.24 kg/m  06/12/19 30.27 kg/m     Patient Care Team    Relationship Specialty Notifications Start End  [name], DO PCP - General Family Medicine  09/29/16   Pedro Earls, MD Attending Physician Family Medicine  09/29/16   Carol Ada, MD Consulting Physician Gastroenterology  06/19/17   Marcial Pacas, MD Consulting Physician Neurology  08/24/17   Willia Craze, NP Nurse Practitioner Gastroenterology  04/06/18   Ob/Gyn, [facility]    07/24/18      Patient Active Problem List   Diagnosis Date  Noted  . Hypertension associated with diabetes ([place]) 06/12/2019    Priority: High  . Mood disorder ([place]) 06/12/2019    Priority: High  . Type 2 diabetes mellitus with complication, without long-term current use of insulin ([place]) 12/29/2015    Priority: High  . Obesity (BMI 30-39.9) 02/17/2014    Priority: High  . Osteoarthritis of spine with radiculopathy, lumbar region 09/04/2018    Priority: Medium  . Acute folliculitis- likely due to MRSA, they are draining 06/19/2017    Priority: Medium  . Personal history of MRSA (methicillin resistant Staphylococcus aureus) 06/19/2017    Priority: Medium  . Environmental and seasonal allergies 09/29/2016    Priority: Medium  . GERD (gastroesophageal reflux disease) 09/29/2016    Priority: Medium  . Bipolar 1 disorder - Anxiety 09/28/2016    Priority: Medium  . Fatty liver 09/05/2014    Priority: Medium  . Chronic diarrhea  06/19/2017    Priority: Low  . Contact dermatitis and eczema- due to tape\ Band-Aid 06/19/2017    Priority: Low  . Fibromyalgia 08/11/2016    Priority: Low  . OSA on CPAP 12/08/2014    Priority: Low  . Obstructive sleep apnea syndrome, moderate 11/24/2014    Priority: Low  . S/P abdominal hysterectomy 08/15/2014    Priority: Low  . Ureteral calculus 05/20/2019  . Renal colic 10/62/6948  . Hydronephrosis with urinary obstruction due to ureteral calculus 05/20/2019  . Irritability and anger 03/06/2019  . Heartburn 03/04/2019  . Chronic rhinitis 03/04/2019  . Adverse food reaction 03/04/2019  . Globus sensation 02/28/2019  . Finger pain, left 01/01/2019  . Allergy history unknown- possible throat closure sensation to certain foods 54/62/7035  . Neuropathic pain 09/04/2018  . Chronic radicular pain of lower back 09/04/2018  . Lumbar foraminal stenosis-L5-S1 09/04/2018  . Obesity, Class I, BMI 30-34.9 09/04/2018  . Piriformis syndrome, left 07/12/2018  . Single episode of elevated blood pressure 07/12/2018  .  Hypertriglyceridemia 04/11/2018  . Low level of high density lipoprotein (HDL) 04/11/2018  . Vitamin D insufficiency 04/11/2018  . Epigastric abdominal pain with N 04/06/2018  . History of vitamin D deficiency 04/06/2018  . Spondylosis without myelopathy or radiculopathy, lumbar region 10/03/2017  . Abdominal pain, LUQ 05/02/2017  . Abdominal pain, LLQ 05/02/2017  . Hx of diarrhea 05/02/2017  . Bladder dysfunction 04/30/2014  . Nephrolithiasis 03/04/2014  . Agenesis, corpus callosum ([place]) 02/17/2014  . GAD (generalized anxiety disorder) 02/17/2014  . History of blood transfusion 02/17/2014    Past Medical history, Surgical history, Family history, Social history, Allergies and Medications have been entered into the medical record, reviewed and changed as needed.    Current Meds  Medication Sig  . amitriptyline (ELAVIL) 10 MG tablet Take 30 mg by mouth.  Marland Kitchen  blood glucose meter kit and supplies Dispense based on patient and insurance preference. Use to check blood glucose levels fasting in the morning and 2 hours after largest meal daily.  (FOR ICD-10 E10.9, E11.9).  Marland Kitchen FLUoxetine (PROZAC) 20 MG tablet 1 tablet daily (Patient taking differently: Take 20 mg by mouth daily. )  . liraglutide (VICTOZA) 18 MG/3ML SOPN 1.'2mg'$  [place] qd (Patient taking differently: Inject 1.2 mg into the skin daily. )  . oxybutynin (DITROPAN) 5 MG tablet 1 tab tid prn frequency,urgency, bladder spasm  . pantoprazole (PROTONIX) 20 MG tablet Take 1 tablet (20 mg total) by mouth daily.  . pregabalin (LYRICA) 75 MG capsule Take 75 mg by mouth 2 (two) times daily.  . traMADol (ULTRAM) 50 MG tablet Take 1 tablet (50 mg total) by mouth every 6 (six) hours as needed.  . Turmeric 500 MG TABS Take 500 mg by mouth daily.  . valACYclovir (VALTREX) 500 MG tablet Take 500 mg by mouth daily as needed (cold sores).     Allergies:  Allergies  Allergen Reactions  . Sulfa Antibiotics Anaphylaxis and Swelling  . Topiramate Swelling     Tongue  . Duloxetine Hcl Other (See Comments)    Weight gain, feels "crazy"  . Ibuprofen     Due to taking Elavil, causes counter headaches when she takes Ibuprofen  . Metformin And Related Other (See Comments)    GI Upset  . Penicillins Rash    Has patient had a PCN reaction causing immediate rash, facial/tongue/throat swelling, SOB or lightheadedness with hypotension: Yes Has patient had a PCN reaction causing severe rash involving mucus membranes or skin necrosis: No Has patient had a PCN reaction that required hospitalization No Has patient had a PCN reaction occurring within the last 10 years: No If all of the above answers are "NO", then may proceed with Cephalosporin use.      Review of Systems:  A fourteen system review of systems was performed and found to be positive as per HPI.   Objective:   Blood pressure 122/81, pulse 98, temperature 98.7 F (37.1 C), resp. rate 14, height '5\' 9"'$  (1.753 m), weight 200 lb 11.2 oz (91 kg), last menstrual period 08/05/2014, SpO2 97 %. Body mass index is 29.64 kg/m. General:  Well Developed, well nourished, appropriate for stated age.  Neuro:  Alert and oriented,  extra-ocular muscles intact  HEENT:  Normocephalic, atraumatic, neck supple, no carotid bruits appreciated  Skin:  no gross rash, warm, pink. Cardiac:  RRR, S1 S2 Respiratory:  ECTA B/L and A/P, Not using accessory muscles, speaking in full sentences- unlabored. Vascular:  Ext warm, no cyanosis apprec.; cap RF less 2 sec. Psych:  No HI/SI, judgement and insight good, Euthymic mood. Full Affect.  Knee:  Patient with increased warmth and peri-patellar swelling to the right knee in the lateral superior inferior medial aspect.  Normal to inspection with no erythema or effusion or obvious bony abnormalities. Palpation normal with no warmth, joint line tenderness, patellar tenderness, or condyle tenderness. ROM full in flexion and extension and lower leg rotation. Ligaments  with solid endpoint including ACL, PCL, LCL, MCL. Negative Mcmurray's, Appley's. Non painful patellar compression. Patellar glide without crepitus. Patellar and quadriceps tendons unremarkable. Hamstring and quadriceps strength is normal.

## 2019-09-26 DIAGNOSIS — M25561 Pain in right knee: Secondary | ICD-10-CM | POA: Diagnosis not present

## 2019-09-27 ENCOUNTER — Other Ambulatory Visit: Payer: Self-pay

## 2019-09-27 ENCOUNTER — Other Ambulatory Visit: Payer: PPO

## 2019-09-27 DIAGNOSIS — Z0000 Encounter for general adult medical examination without abnormal findings: Secondary | ICD-10-CM

## 2019-10-03 DIAGNOSIS — M25561 Pain in right knee: Secondary | ICD-10-CM | POA: Diagnosis not present

## 2019-10-07 DIAGNOSIS — M25561 Pain in right knee: Secondary | ICD-10-CM | POA: Diagnosis not present

## 2019-10-08 ENCOUNTER — Other Ambulatory Visit: Payer: PPO

## 2019-10-08 ENCOUNTER — Other Ambulatory Visit: Payer: Self-pay

## 2019-10-08 DIAGNOSIS — M797 Fibromyalgia: Secondary | ICD-10-CM

## 2019-10-08 DIAGNOSIS — E669 Obesity, unspecified: Secondary | ICD-10-CM

## 2019-10-08 DIAGNOSIS — E559 Vitamin D deficiency, unspecified: Secondary | ICD-10-CM | POA: Diagnosis not present

## 2019-10-08 DIAGNOSIS — Z0000 Encounter for general adult medical examination without abnormal findings: Secondary | ICD-10-CM | POA: Diagnosis not present

## 2019-10-08 DIAGNOSIS — E118 Type 2 diabetes mellitus with unspecified complications: Secondary | ICD-10-CM

## 2019-10-08 DIAGNOSIS — K760 Fatty (change of) liver, not elsewhere classified: Secondary | ICD-10-CM

## 2019-10-08 DIAGNOSIS — Q040 Congenital malformations of corpus callosum: Secondary | ICD-10-CM

## 2019-10-08 DIAGNOSIS — F411 Generalized anxiety disorder: Secondary | ICD-10-CM

## 2019-10-08 DIAGNOSIS — E781 Pure hyperglyceridemia: Secondary | ICD-10-CM

## 2019-10-09 ENCOUNTER — Telehealth: Payer: Self-pay | Admitting: Family Medicine

## 2019-10-09 LAB — HEMOGLOBIN A1C
Est. average glucose Bld gHb Est-mCnc: 151 mg/dL
Hgb A1c MFr Bld: 6.9 % — ABNORMAL HIGH (ref 4.8–5.6)

## 2019-10-09 LAB — T3: T3, Total: 138 ng/dL (ref 71–180)

## 2019-10-09 NOTE — Telephone Encounter (Signed)
Patient called asking for update on [facility] clear. Patient came in for labs earlier this week and needed an A1C as part of the sign off but she states the office has not received this form back from [place].

## 2019-10-10 ENCOUNTER — Other Ambulatory Visit: Payer: Self-pay

## 2019-10-10 ENCOUNTER — Telehealth: Payer: Self-pay | Admitting: Family Medicine

## 2019-10-10 ENCOUNTER — Other Ambulatory Visit: Payer: PPO

## 2019-10-10 DIAGNOSIS — M797 Fibromyalgia: Secondary | ICD-10-CM | POA: Diagnosis not present

## 2019-10-10 DIAGNOSIS — E118 Type 2 diabetes mellitus with unspecified complications: Secondary | ICD-10-CM

## 2019-10-10 DIAGNOSIS — E781 Pure hyperglyceridemia: Secondary | ICD-10-CM | POA: Diagnosis not present

## 2019-10-10 DIAGNOSIS — K760 Fatty (change of) liver, not elsewhere classified: Secondary | ICD-10-CM

## 2019-10-10 DIAGNOSIS — E669 Obesity, unspecified: Secondary | ICD-10-CM | POA: Diagnosis not present

## 2019-10-10 DIAGNOSIS — Q040 Congenital malformations of corpus callosum: Secondary | ICD-10-CM | POA: Diagnosis not present

## 2019-10-10 DIAGNOSIS — E559 Vitamin D deficiency, unspecified: Secondary | ICD-10-CM

## 2019-10-10 DIAGNOSIS — F411 Generalized anxiety disorder: Secondary | ICD-10-CM | POA: Diagnosis not present

## 2019-10-10 NOTE — Telephone Encounter (Signed)
Paper work filled out and given to [employer] to fax back to [employer]. AS< CMA

## 2019-10-10 NOTE — Telephone Encounter (Signed)
Spoke with patient to ask questions regarding elective surgery at [facility]  Patient denies SOB with physical activities such as walking up hill, carrying groceries up stairs or having sex.   AS, CMA

## 2019-10-10 NOTE — Telephone Encounter (Signed)
Paperwork was signed and faxed back to [facility] for Dr. French Ana

## 2019-10-10 NOTE — Telephone Encounter (Signed)
Patient also declines any side effects to anesthesia in the past.  She denied family history of neuroleptic malignant syndrome or any known side effects to anesthesia.     Patient recently has lost 20+ pounds and also was exercising avidly prior to injuring her knee, w/o sx.   She is in good cardiovascular shape and I will approve her to go for surgery for a right knee arthroscopy.

## 2019-10-11 LAB — COMPREHENSIVE METABOLIC PANEL
ALT: 19 IU/L (ref 0–32)
AST: 18 IU/L (ref 0–40)
Albumin/Globulin Ratio: 1.6 (ref 1.2–2.2)
Albumin: 4.3 g/dL (ref 3.8–4.8)
Alkaline Phosphatase: 87 IU/L (ref 39–117)
BUN/Creatinine Ratio: 18 (ref 9–23)
BUN: 11 mg/dL (ref 6–24)
Bilirubin Total: 0.2 mg/dL (ref 0.0–1.2)
CO2: 25 mmol/L (ref 20–29)
Calcium: 9.1 mg/dL (ref 8.7–10.2)
Chloride: 100 mmol/L (ref 96–106)
Creatinine, Ser: 0.62 mg/dL (ref 0.57–1.00)
GFR calc Af Amer: 130 mL/min/{1.73_m2} (ref >59)
GFR calc non Af Amer: 113 mL/min/{1.73_m2} (ref >59)
Globulin, Total: 2.7 g/dL (ref 1.5–4.5)
Glucose: 113 mg/dL — ABNORMAL HIGH (ref 65–99)
Potassium: 4.5 mmol/L (ref 3.5–5.2)
Sodium: 137 mmol/L (ref 134–144)
Total Protein: 7 g/dL (ref 6.0–8.5)

## 2019-10-11 LAB — CBC WITH DIFFERENTIAL/PLATELET
Basophils Absolute: 0 10*3/uL (ref 0.0–0.2)
Basos: 1 %
EOS (ABSOLUTE): 0.1 10*3/uL (ref 0.0–0.4)
Eos: 2 %
Hematocrit: 40.2 % (ref 34.0–46.6)
Hemoglobin: 13.3 g/dL (ref 11.1–15.9)
Immature Grans (Abs): 0 10*3/uL (ref 0.0–0.1)
Immature Granulocytes: 0 %
Lymphocytes Absolute: 2.1 10*3/uL (ref 0.7–3.1)
Lymphs: 31 %
MCH: 27.4 pg (ref 26.6–33.0)
MCHC: 33.1 g/dL (ref 31.5–35.7)
MCV: 83 fL (ref 79–97)
Monocytes Absolute: 0.4 10*3/uL (ref 0.1–0.9)
Monocytes: 6 %
Neutrophils Absolute: 4 10*3/uL (ref 1.4–7.0)
Neutrophils: 60 %
Platelets: 195 10*3/uL (ref 150–450)
RBC: 4.85 x10E6/uL (ref 3.77–5.28)
RDW: 12.9 % (ref 11.7–15.4)
WBC: 6.7 10*3/uL (ref 3.4–10.8)

## 2019-10-11 LAB — LIPID PANEL
Chol/HDL Ratio: 3 ratio (ref 0.0–4.4)
Cholesterol, Total: 133 mg/dL (ref 100–199)
HDL: 44 mg/dL (ref >39)
LDL Chol Calc (NIH): 62 mg/dL (ref 0–99)
Triglycerides: 161 mg/dL — ABNORMAL HIGH (ref 0–149)
VLDL Cholesterol Cal: 27 mg/dL (ref 5–40)

## 2019-10-11 LAB — HEMOGLOBIN A1C
Est. average glucose Bld gHb Est-mCnc: 148 mg/dL
Hgb A1c MFr Bld: 6.8 % — ABNORMAL HIGH (ref 4.8–5.6)

## 2019-10-11 LAB — T4, FREE: Free T4: 1.39 ng/dL (ref 0.82–1.77)

## 2019-10-11 LAB — VITAMIN D 25 HYDROXY (VIT D DEFICIENCY, FRACTURES): Vit D, 25-Hydroxy: 23.5 ng/mL — ABNORMAL LOW (ref 30.0–100.0)

## 2019-10-11 LAB — TSH: TSH: 0.73 u[IU]/mL (ref 0.450–4.500)

## 2019-10-15 ENCOUNTER — Other Ambulatory Visit: Payer: Self-pay | Admitting: Family Medicine

## 2019-10-15 DIAGNOSIS — E559 Vitamin D deficiency, unspecified: Secondary | ICD-10-CM

## 2019-10-15 MED ORDER — VITAMIN D (ERGOCALCIFEROL) 1.25 MG (50000 UNIT) PO CAPS: ORAL_CAPSULE | ORAL | 3 refills | Status: DC

## 2019-10-15 NOTE — Progress Notes (Signed)
Ergocalciferol sent into pharmacy

## 2019-10-16 DIAGNOSIS — M2241 Chondromalacia patellae, right knee: Secondary | ICD-10-CM | POA: Diagnosis not present

## 2019-10-16 DIAGNOSIS — M24661 Ankylosis, right knee: Secondary | ICD-10-CM | POA: Diagnosis not present

## 2019-10-16 DIAGNOSIS — M25361 Other instability, right knee: Secondary | ICD-10-CM | POA: Diagnosis not present

## 2019-10-21 DIAGNOSIS — M25561 Pain in right knee: Secondary | ICD-10-CM | POA: Diagnosis not present

## 2019-10-21 DIAGNOSIS — S83011D Lateral subluxation of right patella, subsequent encounter: Secondary | ICD-10-CM | POA: Diagnosis not present

## 2019-10-21 DIAGNOSIS — M2241 Chondromalacia patellae, right knee: Secondary | ICD-10-CM | POA: Diagnosis not present

## 2019-10-21 DIAGNOSIS — M6281 Muscle weakness (generalized): Secondary | ICD-10-CM | POA: Diagnosis not present

## 2019-10-24 DIAGNOSIS — M2241 Chondromalacia patellae, right knee: Secondary | ICD-10-CM | POA: Diagnosis not present

## 2019-10-25 DIAGNOSIS — S83011D Lateral subluxation of right patella, subsequent encounter: Secondary | ICD-10-CM | POA: Diagnosis not present

## 2019-10-25 DIAGNOSIS — M2241 Chondromalacia patellae, right knee: Secondary | ICD-10-CM | POA: Diagnosis not present

## 2019-10-25 DIAGNOSIS — M6281 Muscle weakness (generalized): Secondary | ICD-10-CM | POA: Diagnosis not present

## 2019-10-25 DIAGNOSIS — M25561 Pain in right knee: Secondary | ICD-10-CM | POA: Diagnosis not present

## 2019-10-30 DIAGNOSIS — S83011D Lateral subluxation of right patella, subsequent encounter: Secondary | ICD-10-CM | POA: Diagnosis not present

## 2019-10-30 DIAGNOSIS — M6281 Muscle weakness (generalized): Secondary | ICD-10-CM | POA: Diagnosis not present

## 2019-10-30 DIAGNOSIS — M2241 Chondromalacia patellae, right knee: Secondary | ICD-10-CM | POA: Diagnosis not present

## 2019-10-30 DIAGNOSIS — M25561 Pain in right knee: Secondary | ICD-10-CM | POA: Diagnosis not present

## 2019-11-04 DIAGNOSIS — M25561 Pain in right knee: Secondary | ICD-10-CM | POA: Diagnosis not present

## 2019-11-04 DIAGNOSIS — M6281 Muscle weakness (generalized): Secondary | ICD-10-CM | POA: Diagnosis not present

## 2019-11-04 DIAGNOSIS — S83011D Lateral subluxation of right patella, subsequent encounter: Secondary | ICD-10-CM | POA: Diagnosis not present

## 2019-11-04 DIAGNOSIS — M2241 Chondromalacia patellae, right knee: Secondary | ICD-10-CM | POA: Diagnosis not present

## 2019-11-06 DIAGNOSIS — S83011D Lateral subluxation of right patella, subsequent encounter: Secondary | ICD-10-CM | POA: Diagnosis not present

## 2019-11-06 DIAGNOSIS — M25561 Pain in right knee: Secondary | ICD-10-CM | POA: Diagnosis not present

## 2019-11-06 DIAGNOSIS — M6281 Muscle weakness (generalized): Secondary | ICD-10-CM | POA: Diagnosis not present

## 2019-11-06 DIAGNOSIS — M2241 Chondromalacia patellae, right knee: Secondary | ICD-10-CM | POA: Diagnosis not present

## 2019-11-11 DIAGNOSIS — M25561 Pain in right knee: Secondary | ICD-10-CM | POA: Diagnosis not present

## 2019-11-11 DIAGNOSIS — S83011D Lateral subluxation of right patella, subsequent encounter: Secondary | ICD-10-CM | POA: Diagnosis not present

## 2019-11-11 DIAGNOSIS — M6281 Muscle weakness (generalized): Secondary | ICD-10-CM | POA: Diagnosis not present

## 2019-11-11 DIAGNOSIS — M2241 Chondromalacia patellae, right knee: Secondary | ICD-10-CM | POA: Diagnosis not present

## 2019-11-18 ENCOUNTER — Encounter: Payer: PPO | Admitting: Family Medicine

## 2019-11-18 DIAGNOSIS — M6281 Muscle weakness (generalized): Secondary | ICD-10-CM | POA: Diagnosis not present

## 2019-11-18 DIAGNOSIS — M25561 Pain in right knee: Secondary | ICD-10-CM | POA: Diagnosis not present

## 2019-11-18 DIAGNOSIS — M2241 Chondromalacia patellae, right knee: Secondary | ICD-10-CM | POA: Diagnosis not present

## 2019-11-18 DIAGNOSIS — S83011D Lateral subluxation of right patella, subsequent encounter: Secondary | ICD-10-CM | POA: Diagnosis not present

## 2019-11-20 DIAGNOSIS — M25561 Pain in right knee: Secondary | ICD-10-CM | POA: Diagnosis not present

## 2019-11-20 DIAGNOSIS — M6281 Muscle weakness (generalized): Secondary | ICD-10-CM | POA: Diagnosis not present

## 2019-11-20 DIAGNOSIS — M2241 Chondromalacia patellae, right knee: Secondary | ICD-10-CM | POA: Diagnosis not present

## 2019-11-20 DIAGNOSIS — S83011D Lateral subluxation of right patella, subsequent encounter: Secondary | ICD-10-CM | POA: Diagnosis not present

## 2019-11-21 DIAGNOSIS — M2241 Chondromalacia patellae, right knee: Secondary | ICD-10-CM | POA: Diagnosis not present

## 2019-12-12 ENCOUNTER — Encounter: Payer: PPO | Admitting: Family Medicine

## 2019-12-16 ENCOUNTER — Other Ambulatory Visit: Payer: Self-pay | Admitting: Family Medicine

## 2019-12-16 DIAGNOSIS — F411 Generalized anxiety disorder: Secondary | ICD-10-CM

## 2019-12-16 DIAGNOSIS — R454 Irritability and anger: Secondary | ICD-10-CM

## 2019-12-17 ENCOUNTER — Telehealth: Payer: Self-pay | Admitting: Family Medicine

## 2019-12-17 NOTE — Telephone Encounter (Signed)
This med refill request was handled via rx request. AS, CMA

## 2019-12-17 NOTE — Telephone Encounter (Signed)
Patient is requesting a refill of her fluoxetine, if approved please send to [facility]

## 2019-12-19 DIAGNOSIS — M2241 Chondromalacia patellae, right knee: Secondary | ICD-10-CM | POA: Diagnosis not present

## 2019-12-27 ENCOUNTER — Encounter: Payer: Self-pay | Admitting: Family Medicine

## 2019-12-27 ENCOUNTER — Ambulatory Visit (INDEPENDENT_AMBULATORY_CARE_PROVIDER_SITE_OTHER): Payer: PPO | Admitting: Family Medicine

## 2019-12-27 ENCOUNTER — Other Ambulatory Visit: Payer: Self-pay

## 2019-12-27 ENCOUNTER — Ambulatory Visit: Payer: PPO | Admitting: Family Medicine

## 2019-12-27 VITALS — BP 123/82 | HR 83 | Temp 98.6°F | Resp 12 | Ht 69.0 in | Wt 210.7 lb

## 2019-12-27 DIAGNOSIS — B9562 Methicillin resistant Staphylococcus aureus infection as the cause of diseases classified elsewhere: Secondary | ICD-10-CM

## 2019-12-27 DIAGNOSIS — Z22322 Carrier or suspected carrier of Methicillin resistant Staphylococcus aureus: Secondary | ICD-10-CM | POA: Diagnosis not present

## 2019-12-27 DIAGNOSIS — L0390 Cellulitis, unspecified: Secondary | ICD-10-CM

## 2019-12-27 MED ORDER — MUPIROCIN 2 % EX OINT: TOPICAL_OINTMENT | CUTANEOUS | 0 refills | Status: DC

## 2019-12-27 NOTE — Patient Instructions (Signed)
MRSA Infection, Self-Care, Adult Methicillin-resistant Staphylococcus aureus (MRSA) infection is caused by bacteria that no longer respond to antibiotic medicines. MRSA infection can be hard to treat. Self-care is important after being diagnosed with MRSA. Following instructions for self-care will:  Help you heal well.  Prevent the infection from spreading to others. Your health care provider may also give you more specific instructions. If you have problems or questions, contact your health care provider. What are the risks? MRSA can be on the skin or in the nose of many people without causing problems. This is called MRSA colonization. However, MRSA can cause problems for you and others if it enters the body through a cut, a sore, or an invasive medical procedure or device.  If MRSA enters your body, it may cause serious problems, such as: ? Skin infections. ? Bone or joint infections. ? Pneumonia. ? Bloodstream infections (sepsis).  If you have these infections, MRSA may spread to others, including: ? Visitors or other patients in the hospital. ? Friends, family, or other people at home or in your community. Supplies needed:  Soap and water.  Germ-free (sterile) dressing.  Alcohol-based hand sanitizer (optional).  Home cleaning solutions that contain bleach.  Clean or disposable towels. How to prevent MRSA infections Take these steps to avoid getting another MRSA infection and to prevent the bacteria from spreading to others. Hand washing   Wash your hands frequently with soap and water. If soap and water are not available, use an alcohol-based hand sanitizer. Dry your hands with a clean or disposable towel.  Make sure that everyone in the household washes their hands often. Wound care  If you have a wound, follow instructions from your health care provider about how to take care of your wound. Make sure you:  Wash your hands with soap and water before and after you  change your bandage (dressing). If soap and water are not available, use an alcohol-based hand sanitizer.  Change your dressing as told by your health care provider.  Leave any stitches (sutures), skin glue, or adhesive strips in place. These skin closures may need to be in place for 2 weeks or longer. If adhesive strip edges start to loosen and curl up, you may trim the loose edges. Do not remove adhesive strips completely unless your health care provider tells you to do that.  Clean wounds, cuts, and abrasions with soap and water and cover them with dry, sterile dressings until they heal.  Check your wound every day for signs of infection. Check for: ? More redness, swelling, or pain. ? More fluid or blood. ? Warmth. ? Pus or a bad smell.  If you have a wound that seems to be infected, ask your health care provider if a culture should be done for MRSA and other bacteria. Personal hygiene  Maintain good hygiene by bathing often and keeping your body clean.  Wash hands before preparing food.  Always shower after exercising.  Wash towels, bedding, and clothes in the washing machine with detergent and hot water. Dry them in a hot dryer. General tips  Take your antibiotic medicine as told by your health care provider. Take them only when absolutely necessary. Do not stop taking the antibiotic even if you start to feel better.  Avoid close contact with others as much as possible.  Do not use towels, razors, toothbrushes, bedding, or other items that will be used by others.  Clean surfaces regularly to remove germs (disinfection). Use products or solutions  that contain bleach. Make sure you disinfect bathroom surfaces, food preparation areas, and doorknobs. Follow these instructions at home:  Take over-the-counter and prescription medicines only as told by your health care provider.  Tell all health care providers who care for you that you have MRSA or that you have had MRSA.  If  you are breastfeeding, talk to your health care provider about MRSA. You may be asked to temporarily stop breastfeeding.  If you have an invasive medical device, make sure that you know how to take care of it to prevent infection.  Keep all follow-up visits as told by your health care provider. This is important. Contact a health care provider if:  Your infection seems to be getting worse. Signs may include: ? Warmth, redness, or tenderness around your wound site. ? A red line that spreads from your infection site. ? A dark color in the area around your infection. ? Wound drainage that is tan, yellow, or green. ? A bad smell coming from your wound.  You have symptoms of a new MRSA infection: ? A pus-filled pimple. ? A boil on your skin. ? Pus draining from your skin. ? A sore (abscess) under your skin or somewhere in your body. ? Fever with or without chills. Get help right away if:  You have trouble breathing.  You feel nauseous, you vomit, or you cannot take medicine without vomiting.  You have chest pain. These symptoms may represent a serious problem that is an emergency. Do not wait to see if the symptoms will go away. Get medical help right away. Call your local emergency services (911 in the [place] Do not drive yourself to the hospital. Summary  Self-care is important after being diagnosed with MRSA. This will help you heal well and prevent the infection from spreading to others.  Wash your hands often, avoid close contact with others, and avoid sharing towels, razors, toothbrushes, and bedding with other people. This will help prevent the infection from spreading to others.  If you are being treated for a MRSA infection, make sure to take over-the-counter and prescription medicines as told. Finish all antibiotic medicine even if you start to feel better.  If you have a wound, follow instructions from your health care provider about how to take care of it. Check your wound  every day for signs of infection. This information is not intended to replace advice given to you by your health care provider. Make sure you discuss any questions you have with your health care provider. Document Revised: 02/07/2019 Document Reviewed: 02/08/2019 Else[employer] Patient Education  [place] Infection, Diagnosis, Adult Methicillin-resistant Staphylococcus aureus (MRSA) infection is caused by bacteria called Staphylococcus aureus, or staph, that no longer respond to common antibiotic medicines (drug-resistant bacteria). MRSA infection can be hard to treat. Most of the time, MRSA can be on the skin or in the nose without causing problems (colonized). However, if MRSA enters the body through a cut, a sore, or an invasive medical device, it can cause a serious infection. What are the causes? This condition is caused by staph bacteria. Illness may develop after exposure to the bacteria through:  Skin-to-skin contact with someone who is infected with MRSA.  Touching surfaces that have the bacteria on them.  Having a procedure or using equipment that allows MRSA to enter the body.  Having MRSA that lives on your skin and then enters your body through: ? A cut or scratch. ? A surgery  or procedure. ? The use of a medical device. Contact with the bacteria may occur:  During a stay in a hospital, rehabilitation facility, nursing home, or other health care facility (health care-associated MRSA).  In daily activities where there is close contact with others, such as sports, child care centers, or at home (community-associated MRSA). What increases the risk? You are more likely to develop this condition if you:  Have a surgery or procedure.  Have an IV or a thin tube (catheter) placed in your body.  Are elderly.  Are on kidney dialysis.  Have recently taken an antibiotic medicine.  Live in a long-term care facility.  Have a chronic wound or skin ulcer.  Have  a weak body defense system (immune system).  Play sports that involve skin-to-skin contact.  Live in a crowded place, like a dormitory or [employer]  Share towels, razors, or sports equipment with other people.  Have a history of MRSA infection or colonization. What are the signs or symptoms? Symptoms of this condition depend on the area that is affected. Symptoms may include:  A pus-filled pimple or boil.  Pus that drains from your skin.  A sore (abscess) under your skin or somewhere in your body.  Fever with or without chills.  Difficulty breathing.  Coughing up blood.  Redness, warmth, swelling, or pain in the affected area. How is this diagnosed? This condition may be diagnosed based on:  A physical exam.  Your medical history.  Taking a sample from the infected area and growing it in a lab (culture). You may also have other tests, including:  Imaging tests, such as X-rays, a CT scan, or an MRI.  Lab tests, such as blood, urine, or phlegm (sputum) tests. You skin or nose may be swabbed when you are admitted to a health care facility for a procedure. This is to screen for MRSA. How is this treated? Treatment depends on the type of MRSA infection you have and how severe, deep, or extensive it is. Treatment may include:  Antibiotic medicines.  Surgery to drain pus from the infected area. Severe infections may require a hospital stay. Follow these instructions at home: Medicines  Take over-the-counter and prescription medicines only as told by your health care provider.  If you were prescribed an antibiotic medicine, use it as told by your health care provider. Do not stop using the antibiotic even if you start to feel better. Prevention Follow these instructions to avoid spreading the infection to others:  Wash your hands frequently with soap and water. If soap and water are not available, use an alcohol-based hand sanitizer.  Avoid close contact with  those around you as much as possible. Do not use towels, razors, toothbrushes, bedding, or other items that will be used by others.  Wash towels, bedding, and clothes in the washing machine with detergent and hot water. Dry them in a hot dryer.  Clean surfaces regularly to remove germs (disinfection). Use products or solutions that contain bleach. Make sure you disinfect bathroom surfaces, food preparation areas, exercise equipment, and doorknobs.  General instructions  If you have a wound, follow instructions from your health care provider about how to take care of your wound. ? Do not pick at scabs. ? Do not try to drain any infection sites or pimples.  Tell all your health care providers that you have MRSA, or if you have ever had a MRSA infection.  Keep all follow-up visits as told by your health care  provider. This is important. Contact a health care provider if you:  Do not get better.  Have symptoms that get worse.  Have new symptoms. Get help right away if you have:  Nausea or vomiting, or if you cannot take medicine without vomiting.  Trouble breathing.  Chest pain. These symptoms may represent a serious problem that is an emergency. Do not wait to see if the symptoms will go away. Get medical help right away. Call your local emergency services (911 in the [place] Do not drive yourself to the hospital. Summary  MRSA infection is caused by bacteria called Staphylococcus aureus, or staph, that no longer respond to common antibiotic medicines.  Treatment for this condition depends on the type of MRSA infection you have and how severe, deep, and extensive it is.  If you were prescribed an antibiotic medicine, use it as told by your health care provider. Do not stop using the antibiotic even if you start to feel better.  Follow instructions from your health care provider to avoid spreading the infection to others. This information is not intended to replace advice given to  you by your health care provider. Make sure you discuss any questions you have with your health care provider. Document Revised: 02/07/2019 Document Reviewed: 02/08/2019 Elsevier Patient Education  [place].

## 2019-12-27 NOTE — Progress Notes (Signed)
Pt here for an acute care OV today  Impression and Recommendations:    1. MRSA carrier   2. (high risk for) Cellulitis due to MRSA      MRSA Carrier - Cellulitis due to MRSA - Discussed patient's symptoms during appointment today.  -  Viral and bacterial culture of area of concern obtained today using sterile technique with 16G needle to shear surface of pustules and expressed exudate for cx. - Will await results of culture.  - Mupirocin ointment provided today.  See med list.  - Advised the patient to use warm compresses on the area, 10 minutes at a time, several times per day, to increase blood flow and promote healing.  - Prudent wound care discussed with patient today.  Advised the patient to keep the area covered while wearing underwear or pants, to avoid rubbing / abrasion.  Otherwise, keep the area uncovered and open to air.  - Discussed expectations for healing.  If symptoms change, spread, or begin to worsen at all, patient knows to call and let the clinic know ASAP as she may need to begin oral antibiotics.  - Will continue to monitor.    Meds ordered this encounter  Medications  . mupirocin ointment (BACTROBAN) 2 %    Sig: Apply to affected area TID for 10 days.    Dispense:  30 g    Refill:  0    Orders Placed This Encounter  Procedures  . Wound culture     Education and routine counseling performed. Handouts provided  Gross side effects, risk and benefits, and alternatives of medications and treatment plan in general discussed with patient.  Patient is aware that all medications have potential side effects and we are unable to predict every side effect or drug-drug interaction that may occur.   Patient will call with any questions prior to using medication if they have concerns.    Expresses verbal understanding and consents to current therapy and treatment regimen.  No barriers to understanding were identified.  Red flag symptoms and signs discussed  in detail.  Patient expressed understanding regarding what to do in case of emergency\urgent symptoms   Please see AVS handed out to patient at the end of our visit for further patient instructions/ counseling done pertaining to today's office visit.   Return if symptoms worsen or fail to improve in terms of your cellulitis, for Follow-up for chronic medical issues as previously discussed.     Note:  This document was prepared occasionally using Dragon voice recognition software and may include unintentional dictation errors in addition to a scribe.  This document serves as a record of services personally performed by [name], DO. It was created on her behalf by Toni Amend, a trained medical scribe. The creation of this record is based on the scribe's personal observations and the provider's statements to them.   This case required medical decision making of at least moderate complexity. The above documentation has been reviewed to be accurate and was completed by Marjory Sneddon, D.O.    --------------------------------------------------------------------------------------------------------------------------------------------------------------------------------------------------------------------------------------------    Subjective:    Samantha [name], am serving as scribe for Dr. [name].  CC:  Chief Complaint  Patient presents with  . Rash    HPI: Samantha [name] is a 39 y.o. female who presents to [place] [facility] today for issues as discussed below.  - Skin Concern on Buttock Believes her symptoms began on Tuesday this  week, 3 days ago.  Notes she was taking a shower, and her skin has been a little dry, so she changed soaps.  While drying off, she scratched her buttock.  Notes afterward, "these two bumps came up."  Regarding the area of concern, says "it doesn't hurt, doesn't itch, it's just there."  The patient notes  that she has had MRSA before, along with members of her family.  States she has put MRSA medicine on the area of concern, and believes this is why the area has not worsened.    No problems updated.   Wt Readings from Last 3 Encounters:  12/27/19 210 lb 11.2 oz (95.6 kg)  09/25/19 200 lb 11.2 oz (91 kg)  08/09/19 198 lb (89.8 kg)   BP Readings from Last 3 Encounters:  12/27/19 123/82  09/25/19 122/81  08/09/19 127/69   BMI Readings from Last 3 Encounters:  12/27/19 31.11 kg/m  09/25/19 29.64 kg/m  08/09/19 29.24 kg/m     Patient Care Team    Relationship Specialty Notifications Start End  [name], DO PCP - General Family Medicine  09/29/16   Pedro Earls, MD Attending Physician Family Medicine  09/29/16   Carol Ada, MD Consulting Physician Gastroenterology  06/19/17   Marcial Pacas, MD Consulting Physician Neurology  08/24/17   Willia Craze, NP Nurse Practitioner Gastroenterology  04/06/18   Ob/Gyn, [facility]    07/24/18      Patient Active Problem List   Diagnosis Date Noted  . Hypertension associated with diabetes ([place]) 06/12/2019  . Mood disorder ([place]) 06/12/2019  . Type 2 diabetes mellitus with complication, without long-term current use of insulin ([place]) 12/29/2015  . Obesity (BMI 30-39.9) 02/17/2014  . Osteoarthritis of spine with radiculopathy, lumbar region 09/04/2018  . Acute folliculitis- likely due to MRSA, they are draining 06/19/2017  . Personal history of MRSA (methicillin resistant Staphylococcus aureus) 06/19/2017  . Environmental and seasonal allergies 09/29/2016  . GERD (gastroesophageal reflux disease) 09/29/2016  . Bipolar 1 disorder - Anxiety 09/28/2016  . Fatty liver 09/05/2014  . Chronic diarrhea  06/19/2017  . Contact dermatitis and eczema- due to tape\ Band-Aid 06/19/2017  . Fibromyalgia 08/11/2016  . OSA on CPAP 12/08/2014  . Obstructive sleep apnea syndrome, moderate 11/24/2014  . S/P abdominal hysterectomy 08/15/2014    . Ureteral calculus 05/20/2019  . Renal colic 16/06/3709  . Hydronephrosis with urinary obstruction due to ureteral calculus 05/20/2019  . Irritability and anger 03/06/2019  . Heartburn 03/04/2019  . Chronic rhinitis 03/04/2019  . Adverse food reaction 03/04/2019  . Globus sensation 02/28/2019  . Finger pain, left 01/01/2019  . Allergy history unknown- possible throat closure sensation to certain foods 62/69/4854  . Neuropathic pain 09/04/2018  . Chronic radicular pain of lower back 09/04/2018  . Lumbar foraminal stenosis-L5-S1 09/04/2018  . Obesity, Class I, BMI 30-34.9 09/04/2018  . Piriformis syndrome, left 07/12/2018  . Single episode of elevated blood pressure 07/12/2018  . Hypertriglyceridemia 04/11/2018  . Low level of high density lipoprotein (HDL) 04/11/2018  . Vitamin D insufficiency 04/11/2018  . Epigastric abdominal pain with N 04/06/2018  . History of vitamin D deficiency 04/06/2018  . Spondylosis without myelopathy or radiculopathy, lumbar region 10/03/2017  . Abdominal pain, LUQ 05/02/2017  . Abdominal pain, LLQ 05/02/2017  . Hx of diarrhea 05/02/2017  . Bladder dysfunction 04/30/2014  . Nephrolithiasis 03/04/2014  . Agenesis, corpus callosum ([place]) 02/17/2014  . GAD (generalized anxiety disorder) 02/17/2014  . History of blood transfusion 02/17/2014  Past Medical History:  Diagnosis Date  . Anemia   . Angio-edema   . Anxiety   . Complication of anesthesia    slow to wake up  . Depression    rx presc but not taken  . Diabetes mellitus without complication ([place])   . Fibromyalgia   . GERD (gastroesophageal reflux disease)    no meds  . Headache(784.0)    tension and with anxiety hx  . Hepatic steatosis   . History of kidney stones   . MVP (mitral valve prolapse)    echo 10  . Pneumonia    hx  . Seizures ([place])    non epileptic events per epilepsy monitoring unit  . Seizures ([place])    last seizure July 2015- does not convulse but has a sleep  effect  . Sinus infection    recent on antibiotic  . Sleep apnea    has cpap does not use     Past Surgical History:  Procedure Laterality Date  . ABDOMINAL HYSTERECTOMY N/A 08/15/2014   Procedure: HYSTERECTOMY ABDOMINAL WITH CYSTOSCOPY;  Surgeon: Sanjuana Kava, MD;  Location: [facility];  Service: Gynecology;  Laterality: N/A;  . CESAREAN SECTION N/A   . CYSTOSCOPY/RETROGRADE/URETEROSCOPY Left 05/23/2019   Procedure: CYSTOSCOPY/RETROGRADE/URETEROSCOPY;  Surgeon: Abbie Sons, MD;  Location: [facility];  Service: Urology;  Laterality: Left;  . SHOULDER ARTHROSCOPY WITH SUBACROMIAL DECOMPRESSION AND OPEN ROTATOR C Left 11/27/2015   Procedure: LEFT SHOULDER ARTHROSCOPY WITH SUBACROMIAL DECOMPRESSION, MINI OPEN ROTATOR CUFF REPAIR;  Surgeon: Netta Cedars, MD;  Location: [facility];  Service: Orthopedics;  Laterality: Left;  . TUBAL LIGATION       Family History  Problem Relation Age of Onset  . Diabetes Mother   . Hypertension Mother   . Healthy Father   . Breast cancer Maternal Grandmother   . Cancer Maternal Grandfather        brain, lung, liver  . Heart attack Paternal Grandfather      Social History   Socioeconomic History  . Marital status: Married    Spouse name: Not on file  . Number of children: Not on file  . Years of education: Not on file  . Highest education level: Not on file  Occupational History  . Not on file  Tobacco Use  . Smoking status: Never Smoker  . Smokeless tobacco: Never Used  Substance and Sexual Activity  . Alcohol use: No  . Drug use: No  . Sexual activity: Yes    Birth control/protection: Surgical  Other Topics Concern  . Not on file  Social History Narrative  . Not on file   Social Determinants of Health   Financial Resource Strain:   . Difficulty of Paying Living Expenses: Not on file  Food Insecurity:   . Worried About Charity fundraiser in the Last Year: Not on file  . Ran Out of Food in the Last Year: Not on file  Transportation Needs:    . Lack of Transportation (Medical): Not on file  . Lack of Transportation (Non-Medical): Not on file  Physical Activity:   . Days of Exercise per Week: Not on file  . Minutes of Exercise per Session: Not on file  Stress:   . Feeling of Stress : Not on file  Social Connections:   . Frequency of Communication with Friends and Family: Not on file  . Frequency of Social Gatherings with Friends and Family: Not on file  . Attends Religious Services: Not on file  .  Active Member of Clubs or Organizations: Not on file  . Attends Archivist Meetings: Not on file  . Marital Status: Not on file  Intimate Partner Violence:   . Fear of Current or Ex-Partner: Not on file  . Emotionally Abused: Not on file  . Physically Abused: Not on file  . Sexually Abused: Not on file     Current Meds  Medication Sig  . amitriptyline (ELAVIL) 10 MG tablet Take 30 mg by mouth.  . blood glucose meter kit and supplies Dispense based on patient and insurance preference. Use to check blood glucose levels fasting in the morning and 2 hours after largest meal daily.  (FOR ICD-10 E10.9, E11.9).  Marland Kitchen FLUoxetine (PROZAC) 20 MG tablet TAKE 1 TABLET BY MOUTH DAILY  . liraglutide (VICTOZA) 18 MG/3ML SOPN 1.'2mg'$  [place] qd (Patient taking differently: Inject 1.2 mg into the skin daily. )  . oxybutynin (DITROPAN) 5 MG tablet 1 tab tid prn frequency,urgency, bladder spasm  . pantoprazole (PROTONIX) 20 MG tablet Take 1 tablet (20 mg total) by mouth daily.  . pregabalin (LYRICA) 75 MG capsule Take 75 mg by mouth 2 (two) times daily.  . traMADol (ULTRAM) 50 MG tablet Take 1 tablet (50 mg total) by mouth every 6 (six) hours as needed.  . Turmeric 500 MG TABS Take 500 mg by mouth daily.  . valACYclovir (VALTREX) 500 MG tablet Take 500 mg by mouth daily as needed (cold sores).   . Vitamin D, Ergocalciferol, (DRISDOL) 1.25 MG (50000 UT) CAPS capsule Take one tablet wkly    Allergies:  Allergies  Allergen Reactions  . Sulfa  Antibiotics Anaphylaxis and Swelling  . Topiramate Swelling    Tongue  . Duloxetine Hcl Other (See Comments)    Weight gain, feels "crazy"  . Ibuprofen     Due to taking Elavil, causes counter headaches when she takes Ibuprofen  . Metformin And Related Other (See Comments)    GI Upset  . Penicillins Rash    Has patient had a PCN reaction causing immediate rash, facial/tongue/throat swelling, SOB or lightheadedness with hypotension: Yes Has patient had a PCN reaction causing severe rash involving mucus membranes or skin necrosis: No Has patient had a PCN reaction that required hospitalization No Has patient had a PCN reaction occurring within the last 10 years: No If all of the above answers are "NO", then may proceed with Cephalosporin use.      Review of Systems: General:   Denies fever, chills, unexplained weight loss.  Optho/Auditory:   Denies visual changes, blurred vision/LOV Respiratory:   Denies wheeze, DOE more than baseline levels.   Cardiovascular:   Denies chest pain, palpitations, new onset peripheral edema  Gastrointestinal:   Denies nausea, vomiting, diarrhea, abd pain.  Genitourinary: Denies dysuria, freq/ urgency, flank pain or discharge from genitals.  Endocrine:     Denies hot or cold intolerance, polyuria, polydipsia. Musculoskeletal:   Denies unexplained myalgias, joint swelling, unexplained arthralgias, gait problems.  Skin:  Denies new onset rash, suspicious lesions Neurological:     Denies dizziness, unexplained weakness, numbness  Psychiatric/Behavioral:   Denies mood changes, suicidal or homicidal ideations, hallucinations    Objective:   Blood pressure 123/82, pulse 83, temperature 98.6 F (37 C), temperature source Oral, resp. rate 12, height '5\' 9"'$  (1.753 m), weight 210 lb 11.2 oz (95.6 kg), last menstrual period 08/05/2014, SpO2 98 %. Body mass index is 31.11 kg/m. General:  Well Developed, well nourished, appropriate for stated age.  Neuro:  Alert  and oriented,  extra-ocular muscles intact  HEENT:  Normocephalic, atraumatic, neck supple Skin:  half-dollar sized raised erythematous plaque near the very top of the gluteal fold of the left buttock, with 5-6 smaller pustules centrally located on the plaque. No surrounding erythema beyond the lesion, and no evidence of tracking.  Skin otherwise warm, pink. Cardiac:  RRR, S1 S2 Respiratory:  ECTA B/L and A/P, Not using accessory muscles, speaking in full sentences- unlabored. Vascular:  Ext warm, no cyanosis apprec.; cap RF less 2 sec. Psych:  No HI/SI, judgement and insight good, Euthymic mood. Full Affect.

## 2019-12-31 LAB — WOUND CULTURE: Organism ID, Bacteria: NONE SEEN

## 2020-01-06 ENCOUNTER — Telehealth: Payer: Self-pay | Admitting: Family Medicine

## 2020-01-06 NOTE — Telephone Encounter (Signed)
Pt called seeking Provider appt, advised none available till tomorrow , scheduled pt for appt w/ Katy @ 1:45pm  (Dr. Raliegh Scarlet scheduled is fully booked.  --Forwarding information to med asst in case she can review w/ pt if Symptoms more serious than pt described.  --glh

## 2020-01-07 ENCOUNTER — Encounter: Payer: Self-pay | Admitting: Family Medicine

## 2020-01-07 ENCOUNTER — Encounter (INDEPENDENT_AMBULATORY_CARE_PROVIDER_SITE_OTHER): Payer: Medicaid Other | Admitting: Family Medicine

## 2020-01-07 ENCOUNTER — Ambulatory Visit: Payer: PPO | Admitting: Adult Health

## 2020-01-07 ENCOUNTER — Other Ambulatory Visit: Payer: Self-pay

## 2020-01-08 ENCOUNTER — Encounter: Payer: PPO | Admitting: Family Medicine

## 2020-01-09 DIAGNOSIS — M542 Cervicalgia: Secondary | ICD-10-CM | POA: Diagnosis not present

## 2020-01-09 DIAGNOSIS — M25512 Pain in left shoulder: Secondary | ICD-10-CM | POA: Diagnosis not present

## 2020-02-11 ENCOUNTER — Ambulatory Visit: Payer: PPO | Admitting: Family Medicine

## 2020-02-12 ENCOUNTER — Ambulatory Visit: Payer: PPO | Admitting: Family Medicine

## 2020-02-18 ENCOUNTER — Other Ambulatory Visit: Payer: Self-pay

## 2020-02-18 ENCOUNTER — Ambulatory Visit (INDEPENDENT_AMBULATORY_CARE_PROVIDER_SITE_OTHER): Payer: PPO | Admitting: Nurse Practitioner

## 2020-02-18 ENCOUNTER — Other Ambulatory Visit (INDEPENDENT_AMBULATORY_CARE_PROVIDER_SITE_OTHER): Payer: PPO

## 2020-02-18 ENCOUNTER — Encounter: Payer: Self-pay | Admitting: Nurse Practitioner

## 2020-02-18 VITALS — BP 108/68 | HR 62 | Temp 98.2°F | Ht 69.0 in | Wt 207.0 lb

## 2020-02-18 DIAGNOSIS — R112 Nausea with vomiting, unspecified: Secondary | ICD-10-CM

## 2020-02-18 DIAGNOSIS — K589 Irritable bowel syndrome without diarrhea: Secondary | ICD-10-CM | POA: Diagnosis not present

## 2020-02-18 DIAGNOSIS — R1011 Right upper quadrant pain: Secondary | ICD-10-CM | POA: Diagnosis not present

## 2020-02-18 LAB — CBC WITH DIFFERENTIAL/PLATELET
Basophils Absolute: 0.1 10*3/uL (ref 0.0–0.1)
Basophils Relative: 0.7 % (ref 0.0–3.0)
Eosinophils Absolute: 0.1 10*3/uL (ref 0.0–0.7)
Eosinophils Relative: 1.5 % (ref 0.0–5.0)
HCT: 40.5 % (ref 36.0–46.0)
Hemoglobin: 13.6 g/dL (ref 12.0–15.0)
Lymphocytes Relative: 24.9 % (ref 12.0–46.0)
Lymphs Abs: 2.2 10*3/uL (ref 0.7–4.0)
MCHC: 33.6 g/dL (ref 30.0–36.0)
MCV: 82.5 fl (ref 78.0–100.0)
Monocytes Absolute: 0.6 10*3/uL (ref 0.1–1.0)
Monocytes Relative: 6.3 % (ref 3.0–12.0)
Neutro Abs: 6 10*3/uL (ref 1.4–7.7)
Neutrophils Relative %: 66.6 % (ref 43.0–77.0)
Platelets: 164 10*3/uL (ref 150.0–400.0)
RBC: 4.91 Mil/uL (ref 3.87–5.11)
RDW: 13.6 % (ref 11.5–15.5)
WBC: 9 10*3/uL (ref 4.0–10.5)

## 2020-02-18 LAB — COMPREHENSIVE METABOLIC PANEL
ALT: 14 U/L (ref 0–35)
AST: 12 U/L (ref 0–37)
Albumin: 4.1 g/dL (ref 3.5–5.2)
Alkaline Phosphatase: 72 U/L (ref 39–117)
BUN: 12 mg/dL (ref 6–23)
CO2: 25 mEq/L (ref 19–32)
Calcium: 9.6 mg/dL (ref 8.4–10.5)
Chloride: 98 mEq/L (ref 96–112)
Creatinine, Ser: 0.64 mg/dL (ref 0.40–1.20)
GFR: 102.41 mL/min (ref >60.00)
Glucose, Bld: 157 mg/dL — ABNORMAL HIGH (ref 70–99)
Potassium: 4.1 mEq/L (ref 3.5–5.1)
Sodium: 134 mEq/L — ABNORMAL LOW (ref 135–145)
Total Bilirubin: 0.3 mg/dL (ref 0.2–1.2)
Total Protein: 7.7 g/dL (ref 6.0–8.3)

## 2020-02-18 LAB — LIPASE: Lipase: 17 U/L (ref 11.0–59.0)

## 2020-02-18 LAB — C-REACTIVE PROTEIN: CRP: 1.4 mg/dL (ref 0.5–20.0)

## 2020-02-18 MED ORDER — ONDANSETRON HCL 4 MG PO TABS: 4.0000 mg | ORAL_TABLET | Freq: Three times a day (TID) | ORAL | 0 refills | Status: DC | PRN

## 2020-02-18 MED ORDER — HYOSCYAMINE SULFATE 0.125 MG SL SUBL: SUBLINGUAL_TABLET | SUBLINGUAL | 1 refills | Status: DC

## 2020-02-18 NOTE — Patient Instructions (Addendum)
If you are age 41 or older, your body mass index should be between 23-30. Your Body mass index is 30.57 kg/m. If this is out of the aforementioned range listed, please consider follow up with your Primary Care Provider.  If you are age 53 or younger, your body mass index should be between 19-25. Your Body mass index is 30.57 kg/m. If this is out of the aformentioned range listed, please consider follow up with your Primary Care Provider.   Avoid fatty foods  Your provider has requested that you go to the basement level for lab work before leaving today. Press "B" on the elevator. The lab is located at the first door on the left as you exit the elevator.  We have sent the following medications to your pharmacy for you to pick up at your convenience: Ondansetron 4mg  hyoscamine 0.125   Due to recent changes in healthcare laws, you may see the results of your imaging and laboratory studies on MyChart before your provider has had a chance to review them.  We understand that in some cases there may be results that are confusing or concerning to you. Not all laboratory results come back in the same time frame and the provider may be waiting for multiple results in order to interpret others.  Please give [place] 48 hours in order for your provider to thoroughly review all the results before contacting the office for clarification of your results.   You have been scheduled for a HIDA scan at [facility] (1st floor) on 02/26/2020. Please arrive 15 minutes prior to your scheduled appointment at  123456 am. Make certain not to have anything to eat or drink at least 6 hours prior to your test. Should this appointment date or time not work well for you, please call radiology scheduling at [phone].  _____________________________________________________________________ hepatobiliary (HIDA) scan is an imaging procedure used to diagnose problems in the liver, gallbladder and bile ducts. In the HIDA scan, a  radioactive chemical or tracer is injected into a vein in your arm. The tracer is handled by the liver like bile. Bile is a fluid produced and excreted by your liver that helps your digestive system break down fats in the foods you eat. Bile is stored in your gallbladder and the gallbladder releases the bile when you eat a meal. A special nuclear medicine scanner (gamma camera) tracks the flow of the tracer from your liver into your gallbladder and small intestine.  During your HIDA scan  You'll be asked to change into a hospital gown before your HIDA scan begins. Your health care team will position you on a table, usually on your back. The radioactive tracer is then injected into a vein in your arm.The tracer travels through your bloodstream to your liver, where it's taken up by the bile-producing cells. The radioactive tracer travels with the bile from your liver into your gallbladder and through your bile ducts to your small intestine.You may feel some pressure while the radioactive tracer is injected into your vein. As you lie on the table, a special gamma camera is positioned over your abdomen taking pictures of the tracer as it moves through your body. The gamma camera takes pictures continually for about an hour. You'll need to keep still during the HIDA scan. This can become uncomfortable, but you may find that you can lessen the discomfort by taking deep breaths and thinking about other things. Tell your health care team if you're uncomfortable. The radiologist will watch  on a computer the progress of the radioactive tracer through your body. The HIDA scan may be stopped when the radioactive tracer is seen in the gallbladder and enters your small intestine. This typically takes about an hour. In some cases extra imaging will be performed if original images aren't satisfactory, if morphine is given to help visualize the gallbladder or if the medication CCK is given to look at the contraction of the  gallbladder. This test typically takes 2 hours to complete. ________________________________________________________________________

## 2020-02-18 NOTE — Progress Notes (Signed)
02/18/2020 Samantha [name] [id number] September 25, 1981   Chief Complaint: Episodes of RUQ abdominal pain   History of Present Illness: Samantha [name] is a 39 year old female with a past medical history of anxiety, depression, fibromyalgia, DM II, seizures, sleep apnea, GERD and hepatic steatosis. She presents today for follow up regarding chronic abdominal pain and diarrhea. She was last seen in office by Dr. Hilarie [name] on 07/31/2018 with upper abdominal pain, abdominal bloat and loose stools. Metformin did not correlate to onset of symptoms. She was prescribed Xifaxan '550mg'$  po tid x 14 days without improvement. She used Dicyclomine without improvement.   She complains of having 3 to 4 episodes of nausea with associated RUQ pain from Sept. 2020 through March 2021. She went to the [facility] ED 08/09/2019 with RUQ abdominal pain with vomiting. An abdominal sonogram showed a normal gallbladder and hepatic steatosis.   Two out of the four episodes awakened her from sleep. The most recent episode occurred on 3/1 or 3/5. She developed nausea with mild RUQ pain around 5pm. Her RUQ pain progressively worsened over the past hour, rated pain a 9 on scale of 1 to 10. She described the pain as a sharp steady burning pain which lasted for approximately 4 hours. Her abdomen felt bloated during this time. Earlier in the morning, she passed 6 soft to mud like stools without blood or mucous. No watery diarrhea. No rectal bleeding or melena. Fatty foods worsen her symptoms. Last week, she ate a cheese burger which resulted in generalized abdominal pain and frequent loose BMs. She denies using artifical sweeteners. Limited dairy products. The only time she passes a firm solid stool was when she took narcotics after her knee surgery  10/2019 She has intentionally lost 20lbs over the past year after she stopped drinking sodas. EGD and colonoscopy by Dr. Benson [name] 10/12/2015 were normal. Food allergy testing by Dr. Rexene [name] 02/2019 without  significant findings.     CBC Latest Ref Rng & Units 10/10/2019 08/09/2019 05/12/2019  WBC 3.4 - 10.8 x10E3/uL 6.7 8.3 10.0  Hemoglobin 11.1 - 15.9 g/dL 13.3 13.4 13.5  Hematocrit 34.0 - 46.6 % 40.2 42.5 42.2  Platelets 150 - 450 x10E3/uL 195 191 222   CMP Latest Ref Rng & Units 10/10/2019 08/09/2019 05/12/2019  Glucose 65 - 99 mg/dL 113(H) 104(H) 158(H)  BUN 6 - 24 mg/dL '11 13 14  '$ Creatinine 0.57 - 1.00 mg/dL 0.62 0.71 0.89  Sodium 134 - 144 mmol/L 137 138 138  Potassium 3.5 - 5.2 mmol/L 4.5 4.0 3.7  Chloride 96 - 106 mmol/L 100 101 99  CO2 20 - 29 mmol/L '25 28 28  '$ Calcium 8.7 - 10.2 mg/dL 9.1 9.2 9.1  Total Protein 6.0 - 8.5 g/dL 7.0 7.9 8.1  Total Bilirubin 0.0 - 1.2 mg/dL 0.2 0.2(L) 0.4  Alkaline Phos 39 - 117 IU/L 87 75 79  AST 0 - 40 IU/L 18 13(L) 28  ALT 0 - 32 IU/L '19 16 27   '$ Abdominal sono 08/09/2019: 1. No acute sonographic abnormality detected. No sonographic evidence for cholelithiasis or acute cholecystitis. 2. Hepatic steatosis.  Abdominal/pelvic CT without contrast 05/12/2019  There is a 5 mm calculus at the left ureterovesicular junction with mild associated left hydronephrosis and hydroureter. There are multiple small additional nonobstructive calculi present bilaterally   Past Medical History:  Diagnosis Date  . Anemia   . Angio-edema   . Anxiety   . Complication of anesthesia    slow to  wake up  . Depression    rx presc but not taken  . Diabetes mellitus without complication ([place])   . Fibromyalgia   . GERD (gastroesophageal reflux disease)    no meds  . Headache(784.0)    tension and with anxiety hx  . Hepatic steatosis   . History of kidney stones   . MVP (mitral valve prolapse)    echo 10  . Pneumonia    hx  . Seizures ([place])    non epileptic events per epilepsy monitoring unit  . Seizures ([place]    last seizure July 2015- does not convulse but has a sleep effect  . Sinus infection    recent on antibiotic  . Sleep apnea    has cpap does not use    Current Outpatient Medications on File Prior to Visit  Medication Sig Dispense Refill  . amitriptyline (ELAVIL) 10 MG tablet Take 30 mg by mouth.    . blood glucose meter kit and supplies Dispense based on patient and insurance preference. Use to check blood glucose levels fasting in the morning and 2 hours after largest meal daily.  (FOR ICD-10 E10.9, E11.9). 1 each 0  . FLUoxetine (PROZAC) 20 MG tablet TAKE 1 TABLET BY MOUTH DAILY 90 tablet 0  . liraglutide (VICTOZA) 18 MG/3ML SOPN 1.'2mg'$  [place] qd (Patient taking differently: Inject 1.2 mg into the skin daily. ) 6 pen 1  . mupirocin ointment (BACTROBAN) 2 % Apply to affected area TID for 10 days. (Patient taking differently: Apply to affected area prn) 30 g 0  . Turmeric 500 MG TABS Take 500 mg by mouth daily.    . valACYclovir (VALTREX) 500 MG tablet Take 500 mg by mouth daily as needed (cold sores).     . Vitamin D, Ergocalciferol, (DRISDOL) 1.25 MG (50000 UT) CAPS capsule Take one tablet wkly 12 capsule 3   No current facility-administered medications on file prior to visit.   Allergies  Allergen Reactions  . Sulfa Antibiotics Anaphylaxis and Swelling  . Topiramate Swelling    Tongue  . Duloxetine Hcl Other (See Comments)    Weight gain, feels "crazy"  . Ibuprofen     Due to taking Elavil, causes counter headaches when she takes Ibuprofen  . Metformin And Related Other (See Comments)    GI Upset  . Penicillins Rash    Has patient had a PCN reaction causing immediate rash, facial/tongue/throat swelling, SOB or lightheadedness with hypotension: Yes Has patient had a PCN reaction causing severe rash involving mucus membranes or skin necrosis: No Has patient had a PCN reaction that required hospitalization No Has patient had a PCN reaction occurring within the last 10 years: No If all of the above answers are "NO", then may proceed with Cephalosporin use.       Current Medications, Allergies, Past Medical History, Past Surgical  History, Family History and Social History were reviewed in [employer] record.   Physical Exam: LMP 08/05/2014 (Approximate)   BP 108/68   Pulse 62   Temp 98.2 F (36.8 C)   Ht '5\' 9"'$  (1.753 m)   Wt 207 lb (93.9 kg)   LMP 08/05/2014 (Approximate)   BMI 30.57 kg/m   Weight 03/04/2019 224.  General: Well developed female in no acute distress. Head: Normocephalic and atraumatic. Eyes:  No scleral icterus. Conjunctiva pink . Ears: Normal auditory acuity. Lungs: Clear throughout to auscultation. Heart: Regular rate and rhythm, no murmur. Abdomen: Soft, nontender and nondistended. No masses or  hepatomegaly. Normal bowel sounds x 4 quadrants.  Rectal: Deferred.  Musculoskeletal: Symmetrical with no gross deformities. Extremities: No edema. Neurological: Alert oriented x 4. No focal deficits.  Psychological:  Alert and cooperative. Normal mood and affect  Assessment and Recommendations:  86. 39 year old female with episodic RUQ pain and nausea.  -CBC, CMP, CRP and Lipase -CCK HIDA to rule out biliary dyskinesia  -Ondansetron '4mg'$  ODT  Q 6 to 8 hrs PRN -Hyoscyamine 0.'125mg'$  1 tab under tongue Q 6 to 8 hrs PRN abdominal pain -If CCK HIDA negative, I would advised an abd/pelvic CT with oral and IV contrast ( to further assess the liver, pancreas and bowel as her CTAP 05/2019 was without contrast. -Avoid fatty foods -Eventual gastric empty study    2. IBS -Hyoscyamine as needed -Consider trial of cholestyramine, await CCK HIDA results   3. Hepatic steatosis. She has intentionally lost 20 lbs over the past year. Normal LFTs.   4. DM II

## 2020-02-19 NOTE — Progress Notes (Signed)
Addendum: Reviewed and agree with assessment and management plan. Jessicalynn Deshong M, MD  

## 2020-02-26 ENCOUNTER — Ambulatory Visit (HOSPITAL_COMMUNITY)
Admission: RE | Admit: 2020-02-26 | Discharge: 2020-02-26 | Disposition: A | Discharge status: Home / Self Care | Payer: PPO | Source: Ambulatory Visit | Attending: Nurse Practitioner | Admitting: Nurse Practitioner

## 2020-02-26 ENCOUNTER — Other Ambulatory Visit: Payer: Self-pay

## 2020-02-26 DIAGNOSIS — K589 Irritable bowel syndrome without diarrhea: Secondary | ICD-10-CM | POA: Insufficient documentation

## 2020-02-26 DIAGNOSIS — R1011 Right upper quadrant pain: Secondary | ICD-10-CM

## 2020-02-26 DIAGNOSIS — R112 Nausea with vomiting, unspecified: Secondary | ICD-10-CM

## 2020-02-26 MED ORDER — TECHNETIUM TC 99M MEBROFENIN IV KIT
5.0500 | PACK | Freq: Once | INTRAVENOUS | Status: AC | PRN
  Administered 2020-02-26: 5.05 via INTRAVENOUS

## 2020-03-05 ENCOUNTER — Other Ambulatory Visit: Payer: Self-pay

## 2020-03-05 DIAGNOSIS — R1011 Right upper quadrant pain: Secondary | ICD-10-CM

## 2020-03-05 DIAGNOSIS — R112 Nausea with vomiting, unspecified: Secondary | ICD-10-CM

## 2020-03-17 ENCOUNTER — Ambulatory Visit (HOSPITAL_COMMUNITY): Payer: PPO

## 2020-03-18 ENCOUNTER — Telehealth: Payer: Self-pay | Admitting: Nurse Practitioner

## 2020-03-18 NOTE — Telephone Encounter (Signed)
I called the patient she reported having 3 black coffee ground appearing stools today feels progressively more weak. No Pepto or iron use. No history of GI bleed. I advised the pt to go to [facility] ER near her home for labs and further evaluation, rule out GI bleeding. Her husband will take her to the ER. She will call me tomorrow with an update. She will need follow up in our office.   Bre, please call the patient tomorrow 4/15 with an update regarding her ED evaluation.

## 2020-03-18 NOTE — Telephone Encounter (Signed)
Called and spoke with patient- patient reports she noticed her stools were coffee ground like today; denies blood in toilet or on toilet paper; patient also reports feeling more weak; patient reports stomach pain "at the top of my stomach now in addition to the gallbladder area pain"; reports some nausea after BM; is still able to eat/drink but not able to "finish my meal before I have to go to the restroom"; reports she is not taking any medication- denies taking Pepto-Bismol or iron supplements;   Please advise

## 2020-03-19 ENCOUNTER — Other Ambulatory Visit (INDEPENDENT_AMBULATORY_CARE_PROVIDER_SITE_OTHER): Payer: PPO

## 2020-03-19 ENCOUNTER — Telehealth: Payer: Self-pay | Admitting: Nurse Practitioner

## 2020-03-19 DIAGNOSIS — R112 Nausea with vomiting, unspecified: Secondary | ICD-10-CM | POA: Diagnosis not present

## 2020-03-19 DIAGNOSIS — R1011 Right upper quadrant pain: Secondary | ICD-10-CM

## 2020-03-19 LAB — CBC WITH DIFFERENTIAL/PLATELET
Basophils Absolute: 0 10*3/uL (ref 0.0–0.1)
Basophils Relative: 0.7 % (ref 0.0–3.0)
Eosinophils Absolute: 0.2 10*3/uL (ref 0.0–0.7)
Eosinophils Relative: 2.7 % (ref 0.0–5.0)
HCT: 39.2 % (ref 36.0–46.0)
Hemoglobin: 13 g/dL (ref 12.0–15.0)
Lymphocytes Relative: 33 % (ref 12.0–46.0)
Lymphs Abs: 2.3 10*3/uL (ref 0.7–4.0)
MCHC: 33.2 g/dL (ref 30.0–36.0)
MCV: 83.6 fl (ref 78.0–100.0)
Monocytes Absolute: 0.4 10*3/uL (ref 0.1–1.0)
Monocytes Relative: 5.9 % (ref 3.0–12.0)
Neutro Abs: 4.1 10*3/uL (ref 1.4–7.7)
Neutrophils Relative %: 57.7 % (ref 43.0–77.0)
Platelets: 171 10*3/uL (ref 150.0–400.0)
RBC: 4.69 Mil/uL (ref 3.87–5.11)
RDW: 13.8 % (ref 11.5–15.5)
WBC: 7 10*3/uL (ref 4.0–10.5)

## 2020-03-19 NOTE — Telephone Encounter (Signed)
Bre, thank you for update. If pt's sx worsen then to ER. Pls have her go to the lab today for a CBC. thx

## 2020-03-19 NOTE — Telephone Encounter (Signed)
Called and spoke with patient-patient informed of provider's  recommendations; patient is agreeable with plan of care and reports she will try to complete lab work today/tomorrow; Patient verbalized understanding of information/instructions;  Patient was advised to call the office at [phone] if questions/concerns arise;

## 2020-03-19 NOTE — Telephone Encounter (Signed)
Called and spoke with patient- patient reports she is feeling "a little better than I was yesterday but I still don't feel good"; patient requested to be scheduled to be seen in the office- patient has been scheduled to be seen by Nevin Bloodgood on 03/23/2020 at 2:00 pm at the [facility] office; Patient advised to call back to the office at [phone] should questions/concerns arise;  Patient verbalized understanding of information/instructions;

## 2020-03-19 NOTE — Telephone Encounter (Signed)
Pt's husband Will called stating that his wife did not go to the ED as it was recommended. She woke up feeling a little better this morning but has not had a bm yet. He would like his wife to be seen asap. Pls call pt or him.

## 2020-03-19 NOTE — Telephone Encounter (Signed)
Please see additional documentation for information

## 2020-03-23 ENCOUNTER — Ambulatory Visit: Payer: PPO | Admitting: Nurse Practitioner

## 2020-03-25 ENCOUNTER — Ambulatory Visit (HOSPITAL_COMMUNITY)
Admission: RE | Admit: 2020-03-25 | Discharge: 2020-03-25 | Disposition: A | Discharge status: Home / Self Care | Payer: PPO | Source: Ambulatory Visit | Attending: Nurse Practitioner | Admitting: Nurse Practitioner

## 2020-03-25 ENCOUNTER — Other Ambulatory Visit: Payer: Self-pay

## 2020-03-25 DIAGNOSIS — R1011 Right upper quadrant pain: Secondary | ICD-10-CM | POA: Diagnosis not present

## 2020-03-25 DIAGNOSIS — R112 Nausea with vomiting, unspecified: Secondary | ICD-10-CM | POA: Diagnosis not present

## 2020-03-25 DIAGNOSIS — R197 Diarrhea, unspecified: Secondary | ICD-10-CM | POA: Diagnosis not present

## 2020-03-25 DIAGNOSIS — R109 Unspecified abdominal pain: Secondary | ICD-10-CM | POA: Diagnosis not present

## 2020-03-25 MED ORDER — SODIUM CHLORIDE (PF) 0.9 % IJ SOLN
INTRAMUSCULAR | Status: AC
  Filled 2020-03-25: qty 50

## 2020-03-25 MED ORDER — IOHEXOL 300 MG/ML  SOLN
100.0000 mL | Freq: Once | INTRAMUSCULAR | Status: AC | PRN
  Administered 2020-03-25: 100 mL via INTRAVENOUS

## 2020-04-06 ENCOUNTER — Ambulatory Visit: Payer: PPO | Admitting: Cardiology

## 2020-04-08 ENCOUNTER — Ambulatory Visit (INDEPENDENT_AMBULATORY_CARE_PROVIDER_SITE_OTHER): Payer: PPO | Admitting: Internal Medicine

## 2020-04-08 ENCOUNTER — Encounter: Payer: Self-pay | Admitting: Internal Medicine

## 2020-04-08 ENCOUNTER — Other Ambulatory Visit: Payer: Self-pay

## 2020-04-08 VITALS — BP 110/80 | HR 106 | Ht 69.0 in | Wt 213.1 lb

## 2020-04-08 DIAGNOSIS — I10 Essential (primary) hypertension: Secondary | ICD-10-CM | POA: Diagnosis not present

## 2020-04-08 DIAGNOSIS — R079 Chest pain, unspecified: Secondary | ICD-10-CM | POA: Diagnosis not present

## 2020-04-08 DIAGNOSIS — R002 Palpitations: Secondary | ICD-10-CM | POA: Diagnosis not present

## 2020-04-08 DIAGNOSIS — I517 Cardiomegaly: Secondary | ICD-10-CM

## 2020-04-08 DIAGNOSIS — R0602 Shortness of breath: Secondary | ICD-10-CM | POA: Insufficient documentation

## 2020-04-08 MED ORDER — METOPROLOL SUCCINATE ER 25 MG PO TB24: 12.5000 mg | ORAL_TABLET | Freq: Every day | ORAL | 1 refills | Status: DC

## 2020-04-08 MED ORDER — ASPIRIN EC 81 MG PO TBEC: 81.0000 mg | DELAYED_RELEASE_TABLET | Freq: Every day | ORAL | 3 refills | Status: DC

## 2020-04-08 NOTE — Patient Instructions (Signed)
Medication Instructions:  Your physician has recommended you make the following change in your medication:  1- START Metoprolol succinate 12.5 mg (0.5 tablet) by  Mouth once a day. 2- START Aspirin 81 mg by mouth once a day.  *If you need a refill on your cardiac medications before your next appointment, please call your pharmacy*  Lab Work: none If you have labs (blood work) drawn today and your tests are completely normal, you will receive your results only by: Marland Kitchen MyChart Message (if you have MyChart) OR . A paper copy in the mail If you have any lab test that is abnormal or we need to change your treatment, we will call you to review the results.   Testing/Procedures: Your physician has requested that you have an echocardiogram. Echocardiography is a painless test that uses sound waves to create images of your heart. It provides your doctor with information about the size and shape of your heart and how well your heart's chambers and valves are working. This procedure takes approximately one hour. There are no restrictions for this procedure. You may get an IV, if needed, to receive an ultrasound enhancing agent through to better visualize your heart.    Follow-Up: At [facility], you and your health needs are our priority.  As part of our continuing mission to provide you with exceptional heart care, we have created designated Provider Care Teams.  These Care Teams include your primary Cardiologist (physician) and Advanced Practice Providers (APPs -  Physician Assistants and Nurse Practitioners) who all work together to provide you with the care you need, when you need it.  We recommend signing up for the patient portal called "MyChart".  Sign up information is provided on this After Visit Summary.  MyChart is used to connect with patients for Virtual Visits (Telemedicine).  Patients are able to view lab/test results, encounter notes, upcoming appointments, etc.  Non-urgent messages can  be sent to your provider as well.   To learn more about what you can do with MyChart, go to [email or web address].    Your next appointment:   2 month(s)  The format for your next appointment:   In Person  Provider:    You may see DR Harrell Gave END or one of the following Advanced Practice Providers on your designated Care Team:    Murray Hodgkins, NP  Christell Faith, PA-C  Marrianne Mood, PA-C   Echocardiogram An echocardiogram is a procedure that uses painless sound waves (ultrasound) to produce an image of the heart. Images from an echocardiogram can provide important information about:  Signs of coronary artery disease (CAD).  Aneurysm detection. An aneurysm is a weak or damaged part of an artery wall that bulges out from the normal force of blood pumping through the body.  Heart size and shape. Changes in the size or shape of the heart can be associated with certain conditions, including heart failure, aneurysm, and CAD.  Heart muscle function.  Heart valve function.  Signs of a past heart attack.  Fluid buildup around the heart.  Thickening of the heart muscle.  A tumor or infectious growth around the heart valves. Tell a health care provider about:  Any allergies you have.  All medicines you are taking, including vitamins, herbs, eye drops, creams, and over-the-counter medicines.  Any blood disorders you have.  Any surgeries you have had.  Any medical conditions you have.  Whether you are pregnant or may be pregnant. What are the risks? Generally, this is a  safe procedure. However, problems may occur, including:  Allergic reaction to dye (contrast) that may be used during the procedure. What happens before the procedure? No specific preparation is needed. You may eat and drink normally. What happens during the procedure?   An IV tube may be inserted into one of your veins.  You may receive contrast through this tube. A contrast is an injection  that improves the quality of the pictures from your heart.  A gel will be applied to your chest.  A wand-like tool (transducer) will be moved over your chest. The gel will help to transmit the sound waves from the transducer.  The sound waves will harmlessly bounce off of your heart to allow the heart images to be captured in real-time motion. The images will be recorded on a computer. The procedure may vary among health care providers and hospitals. What happens after the procedure?  You may return to your normal, everyday life, including diet, activities, and medicines, unless your health care provider tells you not to do that. Summary  An echocardiogram is a procedure that uses painless sound waves (ultrasound) to produce an image of the heart.  Images from an echocardiogram can provide important information about the size and shape of your heart, heart muscle function, heart valve function, and fluid buildup around your heart.  You do not need to do anything to prepare before this procedure. You may eat and drink normally.  After the echocardiogram is completed, you may return to your normal, everyday life, unless your health care provider tells you not to do that. This information is not intended to replace advice given to you by your health care provider. Make sure you discuss any questions you have with your health care provider. Document Revised: 03/14/2019 Document Reviewed: 12/24/2016 Elsevier Patient Education  [employer].

## 2020-04-08 NOTE — Progress Notes (Signed)
New Outpatient Visit Date: 04/08/2020  Primary Care Provider: [name], [address] [place],  [place] [address]  Chief Complaint: Enlarged heart  HPI:  Samantha [name] is a 39 y.o. female who is being seen today as a self-referral for evaluation of "an enlarged heart."  She has a history of hypertension, hyperlipidemia, type 2 diabetes mellitus, obstructive sleep apnea, GERD, hepatic steatosis, and fibromyalgia.  Samantha [name] recently underwent CT of the abdomen and pelvis for evaluation of abdominal pain.  CT report made note of mild enlargement of the visualized heart, which was noted by the patient on review of the report.  Samantha [name] states that she was diagnosed with mitral valve prolapse as a child and had an echocardiogram many years ago.  She does not recall specifics about this study.  Samantha [name] reports a long history of chest pain that has been present most days for years.  It has led to 5 ED visits at [facility].  She was diagnosed with chest wall pain.  Pain is present most days and waxes and wanes in intensity.  It seems to be worsened by exertion.  At times, she feels as though air is a "golf ball" sized lump overlying the superior sternum, particularly when she has been using her arms a lot.  She also reports chronic exertional dyspnea.  She denies having undergone ischemia testing in the past.  Samantha [name] notes frequent palpitations, which have been attributed to anxiety in the past.  However, she is concerned because they are still present despite being on medications for her anxiety.  She also notes occasional lightheadedness.  She has not had any edema.  She has been diagnosed with obstructive sleep apnea but is not using CPAP at this time, as she believes that her machine needs to be adjusted after she lost about 50 pounds.  --------------------------------------------------------------------------------------------------  Cardiovascular History & Procedures: Cardiovascular  Problems:  Enlarged heart  Chest pain  Mitral valve prolapse  Risk Factors:  Hypertension, hyperlipidemia, diabetes mellitus, and obesity  Cath/PCI:  None  CV Surgery:  None  EP Procedures and Devices:  None   Non-Invasive Evaluation(s):  None available  Recent CV Pertinent Labs: Lab Results  Component Value Date   CHOL 133 10/10/2019   HDL 44 10/10/2019   LDLCALC 62 10/10/2019   TRIG 161 (H) 10/10/2019   CHOLHDL 3.0 10/10/2019   CHOLHDL 2.6 07/12/2009   INR 1.0 07/11/2009   K 4.1 02/18/2020   MG 1.1 (L) 03/25/2009   BUN 12 02/18/2020   BUN 11 10/10/2019   CREATININE 0.64 02/18/2020    --------------------------------------------------------------------------------------------------  Past Medical History:  Diagnosis Date  . Anemia   . Angio-edema   . Anxiety   . Complication of anesthesia    slow to wake up  . Depression    rx presc but not taken  . Diabetes mellitus without complication ([place])   . Fibromyalgia   . GERD (gastroesophageal reflux disease)    no meds  . Headache(784.0)    tension and with anxiety hx  . Hepatic steatosis   . History of kidney stones   . MVP (mitral valve prolapse)    echo 10  . Pneumonia    hx  . Seizures ([place])    non epileptic events per epilepsy monitoring unit  . Seizures ([place])    last seizure July 2015- does not convulse but has a sleep effect  . Sinus infection    recent on antibiotic  .  Sleep apnea    has cpap does not use    Past Surgical History:  Procedure Laterality Date  . ABDOMINAL HYSTERECTOMY N/A 08/15/2014   Procedure: HYSTERECTOMY ABDOMINAL WITH CYSTOSCOPY;  Surgeon: Sanjuana Kava, MD;  Location: [facility];  Service: Gynecology;  Laterality: N/A;  . CESAREAN SECTION N/A   . CYSTOSCOPY/RETROGRADE/URETEROSCOPY Left 05/23/2019   Procedure: CYSTOSCOPY/RETROGRADE/URETEROSCOPY;  Surgeon: Abbie Sons, MD;  Location: [facility];  Service: Urology;  Laterality: Left;  . KNEE ARTHROSCOPY    . SHOULDER  ARTHROSCOPY WITH SUBACROMIAL DECOMPRESSION AND OPEN ROTATOR C Left 11/27/2015   Procedure: LEFT SHOULDER ARTHROSCOPY WITH SUBACROMIAL DECOMPRESSION, MINI OPEN ROTATOR CUFF REPAIR;  Surgeon: Netta Cedars, MD;  Location: [facility];  Service: Orthopedics;  Laterality: Left;  . TUBAL LIGATION      Current Meds  Medication Sig  . amitriptyline (ELAVIL) 10 MG tablet Take 30 mg by mouth at bedtime.   . blood glucose meter kit and supplies Dispense based on patient and insurance preference. Use to check blood glucose levels fasting in the morning and 2 hours after largest meal daily.  (FOR ICD-10 E10.9, E11.9).  Marland Kitchen FLUoxetine (PROZAC) 20 MG tablet TAKE 1 TABLET BY MOUTH DAILY  . hyoscyamine (LEVSIN SL) 0.125 MG SL tablet 1 tablet sublingual every 6-8 hours as needed for abdominal pain.  Marland Kitchen liraglutide (VICTOZA) 18 MG/3ML SOPN 1.'2mg'$  Allison qd (Patient taking differently: Inject 1.2 mg into the skin daily. )  . mupirocin ointment (BACTROBAN) 2 % Apply to affected area TID for 10 days. (Patient taking differently: Apply to affected area prn)  . ondansetron (ZOFRAN) 4 MG tablet Take 1 tablet (4 mg total) by mouth every 8 (eight) hours as needed for nausea or vomiting.  . Turmeric 500 MG TABS Take 500 mg by mouth daily.  . valACYclovir (VALTREX) 500 MG tablet Take 500 mg by mouth daily as needed (cold sores).   . Vitamin D, Ergocalciferol, (DRISDOL) 1.25 MG (50000 UT) CAPS capsule Take one tablet wkly    Allergies: Sulfa antibiotics, Topiramate, Duloxetine hcl, Ibuprofen, Metformin and related, and Penicillins  Social History   Tobacco Use  . Smoking status: Never Smoker  . Smokeless tobacco: Never Used  Substance Use Topics  . Alcohol use: No  . Drug use: No    Family History  Problem Relation Age of Onset  . Diabetes Mother   . Hypertension Mother   . Healthy Father   . Breast cancer Maternal Grandmother   . Cancer Maternal Grandfather        brain, lung, liver  . Heart attack Paternal Grandfather      Review of Systems: A 12-system review of systems was performed and was negative except as noted in the HPI.  --------------------------------------------------------------------------------------------------  Physical Exam: BP 110/80 (BP Location: Right Arm, Patient Position: Sitting, Cuff Size: Normal)   Pulse (!) 106   Ht '5\' 9"'$  (1.753 m)   Wt 213 lb 2 oz (96.7 kg)   LMP 08/15/2014 Comment: partial hysterectomy  SpO2 95%   BMI 31.47 kg/m   General: NAD. HEENT: No conjunctival pallor or scleral icterus. Facemask in place. Neck: Supple without lymphadenopathy, thyromegaly, JVD, or HJR, though body habitus limits evaluation. No carotid bruit. Lungs: Normal work of breathing. Clear to auscultation bilaterally without wheezes or crackles. Heart: Regular rate and rhythm without murmurs, rubs, or gallops.  Unable to assess PMI due to body habitus. Abd: Bowel sounds present. Soft, NT/ND.  Unable to assess HSM due to body habitus. Ext: No  lower extremity edema. Radial, PT, and DP pulses are 2+ bilaterally Skin: Warm and dry without rash. Neuro: CNIII-XII intact. Strength and fine-touch sensation intact in upper and lower extremities bilaterally. Psych: Normal mood and affect.  EKG: Sinus tachycardia (heart rate 106 bpm) with borderline LVH, QT prolongation, and poor R wave progression.  Compared with prior tracing from 08/09/2019, poor R wave progression is more pronounced.  Lab Results  Component Value Date   WBC 7.0 03/19/2020   HGB 13.0 03/19/2020   HCT 39.2 03/19/2020   MCV 83.6 03/19/2020   PLT 171.0 03/19/2020    Lab Results  Component Value Date   NA 134 (L) 02/18/2020   K 4.1 02/18/2020   CL 98 02/18/2020   CO2 25 02/18/2020   BUN 12 02/18/2020   CREATININE 0.64 02/18/2020   GLUCOSE 157 (H) 02/18/2020   ALT 14 02/18/2020    Lab Results  Component Value Date   CHOL 133 10/10/2019   HDL 44 10/10/2019   LDLCALC 62 10/10/2019   TRIG 161 (H) 10/10/2019   CHOLHDL  3.0 10/10/2019   Lab Results  Component Value Date   TSH 0.730 10/10/2019   T3TOTAL 138 10/08/2019    --------------------------------------------------------------------------------------------------  ASSESSMENT AND PLAN: Enlarged heart, shortness of breath, and chest pain: Enlargement of the visualized heart was noted by radiologist on CT of the abdomen and pelvis last month.  The patient reports a long history of atypical chest pain and exertional dyspnea.  Examination today is unrevealing.  EKG shows LVH and poor R wave progression, which may reflect lead placement.  Though Ms. Nilsen is fairly young, she has multiple cardiac risk factors including hypertension, hyperlipidemia, diabetes mellitus, and obesity.  I have recommended that we obtain a transthoracic echocardiogram.  Based on findings of this, we will can need to consider noninvasive ischemia testing versus catheterization.  In the meantime, I have recommended that Ms. Polio begin taking metoprolol succinate 12.5 mg daily as well as aspirin 81 mg daily.  Hypertension: Blood pressure normal today.  We will try low-dose metoprolol for management of her palpitations and sinus tachycardia noted today.  Palpitations: Nonspecific with EKG today demonstrating mild sinus tachycardia.  I recommended obtaining an echocardiogram to exclude structural abnormalities.  We will begin metoprolol succinate 12.5 mg daily.  Recent labs including CBC and CMP were normal.  TSH was also normal when previously checked in 2019 and 10/2019.  Follow-up: Return to clinic in 2 months.  Nelva Bush, MD 04/08/2020 3:38 PM

## 2020-04-11 ENCOUNTER — Other Ambulatory Visit: Payer: Self-pay | Admitting: Family Medicine

## 2020-04-11 DIAGNOSIS — R454 Irritability and anger: Secondary | ICD-10-CM

## 2020-04-11 DIAGNOSIS — F411 Generalized anxiety disorder: Secondary | ICD-10-CM

## 2020-04-15 NOTE — Progress Notes (Signed)
Established Patient Office Visit  Subjective:  Patient ID: Samantha [name], female    DOB: February 01, 1981  Age: 39 y.o. MRN: [id number]  CC: No chief complaint on file.   HPI Samantha [name] presents for chronic follow-up on diabetes, hypertension, and mood.  Diabetes: Pt denies increased urination or thirst. Pt reports medication compliance. No hypoglycemic events. Checks glucose when not feeling well. Reports she has eliminated sodas, only drinks water, and tries to eat less carbohydrates.    HTN: Pt denies new onset chest pain, palpitations, dizziness or lower extremity swelling. Taking medication as directed without side effects. Checks BP at home  and readings range in 120s/70s.  Mood: Doing well and requests refill of Prozac.  Past Medical History:  Diagnosis Date  . Anemia   . Angio-edema   . Anxiety   . Complication of anesthesia    slow to wake up  . Depression    rx presc but not taken  . Diabetes mellitus without complication ([place])   . Fibromyalgia   . GERD (gastroesophageal reflux disease)    no meds  . Headache(784.0)    tension and with anxiety hx  . Hepatic steatosis   . History of kidney stones   . MVP (mitral valve prolapse)    echo 10  . Pneumonia    hx  . Seizures ([place])    non epileptic events per epilepsy monitoring unit  . Seizures ([place])    last seizure July 2015- does not convulse but has a sleep effect  . Sinus infection    recent on antibiotic  . Sleep apnea    has cpap does not use    Past Surgical History:  Procedure Laterality Date  . ABDOMINAL HYSTERECTOMY N/A 08/15/2014   Procedure: HYSTERECTOMY ABDOMINAL WITH CYSTOSCOPY;  Surgeon: Sanjuana Kava, MD;  Location: [facility];  Service: Gynecology;  Laterality: N/A;  . CESAREAN SECTION N/A   . CYSTOSCOPY/RETROGRADE/URETEROSCOPY Left 05/23/2019   Procedure: CYSTOSCOPY/RETROGRADE/URETEROSCOPY;  Surgeon: Abbie Sons, MD;  Location: [facility];  Service: Urology;  Laterality: Left;  . KNEE ARTHROSCOPY     . SHOULDER ARTHROSCOPY WITH SUBACROMIAL DECOMPRESSION AND OPEN ROTATOR C Left 11/27/2015   Procedure: LEFT SHOULDER ARTHROSCOPY WITH SUBACROMIAL DECOMPRESSION, MINI OPEN ROTATOR CUFF REPAIR;  Surgeon: Netta Cedars, MD;  Location: [facility];  Service: Orthopedics;  Laterality: Left;  . TUBAL LIGATION      Family History  Problem Relation Age of Onset  . Diabetes Mother   . Hypertension Mother   . Healthy Father   . Breast cancer Maternal Grandmother   . Cancer Maternal Grandfather        brain, lung, liver  . Heart attack Paternal Grandfather     Social History   Socioeconomic History  . Marital status: Married    Spouse name: Not on file  . Number of children: Not on file  . Years of education: Not on file  . Highest education level: Not on file  Occupational History  . Not on file  Tobacco Use  . Smoking status: Never Smoker  . Smokeless tobacco: Never Used  Substance and Sexual Activity  . Alcohol use: No  . Drug use: No  . Sexual activity: Yes    Birth control/protection: Surgical  Other Topics Concern  . Not on file  Social History Narrative  . Not on file   Social Determinants of Health   Financial Resource Strain:   . Difficulty of Paying Living Expenses:   Food Insecurity:   .  Worried About Charity fundraiser in the Last Year:   . Arboriculturist in the Last Year:   Transportation Needs:   . Film/video editor (Medical):   Marland Kitchen Lack of Transportation (Non-Medical):   Physical Activity:   . Days of Exercise per Week:   . Minutes of Exercise per Session:   Stress:   . Feeling of Stress :   Social Connections:   . Frequency of Communication with Friends and Family:   . Frequency of Social Gatherings with Friends and Family:   . Attends Religious Services:   . Active Member of Clubs or Organizations:   . Attends Archivist Meetings:   Marland Kitchen Marital Status:   Intimate Partner Violence:   . Fear of Current or Ex-Partner:   . Emotionally Abused:    Marland Kitchen Physically Abused:   . Sexually Abused:     Outpatient Medications Prior to Visit  Medication Sig Dispense Refill  . amitriptyline (ELAVIL) 10 MG tablet Take 30 mg by mouth at bedtime.     Marland Kitchen aspirin EC 81 MG tablet Take 1 tablet (81 mg total) by mouth daily. 90 tablet 3  . blood glucose meter kit and supplies Dispense based on patient and insurance preference. Use to check blood glucose levels fasting in the morning and 2 hours after largest meal daily.  (FOR ICD-10 E10.9, E11.9). 1 each 0  . FLUoxetine (PROZAC) 20 MG tablet TAKE 1 TABLET BY MOUTH DAILY 90 tablet 0  . hyoscyamine (LEVSIN SL) 0.125 MG SL tablet 1 tablet sublingual every 6-8 hours as needed for abdominal pain. 30 tablet 1  . liraglutide (VICTOZA) 18 MG/3ML SOPN 1.59m Kidder qd (Patient taking differently: Inject 1.2 mg into the skin daily. ) 6 pen 1  . metoprolol succinate (TOPROL XL) 25 MG 24 hr tablet Take 0.5 tablets (12.5 mg total) by mouth daily. 45 tablet 1  . mupirocin ointment (BACTROBAN) 2 % Apply to affected area TID for 10 days. (Patient taking differently: Apply to affected area prn) 30 g 0  . ondansetron (ZOFRAN) 4 MG tablet Take 1 tablet (4 mg total) by mouth every 8 (eight) hours as needed for nausea or vomiting. 30 tablet 0  . Turmeric 500 MG TABS Take 500 mg by mouth daily.    . valACYclovir (VALTREX) 500 MG tablet Take 500 mg by mouth daily as needed (cold sores).     . Vitamin D, Ergocalciferol, (DRISDOL) 1.25 MG (50000 UT) CAPS capsule Take one tablet wkly 12 capsule 3   No facility-administered medications prior to visit.    Allergies  Allergen Reactions  . Sulfa Antibiotics Anaphylaxis and Swelling  . Topiramate Swelling    Tongue  . Duloxetine Hcl Other (See Comments)    Weight gain, feels "crazy"  . Ibuprofen     Due to taking Elavil, causes counter headaches when she takes Ibuprofen  . Metformin And Related Other (See Comments)    GI Upset  . Penicillins Rash    Has patient had a PCN reaction  causing immediate rash, facial/tongue/throat swelling, SOB or lightheadedness with hypotension: Yes Has patient had a PCN reaction causing severe rash involving mucus membranes or skin necrosis: No Has patient had a PCN reaction that required hospitalization No Has patient had a PCN reaction occurring within the last 10 years: No If all of the above answers are "NO", then may proceed with Cephalosporin use.     ROS Review of Systems:  A fourteen system review  of systems was performed and found to be positive as per HPI.  Objective:    Physical Exam  General: Well nourished, in no apparent distress. Eyes: PERRLA, EOMs, conjunctiva clr Resp: Respiratory effort- normal, ECTA B/L w/o W/R/R  Cardio: RRR  Abdomen: no gross distention. Lymphatics:  less 2 sec cap RF M-sk: Full ROM, 5/5 strength, normal gait.  Skin: Warm, dry  Neuro: Alert, Oriented Psych: Normal affect, Insight and Judgment appropriate.   LMP 08/15/2014 Comment: partial hysterectomy Wt Readings from Last 3 Encounters:  04/08/20 213 lb 2 oz (96.7 kg)  02/18/20 207 lb (93.9 kg)  01/07/20 210 lb 8 oz (95.5 kg)     Health Maintenance Due  Topic Date Due  . OPHTHALMOLOGY EXAM  Never done  . COVID-19 Vaccine (1) Never done  . URINE MICROALBUMIN  04/07/2019  . FOOT EXAM  07/13/2019  . HEMOGLOBIN A1C  04/08/2020    There are no preventive care reminders to display for this patient.  Lab Results  Component Value Date   TSH 0.730 10/10/2019   Lab Results  Component Value Date   WBC 7.0 03/19/2020   HGB 13.0 03/19/2020   HCT 39.2 03/19/2020   MCV 83.6 03/19/2020   PLT 171.0 03/19/2020   Lab Results  Component Value Date   NA 134 (L) 02/18/2020   K 4.1 02/18/2020   CO2 25 02/18/2020   GLUCOSE 157 (H) 02/18/2020   BUN 12 02/18/2020   CREATININE 0.64 02/18/2020   BILITOT 0.3 02/18/2020   ALKPHOS 72 02/18/2020   AST 12 02/18/2020   ALT 14 02/18/2020   PROT 7.7 02/18/2020   ALBUMIN 4.1 02/18/2020    CALCIUM 9.6 02/18/2020   ANIONGAP 9 08/09/2019   GFR 102.41 02/18/2020   Lab Results  Component Value Date   CHOL 133 10/10/2019   Lab Results  Component Value Date   HDL 44 10/10/2019   Lab Results  Component Value Date   LDLCALC 62 10/10/2019   Lab Results  Component Value Date   TRIG 161 (H) 10/10/2019   Lab Results  Component Value Date   CHOLHDL 3.0 10/10/2019   Lab Results  Component Value Date   HGBA1C 6.8 (H) 10/10/2019      Assessment & Plan:   Problem List Items Addressed This Visit      Endocrine   Type 2 diabetes mellitus with complication, without long-term current use of insulin (HCC) - Primary (Chronic)     Other   GAD (generalized anxiety disorder)    Other Visit Diagnoses    Hypertension associated with diabetes ([place])         Diabetes: - A1c today is 6.9, stable. - Continue Victoza 18 mg/3 ml 1.2 mg - Encourage ambulatory glucose monitoring and notify clinic if FBS consistently <80 or >160. - Continue low glucose/carbohydrate die and increase physical activity as tolerated.   HTN: - BP today is 136/83, stable. - Continue Metoprolol succinate 25 mg 0.5 tablet - Continue ambulatory BP and pulse monitoring. - Encourage DASH diet.  Mood, GAD: - Well controlled, GAD-7 score of 0 and PHQ9 score of 1 - Continue Prozac 20 mg.  No orders of the defined types were placed in this encounter.   Follow-up: Return for CPE and FBW in 4-6 months.    Lorrene Reid, PA-C

## 2020-04-16 ENCOUNTER — Other Ambulatory Visit: Payer: Self-pay

## 2020-04-16 ENCOUNTER — Ambulatory Visit (INDEPENDENT_AMBULATORY_CARE_PROVIDER_SITE_OTHER): Payer: PPO | Admitting: Physician Assistant

## 2020-04-16 ENCOUNTER — Encounter: Payer: Self-pay | Admitting: Physician Assistant

## 2020-04-16 VITALS — BP 136/83 | HR 83 | Temp 97.8°F | Ht 69.0 in | Wt 211.8 lb

## 2020-04-16 DIAGNOSIS — I152 Hypertension secondary to endocrine disorders: Secondary | ICD-10-CM

## 2020-04-16 DIAGNOSIS — E1159 Type 2 diabetes mellitus with other circulatory complications: Secondary | ICD-10-CM | POA: Diagnosis not present

## 2020-04-16 DIAGNOSIS — F411 Generalized anxiety disorder: Secondary | ICD-10-CM

## 2020-04-16 DIAGNOSIS — I10 Essential (primary) hypertension: Secondary | ICD-10-CM

## 2020-04-16 DIAGNOSIS — E118 Type 2 diabetes mellitus with unspecified complications: Secondary | ICD-10-CM

## 2020-04-16 DIAGNOSIS — R454 Irritability and anger: Secondary | ICD-10-CM | POA: Diagnosis not present

## 2020-04-16 LAB — POCT UA - MICROALBUMIN
Albumin/Creatinine Ratio, Urine, POC: <30
Creatinine, POC: 200 mg/dL
Microalbumin Ur, POC: 30 mg/L

## 2020-04-16 LAB — POCT GLYCOSYLATED HEMOGLOBIN (HGB A1C): Hemoglobin A1C: 6.9 % — AB (ref 4.0–5.6)

## 2020-04-16 MED ORDER — FLUOXETINE HCL 20 MG PO TABS: 20.0000 mg | ORAL_TABLET | Freq: Every day | ORAL | 1 refills | Status: DC

## 2020-04-16 NOTE — Patient Instructions (Addendum)
Check out the DASH diet = 1.5 Gram Low Sodium Diet   A 1.5 gram sodium diet restricts the amount of sodium in the diet to no more than 1.5 g or 1500 mg daily.  The American Heart Association recommends Americans over the age of 5 to consume no more than 1500 mg of sodium each day to reduce the risk of developing high blood pressure.  Research also shows that limiting sodium may reduce heart attack and stroke risk.  Many foods contain sodium for flavor and sometimes as a preservative.  When the amount of sodium in a diet needs to be low, it is important to know what to look for when choosing foods and drinks.  The following includes some information and guidelines to help make it easier for you to adapt to a low sodium diet.    QUICK TIPS  Do not add salt to food.  Avoid convenience items and fast food.  Choose unsalted snack foods.  Buy lower sodium products, often labeled as "lower sodium" or "no salt added."  Check food labels to learn how much sodium is in 1 serving.  When eating at a restaurant, ask that your food be prepared with less salt or none, if possible.    READING FOOD LABELS FOR SODIUM INFORMATION  The nutrition facts label is a good place to find how much sodium is in foods. Look for products with no more than 400 mg of sodium per serving.  Remember that 1.5 g = 1500 mg.  The food label may also list foods as:  Sodium-free: Less than 5 mg in a serving.  Very low sodium: 35 mg or less in a serving.  Low-sodium: 140 mg or less in a serving.  Light in sodium: 50% less sodium in a serving. For example, if a food that usually has 300 mg of sodium is changed to become light in sodium, it will have 150 mg of sodium.  Reduced sodium: 25% less sodium in a serving. For example, if a food that usually has 400 mg of sodium is changed to reduced sodium, it will have 300 mg of sodium.    CHOOSING FOODS  Grains  Avoid: Salted crackers and snack items. Some cereals, including instant hot  cereals. Bread stuffing and biscuit mixes. Seasoned rice or pasta mixes.  Choose: Unsalted snack items. Low-sodium cereals, oats, puffed wheat and rice, shredded wheat. English muffins and bread. Pasta.  Meats  Avoid: Salted, canned, smoked, spiced, pickled meats, including fish and poultry. Bacon, ham, sausage, cold cuts, hot dogs, anchovies.  Choose: Low-sodium canned tuna and salmon. Fresh or frozen meat, poultry, and fish.  Dairy  Avoid: Processed cheese and spreads. Cottage cheese. Buttermilk and condensed milk. Regular cheese.  Choose: Milk. Low-sodium cottage cheese. Yogurt. Sour cream. Low-sodium cheese.  Fruits and Vegetables  Avoid: Regular canned vegetables. Regular canned tomato sauce and paste. Frozen vegetables in sauces. Olives. Angie Fava. Relishes. Sauerkraut.  Choose: Low-sodium canned vegetables. Low-sodium tomato sauce and paste. Frozen or fresh vegetables. Fresh and frozen fruit.  Condiments  Avoid: Canned and packaged gravies. Worcestershire sauce. Tartar sauce. Barbecue sauce. Soy sauce. Steak sauce. Ketchup. Onion, garlic, and table salt. Meat flavorings and tenderizers.  Choose: Fresh and dried herbs and spices. Low-sodium varieties of mustard and ketchup. Lemon juice. Tabasco sauce. Horseradish.    SAMPLE 1.5 GRAM SODIUM MEAL PLAN:   Breakfast / Sodium (mg)  1 cup low-fat milk / 143 mg  1 whole-wheat English muffin / 240 mg  1 tbs heart-healthy margarine / 153 mg  1 hard-boiled egg / 139 mg  1 small orange / 0 mg  Lunch / Sodium (mg)  1 cup raw carrots / 76 mg  2 tbs no salt added peanut butter / 5 mg  2 slices whole-wheat bread / 270 mg  1 tbs jelly / 6 mg   cup red grapes / 2 mg  Dinner / Sodium (mg)  1 cup whole-wheat pasta / 2 mg  1 cup low-sodium tomato sauce / 73 mg  3 oz lean ground beef / 57 mg  1 small side salad (1 cup raw spinach leaves,  cup cucumber,  cup yellow bell pepper) with 1 tsp olive oil and 1 tsp red wine vinegar / 25 mg  Snack /  Sodium (mg)  1 container low-fat vanilla yogurt / 107 mg  3 graham cracker squares / 127 mg  Nutrient Analysis  Calories: 1745  Protein: 75 g  Carbohydrate: 237 g  Fat: 57 g  Sodium: 1425 mg  Document Released: 11/21/2005 Document Revised: 08/03/2011 Document Reviewed: 02/22/2010  [facility] : [facility] Patient Information 2012 [place], [place].    Healthy Eating Following a healthy eating pattern may help you to achieve and maintain a healthy body weight, reduce the risk of chronic disease, and live a long and productive life. It is important to follow a healthy eating pattern at an appropriate calorie level for your body. Your nutritional needs should be met primarily through food by choosing a variety of nutrient-rich foods. What are tips for following this plan? Reading food labels  Read labels and choose the following: ? Reduced or low sodium. ? Juices with 100% fruit juice. ? Foods with low saturated fats and high polyunsaturated and monounsaturated fats. ? Foods with whole grains, such as whole wheat, cracked wheat, brown rice, and wild rice. ? Whole grains that are fortified with folic acid. This is recommended for women who are pregnant or who want to become pregnant.  Read labels and avoid the following: ? Foods with a lot of added sugars. These include foods that contain brown sugar, corn sweetener, corn syrup, dextrose, fructose, glucose, high-fructose corn syrup, honey, invert sugar, lactose, malt syrup, maltose, molasses, raw sugar, sucrose, trehalose, or turbinado sugar.  Do not eat more than the following amounts of added sugar per day:  6 teaspoons (25 g) for women.  9 teaspoons (38 g) for men. ? Foods that contain processed or refined starches and grains. ? Refined grain products, such as white flour, degermed cornmeal, white bread, and white rice. Shopping  Choose nutrient-rich snacks, such as vegetables, whole fruits, and nuts. Avoid high-calorie and high-sugar snacks, such  as potato chips, fruit snacks, and candy.  Use oil-based dressings and spreads on foods instead of solid fats such as butter, stick margarine, or cream cheese.  Limit pre-made sauces, mixes, and "instant" products such as flavored rice, instant noodles, and ready-made pasta.  Try more plant-protein sources, such as tofu, tempeh, black beans, edamame, lentils, nuts, and seeds.  Explore eating plans such as the Mediterranean diet or vegetarian diet. Cooking  Use oil to saut or stir-fry foods instead of solid fats such as butter, stick margarine, or lard.  Try baking, boiling, grilling, or broiling instead of frying.  Remove the fatty part of meats before cooking.  Steam vegetables in water or broth. Meal planning   At meals, imagine dividing your plate into fourths: ? One-half of your plate is fruits and vegetables. ? One-fourth of  your plate is whole grains. ? One-fourth of your plate is protein, especially lean meats, poultry, eggs, tofu, beans, or nuts.  Include low-fat dairy as part of your daily diet. Lifestyle  Choose healthy options in all settings, including home, work, school, restaurants, or stores.  Prepare your food safely: ? Wash your hands after handling raw meats. ? Keep food preparation surfaces clean by regularly washing with hot, soapy water. ? Keep raw meats separate from ready-to-eat foods, such as fruits and vegetables. ? Cook seafood, meat, poultry, and eggs to the recommended internal temperature. ? Store foods at safe temperatures. In general:  Keep cold foods at 70F (4.4C) or below.  Keep hot foods at 170F (60C) or above.  Keep your freezer at [facility] (-17.8C) or below.  Foods are no longer safe to eat when they have been between the temperatures of 40-170F (4.4-60C) for more than 2 hours. What foods should I eat? Fruits Aim to eat 2 cup-equivalents of fresh, canned (in natural juice), or frozen fruits each day. Examples of 1 cup-equivalent  of fruit include 1 small apple, 8 large strawberries, 1 cup canned fruit,  cup dried fruit, or 1 cup 100% juice. Vegetables Aim to eat 2-3 cup-equivalents of fresh and frozen vegetables each day, including different varieties and colors. Examples of 1 cup-equivalent of vegetables include 2 medium carrots, 2 cups raw, leafy greens, 1 cup chopped vegetable (raw or cooked), or 1 medium baked potato. Grains Aim to eat 6 ounce-equivalents of whole grains each day. Examples of 1 ounce-equivalent of grains include 1 slice of bread, 1 cup ready-to-eat cereal, 3 cups popcorn, or  cup cooked rice, pasta, or cereal. Meats and other proteins Aim to eat 5-6 ounce-equivalents of protein each day. Examples of 1 ounce-equivalent of protein include 1 egg, 1/2 cup nuts or seeds, or 1 tablespoon (16 g) peanut butter. A cut of meat or fish that is the size of a deck of cards is about 3-4 ounce-equivalents.  Of the protein you eat each week, try to have at least 8 ounces come from seafood. This includes salmon, trout, herring, and anchovies. Dairy Aim to eat 3 cup-equivalents of fat-free or low-fat dairy each day. Examples of 1 cup-equivalent of dairy include 1 cup (240 mL) milk, 8 ounces (250 g) yogurt, 1 ounces (44 g) natural cheese, or 1 cup (240 mL) fortified soy milk. Fats and oils  Aim for about 5 teaspoons (21 g) per day. Choose monounsaturated fats, such as canola and olive oils, avocados, peanut butter, and most nuts, or polyunsaturated fats, such as sunflower, corn, and soybean oils, walnuts, pine nuts, sesame seeds, sunflower seeds, and flaxseed. Beverages  Aim for six 8-oz glasses of water per day. Limit coffee to three to five 8-oz cups per day.  Limit caffeinated beverages that have added calories, such as soda and energy drinks.  Limit alcohol intake to no more than 1 drink a day for nonpregnant women and 2 drinks a day for men. One drink equals 12 oz of beer (355 mL), 5 oz of wine (148 mL), or 1  oz of hard liquor (44 mL). Seasoning and other foods  Avoid adding excess amounts of salt to your foods. Try flavoring foods with herbs and spices instead of salt.  Avoid adding sugar to foods.  Try using oil-based dressings, sauces, and spreads instead of solid fats. This information is based on general [place] nutrition guidelines. For more information, visit Build[employer].es. Exact amounts may vary based on your nutrition  needs. Summary  A healthy eating plan may help you to maintain a healthy weight, reduce the risk of chronic diseases, and stay active throughout your life.  Plan your meals. Make sure you eat the right portions of a variety of nutrient-rich foods.  Try baking, boiling, grilling, or broiling instead of frying.  Choose healthy options in all settings, including home, work, school, restaurants, or stores. This information is not intended to replace advice given to you by your health care provider. Make sure you discuss any questions you have with your health care provider. Document Revised: 03/05/2018 Document Reviewed: 03/05/2018 Elsevier Patient Education  [place].

## 2020-05-06 IMAGING — XA Imaging study
2 series · 2 of 2 positions shown · non-contrast
Comparison: none

CLINICAL DATA: Lumbosacral spondylosis without myelopathy. RIGHT
greater than LEFT radicular symptoms. Mild multilevel spondylosis.

[Series 1: ortho standard · 1 of 1 slices shown (1 of 2)]
[im 1/1]
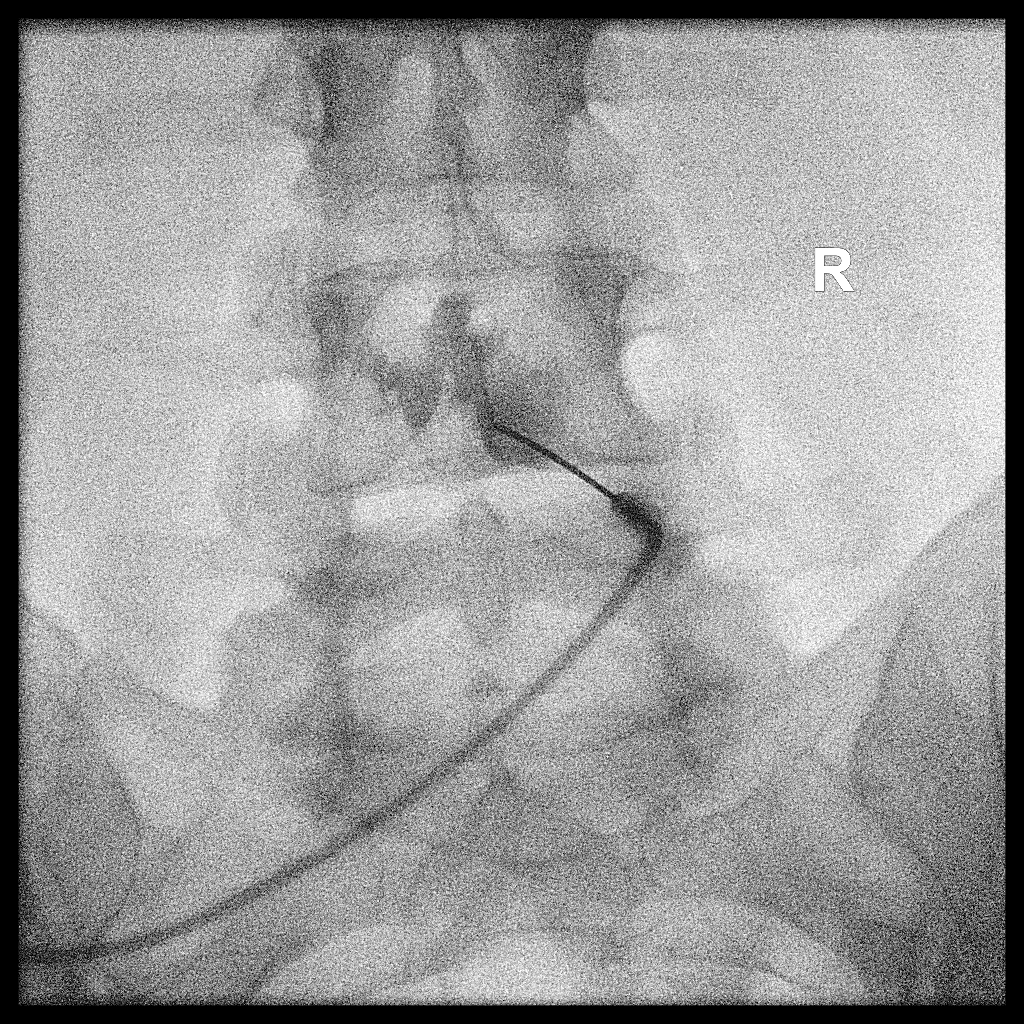

[Series 2: ortho standard · 1 of 1 slices shown (2 of 2)]
[im 1/1]
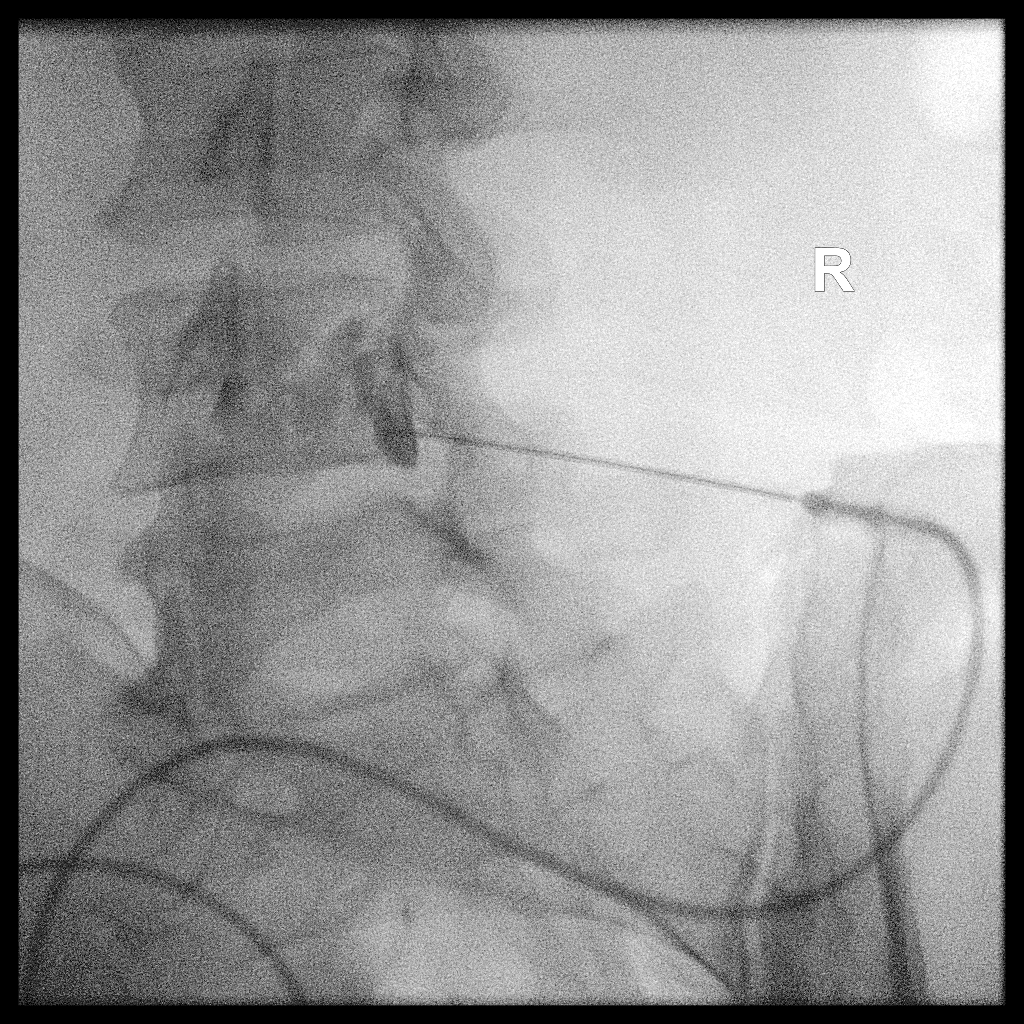

[2 of 2 positions shown; findings below may reference images not displayed]

FLUOROSCOPY TIME:  13 seconds corresponding to a Dose Area Product
of 22.3 Gy*m2

PROCEDURE:
The procedure, risks, benefits, and alternatives were explained to
the patient. Questions regarding the procedure were encouraged and
answered. The patient understands and consents to the procedure.

LUMBAR EPIDURAL INJECTION:

An interlaminar approach was performed on RIGHT at L4-5. The
overlying skin was cleansed and anesthetized. A 20 gauge epidural
needle was advanced using loss-of-resistance technique.

DIAGNOSTIC EPIDURAL INJECTION:

Injection of Isovue-M 200 shows a good epidural pattern with spread
above and below the level of needle placement, primarily on the
RIGHT; no vascular opacification is seen.

THERAPEUTIC EPIDURAL INJECTION:

120.0 mg of Depo-Medrol mixed with 2 mL 1% lidocaine were instilled.
The procedure was well-tolerated, and the patient was discharged
thirty minutes following the injection in good condition.

COMPLICATIONS:
None.
IMPRESSION: Technically successful epidural injection on the RIGHT L4-5 # 1.

## 2020-05-18 ENCOUNTER — Other Ambulatory Visit: Payer: Self-pay

## 2020-05-18 ENCOUNTER — Ambulatory Visit (INDEPENDENT_AMBULATORY_CARE_PROVIDER_SITE_OTHER): Payer: PPO

## 2020-05-18 DIAGNOSIS — R079 Chest pain, unspecified: Secondary | ICD-10-CM | POA: Diagnosis not present

## 2020-05-18 DIAGNOSIS — R0602 Shortness of breath: Secondary | ICD-10-CM

## 2020-06-03 ENCOUNTER — Encounter: Payer: Self-pay | Admitting: Nurse Practitioner

## 2020-06-03 ENCOUNTER — Encounter: Payer: Self-pay | Admitting: Physician Assistant

## 2020-06-03 ENCOUNTER — Other Ambulatory Visit: Payer: Self-pay

## 2020-06-03 ENCOUNTER — Ambulatory Visit (INDEPENDENT_AMBULATORY_CARE_PROVIDER_SITE_OTHER): Payer: PPO | Admitting: Physician Assistant

## 2020-06-03 ENCOUNTER — Ambulatory Visit (INDEPENDENT_AMBULATORY_CARE_PROVIDER_SITE_OTHER): Payer: PPO | Admitting: Nurse Practitioner

## 2020-06-03 VITALS — BP 130/86 | HR 110 | Temp 97.6°F | Ht 69.0 in | Wt 212.5 lb

## 2020-06-03 VITALS — BP 116/70 | HR 85 | Ht 69.0 in | Wt 212.2 lb

## 2020-06-03 DIAGNOSIS — I77810 Thoracic aortic ectasia: Secondary | ICD-10-CM | POA: Diagnosis not present

## 2020-06-03 DIAGNOSIS — B9562 Methicillin resistant Staphylococcus aureus infection as the cause of diseases classified elsewhere: Secondary | ICD-10-CM

## 2020-06-03 DIAGNOSIS — L989 Disorder of the skin and subcutaneous tissue, unspecified: Secondary | ICD-10-CM | POA: Diagnosis not present

## 2020-06-03 DIAGNOSIS — L0390 Cellulitis, unspecified: Secondary | ICD-10-CM

## 2020-06-03 DIAGNOSIS — L299 Pruritus, unspecified: Secondary | ICD-10-CM | POA: Diagnosis not present

## 2020-06-03 DIAGNOSIS — R079 Chest pain, unspecified: Secondary | ICD-10-CM

## 2020-06-03 DIAGNOSIS — I10 Essential (primary) hypertension: Secondary | ICD-10-CM

## 2020-06-03 DIAGNOSIS — Z22322 Carrier or suspected carrier of Methicillin resistant Staphylococcus aureus: Secondary | ICD-10-CM | POA: Diagnosis not present

## 2020-06-03 DIAGNOSIS — R0602 Shortness of breath: Secondary | ICD-10-CM | POA: Diagnosis not present

## 2020-06-03 MED ORDER — MUPIROCIN 2 % EX OINT: TOPICAL_OINTMENT | CUTANEOUS | 0 refills | Status: DC

## 2020-06-03 MED ORDER — TRIAMCINOLONE ACETONIDE 0.1 % EX CREA: 1.0000 "application " | TOPICAL_CREAM | Freq: Two times a day (BID) | CUTANEOUS | 0 refills | Status: DC

## 2020-06-03 NOTE — Progress Notes (Signed)
Acute Office Visit  Subjective:    Patient ID: Samantha [name], female    DOB: Jun 13, 1981, 39 y.o.   MRN: [id number]  Chief Complaint  Patient presents with  . Insect Bite    HPI Patient is in today for possible insect bites. Reports there was a spider in her bedroom that she believes she killed but is unsure and is concerned she may have been bitten by the spider.  Reports a small skin lesion on right upper arm on the back that is itchy.  She has another spot on her left side which is larger and is itchy and tender.  Denies fever, chills, altered mental status, muscle weakness or diaphoresis.  Past Medical History:  Diagnosis Date  . Anemia   . Angio-edema   . Anxiety   . Complication of anesthesia    slow to wake up  . Depression    rx presc but not taken  . Diabetes mellitus without complication ([place])   . Dilated aortic root ([place])    a. 05/2020 Echo: Ao root 24m.  . Fibromyalgia   . GERD (gastroesophageal reflux disease)    no meds  . H/o Sinus infection   . Headache(784.0)    tension and with anxiety hx  . Hepatic steatosis   . History of kidney stones   . MVP (mitral valve prolapse)    a. Pt told MVP present as child; b. 05/2020 Echo: EF 55-60%, no rwma, nl RV size/fxn, triv MR, mild AI. Ao root 455m  . Pneumonia    hx  . Seizures ([place]   a. non epileptic events per epilepsy monitoring unit; b. last seizure July 2015- does not convulse but has a sleep effect  . Sleep apnea    has cpap does not use    Past Surgical History:  Procedure Laterality Date  . ABDOMINAL HYSTERECTOMY N/A 08/15/2014   Procedure: HYSTERECTOMY ABDOMINAL WITH CYSTOSCOPY;  Surgeon: WaSanjuana KavaMD;  [facility];  Service: Gynecology;  Laterality: N/A;  . CESAREAN SECTION N/A   . CYSTOSCOPY/RETROGRADE/URETEROSCOPY Left 05/23/2019   Procedure: CYSTOSCOPY/RETROGRADE/URETEROSCOPY;  Surgeon: StAbbie SonsMD;  Location: [facility];  Service: Urology;  Laterality: Left;  . KNEE ARTHROSCOPY     . SHOULDER ARTHROSCOPY WITH SUBACROMIAL DECOMPRESSION AND OPEN ROTATOR C Left 11/27/2015   Procedure: LEFT SHOULDER ARTHROSCOPY WITH SUBACROMIAL DECOMPRESSION, MINI OPEN ROTATOR CUFF REPAIR;  Surgeon: StNetta CedarsMD;  [place] [facility] Service: Orth[place]ics;  Laterality: Left;  . TUBAL LIGATION      Family History  Problem Relation Age of Onset  . Diabetes Mother   . Hypertension Mother   . Healthy Father   . Breast cancer Maternal Grandmother   . Cancer Maternal Grandfather        brain, lung, liver  . Heart attack Paternal Grandfather     Social History   Socioeconomic History  . Marital status: Married    Spouse name: Not on file  . Number of children: Not on file  . Years of education: Not on file  . Highest education level: Not on file  Occupational History  . Not on file  Tobacco Use  . Smoking status: Never Smoker  . Smokeless tobacco: Never Used  Vaping Use  . Vaping Use: Never used  Substance and Sexual Activity  . Alcohol use: No  . Drug use: No  . Sexual activity: Yes    Birth control/protection: Surgical  Other Topics Concern  . Not on  file  Social History Narrative  . Not on file   Social Determinants of Health   Financial Resource Strain:   . Difficulty of Paying Living Expenses:   Food Insecurity:   . Worried About Charity fundraiser in the Last Year:   . Arboriculturist in the Last Year:   Transportation Needs:   . Film/video editor (Medical):   Marland Kitchen Lack of Transportation (Non-Medical):   Physical Activity:   . Days of Exercise per Week:   . Minutes of Exercise per Session:   Stress:   . Feeling of Stress :   Social Connections:   . Frequency of Communication with Friends and Family:   . Frequency of Social Gatherings with Friends and Family:   . Attends Religious Services:   . Active Member of Clubs or Organizations:   . Attends Archivist Meetings:   Marland Kitchen Marital Status:   Intimate Partner Violence:   . Fear of Current or  Ex-Partner:   . Emotionally Abused:   Marland Kitchen Physically Abused:   . Sexually Abused:     Outpatient Medications Prior to Visit  Medication Sig Dispense Refill  . amitriptyline (ELAVIL) 10 MG tablet Take 30 mg by mouth at bedtime.     Marland Kitchen aspirin EC 81 MG tablet Take 1 tablet (81 mg total) by mouth daily. 90 tablet 3  . blood glucose meter kit and supplies Dispense based on patient and insurance preference. Use to check blood glucose levels fasting in the morning and 2 hours after largest meal daily.  (FOR ICD-10 E10.9, E11.9). 1 each 0  . FLUoxetine (PROZAC) 20 MG tablet Take 1 tablet (20 mg total) by mouth daily. 90 tablet 1  . hyoscyamine (LEVSIN SL) 0.125 MG SL tablet 1 tablet sublingual every 6-8 hours as needed for abdominal pain. 30 tablet 1  . liraglutide (VICTOZA) 18 MG/3ML SOPN 1.58m [place] qd (Patient taking differently: Inject 1.2 mg into the skin daily. ) 6 pen 1  . metoprolol succinate (TOPROL XL) 25 MG 24 hr tablet Take 0.5 tablets (12.5 mg total) by mouth daily. 45 tablet 1  . ondansetron (ZOFRAN) 4 MG tablet Take 1 tablet (4 mg total) by mouth every 8 (eight) hours as needed for nausea or vomiting. 30 tablet 0  . Turmeric 500 MG TABS Take 500 mg by mouth daily.    . valACYclovir (VALTREX) 500 MG tablet Take 500 mg by mouth daily as needed (cold sores).     . Vitamin D, Ergocalciferol, (DRISDOL) 1.25 MG (50000 UT) CAPS capsule Take one tablet wkly 12 capsule 3  . mupirocin ointment (BACTROBAN) 2 % Apply to affected area TID for 10 days. (Patient taking differently: Apply to affected area prn) 30 g 0   No facility-administered medications prior to visit.    Allergies  Allergen Reactions  . Sulfa Antibiotics Anaphylaxis and Swelling  . Topiramate Swelling    Tongue  . Duloxetine Hcl Other (See Comments)    Weight gain, feels "crazy"  . Ibuprofen     Due to taking Elavil, causes counter headaches when she takes Ibuprofen  . Metformin And Related Other (See Comments)    GI Upset  .  Penicillins Rash    Has patient had a PCN reaction causing immediate rash, facial/tongue/throat swelling, SOB or lightheadedness with hypotension: Yes Has patient had a PCN reaction causing severe rash involving mucus membranes or skin necrosis: No Has patient had a PCN reaction that required hospitalization No Has  patient had a PCN reaction occurring within the last 10 years: No If all of the above answers are "NO", then may proceed with Cephalosporin use.     Review of Systems Review of Systems:  A fourteen system review of systems was performed and found to be positive as per HPI.   Objective:    Physical Exam General: Well nourished, in no apparent distress. Eyes: PERRLA, EOMs, conjunctiva clr Resp: Respiratory effort- normal, ECTA B/L w/o W/R/R  Cardio: RRR  Abdomen: no gross distention. Lymphatics:  less 2 sec cap RF M-sk: Full ROM, 5/5 strength, normal gait.  Skin: Warm, dry. No signs of necrosis present, small maculopapular lesion on posterior right upper arm, 1 cm x 0.6 cm papular lesion with some fluctuance on right side  Neuro: Alert, Oriented, no focal deficits Psych: Normal affect, Insight and Judgment appropriate.   BP 130/86   Pulse (!) 110   Temp 97.6 F (36.4 C) (Oral)   Ht _0  (1.753 m)   Wt 212 lb 8 oz (96.4 kg)   LMP 08/15/2014 Comment: partial hysterectomy  SpO2 97%   BMI 31.38 kg/m  Wt Readings from Last 3 Encounters:  06/03/20 212 lb 8 oz (96.4 kg)  06/03/20 212 lb 4 oz (96.3 kg)  04/16/20 211 lb 12.8 oz (96.1 kg)    Health Maintenance Due  Topic Date Due  . Hepatitis C Screening  Never done  . OPHTHALMOLOGY EXAM  Never done  . COVID-19 Vaccine (1) Never done  . FOOT EXAM  07/13/2019    There are no preventive care reminders to display for this patient.   Lab Results  Component Value Date   TSH 0.730 10/10/2019   Lab Results  Component Value Date   WBC 7.0 03/19/2020   HGB 13.0 03/19/2020   HCT 39.2 03/19/2020   MCV 83.6  03/19/2020   PLT 171.0 03/19/2020   Lab Results  Component Value Date   NA 134 (L) 02/18/2020   K 4.1 02/18/2020   CO2 25 02/18/2020   GLUCOSE 157 (H) 02/18/2020   BUN 12 02/18/2020   CREATININE 0.64 02/18/2020   BILITOT 0.3 02/18/2020   ALKPHOS 72 02/18/2020   AST 12 02/18/2020   ALT 14 02/18/2020   PROT 7.7 02/18/2020   ALBUMIN 4.1 02/18/2020   CALCIUM 9.6 02/18/2020   ANIONGAP 9 08/09/2019   GFR 102.41 02/18/2020   Lab Results  Component Value Date   CHOL 133 10/10/2019   Lab Results  Component Value Date   HDL 44 10/10/2019   Lab Results  Component Value Date   LDLCALC 62 10/10/2019   Lab Results  Component Value Date   TRIG 161 (H) 10/10/2019   Lab Results  Component Value Date   CHOLHDL 3.0 10/10/2019   Lab Results  Component Value Date   HGBA1C 6.9 (A) 04/16/2020       Assessment & Plan:   Problem List Items Addressed This Visit    None    Visit Diagnoses    Skin lesion    -  Primary   Relevant Medications   triamcinolone cream (KENALOG) 0.1 %   MRSA carrier       Relevant Medications   mupirocin ointment (BACTROBAN) 2 %   (high risk for) Cellulitis due to MRSA  (Chronic)      Relevant Medications   mupirocin ointment (BACTROBAN) 2 %   Itchy skin       Relevant Medications   triamcinolone cream (KENALOG) 0.1 %  Skin lesion, itchy skin lesion, MRSA carrier, high risk for cellulitis due to MRSA: -Due to risk of possible cellulitis since patient is a MRSA carrier will start Bactroban for larger skin lesion on right side. Advised to apply warm compresses. -Instructed to let me know if she starts developing systemic symptoms and will start oral antibiotic. -Apply triamcinolone cream as needed for smaller lesion. -Patient verbalized understanding and all questions were answered.   Meds ordered this encounter  Medications  . mupirocin ointment (BACTROBAN) 2 %    Sig: Apply to affected area TID for 10 days.    Dispense:  30 g    Refill:   0    Order Specific Question:   Supervising Provider    Answer:   Beatrice Lecher D [[id number]]  . triamcinolone cream (KENALOG) 0.1 %    Sig: Apply 1 application topically 2 (two) times daily.    Dispense:  30 g    Refill:  0    Order Specific Question:   Supervising Provider    Answer:   Beatrice Lecher D [[id number]]   Note:  This note was prepared with assistance of Dragon voice recognition software. Occasional wrong-word or sound-a-like substitutions may have occurred due to the inherent limitations of voice recognition software.   Lorrene Reid, PA-C

## 2020-06-03 NOTE — Patient Instructions (Signed)
Medication Instructions:  Your physician recommends that you continue on your current medications as directed. Please refer to the Current Medication list given to you today.  *If you need a refill on your cardiac medications before your next appointment, please call your pharmacy*   Lab Work: Your physician recommends that you return for lab work in: [facility] ([facility]) No appt is needed. Hours are M-F 7AM- 6 PM.  If you have labs (blood work) drawn today and your tests are completely normal, you will receive your results only by: Marland Kitchen MyChart Message (if you have MyChart) OR . A paper copy in the mail If you have any lab test that is abnormal or we need to change your treatment, we will call you to review the results.   Testing/Procedures: 1- [place]  Your caregiver has ordered a Stress Test with nuclear imaging. The purpose of this test is to evaluate the blood supply to your heart muscle. This procedure is referred to as a "Non-Invasive Stress Test." This is because other than having an IV started in your vein, nothing is inserted or "invades" your body. Cardiac stress tests are done to find areas of poor blood flow to the heart by determining the extent of coronary artery disease (CAD). Some patients exercise on a treadmill, which naturally increases the blood flow to your heart, while others who are  unable to walk on a treadmill due to physical limitations have a pharmacologic/chemical stress agent called Lexiscan . This medicine will mimic walking on a treadmill by temporarily increasing your coronary blood flow.   Please note: these test may take anywhere between 2-4 hours to complete  PLEASE REPORT TO [place] AT THE FIRST DESK WILL DIRECT YOU WHERE TO GO  Date of Procedure:_____________________________________  Arrival Time for Procedure:______________________________  Instructions regarding medication:   __x__:  Hold  betablocker(s) night before procedure and morning of procedure (metoprolol)  PLEASE NOTIFY THE OFFICE AT LEAST 24 HOURS IN ADVANCE IF YOU ARE UNABLE TO KEEP YOUR APPOINTMENT.  [phone] AND  PLEASE NOTIFY NUCLEAR MEDICINE AT [facility] AT LEAST 24 HOURS IN ADVANCE IF YOU ARE UNABLE TO KEEP YOUR APPOINTMENT. [phone]  How to prepare for your Myoview test:  1. Do not eat or drink after midnight 2. No caffeine for 24 hours prior to test 3. No smoking 24 hours prior to test. 4. Your medication may be taken with water.  If your doctor stopped a medication because of this test, do not take that medication. 5. Ladies, please do not wear dresses.  Skirts or pants are appropriate. Please wear a short sleeve shirt. 6. No perfume, cologne or lotion. 7. Wear comfortable walking shoes. No heels!        2- CT Aorta Please call imaging to schedule at [phone].  CT Angiography (CTA), is a special type of CT scan that uses a computer to produce multi-dimensional views of major blood vessels throughout the body. In CT angiography, a contrast material is injected through an IV to help visualize the blood vessels  Please arrive at the [place] at 30-45 minutes prior to test start time.   Please follow these instructions carefully (unless otherwise directed):   On the Night Before the Test: . Be sure to Drink plenty of water. . Do not consume any caffeinated/decaffeinated beverages or chocolate 12 hours prior to your test. . Do not take any antihistamines 12 hours prior to  your test.  On the Day of the Test: . Drink plenty of water. Do not drink any water within one hour of the test. . Do not eat any food 4 hours prior to the test. . You may take your regular medications prior to the test.          Follow-Up: At [facility], you and your health needs are our priority.  As part of our continuing mission to provide you with exceptional heart care, we have created  designated Provider Care Teams.  These Care Teams include your primary Cardiologist (physician) and Advanced Practice Providers (APPs -  Physician Assistants and Nurse Practitioners) who all work together to provide you with the care you need, when you need it.  We recommend signing up for the patient portal called "MyChart".  Sign up information is provided on this After Visit Summary.  MyChart is used to connect with patients for Virtual Visits (Telemedicine).  Patients are able to view lab/test results, encounter notes, upcoming appointments, etc.  Non-urgent messages can be sent to your provider as well.   To learn more about what you can do with MyChart, go to [email or web address].    Your next appointment:   3 month(s)  The format for your next appointment:   In Person  Provider:    You may see Nelva Bush, MD or Murray Hodgkins, NP

## 2020-06-03 NOTE — Progress Notes (Signed)
Office Visit    Patient Name: Samantha [name] Date of Encounter: 06/03/2020  Primary Care Provider:  Lorrene Reid, PA-C Primary Cardiologist:  Nelva Bush, MD  Chief Complaint    39 y/o ? w/ a h/o HTN, hyperlipidemia, type 2 diabetes mellitus, morbid obesity, obstructive sleep apnea, GERD, hepatic steatosis, and fibromyalgia, who presents for follow-up of chest pain and palpitations.  Past Medical History    Past Medical History:  Diagnosis Date   Anemia    Angio-edema    Anxiety    Complication of anesthesia    slow to wake up   Depression    rx presc but not taken   Diabetes mellitus without complication ([place])    Dilated aortic root ([place])    a. 05/2020 Echo: Ao root 45m.   Fibromyalgia    GERD (gastroesophageal reflux disease)    no meds   H/o Sinus infection    Headache(784.0)    tension and with anxiety hx   Hepatic steatosis    History of kidney stones    MVP (mitral valve prolapse)    a. Pt told MVP present as child; b. 05/2020 Echo: EF 55-60%, no rwma, nl RV size/fxn, triv MR, mild AI. Ao root 493m   Pneumonia    hx   Seizures ([facility]   a. non epileptic events per epilepsy monitoring unit; b. last seizure July 2015- does not convulse but has a sleep effect   Sleep apnea    has cpap does not use   Past Surgical History:  Procedure Laterality Date   ABDOMINAL HYSTERECTOMY N/A 08/15/2014   Procedure: HYSTERECTOMY ABDOMINAL WITH CYSTOSCOPY;  Surgeon: WaSanjuana KavaMD;  [facility];  Service: Gynecology;  Laterality: N/A;   CESAREAN SECTION N/A    CYSTOSCOPY/RETROGRADE/URETEROSCOPY Left 05/23/2019   Procedure: CYSTOSCOPY/RETROGRADE/URETEROSCOPY;  Surgeon: StAbbie SonsMD;  Location: [facility];  Service: Urology;  Laterality: Left;   KNEE ARTHROSCOPY     SHOULDER ARTHROSCOPY WITH SUBACROMIAL DECOMPRESSION AND OPEN ROTATOR C Left 11/27/2015   Procedure: LEFT SHOULDER ARTHROSCOPY WITH SUBACROMIAL DECOMPRESSION, MINI OPEN ROTATOR  CUFF REPAIR;  Surgeon: StNetta CedarsMD;  [place] [facility] Service: Orthopedics;  Laterality: Left;   TUBAL LIGATION      Allergies  Allergies  Allergen Reactions   Sulfa Antibiotics Anaphylaxis and Swelling   Topiramate Swelling    Tongue   Duloxetine Hcl Other (See Comments)    Weight gain, feels "crazy"   Ibuprofen     Due to taking Elavil, causes counter headaches when she takes Ibuprofen   Metformin And Related Other (See Comments)    GI Upset   Penicillins Rash    Has patient had a PCN reaction causing immediate rash, facial/tongue/throat swelling, SOB or lightheadedness with hypotension: Yes Has patient had a PCN reaction causing severe rash involving mucus membranes or skin necrosis: No Has patient had a PCN reaction that required hospitalization No Has patient had a PCN reaction occurring within the last 10 years: No If all of the above answers are "NO", then may proceed with Cephalosporin use.     History of Present Illness    [age]ear old female with above past medical history including hypertension, hyperlipidemia, diabetes, obesity, sleep apnea, GERD, hepatic steatosis, ?  Mitral valve prolapse, and fibromyalgia.  She was recently evaluated in clinic in May secondary to incidental finding of enlarged heart on CT of the abdomen and pelvis, which was performed in the setting of abdominal pain.  She  was also noted to have multiple ER visits for chest wall pain.  At office visit, she reported both exertional chest discomfort with swelling overlying the suprasternal area as well as intermittent palpitations.  She was tachycardic that day and placed on low-dose metoprolol therapy.  An echocardiogram was performed and showed an EF of 55-60% without regional wall motion abnormalities, trivial MR, mild AI, and a dilated aortic root of 41 mm.  Since her last visit, she has continued to have intermittent discomfort involving her left upper chest, usually occurring with exertion  with which, she notes swelling at the suprasternal area with associated tenderness.  She also experiences dyspnea on exertion.  Palpitations have been quiescent following initiation of beta-blocker therapy.  She is interested in further evaluation of chest pain.  She has some degree of chronic dyspnea on exertion but denies PND, orthopnea, dizziness, syncope, edema, or early satiety.  Home Medications    Prior to Admission medications   Medication Sig Start Date End Date Taking? Authorizing Provider  amitriptyline (ELAVIL) 10 MG tablet Take 30 mg by mouth at bedtime.  07/19/19  Yes [provider]  aspirin EC 81 MG tablet Take 1 tablet (81 mg total) by mouth daily. 04/08/20  Yes End, Harrell Gave, MD  blood glucose meter kit and supplies Dispense based on patient and insurance preference. Use to check blood glucose levels fasting in the morning and 2 hours after largest meal daily.  (FOR ICD-10 E10.9, E11.9). 04/06/18  Yes Opalski, Neoma Laming, DO  FLUoxetine (PROZAC) 20 MG tablet Take 1 tablet (20 mg total) by mouth daily. 04/16/20  Yes Abonza, Maritza, PA-C  hyoscyamine (LEVSIN SL) 0.125 MG SL tablet 1 tablet sublingual every 6-8 hours as needed for abdominal pain. 02/18/20  Yes Kennedy-Smith, Patrecia Pour, NP  liraglutide (VICTOZA) 18 MG/3ML SOPN 1.'2mg'$  [place] qd Patient taking differently: Inject 1.2 mg into the skin daily.  04/17/19  Yes Opalski, Neoma Laming, DO  metoprolol succinate (TOPROL XL) 25 MG 24 hr tablet Take 0.5 tablets (12.5 mg total) by mouth daily. 04/08/20  Yes End, Harrell Gave, MD  mupirocin ointment (BACTROBAN) 2 % Apply to affected area TID for 10 days. Patient taking differently: Apply to affected area prn 12/27/19  Yes Opalski, Deborah, DO  ondansetron (ZOFRAN) 4 MG tablet Take 1 tablet (4 mg total) by mouth every 8 (eight) hours as needed for nausea or vomiting. 02/18/20  Yes Noralyn Pick, NP  Turmeric 500 MG TABS Take 500 mg by mouth daily.   Yes [provider]    valACYclovir (VALTREX) 500 MG tablet Take 500 mg by mouth daily as needed (cold sores).    Yes [provider]  Vitamin D, Ergocalciferol, (DRISDOL) 1.25 MG (50000 UT) CAPS capsule Take one tablet wkly 10/15/19  Yes Opalski, Neoma Laming, DO    Review of Systems    Ongoing somewhat atypical chest discomfort with chest wall swelling and tenderness associated with dyspnea on exertion.  Palpitations have improved following initiation of beta-blocker therapy.  She denies PND, orthopnea, dizziness, syncope, edema, or early satiety..  All other systems reviewed and are otherwise negative except as noted above.  Physical Exam    VS:  BP 116/70 (BP Location: Left Arm, Patient Position: Sitting, Cuff Size: Normal)    Pulse 85    Ht '5\' 9"'$  (1.753 m)    Wt 212 lb 4 oz (96.3 kg)    LMP 08/15/2014 Comment: partial hysterectomy   SpO2 97%    BMI 31.34 kg/m  , BMI  Body mass index is 31.34 kg/m. GEN: Well nourished, well developed, in no acute distress. HEENT: normal. Neck: Supple, no JVD, carotid bruits, or masses. Cardiac: RRR, no murmurs, rubs, or gallops. No clubbing, cyanosis, edema.  Radials/PT 2+ and equal bilaterally.  Respiratory:  Respirations regular and unlabored, clear to auscultation bilaterally. GI: Soft, nontender, nondistended, BS + x 4. MS: no deformity or atrophy. Skin: warm and dry, no rash. Neuro:  Strength and sensation are intact. Psych: Normal affect.  Accessory Clinical Findings    ECG personally reviewed by me today -regular sinus rhythm, 85, left axis deviation, LVH with T wave inversion in V1 through V5.  Poor R wave progression.- no acute changes.  Lab Results  Component Value Date   WBC 7.0 03/19/2020   HGB 13.0 03/19/2020   HCT 39.2 03/19/2020   MCV 83.6 03/19/2020   PLT 171.0 03/19/2020   Lab Results  Component Value Date   CREATININE 0.64 02/18/2020   BUN 12 02/18/2020   NA 134 (L) 02/18/2020   K 4.1 02/18/2020   CL 98 02/18/2020   CO2 25 02/18/2020    Lab Results  Component Value Date   ALT 14 02/18/2020   AST 12 02/18/2020   GGT 15 04/06/2018   ALKPHOS 72 02/18/2020   BILITOT 0.3 02/18/2020   Lab Results  Component Value Date   CHOL 133 10/10/2019   HDL 44 10/10/2019   LDLCALC 62 10/10/2019   TRIG 161 (H) 10/10/2019   CHOLHDL 3.0 10/10/2019    Lab Results  Component Value Date   HGBA1C 6.9 (A) 04/16/2020    Assessment & Plan    1.  Atypical chest pain: Patient recently evaluated in May of this year in the setting of enlarged heart noted on CT.  At the same time, she reported intermittent episodes of chest discomfort and upper sternal swelling and tenderness that was occurring with exertion and associated with dyspnea.  Symptoms last up to an hour or more and resolve with rest.  Echocardiography was performed and showed normal LV function without regional wall motion abnormalities.  Aortic root was noted to be dilated at 41 mm.  We discussed options for management and evaluation of chest pain to potentially include coronary CT angiography versus Lexiscan Myoview.  Her heart rate at her last visit was 190 she was placed on beta-blocker therapy.  Heart rate today is 85.  Average heart rate on her Fitbit is in the 90s.  I am concerned that we may not be able to lower her heart rate effectively enough to obtain adequate imaging for CT.  In that setting, we will pursue Lexiscan Myoview.  Continue aspirin.  2.  Essential hypertension: Blood pressure stable on low-dose beta-blocker therapy.  3.  Palpitations: Quiescent following initiation of beta-blocker therapy.  4.  Dilated aortic root: Echo showed a dilated aortic root of 41 mm.  Follow-up imaging recommended.  We will obtain a basic metabolic panel today and arrange for CT angiography of the chest.  Blood pressure stable as above.  5.  Disposition: Follow up stress testing and CT angio of the aorta as outlined above.  Follow-up in clinic in 3 months or sooner if  necessary.  Murray Hodgkins, NP 06/03/2020, 5:45 PM

## 2020-06-03 NOTE — Patient Instructions (Signed)
Insect Bite, Adult An insect bite can make your skin red, itchy, and swollen. An insect bite is different from an insect sting, which happens when an insect injects poison (venom) into the skin. Some insects can spread disease to people through a bite. However, most insect bites do not lead to disease and are not serious. What are the causes? Insects may bite for a variety of reasons, including:  Hunger.  To defend themselves. Insects that bite include:  Spiders.  Mosquitoes.  Ticks.  Fleas.  Ants.  Flies.  Kissing bugs.  Chiggers. What are the signs or symptoms? Symptoms of this condition include:  Itching or pain in the bite area.  Redness and swelling in the bite area.  An open wound (skin ulcer). In many cases, symptoms last for 2-4 days. In rare cases, a person may have a severe allergic reaction (anaphylactic reaction) to a bite. Symptoms of an anaphylactic reaction may include:  Feeling warm in the face (flushed). This may include redness.  Itchy, red, swollen areas of skin (hives).  Swelling of the eyes, lips, face, mouth, tongue, or throat.  Difficulty breathing, speaking, or swallowing.  Noisy breathing (wheezing).  Dizziness or light-headedness.  Fainting.  Pain or cramping in the abdomen.  Vomiting.  Diarrhea. How is this diagnosed? This condition is usually diagnosed based on symptoms and a physical exam. How is this treated? Treatment is usually not needed. Symptoms often go away on their own. When treatment is recommended, it may involve:  Applying a cream or lotion to the bite area. This treatment helps with itching.  Taking an antibiotic medicine. This treatment is needed if the bite area gets infected.  Getting a tetanus shot, if you are not up to date on this vaccine.  Applying ice to the affected area.  Allergy medicines called antihistamines. This treatment may be needed if you develop itching or an allergic reaction to the  insect bite.  Giving yourself an epinephrine injection if you have an anaphylactic reaction to a bite. To give the injection, you will use what is commonly called an auto-injector "pen" (pre-filled automatic epinephrine injection device). Your health care provider will teach you how to use an auto-injector pen. Follow these instructions at home: Bite area care   Do not scratch the bite area.  Keep the bite area clean and dry. Wash it every day with soap and water as told by your health care provider.  Check the bite area every day for signs of infection. Check for: ? Redness, swelling, or pain. ? Fluid or blood. ? Warmth. ? Pus or a bad smell. Managing pain, itching, and swelling   You may apply cortisone cream, calamine lotion, or a paste made of baking soda and water to the bite area as told by your health care provider.  If directed, put ice on the bite area. ? Put ice in a plastic bag. ? Place a towel between your skin and the bag. ? Leave the ice on for 20 minutes, 2-3 times a day. General instructions  Apply or take over-the-counter and prescription medicines only as told by your health care provider.  If you were prescribed an antibiotic medicine, take or apply it as told by your health care provider. Do not stop using the antibiotic even if your condition improves.  Keep all follow-up visits as told by your health care provider. This is important. How is this prevented? To help reduce your risk of insect bites:  When you are outdoors,   wear clothing that covers your arms and legs. This is especially important in the early morning and evening.  Use insect repellent. The best insect repellents contain DEET, picaridin, oil of lemon eucalyptus (OLE), or IR3535.  Consider spraying your clothing with a pesticide called permethrin. Permethrin helps prevent insect bites. It works for several weeks and for up to 5-6 clothing washes. Do not apply permethrin directly to the  skin.  If your home windows do not have screens, consider installing them.  If you will be sleeping in an area where there are mosquitoes, consider covering your sleeping area with a mosquito net. Contact a health care provider if:  You have redness, swelling, or pain in the bite area.  You have fluid or blood coming from the bite area.  The bite area feels warm to the touch.  You have pus or a bad smell coming from the bite area.  You have a fever. Get help right away if:  You have joint pain.  You have a rash.  You feel unusually tired or sleepy.  You have neck pain.  You have a headache.  You have unusual weakness.  You develop symptoms of an anaphylactic reaction. These may include: ? Flushed skin. ? Hives. ? Swelling of the eyes, lips, face, mouth, tongue, or throat. ? Difficulty breathing, speaking, or swallowing. ? Wheezing. ? Dizziness or light-headedness. ? Fainting. ? Pain or cramping in the abdomen. ? Vomiting. ? Diarrhea. These symptoms may represent a serious problem that is an emergency. Do not wait to see if the symptoms will go away. Do the following right away:  Use the auto-injector pen as you have been instructed.  Get medical help. Call your local emergency services (911 in the [place] Do not drive yourself to the hospital. Summary  An insect bite can make your skin red, itchy, and swollen.  Treatment is usually not needed. Symptoms often go away on their own. When treatment is recommended, it may involve taking medicine, applying medicine to the area, or applying ice.  Apply or take over-the-counter and prescription medicines only as told by your health care provider.  Use insect repellent to help prevent insect bites.  Contact a health care provider if you have any signs of infection in the bite area. This information is not intended to replace advice given to you by your health care provider. Make sure you discuss any questions you have  with your health care provider. Document Revised: 06/01/2018 Document Reviewed: 06/01/2018 Elsevier Patient Education  2020 Elsevier Inc.  

## 2020-06-09 ENCOUNTER — Encounter
Admission: RE | Admit: 2020-06-09 | Discharge: 2020-06-09 | Disposition: A | Discharge status: Home / Self Care | Payer: PPO | Source: Ambulatory Visit | Attending: Nurse Practitioner | Admitting: Nurse Practitioner

## 2020-06-09 ENCOUNTER — Other Ambulatory Visit: Payer: Self-pay

## 2020-06-09 DIAGNOSIS — R0602 Shortness of breath: Secondary | ICD-10-CM | POA: Insufficient documentation

## 2020-06-09 LAB — NM MYOCAR MULTI W/SPECT W/WALL MOTION / EF
LV dias vol: 130 mL (ref 46–106)
LV sys vol: 47 mL
Peak HR: 107 {beats}/min
Rest HR: 75 {beats}/min
TID: 0.95

## 2020-06-09 MED ORDER — TECHNETIUM TC 99M TETROFOSMIN IV KIT
30.0000 | PACK | Freq: Once | INTRAVENOUS | Status: AC | PRN
  Administered 2020-06-09: 32.276 via INTRAVENOUS

## 2020-06-09 MED ORDER — TECHNETIUM TC 99M TETROFOSMIN IV KIT
10.8800 | PACK | Freq: Once | INTRAVENOUS | Status: AC | PRN
  Administered 2020-06-09: 10.88 via INTRAVENOUS

## 2020-06-09 MED ORDER — REGADENOSON 0.4 MG/5ML IV SOLN
0.4000 mg | Freq: Once | INTRAVENOUS | Status: AC
  Administered 2020-06-09: 0.4 mg via INTRAVENOUS

## 2020-07-03 ENCOUNTER — Ambulatory Visit: Admission: RE | Admit: 2020-07-03 | Payer: PPO | Source: Ambulatory Visit

## 2020-07-15 ENCOUNTER — Ambulatory Visit: Payer: PPO | Admitting: Physician Assistant

## 2020-07-28 DIAGNOSIS — Z79899 Other long term (current) drug therapy: Secondary | ICD-10-CM | POA: Diagnosis not present

## 2020-07-28 DIAGNOSIS — R110 Nausea: Secondary | ICD-10-CM | POA: Diagnosis not present

## 2020-07-28 DIAGNOSIS — G43709 Chronic migraine without aura, not intractable, without status migrainosus: Secondary | ICD-10-CM | POA: Diagnosis not present

## 2020-08-06 ENCOUNTER — Other Ambulatory Visit: Payer: Self-pay | Admitting: Physician Assistant

## 2020-08-06 DIAGNOSIS — M2241 Chondromalacia patellae, right knee: Secondary | ICD-10-CM | POA: Diagnosis not present

## 2020-08-06 DIAGNOSIS — I10 Essential (primary) hypertension: Secondary | ICD-10-CM

## 2020-08-06 DIAGNOSIS — Z0000 Encounter for general adult medical examination without abnormal findings: Secondary | ICD-10-CM

## 2020-08-06 DIAGNOSIS — E118 Type 2 diabetes mellitus with unspecified complications: Secondary | ICD-10-CM

## 2020-08-11 ENCOUNTER — Other Ambulatory Visit: Payer: PPO

## 2020-08-12 ENCOUNTER — Other Ambulatory Visit: Payer: Self-pay

## 2020-08-12 ENCOUNTER — Other Ambulatory Visit: Payer: PPO

## 2020-08-12 DIAGNOSIS — I10 Essential (primary) hypertension: Secondary | ICD-10-CM | POA: Diagnosis not present

## 2020-08-12 DIAGNOSIS — Z0000 Encounter for general adult medical examination without abnormal findings: Secondary | ICD-10-CM | POA: Diagnosis not present

## 2020-08-12 DIAGNOSIS — E118 Type 2 diabetes mellitus with unspecified complications: Secondary | ICD-10-CM

## 2020-08-13 LAB — CBC
Hematocrit: 42.9 % (ref 34.0–46.6)
Hemoglobin: 13.9 g/dL (ref 11.1–15.9)
MCH: 27.7 pg (ref 26.6–33.0)
MCHC: 32.4 g/dL (ref 31.5–35.7)
MCV: 86 fL (ref 79–97)
Platelets: 194 10*3/uL (ref 150–450)
RBC: 5.01 x10E6/uL (ref 3.77–5.28)
RDW: 13.3 % (ref 11.7–15.4)
WBC: 10.6 10*3/uL (ref 3.4–10.8)

## 2020-08-13 LAB — COMPREHENSIVE METABOLIC PANEL
ALT: 14 IU/L (ref 0–32)
AST: 14 IU/L (ref 0–40)
Albumin/Globulin Ratio: 1.5 (ref 1.2–2.2)
Albumin: 4.3 g/dL (ref 3.8–4.8)
Alkaline Phosphatase: 91 IU/L (ref 48–121)
BUN/Creatinine Ratio: 21 (ref 9–23)
BUN: 15 mg/dL (ref 6–24)
Bilirubin Total: 0.2 mg/dL (ref 0.0–1.2)
CO2: 26 mmol/L (ref 20–29)
Calcium: 9.1 mg/dL (ref 8.7–10.2)
Chloride: 96 mmol/L (ref 96–106)
Creatinine, Ser: 0.72 mg/dL (ref 0.57–1.00)
GFR calc Af Amer: 120 mL/min/{1.73_m2} (ref >59)
GFR calc non Af Amer: 104 mL/min/{1.73_m2} (ref >59)
Globulin, Total: 2.9 g/dL (ref 1.5–4.5)
Glucose: 123 mg/dL — ABNORMAL HIGH (ref 65–99)
Potassium: 4.7 mmol/L (ref 3.5–5.2)
Sodium: 135 mmol/L (ref 134–144)
Total Protein: 7.2 g/dL (ref 6.0–8.5)

## 2020-08-13 LAB — LIPID PANEL
Chol/HDL Ratio: 2.8 ratio (ref 0.0–4.4)
Cholesterol, Total: 149 mg/dL (ref 100–199)
HDL: 54 mg/dL (ref >39)
LDL Chol Calc (NIH): 67 mg/dL (ref 0–99)
Triglycerides: 169 mg/dL — ABNORMAL HIGH (ref 0–149)
VLDL Cholesterol Cal: 28 mg/dL (ref 5–40)

## 2020-08-13 LAB — HEMOGLOBIN A1C
Est. average glucose Bld gHb Est-mCnc: 166 mg/dL
Hgb A1c MFr Bld: 7.4 % — ABNORMAL HIGH (ref 4.8–5.6)

## 2020-08-13 LAB — TSH: TSH: 1.5 u[IU]/mL (ref 0.450–4.500)

## 2020-08-18 ENCOUNTER — Encounter: Payer: PPO | Admitting: Physician Assistant

## 2020-08-24 ENCOUNTER — Encounter: Payer: Self-pay | Admitting: Medical-Surgical

## 2020-08-24 ENCOUNTER — Other Ambulatory Visit: Payer: Self-pay

## 2020-08-24 ENCOUNTER — Telehealth (INDEPENDENT_AMBULATORY_CARE_PROVIDER_SITE_OTHER): Payer: PPO | Admitting: Medical-Surgical

## 2020-08-24 VITALS — Ht 69.0 in | Wt 214.0 lb

## 2020-08-24 DIAGNOSIS — J069 Acute upper respiratory infection, unspecified: Secondary | ICD-10-CM | POA: Diagnosis not present

## 2020-08-24 MED ORDER — IPRATROPIUM BROMIDE 0.03 % NA SOLN: 2.0000 | Freq: Two times a day (BID) | NASAL | 0 refills | Status: DC

## 2020-08-24 MED ORDER — HYDROCODONE-HOMATROPINE 5-1.5 MG/5ML PO SYRP: 5.0000 mL | ORAL_SOLUTION | Freq: Three times a day (TID) | ORAL | 0 refills | Status: DC | PRN

## 2020-08-24 NOTE — Progress Notes (Signed)
Virtual Visit via Telephone   I connected with  Samantha [name]  on 08/24/20 by telephone/telehealth and verified that I am speaking with the correct person using two identifiers.   I discussed the limitations, risks, security and privacy concerns of performing an evaluation and management service by telephone, including the higher likelihood of inaccurate diagnosis and treatment, and the availability of in person appointments.  We also discussed the likely need of an additional face to face encounter for complete and high quality delivery of care.  I also discussed with the patient that there may be a patient responsible charge related to this service. The patient expressed understanding and wishes to proceed.  Provider location is in medical facility. Patient location is at their home, different from provider location. People involved in care of the patient during this telehealth encounter were myself, my nurse/medical assistant, and my front office/scheduling team member.  CC: cough, nasal congestion  HPI: Pleasant 39 year old female presenting via telephone visit with complaints of 2 days of severe nonproductive cough, hoarse voice, increased body aches, nasal congestion, rhinorrhea, PND, increased sweating, and dyspnea with severe coughing spells. States it feels like bronchitis. Feeling weaker than usual and believes her fibromyalgia is flaring up. Does not have a thermometer but doesn't think she has had a fever. Denies vomiting, changes in chronic GI issues, chest pain. Has been taking Robitussin cough syrup and OTC sinus medication. Has not had her COVID vaccine and reports she does not plan on getting it. Has not had a COVID test since the onset of her symptoms. Went to a birthday party on Saturday at a bounce house.    Review of Systems: See HPI for pertinent positives and negatives.   Objective Findings:    General: Speaking full sentences, no audible heavy breathing.  Sounds alert and  appropriately interactive.    Impression and Recommendations:    1. Upper respiratory tract infection, unspecified type Recommend COVID testing. Suspect etiology is a viral infection. Discussed supportive treatment using OTC medications. Sending in Hycodan cough syrup as she is not sleeping at night due to the cough. Also sending in Atrovent nasal spray to help reduce rhinorrhea and PND. Increase PO fluids. Rest when possible. If no improvement in s/s after 7-10 days or if she gets better followed by worse again, advised to reach out to [place] for further evaluation.   I discussed the above assessment and treatment plan with the patient. The patient was provided an opportunity to ask questions and all were answered. The patient agreed with the plan and demonstrated an understanding of the instructions.   The patient was advised to call back or seek an in-person evaluation if the symptoms worsen or if the condition fails to improve as anticipated.   20 minutes of non-face-to-face time was provided during this encounter.  Return if symptoms worsen or fail to improve. ___________________________________________ Samuel Bouche, DNP, APRN, FNP-BC [facility] [place]

## 2020-08-25 ENCOUNTER — Other Ambulatory Visit: Payer: Self-pay

## 2020-08-25 ENCOUNTER — Other Ambulatory Visit: Payer: PPO

## 2020-08-25 DIAGNOSIS — Z20822 Contact with and (suspected) exposure to covid-19: Secondary | ICD-10-CM

## 2020-08-26 ENCOUNTER — Telehealth: Payer: Self-pay | Admitting: Physician Assistant

## 2020-08-26 DIAGNOSIS — R059 Cough, unspecified: Secondary | ICD-10-CM

## 2020-08-26 DIAGNOSIS — R0989 Other specified symptoms and signs involving the circulatory and respiratory systems: Secondary | ICD-10-CM

## 2020-08-26 NOTE — Addendum Note (Signed)
Addended by: Mickel Crow on: 08/26/2020 11:17 AM   Modules accepted: Orders

## 2020-08-26 NOTE — Telephone Encounter (Signed)
Patient called in wanting to know if covid results had came back and she mentioned she thought might have phenomena and is requesting a callback from nurse to discuss and possibly a chest xray.

## 2020-08-26 NOTE — Telephone Encounter (Signed)
Ok for chest xray per [place]. Order has been placed. Patient aware to wait until covid lab resulted. AS, CMA

## 2020-08-27 ENCOUNTER — Telehealth: Payer: Self-pay | Admitting: Physician Assistant

## 2020-08-27 LAB — NOVEL CORONAVIRUS, NAA: SARS-CoV-2, NAA: DETECTED — AB

## 2020-08-27 LAB — SARS-COV-2, NAA 2 DAY TAT

## 2020-08-27 NOTE — Telephone Encounter (Signed)
Patient requested to be referred to covid antibody clinic. I have called clinic and left required information. Patient is aware turn around time is 24-48hrs.   Patient denies SOB or difficulty breathing. Patient is aware to monitor symptoms, take temp and I suggested them use a pulse ox to monitor oxygen level. Patient also encouraged to use tylenol/ibuprofen for body aches and temp. Patient advised to drink half of body weight in oz of water daily. Patient verbalized understanding. AS, CMA

## 2020-08-27 NOTE — Telephone Encounter (Signed)
Patient called requesting to speak with nurse about COVID antibody. She has some clinical questions that the front staff could not help address. Please contact when available.

## 2020-08-28 ENCOUNTER — Ambulatory Visit (HOSPITAL_COMMUNITY)
Admission: RE | Admit: 2020-08-28 | Discharge: 2020-08-28 | Disposition: A | Discharge status: Home / Self Care | Payer: Medicare Other | Source: Ambulatory Visit | Attending: Pulmonary Disease | Admitting: Pulmonary Disease

## 2020-08-28 ENCOUNTER — Other Ambulatory Visit: Payer: Self-pay | Admitting: Oncology

## 2020-08-28 ENCOUNTER — Encounter: Payer: Self-pay | Admitting: Oncology

## 2020-08-28 DIAGNOSIS — U071 COVID-19: Secondary | ICD-10-CM | POA: Diagnosis present

## 2020-08-28 DIAGNOSIS — Z23 Encounter for immunization: Secondary | ICD-10-CM | POA: Diagnosis not present

## 2020-08-28 MED ORDER — ONDANSETRON HCL 4 MG/2ML IJ SOLN
4.0000 mg | Freq: Once | INTRAMUSCULAR | Status: AC
  Administered 2020-08-28: 4 mg via INTRAVENOUS
  Filled 2020-08-28: qty 2

## 2020-08-28 MED ORDER — ACETAMINOPHEN 325 MG PO TABS
650.0000 mg | ORAL_TABLET | Freq: Four times a day (QID) | ORAL | Status: AC | PRN
  Administered 2020-08-28: 650 mg via ORAL
  Filled 2020-08-28: qty 2

## 2020-08-28 MED ORDER — METHYLPREDNISOLONE SODIUM SUCC 125 MG IJ SOLR: 125.0000 mg | Freq: Once | INTRAMUSCULAR | Status: DC | PRN

## 2020-08-28 MED ORDER — ALBUTEROL SULFATE HFA 108 (90 BASE) MCG/ACT IN AERS: 2.0000 | INHALATION_SPRAY | Freq: Once | RESPIRATORY_TRACT | Status: DC | PRN

## 2020-08-28 MED ORDER — DIPHENHYDRAMINE HCL 50 MG/ML IJ SOLN: 50.0000 mg | Freq: Once | INTRAMUSCULAR | Status: DC | PRN

## 2020-08-28 MED ORDER — EPINEPHRINE 0.3 MG/0.3ML IJ SOAJ: 0.3000 mg | Freq: Once | INTRAMUSCULAR | Status: DC | PRN

## 2020-08-28 MED ORDER — SODIUM CHLORIDE 0.9 % IV SOLN: INTRAVENOUS | Status: DC | PRN

## 2020-08-28 MED ORDER — SODIUM CHLORIDE 0.9 % IV SOLN
1200.0000 mg | Freq: Once | INTRAVENOUS | Status: AC
  Administered 2020-08-28: 1200 mg via INTRAVENOUS

## 2020-08-28 MED ORDER — FAMOTIDINE IN NACL 20-0.9 MG/50ML-% IV SOLN: 20.0000 mg | Freq: Once | INTRAVENOUS | Status: DC | PRN

## 2020-08-28 NOTE — Progress Notes (Signed)
  Diagnosis: COVID-19  Physician:DR. Joya Gaskins  Procedure: Covid Infusion Clinic Med: casirivimab\imdevimab infusion - Provided patient with casirivimab\imdevimab fact sheet for patients, parents and caregivers prior to infusion.  Complications: No immediate complications noted.  Discharge: Discharged home   Samantha [name] 08/28/2020 ,

## 2020-08-28 NOTE — Discharge Instructions (Signed)

## 2020-08-28 NOTE — Progress Notes (Signed)
I connected by phone with  Samantha [name] to discuss the potential use of an new treatment for mild to moderate COVID-19 viral infection in non-hospitalized patients.   This patient is a age/sex that meets the FDA criteria for Emergency Use Authorization of casirivimab\imdevimab.  Has a (+) direct SARS-CoV-2 viral test result 1. Has mild or moderate COVID-19  2. Is ? 41 years of age and weighs ? 40 kg 3. Is NOT hospitalized due to COVID-19 4. Is NOT requiring oxygen therapy or requiring an increase in baseline oxygen flow rate due to COVID-19 5. Is within 10 days of symptom onset 6. Has at least one of the high risk factor(s) for progression to severe COVID-19 and/or hospitalization as defined in EUA. Specific high risk criteria : Past Medical History:  Diagnosis Date  . Anemia   . Angio-edema   . Anxiety   . Complication of anesthesia    slow to wake up  . Depression    rx presc but not taken  . Diabetes mellitus without complication ([place])   . Dilated aortic root ([place])    a. 05/2020 Echo: Ao root 33mm.  . Fibromyalgia   . GERD (gastroesophageal reflux disease)    no meds  . H/o Sinus infection   . Headache(784.0)    tension and with anxiety hx  . Hepatic steatosis   . History of kidney stones   . MVP (mitral valve prolapse)    a. Pt told MVP present as child; b. 05/2020 Echo: EF 55-60%, no rwma, nl RV size/fxn, triv MR, mild AI. Ao root 39mm.  . Pneumonia    hx  . Seizures ([place])    a. non epileptic events per epilepsy monitoring unit; b. last seizure July 2015- does not convulse but has a sleep effect  . Sleep apnea    has cpap does not use  ?  ?    Symptom onset 08/24/20   I have spoken and communicated the following to the patient or parent/caregiver:   1. FDA has authorized the emergency use of casirivimab\imdevimab for the treatment of mild to moderate COVID-19 in adults and pediatric patients with positive results of direct SARS-CoV-2 viral testing who are 49 years of age  and older weighing at least 40 kg, and who are at high risk for progressing to severe COVID-19 and/or hospitalization.   2. The significant known and potential risks and benefits of casirivimab\imdevimab, and the extent to which such potential risks and benefits are unknown.   3. Information on available alternative treatments and the risks and benefits of those alternatives, including clinical trials.   4. Patients treated with casirivimab\imdevimab should continue to self-isolate and use infection control measures (e.g., wear mask, isolate, social distance, avoid sharing personal items, clean and disinfect "high touch" surfaces, and frequent handwashing) according to CDC guidelines.    5. The patient or parent/caregiver has the option to accept or refuse casirivimab\imdevimab .   After reviewing this information with the patient, The patient agreed to proceed with receiving casirivimab\imdevimab infusion and will be provided a copy of the Fact sheet prior to receiving the infusion.Rulon Abide, AGNP-C [phone] ([place])

## 2020-09-03 ENCOUNTER — Ambulatory Visit: Payer: PPO | Admitting: Nurse Practitioner

## 2020-09-09 ENCOUNTER — Emergency Department
Admission: EM | Admit: 2020-09-09 | Discharge: 2020-09-09 | Disposition: A | Discharge status: Home / Self Care | Payer: PPO | Attending: Emergency Medicine | Admitting: Emergency Medicine

## 2020-09-09 ENCOUNTER — Other Ambulatory Visit: Payer: Self-pay

## 2020-09-09 ENCOUNTER — Emergency Department: Payer: PPO

## 2020-09-09 ENCOUNTER — Ambulatory Visit: Payer: Medicare Other | Admitting: Physician Assistant

## 2020-09-09 DIAGNOSIS — I10 Essential (primary) hypertension: Secondary | ICD-10-CM | POA: Insufficient documentation

## 2020-09-09 DIAGNOSIS — R1110 Vomiting, unspecified: Secondary | ICD-10-CM | POA: Diagnosis not present

## 2020-09-09 DIAGNOSIS — Z794 Long term (current) use of insulin: Secondary | ICD-10-CM | POA: Diagnosis not present

## 2020-09-09 DIAGNOSIS — N200 Calculus of kidney: Secondary | ICD-10-CM | POA: Diagnosis not present

## 2020-09-09 DIAGNOSIS — Z79899 Other long term (current) drug therapy: Secondary | ICD-10-CM | POA: Diagnosis not present

## 2020-09-09 DIAGNOSIS — K760 Fatty (change of) liver, not elsewhere classified: Secondary | ICD-10-CM | POA: Diagnosis not present

## 2020-09-09 DIAGNOSIS — E119 Type 2 diabetes mellitus without complications: Secondary | ICD-10-CM | POA: Diagnosis not present

## 2020-09-09 DIAGNOSIS — R112 Nausea with vomiting, unspecified: Secondary | ICD-10-CM | POA: Diagnosis not present

## 2020-09-09 DIAGNOSIS — R109 Unspecified abdominal pain: Secondary | ICD-10-CM | POA: Diagnosis not present

## 2020-09-09 LAB — URINALYSIS, COMPLETE (UACMP) WITH MICROSCOPIC
Bilirubin Urine: NEGATIVE
Glucose, UA: NEGATIVE mg/dL
Ketones, ur: NEGATIVE mg/dL
Nitrite: NEGATIVE
Protein, ur: 30 mg/dL — AB
RBC / HPF: >50 RBC/hpf — ABNORMAL HIGH (ref 0–5)
Specific Gravity, Urine: 1.018 (ref 1.005–1.030)
Squamous Epithelial / HPF: >50 — ABNORMAL HIGH (ref 0–5)
pH: 5 (ref 5.0–8.0)

## 2020-09-09 LAB — CBC
HCT: 37.5 % (ref 36.0–46.0)
Hemoglobin: 12.2 g/dL (ref 12.0–15.0)
MCH: 27.4 pg (ref 26.0–34.0)
MCHC: 32.5 g/dL (ref 30.0–36.0)
MCV: 84.3 fL (ref 80.0–100.0)
Platelets: 254 10*3/uL (ref 150–400)
RBC: 4.45 MIL/uL (ref 3.87–5.11)
RDW: 12.5 % (ref 11.5–15.5)
WBC: 5.5 10*3/uL (ref 4.0–10.5)
nRBC: 0 % (ref 0.0–0.2)

## 2020-09-09 LAB — COMPREHENSIVE METABOLIC PANEL
ALT: 39 U/L (ref 0–44)
AST: 30 U/L (ref 15–41)
Albumin: 3.4 g/dL — ABNORMAL LOW (ref 3.5–5.0)
Alkaline Phosphatase: 52 U/L (ref 38–126)
Anion gap: 11 (ref 5–15)
BUN: 10 mg/dL (ref 6–20)
CO2: 26 mmol/L (ref 22–32)
Calcium: 8.7 mg/dL — ABNORMAL LOW (ref 8.9–10.3)
Chloride: 100 mmol/L (ref 98–111)
Creatinine, Ser: 0.7 mg/dL (ref 0.44–1.00)
GFR calc non Af Amer: >60 mL/min (ref >60)
Glucose, Bld: 203 mg/dL — ABNORMAL HIGH (ref 70–99)
Potassium: 3.7 mmol/L (ref 3.5–5.1)
Sodium: 137 mmol/L (ref 135–145)
Total Bilirubin: 0.6 mg/dL (ref 0.3–1.2)
Total Protein: 7.2 g/dL (ref 6.5–8.1)

## 2020-09-09 LAB — LIPASE, BLOOD: Lipase: 24 U/L (ref 11–51)

## 2020-09-09 LAB — PREGNANCY, URINE: Preg Test, Ur: NEGATIVE

## 2020-09-09 MED ORDER — KETOROLAC TROMETHAMINE 30 MG/ML IJ SOLN
30.0000 mg | Freq: Four times a day (QID) | INTRAMUSCULAR | Status: DC | PRN
  Administered 2020-09-09: 30 mg via INTRAVENOUS
  Filled 2020-09-09: qty 1

## 2020-09-09 MED ORDER — CEFDINIR 300 MG PO CAPS: 300.0000 mg | ORAL_CAPSULE | Freq: Two times a day (BID) | ORAL | 0 refills | Status: AC

## 2020-09-09 MED ORDER — LACTATED RINGERS IV BOLUS
1000.0000 mL | Freq: Once | INTRAVENOUS | Status: AC
  Administered 2020-09-09: 1000 mL via INTRAVENOUS

## 2020-09-09 MED ORDER — HYDROCODONE-ACETAMINOPHEN 5-325 MG PO TABS
2.0000 | ORAL_TABLET | Freq: Four times a day (QID) | ORAL | 0 refills | Status: DC | PRN
Start: 2020-09-09 — End: 2021-07-16

## 2020-09-09 MED ORDER — ONDANSETRON 4 MG PO TBDP: 4.0000 mg | ORAL_TABLET | Freq: Once | ORAL | Status: AC | PRN

## 2020-09-09 MED ORDER — ONDANSETRON 4 MG PO TBDP
ORAL_TABLET | ORAL | Status: AC
  Administered 2020-09-09: 4 mg via ORAL
  Filled 2020-09-09: qty 1

## 2020-09-09 MED ORDER — TAMSULOSIN HCL 0.4 MG PO CAPS: 0.4000 mg | ORAL_CAPSULE | Freq: Every day | ORAL | 0 refills | Status: AC

## 2020-09-09 MED ORDER — IOHEXOL 300 MG/ML  SOLN
100.0000 mL | Freq: Once | INTRAMUSCULAR | Status: AC | PRN
  Administered 2020-09-09: 100 mL via INTRAVENOUS

## 2020-09-09 NOTE — ED Triage Notes (Signed)
Pt comes via POV from home with c/o right sided pain. Pt also states vomiting and nausea.  Pt states this started this am. Pt states pain with urination.

## 2020-09-09 NOTE — ED Notes (Signed)
Pt ambulated out of ED with steady gait, no distress noted

## 2020-09-09 NOTE — ED Triage Notes (Signed)
First RN note: pt states left side pain, flank pain with nausea and vomting this am.

## 2020-09-09 NOTE — ED Provider Notes (Signed)
[facility] Emergency Department Provider Note   ____________________________________________   First MD Initiated Contact with Patient 09/09/20 0940     (approximate)  I have reviewed the triage vital signs and the nursing notes.   HISTORY  Chief Complaint Abdominal Pain    HPI Samantha [name] is a 39 y.o. female with a stated past medical history of recurrent kidney stones who presents for right flank pain with associated nausea/vomiting for the last 24 hours.  Patient's describes 10/10 right flank pain that radiates around into her right groin and has no exacerbating or relieving factors.  Patient states that this pain worsens and abates periodically.  Patient states that she is also having some pain with urination.  Patient describes any burning with urination but does feel worsening abdominal pain when she has to use the bathroom.  Patient states this is similar to previous kidney stone pain that she has had in the past.         Past Medical History:  Diagnosis Date  . Anemia   . Angio-edema   . Anxiety   . Complication of anesthesia    slow to wake up  . Depression    rx presc but not taken  . Diabetes mellitus without complication ([place])   . Dilated aortic root ([place])    a. 05/2020 Echo: Ao root 70mm.  . Fibromyalgia   . GERD (gastroesophageal reflux disease)    no meds  . H/o Sinus infection   . Headache(784.0)    tension and with anxiety hx  . Hepatic steatosis   . History of kidney stones   . MVP (mitral valve prolapse)    a. Pt told MVP present as child; b. 05/2020 Echo: EF 55-60%, no rwma, nl RV size/fxn, triv MR, mild AI. Ao root 45mm.  . Pneumonia    hx  . Seizures ([place])    a. non epileptic events per epilepsy monitoring unit; b. last seizure July 2015- does not convulse but has a sleep effect  . Sleep apnea    has cpap does not use    Patient Active Problem List   Diagnosis Date Noted  . Chest pain 04/08/2020  . SOB  (shortness of breath) 04/08/2020  . Palpitations 04/08/2020  . Essential hypertension 06/12/2019  . Mood disorder ([place]) 06/12/2019  . Ureteral calculus 05/20/2019  . Renal colic 85/46/2703  . Hydronephrosis with urinary obstruction due to ureteral calculus 05/20/2019  . Irritability and anger 03/06/2019  . Heartburn 03/04/2019  . Chronic rhinitis 03/04/2019  . Adverse food reaction 03/04/2019  . Globus sensation 02/28/2019  . Finger pain, left 01/01/2019  . Allergy history unknown- possible throat closure sensation to certain foods 50/08/3817  . Neuropathic pain 09/04/2018  . Chronic radicular pain of lower back 09/04/2018  . Lumbar foraminal stenosis-L5-S1 09/04/2018  . Osteoarthritis of spine with radiculopathy, lumbar region 09/04/2018  . Obesity, Class I, BMI 30-34.9 09/04/2018  . Piriformis syndrome, left 07/12/2018  . Single episode of elevated blood pressure 07/12/2018  . Hypertriglyceridemia 04/11/2018  . Low level of high density lipoprotein (HDL) 04/11/2018  . Vitamin D insufficiency 04/11/2018  . Epigastric abdominal pain with N 04/06/2018  . History of vitamin D deficiency 04/06/2018  . Spondylosis without myelopathy or radiculopathy, lumbar region 10/03/2017  . Acute folliculitis- likely due to MRSA, they are draining 06/19/2017  . Personal history of MRSA (methicillin resistant Staphylococcus aureus) 06/19/2017  . Chronic diarrhea  06/19/2017  . Contact dermatitis and eczema-  due to tape\ Band-Aid 06/19/2017  . Abdominal pain, LUQ 05/02/2017  . Abdominal pain, LLQ 05/02/2017  . Hx of diarrhea 05/02/2017  . Environmental and seasonal allergies 09/29/2016  . GERD (gastroesophageal reflux disease) 09/29/2016  . Bipolar 1 disorder - Anxiety 09/28/2016  . Fibromyalgia 08/11/2016  . Type 2 diabetes mellitus with complication, without long-term current use of insulin ([place]) 12/29/2015  . OSA on CPAP 12/08/2014  . Obstructive sleep apnea syndrome, moderate 11/24/2014  .  Fatty liver 09/05/2014  . S/P abdominal hysterectomy 08/15/2014  . Bladder dysfunction 04/30/2014  . Nephrolithiasis 03/04/2014  . Agenesis, corpus callosum ([place]) 02/17/2014  . GAD (generalized anxiety disorder) 02/17/2014  . History of blood transfusion 02/17/2014  . Obesity (BMI 30-39.9) 02/17/2014    Past Surgical History:  Procedure Laterality Date  . ABDOMINAL HYSTERECTOMY N/A 08/15/2014   Procedure: HYSTERECTOMY ABDOMINAL WITH CYSTOSCOPY;  Surgeon: Sanjuana Kava, MD;  Location: [facility];  Service: Gynecology;  Laterality: N/A;  . CESAREAN SECTION N/A   . CYSTOSCOPY/RETROGRADE/URETEROSCOPY Left 05/23/2019   Procedure: CYSTOSCOPY/RETROGRADE/URETEROSCOPY;  Surgeon: Abbie Sons, MD;  Location: [facility];  Service: Urology;  Laterality: Left;  . KNEE ARTHROSCOPY    . SHOULDER ARTHROSCOPY WITH SUBACROMIAL DECOMPRESSION AND OPEN ROTATOR C Left 11/27/2015   Procedure: LEFT SHOULDER ARTHROSCOPY WITH SUBACROMIAL DECOMPRESSION, MINI OPEN ROTATOR CUFF REPAIR;  Surgeon: Netta Cedars, MD;  Location: [facility];  Service: Orthopedics;  Laterality: Left;  . TUBAL LIGATION      Prior to Admission medications   Medication Sig Start Date End Date Taking? Authorizing Provider  amitriptyline (ELAVIL) 10 MG tablet Take 50 mg by mouth at bedtime.  07/19/19   [provider]  blood glucose meter kit and supplies Dispense based on patient and insurance preference. Use to check blood glucose levels fasting in the morning and 2 hours after largest meal daily.  (FOR ICD-10 E10.9, E11.9). 04/06/18   Opalski, Neoma Laming, DO  cefdinir (OMNICEF) 300 MG capsule Take 1 capsule (300 mg total) by mouth 2 (two) times daily for 5 days. 09/09/20 09/14/20  Naaman Plummer, MD  FLUoxetine (PROZAC) 20 MG tablet Take 1 tablet (20 mg total) by mouth daily. 04/16/20   Lorrene Reid, PA-C  HYDROcodone-acetaminophen (NORCO) 5-325 MG tablet Take 2 tablets by mouth every 6 (six) hours as needed for moderate pain. 09/09/20   Naaman Plummer, MD  HYDROcodone-homatropine (HYCODAN) 5-1.5 MG/5ML syrup Take 5 mLs by mouth every 8 (eight) hours as needed for cough. 08/24/20   Samuel Bouche, NP  hyoscyamine (LEVSIN SL) 0.125 MG SL tablet 1 tablet sublingual every 6-8 hours as needed for abdominal pain. Patient not taking: Reported on 08/24/2020 02/18/20   Noralyn Pick, NP  ipratropium (ATROVENT) 0.03 % nasal spray Place 2 sprays into both nostrils every 12 (twelve) hours. 08/24/20   Samuel Bouche, NP  liraglutide (VICTOZA) 18 MG/3ML SOPN 1.$RemoveBefor'2mg'EbJqpTmsGwBk$  [place] qd Patient not taking: Reported on 08/24/2020 04/17/19   [name], DO  metoprolol succinate (TOPROL XL) 25 MG 24 hr tablet Take 0.5 tablets (12.5 mg total) by mouth daily. 04/08/20   End, Harrell Gave, MD  ondansetron (ZOFRAN) 4 MG tablet Take 1 tablet (4 mg total) by mouth every 8 (eight) hours as needed for nausea or vomiting. 02/18/20   Noralyn Pick, NP  tamsulosin (FLOMAX) 0.4 MG CAPS capsule Take 1 capsule (0.4 mg total) by mouth daily for 30 doses. 09/09/20 10/09/20  Naaman Plummer, MD  triamcinolone cream (KENALOG) 0.1 % Apply 1 application topically  2 (two) times daily. 06/03/20   Lorrene Reid, PA-C  valACYclovir (VALTREX) 500 MG tablet Take 500 mg by mouth daily as needed (cold sores).     [provider]  Vitamin D, Ergocalciferol, (DRISDOL) 1.25 MG (50000 UT) CAPS capsule Take one tablet wkly 10/15/19   Opalski, Deborah, DO    Allergies Sulfa antibiotics, Topiramate, Duloxetine hcl, Ibuprofen, Metformin and related, and Penicillins  Family History  Problem Relation Age of Onset  . Diabetes Mother   . Hypertension Mother   . Healthy Father   . Breast cancer Maternal Grandmother   . Cancer Maternal Grandfather        brain, lung, liver  . Heart attack Paternal Grandfather     Social History Social History   Tobacco Use  . Smoking status: Never Smoker  . Smokeless tobacco: Never Used  Vaping Use  . Vaping Use: Never used  Substance Use  Topics  . Alcohol use: No  . Drug use: No    Review of Systems Constitutional: No fever/chills Eyes: No visual changes. ENT: No sore throat. Cardiovascular: Denies chest pain. Respiratory: Denies shortness of breath. Gastrointestinal: Endorses abdominal pain.  Endorses nausea, no vomiting.  No diarrhea. Genitourinary: Negative for dysuria. Musculoskeletal: Negative for acute arthralgias Skin: Negative for rash. Neurological: Negative for headaches, weakness/numbness/paresthesias in any extremity Psychiatric: Negative for suicidal ideation/homicidal ideation   ____________________________________________   PHYSICAL EXAM:  VITAL SIGNS: ED Triage Vitals  Enc Vitals Group     BP 09/09/20 0819 125/84     Pulse Rate 09/09/20 0819 75     Resp 09/09/20 0819 18     Temp 09/09/20 0819 97.9 F (36.6 C)     Temp Source 09/09/20 0819 Oral     SpO2 09/09/20 0819 100 %     Weight 09/09/20 0818 205 lb (93 kg)     Height 09/09/20 0818 $RemoveBefor'5\' 9"'sPJQihwozHCC$  (1.753 m)     Head Circumference --      Peak Flow --      Pain Score 09/09/20 0832 10     Pain Loc --      Pain Edu? --      Excl. in [place]? --    Constitutional: Alert and oriented. Well appearing and in no acute distress. Eyes: Conjunctivae are normal. PERRL. Head: Atraumatic. Nose: No congestion/rhinnorhea. Mouth/Throat: Mucous membranes are moist. Neck: No stridor Cardiovascular: Grossly normal heart sounds.  Good peripheral circulation. Respiratory: Normal respiratory effort.  No retractions. Gastrointestinal: Right lower quadrant tenderness palpation, right CVA tenderness to percussion. No distention. Musculoskeletal: No obvious deformities Neurologic:  Normal speech and language. No gross focal neurologic deficits are appreciated. Skin:  Skin is warm and dry. No rash noted. Psychiatric: Mood and affect are normal. Speech and behavior are normal.  ____________________________________________   LABS (all labs ordered are listed, but  only abnormal results are displayed)  Labs Reviewed  COMPREHENSIVE METABOLIC PANEL - Abnormal; Notable for the following components:      Result Value   Glucose, Bld 203 (*)    Calcium 8.7 (*)    Albumin 3.4 (*)    All other components within normal limits  URINALYSIS, COMPLETE (UACMP) WITH MICROSCOPIC - Abnormal; Notable for the following components:   Color, Urine YELLOW (*)    APPearance TURBID (*)    Hgb urine dipstick LARGE (*)    Protein, ur 30 (*)    Leukocytes,Ua LARGE (*)    RBC / HPF >50 (*)    Bacteria, UA FEW (*)  Squamous Epithelial / LPF >50 (*)    All other components within normal limits  LIPASE, BLOOD  CBC  PREGNANCY, URINE   RADIOLOGY  ED MD interpretation: CT of the abdomen and pelvis with contrast show a 4 mm right UVJ calculus causing mild hydroureteronephrosis as well as patchy opacities in bilateral lower lung fields.  Official radiology report(s): CT Abdomen Pelvis W Contrast  Result Date: 09/09/2020 CLINICAL DATA:  Right-sided abdominal pain with nausea and vomiting EXAM: CT ABDOMEN AND PELVIS WITH CONTRAST TECHNIQUE: Multidetector CT imaging of the abdomen and pelvis was performed using the standard protocol following bolus administration of intravenous contrast. CONTRAST:  174mL OMNIPAQUE IOHEXOL 300 MG/ML  SOLN COMPARISON:  03/25/2020 FINDINGS: Lower chest: Patchy reticulated airspace densities in the lower lungs correlating with recent COVID infection. Hepatobiliary: Hepatic steatosis.No evidence of biliary obstruction or stone. Pancreas: Unremarkable. Spleen: Unremarkable. Adrenals/Urinary Tract: Negative adrenals. 4 mm distal right ureteral calculus with mild hydroureteronephrosis. Unremarkable bladder. Stomach/Bowel:  No obstruction. No evidence of bowel inflammation. Vascular/Lymphatic: No acute vascular abnormality. No mass or adenopathy. Reproductive:Hysterectomy.  Symmetric ovarian appearance Other: No ascites or pneumoperitoneum. Musculoskeletal:  No acute or aggressive finding. IMPRESSION: 1. 4 mm right UVJ calculus causing mild hydroureteronephrosis. 2. Patchy pneumonia at the lung bases correlating with history of recent COVID. 3. Hepatic steatosis. Electronically Signed   By: Monte Fantasia M.D.   On: 09/09/2020 11:11    ____________________________________________   PROCEDURES  Procedure(s) performed (including Critical Care):  .1-3 Lead EKG Interpretation Performed by: Naaman Plummer, MD Authorized by: Naaman Plummer, MD     Interpretation: normal     ECG rate:  83   ECG rate assessment: normal     Rhythm: sinus rhythm     Ectopy: none     Conduction: normal       ____________________________________________   INITIAL IMPRESSION / ASSESSMENT AND PLAN / ED COURSE  As part of my medical decision making, I reviewed the following data within the [place] notes reviewed and incorporated, Labs reviewed, EKG interpreted, Old chart reviewed, Radiograph reviewed and Notes from prior ED visits reviewed and incorporated        Patient presents for severe flank pain. Presentation most consistent with Renal Colic from a Non-infected Kidney Stone. Given History and Exam I have lower suspicion for atypical appendicitis, genital torsion, acute cholecystitis, AAA, Aortic Dissection, Serious Bacterial Illness or other emergent intraabdominal pathology.  Workup: CBC, BMP, CT Abd/Pelvis noncontrast, UA, reassess Findings: 4 mm right UVJ calculus Reassesment: Patient tolerating PO and pain controlled Disposition:  Discharge. Strict return precautions for infected stone or PO intolerance discussed.      ____________________________________________   FINAL CLINICAL IMPRESSION(S) / ED DIAGNOSES  Final diagnoses:  Right kidney stone     ED Discharge Orders         Ordered    tamsulosin (FLOMAX) 0.4 MG CAPS capsule  Daily        09/09/20 1132    HYDROcodone-acetaminophen (NORCO) 5-325 MG  tablet  Every 6 hours PRN        09/09/20 1132    cefdinir (OMNICEF) 300 MG capsule  2 times daily        09/09/20 1132           Note:  This document was prepared using Dragon voice recognition software and may include unintentional dictation errors.   Naaman Plummer, MD 09/09/20 [phone]

## 2020-09-15 ENCOUNTER — Ambulatory Visit: Payer: Medicare Other | Admitting: Physician Assistant

## 2020-09-23 ENCOUNTER — Encounter: Payer: Self-pay | Admitting: Physician Assistant

## 2020-09-23 ENCOUNTER — Ambulatory Visit (INDEPENDENT_AMBULATORY_CARE_PROVIDER_SITE_OTHER): Payer: PPO | Admitting: Physician Assistant

## 2020-09-23 ENCOUNTER — Other Ambulatory Visit: Payer: Self-pay

## 2020-09-23 VITALS — BP 118/75 | HR 98 | Temp 98.4°F | Ht 68.25 in | Wt 207.6 lb

## 2020-09-23 DIAGNOSIS — Z1231 Encounter for screening mammogram for malignant neoplasm of breast: Secondary | ICD-10-CM | POA: Diagnosis not present

## 2020-09-23 DIAGNOSIS — Z0000 Encounter for general adult medical examination without abnormal findings: Secondary | ICD-10-CM | POA: Diagnosis not present

## 2020-09-23 DIAGNOSIS — E781 Pure hyperglyceridemia: Secondary | ICD-10-CM

## 2020-09-23 DIAGNOSIS — E1159 Type 2 diabetes mellitus with other circulatory complications: Secondary | ICD-10-CM | POA: Diagnosis not present

## 2020-09-23 DIAGNOSIS — Z23 Encounter for immunization: Secondary | ICD-10-CM | POA: Diagnosis not present

## 2020-09-23 DIAGNOSIS — I152 Hypertension secondary to endocrine disorders: Secondary | ICD-10-CM

## 2020-09-23 DIAGNOSIS — E118 Type 2 diabetes mellitus with unspecified complications: Secondary | ICD-10-CM | POA: Diagnosis not present

## 2020-09-23 MED ORDER — VICTOZA 18 MG/3ML ~~LOC~~ SOPN: PEN_INJECTOR | SUBCUTANEOUS | 2 refills | Status: DC

## 2020-09-23 NOTE — Patient Instructions (Signed)
Preventive Care 40-41 Years Old, Female °Preventive care refers to visits with your health care provider and lifestyle choices that can promote health and wellness. This includes: °· A yearly physical exam. This may also be called an annual well check. °· Regular dental visits and eye exams. °· Immunizations. °· Screening for certain conditions. °· Healthy lifestyle choices, such as eating a healthy diet, getting regular exercise, not using drugs or products that contain nicotine and tobacco, and limiting alcohol use. °What can I expect for my preventive care visit? °Physical exam °Your health care provider will check your: °· Height and weight. This may be used to calculate body mass index (BMI), which tells if you are at a healthy weight. °· Heart rate and blood pressure. °· Skin for abnormal spots. °Counseling °Your health care provider may ask you questions about your: °· Alcohol, tobacco, and drug use. °· Emotional well-being. °· Home and relationship well-being. °· Sexual activity. °· Eating habits. °· Work and work environment. °· Method of birth control. °· Menstrual cycle. °· Pregnancy history. °What immunizations do I need? ° °Influenza (flu) vaccine °· This is recommended every year. °Tetanus, diphtheria, and pertussis (Tdap) vaccine °· You may need a Td booster every 10 years. °Varicella (chickenpox) vaccine °· You may need this if you have not been vaccinated. °Zoster (shingles) vaccine °· You may need this after age 60. °Measles, mumps, and rubella (MMR) vaccine °· You may need at least one dose of MMR if you were born in 1957 or later. You may also need a second dose. °Pneumococcal conjugate (PCV13) vaccine °· You may need this if you have certain conditions and were not previously vaccinated. °Pneumococcal polysaccharide (PPSV23) vaccine °· You may need one or two doses if you smoke cigarettes or if you have certain conditions. °Meningococcal conjugate (MenACWY) vaccine °· You may need this if you  have certain conditions. °Hepatitis A vaccine °· You may need this if you have certain conditions or if you travel or work in places where you may be exposed to hepatitis A. °Hepatitis B vaccine °· You may need this if you have certain conditions or if you travel or work in places where you may be exposed to hepatitis B. °Haemophilus influenzae type b (Hib) vaccine °· You may need this if you have certain conditions. °Human papillomavirus (HPV) vaccine °· If recommended by your health care provider, you may need three doses over 6 months. °You may receive vaccines as individual doses or as more than one vaccine together in one shot (combination vaccines). Talk with your health care provider about the risks and benefits of combination vaccines. °What tests do I need? °Blood tests °· Lipid and cholesterol levels. These may be checked every 5 years, or more frequently if you are over 50 years old. °· Hepatitis C test. °· Hepatitis B test. °Screening °· Lung cancer screening. You may have this screening every year starting at age 55 if you have a 30-pack-year history of smoking and currently smoke or have quit within the past 15 years. °· Colorectal cancer screening. All adults should have this screening starting at age 50 and continuing until age 75. Your health care provider may recommend screening at age 45 if you are at increased risk. You will have tests every 1-10 years, depending on your results and the type of screening test. °· Diabetes screening. This is done by checking your blood sugar (glucose) after you have not eaten for a while (fasting). You may have this   done every 1-3 years.  Mammogram. This may be done every 1-2 years. Talk with your health care provider about when you should start having regular mammograms. This may depend on whether you have a family history of breast cancer.  BRCA-related cancer screening. This may be done if you have a family history of breast, ovarian, tubal, or peritoneal  cancers.  Pelvic exam and Pap test. This may be done every 3 years starting at age 76. Starting at age 89, this may be done every 5 years if you have a Pap test in combination with an HPV test. Other tests  Sexually transmitted disease (STD) testing.  Bone density scan. This is done to screen for osteoporosis. You may have this scan if you are at high risk for osteoporosis. Follow these instructions at home: Eating and drinking  Eat a diet that includes fresh fruits and vegetables, whole grains, lean protein, and low-fat dairy.  Take vitamin and mineral supplements as recommended by your health care provider.  Do not drink alcohol if: ? Your health care provider tells you not to drink. ? You are pregnant, may be pregnant, or are planning to become pregnant.  If you drink alcohol: ? Limit how much you have to 0-1 drink a day. ? Be aware of how much alcohol is in your drink. In the [place] one drink equals one 12 oz bottle of beer (355 mL), one 5 oz glass of wine (148 mL), or one 1 oz glass of hard liquor (44 mL). Lifestyle  Take daily care of your teeth and gums.  Stay active. Exercise for at least 30 minutes on 5 or more days each week.  Do not use any products that contain nicotine or tobacco, such as cigarettes, e-cigarettes, and chewing tobacco. If you need help quitting, ask your health care provider.  If you are sexually active, practice safe sex. Use a condom or other form of birth control (contraception) in order to prevent pregnancy and STIs (sexually transmitted infections).  If told by your health care provider, take low-dose aspirin daily starting at age 37. What's next?  Visit your health care provider once a year for a well check visit.  Ask your health care provider how often you should have your eyes and teeth checked.  Stay up to date on all vaccines. This information is not intended to replace advice given to you by your health care provider. Make sure you  discuss any questions you have with your health care provider. Document Revised: 08/02/2018 Document Reviewed: 08/02/2018 Elsevier Patient Education  2020 [employer].

## 2020-09-23 NOTE — Progress Notes (Signed)
Female Physical   Impression and Recommendations:    1. Healthcare maintenance   2. Encounter for screening mammogram for malignant neoplasm of breast   3. Need for influenza vaccination   4. Type 2 diabetes mellitus with complication, without long-term current use of insulin ([place])   5. Hypertension associated with diabetes ([place])   6. Hypertriglyceridemia      1) Anticipatory Guidance: Discussed skin CA prevention and sunscreen when outside along with skin surveillance; eating a balanced and modest diet; physical activity at least 25 minutes per day or minimum of 150 min/ week moderate to intense activity.  2) Immunizations / Screenings / Labs:   All immunizations are up-to-date per recommendations or will be updated today if pt allows.    - Patient understands with dental and vision screens they will schedule independently.  - Obtained CBC, CMP, HgA1c, Lipid panel, and TSH when fasting.  Most labs are essentially within normal limits or stable from prior.  A1c increased from 6.9 to 7.4 which correlates with elevated fasting glucose, and triglycerides are mildly elevated. - UTD on colonoscopy and Tdap. - Placed order for screening mammogram and agreeable to influenza vaccine today.  3) Weight:  Discussed goal to improve diet habits to improve overall feelings of well being and objective health data. Improve nutrient density of diet through increasing intake of fruits and vegetables and decreasing saturated fats, white flour products and refined sugars.  4) Healthcare Maintenance: -Follow a heart healthy diet and monitor carbohydrates and glucose. Resume physical activity. -A1c increased from prior and patient has not been taking Victoza so will restart medication and gradually titrate.  -Stay well hydrated. -BP stable stable today. -Follow up in 4 months for regular OV: DM, Mood   Meds ordered this encounter  Medications  . liraglutide (VICTOZA) 18 MG/3ML SOPN    Sig: Start  0.$Remove'6mg'pmAGVTe$  SQ once a day for 7 days, then increase to 1.$RemoveBefor'2mg'[id number]$  once a day    Dispense:  6 mL    Refill:  2    Order Specific Question:   Supervising Provider    Answer:   Beatrice Lecher D [[id number]]    Orders Placed This Encounter  Procedures  . MM Digital Screening  . Flu Vaccine QUAD 36+ mos IM     Return in about 4 months (around 01/24/2021) for DM, Mood.      Gross side effects, risk and benefits, and alternatives of medications discussed with patient.  Patient is aware that all medications have potential side effects and we are unable to predict every side effect or drug-drug interaction that may occur.  Expresses verbal understanding and consents to current therapy plan and treatment regimen.  F-up preventative CPE in 1 year- reminded pt again, this is in addition to any chronic care visits.    Please see orders placed and AVS handed out to patient at the end of our visit for further patient instructions/ counseling done pertaining to today's office visit.  Note:  This note was prepared with assistance of Dragon voice recognition software. Occasional wrong-word or sound-a-like substitutions may have occurred due to the inherent limitations of voice recognition software.    Subjective:     CPE HPI: Samantha [name] is a 39 y.o. female who presents to [place] [facility] today for a yearly health maintenance exam.   Health Maintenance Summary  - Reviewed and updated, unless pt declines services.  Last Cologuard or Colonoscopy:   10/12/2015-normal Tobacco History Reviewed:  Y, never a smoker Alcohol and/or drug use:    No concerns; no use Dental Home: No Eye exams: Yes Female Health:  PAP Smear - last known results:s/p hysterectomy STD concerns:   none Lumps or breast concerns:  none Breast Cancer Family History:  No     Immunization History  Administered Date(s) Administered  . Influenza Split 06/05/2011  . Influenza, Seasonal, Injecte,  Preservative Fre 09/14/2015  . Influenza,inj,Quad PF,6+ Mos 08/16/2014, 09/15/2016, 09/25/2019, 09/23/2020  . Influenza-Unspecified 09/05/2015, 09/15/2016, 09/04/2017  . Pneumococcal Polysaccharide-23 09/14/2015  . Tdap 02/17/2014     Health Maintenance  Topic Date Due  . Hepatitis C Screening  Never done  . OPHTHALMOLOGY EXAM  Never done  . COVID-19 Vaccine (1) Never done  . HIV Screening  06/19/2029 (Originally [birth year]1994)  . HEMOGLOBIN A1C  02/09/2021  . URINE MICROALBUMIN  04/16/2021  . FOOT EXAM  09/23/2021  . TETANUS/TDAP  02/18/2024  . INFLUENZA VACCINE  Completed  . PNEUMOCOCCAL POLYSACCHARIDE VACCINE AGE 95-64 HIGH RISK  Completed  . PAP SMEAR-Modifier  Discontinued     Wt Readings from Last 3 Encounters:  09/23/20 207 lb 9.6 oz (94.2 kg)  09/09/20 205 lb (93 kg)  08/24/20 214 lb (97.1 kg)   BP Readings from Last 3 Encounters:  09/23/20 118/75  09/09/20 137/81  08/28/20 126/74   Pulse Readings from Last 3 Encounters:  09/23/20 98  09/09/20 63  08/28/20 89     Past Medical History:  Diagnosis Date  . Anemia   . Angio-edema   . Anxiety   . Complication of anesthesia    slow to wake up  . Depression    rx presc but not taken  . Diabetes mellitus without complication ([place])   . Dilated aortic root ([place])    a. 05/2020 Echo: Ao root 57mm.  . Fibromyalgia   . GERD (gastroesophageal reflux disease)    no meds  . H/o Sinus infection   . Headache(784.0)    tension and with anxiety hx  . Hepatic steatosis   . History of kidney stones   . MVP (mitral valve prolapse)    a. Pt told MVP present as child; b. 05/2020 Echo: EF 55-60%, no rwma, nl RV size/fxn, triv MR, mild AI. Ao root 42mm.  . Pneumonia    hx  . Seizures ([place])    a. non epileptic events per epilepsy monitoring unit; b. last seizure July 2015- does not convulse but has a sleep effect  . Sleep apnea    has cpap does not use      Past Surgical History:  Procedure Laterality Date  . ABDOMINAL  HYSTERECTOMY N/A 08/15/2014   Procedure: HYSTERECTOMY ABDOMINAL WITH CYSTOSCOPY;  Surgeon: Sanjuana Kava, MD;  Location: [facility];  Service: Gynecology;  Laterality: N/A;  . CESAREAN SECTION N/A   . CYSTOSCOPY/RETROGRADE/URETEROSCOPY Left 05/23/2019   Procedure: CYSTOSCOPY/RETROGRADE/URETEROSCOPY;  Surgeon: Abbie Sons, MD;  Location: [facility];  Service: Urology;  Laterality: Left;  . KNEE ARTHROSCOPY    . SHOULDER ARTHROSCOPY WITH SUBACROMIAL DECOMPRESSION AND OPEN ROTATOR C Left 11/27/2015   Procedure: LEFT SHOULDER ARTHROSCOPY WITH SUBACROMIAL DECOMPRESSION, MINI OPEN ROTATOR CUFF REPAIR;  Surgeon: Netta Cedars, MD;  Location: [place];  Service: Orthopedics;  Laterality: Left;  . TUBAL LIGATION        Family History  Problem Relation Age of Onset  . Diabetes Mother   . Hypertension Mother   . Healthy Father   . Breast cancer Maternal Grandmother   .  Cancer Maternal Grandfather        brain, lung, liver  . Heart attack Paternal Grandfather       Social History   Substance and Sexual Activity  Drug Use No  ,   Social History   Substance and Sexual Activity  Alcohol Use No  ,   Social History   Tobacco Use  Smoking Status Never Smoker  Smokeless Tobacco Never Used  ,   Social History   Substance and Sexual Activity  Sexual Activity Yes  . Birth control/protection: Surgical    Current Outpatient Medications on File Prior to Visit  Medication Sig Dispense Refill  . amitriptyline (ELAVIL) 10 MG tablet Take 50 mg by mouth at bedtime.     . blood glucose meter kit and supplies Dispense based on patient and insurance preference. Use to check blood glucose levels fasting in the morning and 2 hours after largest meal daily.  (FOR ICD-10 E10.9, E11.9). 1 each 0  . FLUoxetine (PROZAC) 20 MG tablet Take 1 tablet (20 mg total) by mouth daily. 90 tablet 1  . metoprolol succinate (TOPROL XL) 25 MG 24 hr tablet Take 0.5 tablets (12.5 mg total) by mouth daily. 45 tablet 1  .  valACYclovir (VALTREX) 500 MG tablet Take 500 mg by mouth daily as needed (cold sores).     . Vitamin D, Ergocalciferol, (DRISDOL) 1.25 MG (50000 UT) CAPS capsule Take one tablet wkly 12 capsule 3  . HYDROcodone-acetaminophen (NORCO) 5-325 MG tablet Take 2 tablets by mouth every 6 (six) hours as needed for moderate pain. 6 tablet 0  . HYDROcodone-homatropine (HYCODAN) 5-1.5 MG/5ML syrup Take 5 mLs by mouth every 8 (eight) hours as needed for cough. 120 mL 0  . hyoscyamine (LEVSIN SL) 0.125 MG SL tablet 1 tablet sublingual every 6-8 hours as needed for abdominal pain. 30 tablet 1  . ipratropium (ATROVENT) 0.03 % nasal spray Place 2 sprays into both nostrils every 12 (twelve) hours. 30 mL 0  . ondansetron (ZOFRAN) 4 MG tablet Take 1 tablet (4 mg total) by mouth every 8 (eight) hours as needed for nausea or vomiting. 30 tablet 0  . tamsulosin (FLOMAX) 0.4 MG CAPS capsule Take 1 capsule (0.4 mg total) by mouth daily for 30 doses. 30 capsule 0  . triamcinolone cream (KENALOG) 0.1 % Apply 1 application topically 2 (two) times daily. 30 g 0   No current facility-administered medications on file prior to visit.    Allergies: Sulfa antibiotics, Topiramate, Duloxetine hcl, Ibuprofen, Metformin and related, and Penicillins  Review of Systems: General:   Denies fever, chills, unexplained weight loss, +fatigue Optho/Auditory:   Denies visual changes, blurred vision/LOV Respiratory:   Denies SOB, DOE more than baseline levels.   Cardiovascular:   Denies chest pain, palpitations, new onset peripheral edema  Gastrointestinal:   Denies nausea, vomiting, diarrhea.  Genitourinary: Denies dysuria, freq/ urgency, flank pain or discharge from genitals.  Endocrine:     Denies hot or cold intolerance, polyuria, polydipsia. Musculoskeletal:   Denies unexplained myalgias, joint swelling, unexplained arthralgias, gait problems.  Skin:  Denies rash, suspicious lesions Neurological:     Denies dizziness, unexplained  weakness, numbness  Psychiatric/Behavioral:   Denies mood changes, suicidal or homicidal ideations, hallucinations    Objective:    Blood pressure 118/75, pulse 98, temperature 98.4 F (36.9 C), temperature source Oral, height 5' 8.25" (1.734 m), weight 207 lb 9.6 oz (94.2 kg), last menstrual period 08/15/2014, SpO2 96 %. Body mass index is 31.33 kg/m.  General Appearance:    Alert, cooperative, no distress, appears stated age  Head:    Normocephalic, without obvious abnormality, atraumatic  Eyes:    PERRL, conjunctiva/corneas clear, EOM's intact, both eyes  Ears:    Normal TM's and external ear canals, both ears  Nose:   Nares normal, septum midline, mucosa normal, no drainage    or sinus tenderness  Throat:   Lips w/o lesion, mucosa moist, and tongue normal; teeth and   gums normal  Neck:   Supple, symmetrical, trachea midline, no adenopathy;    thyroid:  no enlargement/tenderness/nodules; no carotid   bruit appreciated  Back:     Symmetric, no curvature, ROM normal, no CVA tenderness  Lungs:     Clear to auscultation bilaterally, respirations unlabored, no       Wh/ R/ R  Chest Wall:    No tenderness or gross deformity; normal excursion   Heart:    Regular rate and rhythm, S1 and S2 normal, no murmur  Breast Exam:    No tenderness, masses, or nipple abnormality b/l; no d/c  Abdomen:     Soft, non-tender, bowel sounds active all four quadrants, No   G/R/R, no masses, no organomegaly  Genitalia:    Deferred. No concerns/sxs.  Rectal:    Deferred. No concerns/sxs.  Extremities:   Extremities normal, atraumatic, no cyanosis or gross edema  Pulses:   2+ and symmetric all extremities  Skin:   Warm, dry, Skin color, texture, turgor normal, no obvious rashes or lesions Psych: No HI/SI, judgement and insight good, Euthymic mood. Full Affect.  Neurologic:   CNII-XII grossly intact, normal strength, sensation and reflexes throughout

## 2020-12-14 DIAGNOSIS — N393 Stress incontinence (female) (male): Secondary | ICD-10-CM | POA: Diagnosis not present

## 2020-12-14 DIAGNOSIS — Z01419 Encounter for gynecological examination (general) (routine) without abnormal findings: Secondary | ICD-10-CM | POA: Diagnosis not present

## 2020-12-14 DIAGNOSIS — Z1231 Encounter for screening mammogram for malignant neoplasm of breast: Secondary | ICD-10-CM | POA: Diagnosis not present

## 2021-01-25 ENCOUNTER — Ambulatory Visit: Payer: PPO | Admitting: Physician Assistant

## 2021-02-01 DIAGNOSIS — M1711 Unilateral primary osteoarthritis, right knee: Secondary | ICD-10-CM | POA: Diagnosis not present

## 2021-02-01 DIAGNOSIS — M5416 Radiculopathy, lumbar region: Secondary | ICD-10-CM | POA: Diagnosis not present

## 2021-02-05 ENCOUNTER — Telehealth: Payer: Self-pay | Admitting: Physician Assistant

## 2021-02-05 NOTE — Telephone Encounter (Signed)
Pt advised no available apts in our office. Suggested to go to UC for eval and treatment. Pt agreeable. AS, CMA

## 2021-03-10 ENCOUNTER — Telehealth: Payer: Self-pay | Admitting: Physician Assistant

## 2021-03-10 ENCOUNTER — Emergency Department: Payer: PPO

## 2021-03-10 ENCOUNTER — Emergency Department
Admission: EM | Admit: 2021-03-10 | Discharge: 2021-03-10 | Disposition: A | Discharge status: Home / Self Care | Payer: PPO | Attending: Emergency Medicine | Admitting: Emergency Medicine

## 2021-03-10 ENCOUNTER — Ambulatory Visit: Payer: PPO | Admitting: Physician Assistant

## 2021-03-10 ENCOUNTER — Encounter: Payer: Self-pay | Admitting: Emergency Medicine

## 2021-03-10 ENCOUNTER — Other Ambulatory Visit: Payer: Self-pay

## 2021-03-10 DIAGNOSIS — E119 Type 2 diabetes mellitus without complications: Secondary | ICD-10-CM | POA: Diagnosis not present

## 2021-03-10 DIAGNOSIS — Z79899 Other long term (current) drug therapy: Secondary | ICD-10-CM | POA: Diagnosis not present

## 2021-03-10 DIAGNOSIS — N83201 Unspecified ovarian cyst, right side: Secondary | ICD-10-CM | POA: Diagnosis not present

## 2021-03-10 DIAGNOSIS — R1031 Right lower quadrant pain: Secondary | ICD-10-CM | POA: Diagnosis not present

## 2021-03-10 DIAGNOSIS — R1030 Lower abdominal pain, unspecified: Secondary | ICD-10-CM | POA: Diagnosis present

## 2021-03-10 DIAGNOSIS — I10 Essential (primary) hypertension: Secondary | ICD-10-CM | POA: Insufficient documentation

## 2021-03-10 DIAGNOSIS — N83209 Unspecified ovarian cyst, unspecified side: Secondary | ICD-10-CM | POA: Insufficient documentation

## 2021-03-10 LAB — CBC
HCT: 39.1 % (ref 36.0–46.0)
Hemoglobin: 13.1 g/dL (ref 12.0–15.0)
MCH: 28.5 pg (ref 26.0–34.0)
MCHC: 33.5 g/dL (ref 30.0–36.0)
MCV: 85 fL (ref 80.0–100.0)
Platelets: 179 10*3/uL (ref 150–400)
RBC: 4.6 MIL/uL (ref 3.87–5.11)
RDW: 13.3 % (ref 11.5–15.5)
WBC: 10 10*3/uL (ref 4.0–10.5)
nRBC: 0 % (ref 0.0–0.2)

## 2021-03-10 LAB — COMPREHENSIVE METABOLIC PANEL
ALT: 17 U/L (ref 0–44)
AST: 16 U/L (ref 15–41)
Albumin: 3.7 g/dL (ref 3.5–5.0)
Alkaline Phosphatase: 65 U/L (ref 38–126)
Anion gap: 7 (ref 5–15)
BUN: 12 mg/dL (ref 6–20)
CO2: 25 mmol/L (ref 22–32)
Calcium: 9 mg/dL (ref 8.9–10.3)
Chloride: 105 mmol/L (ref 98–111)
Creatinine, Ser: 0.62 mg/dL (ref 0.44–1.00)
GFR, Estimated: >60 mL/min (ref >60)
Glucose, Bld: 150 mg/dL — ABNORMAL HIGH (ref 70–99)
Potassium: 4.1 mmol/L (ref 3.5–5.1)
Sodium: 137 mmol/L (ref 135–145)
Total Bilirubin: 0.5 mg/dL (ref 0.3–1.2)
Total Protein: 7.1 g/dL (ref 6.5–8.1)

## 2021-03-10 LAB — URINALYSIS, COMPLETE (UACMP) WITH MICROSCOPIC
Bacteria, UA: NONE SEEN
Bilirubin Urine: NEGATIVE
Glucose, UA: 50 mg/dL — AB
Hgb urine dipstick: NEGATIVE
Ketones, ur: NEGATIVE mg/dL
Leukocytes,Ua: NEGATIVE
Nitrite: NEGATIVE
Protein, ur: NEGATIVE mg/dL
Specific Gravity, Urine: 1.021 (ref 1.005–1.030)
pH: 7 (ref 5.0–8.0)

## 2021-03-10 LAB — LIPASE, BLOOD: Lipase: 34 U/L (ref 11–51)

## 2021-03-10 MED ORDER — TRAMADOL HCL 50 MG PO TABS: 50.0000 mg | ORAL_TABLET | Freq: Four times a day (QID) | ORAL | 0 refills | Status: DC | PRN

## 2021-03-10 MED ORDER — IOHEXOL 300 MG/ML  SOLN
100.0000 mL | Freq: Once | INTRAMUSCULAR | Status: AC | PRN
  Administered 2021-03-10: 100 mL via INTRAVENOUS
  Filled 2021-03-10: qty 100

## 2021-03-10 MED ORDER — ONDANSETRON HCL 4 MG/2ML IJ SOLN
4.0000 mg | Freq: Once | INTRAMUSCULAR | Status: AC
  Administered 2021-03-10: 4 mg via INTRAVENOUS
  Filled 2021-03-10: qty 2

## 2021-03-10 NOTE — ED Triage Notes (Signed)
Pt comes into the ED via POV c/o RLW abdominal pain.  PT states that sometimes it is on both sides, but today it is on the right.  Pt states she has to hold her stomach when getting into a standing or seated position.  Pt states it hurts worse when pushed on.  Pt has some nausea, but denies any diarrhea.  PT ambulatory to triage at this time and in NAD with even and unlabored respirations.

## 2021-03-10 NOTE — ED Notes (Signed)
Patient transported to CT 

## 2021-03-11 ENCOUNTER — Ambulatory Visit: Payer: PPO | Admitting: Nurse Practitioner

## 2021-03-11 NOTE — Telephone Encounter (Signed)
Pt scheduled for 03/11/21 for "Stomach issues, pain when walking, pain can be severe arrive at 3:45 ba"  Called patient to inquire about symptoms. Pt states she is in extreme pain in abdominal area around belly button and can barely move. She is having difficulty standing and walking due to the pain. I advised patient to go to ED for emergent evaluation to rule out possible appendicitis. Pt verbalized understanding and went to [facility]. Please see ED notes. AS, CMA

## 2021-03-13 NOTE — ED Provider Notes (Signed)
[facility] Emergency Department Provider Note   ____________________________________________    I have reviewed the triage vital signs and the nursing notes.   HISTORY  Chief Complaint Abdominal Pain     HPI Samantha [name] is a 40 y.o. female who presents with complaints of lower abdominal pain.  Patient describes pain is been ongoing for several days now.  She describes the pain as moderate and cramping, has not been taking anything for this.  Denies fevers chills or nausea.  Sometimes pain seems worse with bowel movement.  No history of diverticulitis.  Does have a history of a partial hysterectomy.  Denies vaginal discharge.  No vaginal bleeding.  Past Medical History:  Diagnosis Date  . Anemia   . Angio-edema   . Anxiety   . Complication of anesthesia    slow to wake up  . Depression    rx presc but not taken  . Diabetes mellitus without complication ([place])   . Dilated aortic root ([place])    a. 05/2020 Echo: Ao root 25mm.  . Fibromyalgia   . GERD (gastroesophageal reflux disease)    no meds  . H/o Sinus infection   . Headache(784.0)    tension and with anxiety hx  . Hepatic steatosis   . History of kidney stones   . MVP (mitral valve prolapse)    a. Pt told MVP present as child; b. 05/2020 Echo: EF 55-60%, no rwma, nl RV size/fxn, triv MR, mild AI. Ao root 52mm.  . Pneumonia    hx  . Seizures ([place])    a. non epileptic events per epilepsy monitoring unit; b. last seizure July 2015- does not convulse but has a sleep effect  . Sleep apnea    has cpap does not use    Patient Active Problem List   Diagnosis Date Noted  . Chest pain 04/08/2020  . SOB (shortness of breath) 04/08/2020  . Palpitations 04/08/2020  . Essential hypertension 06/12/2019  . Mood disorder ([place]) 06/12/2019  . Ureteral calculus 05/20/2019  . Renal colic 75/09/2584  . Hydronephrosis with urinary obstruction due to ureteral calculus 05/20/2019  . Irritability and  anger 03/06/2019  . Heartburn 03/04/2019  . Chronic rhinitis 03/04/2019  . Adverse food reaction 03/04/2019  . Globus sensation 02/28/2019  . Finger pain, left 01/01/2019  . Allergy history unknown- possible throat closure sensation to certain foods 27/78/2423  . Neuropathic pain 09/04/2018  . Chronic radicular pain of lower back 09/04/2018  . Lumbar foraminal stenosis-L5-S1 09/04/2018  . Osteoarthritis of spine with radiculopathy, lumbar region 09/04/2018  . Obesity, Class I, BMI 30-34.9 09/04/2018  . Piriformis syndrome, left 07/12/2018  . Single episode of elevated blood pressure 07/12/2018  . Hypertriglyceridemia 04/11/2018  . Low level of high density lipoprotein (HDL) 04/11/2018  . Vitamin D insufficiency 04/11/2018  . Epigastric abdominal pain with N 04/06/2018  . History of vitamin D deficiency 04/06/2018  . Spondylosis without myelopathy or radiculopathy, lumbar region 10/03/2017  . Acute folliculitis- likely due to MRSA, they are draining 06/19/2017  . Personal history of MRSA (methicillin resistant Staphylococcus aureus) 06/19/2017  . Chronic diarrhea  06/19/2017  . Contact dermatitis and eczema- due to tape\ Band-Aid 06/19/2017  . Abdominal pain, LUQ 05/02/2017  . Abdominal pain, LLQ 05/02/2017  . Hx of diarrhea 05/02/2017  . Environmental and seasonal allergies 09/29/2016  . GERD (gastroesophageal reflux disease) 09/29/2016  . Bipolar 1 disorder - Anxiety 09/28/2016  . Fibromyalgia 08/11/2016  . Type  2 diabetes mellitus with complication, without long-term current use of insulin ([place]) 12/29/2015  . OSA on CPAP 12/08/2014  . Obstructive sleep apnea syndrome, moderate 11/24/2014  . Fatty liver 09/05/2014  . S/P abdominal hysterectomy 08/15/2014  . Bladder dysfunction 04/30/2014  . Nephrolithiasis 03/04/2014  . Agenesis, corpus callosum ([place]) 02/17/2014  . GAD (generalized anxiety disorder) 02/17/2014  . History of blood transfusion 02/17/2014  . Obesity (BMI  30-39.9) 02/17/2014    Past Surgical History:  Procedure Laterality Date  . ABDOMINAL HYSTERECTOMY N/A 08/15/2014   Procedure: HYSTERECTOMY ABDOMINAL WITH CYSTOSCOPY;  Surgeon: Sanjuana Kava, MD;  Location: [facility];  Service: Gynecology;  Laterality: N/A;  . CESAREAN SECTION N/A   . CYSTOSCOPY/RETROGRADE/URETEROSCOPY Left 05/23/2019   Procedure: CYSTOSCOPY/RETROGRADE/URETEROSCOPY;  Surgeon: Abbie Sons, MD;  Location: [facility];  Service: Urology;  Laterality: Left;  . KNEE ARTHROSCOPY    . SHOULDER ARTHROSCOPY WITH SUBACROMIAL DECOMPRESSION AND OPEN ROTATOR C Left 11/27/2015   Procedure: LEFT SHOULDER ARTHROSCOPY WITH SUBACROMIAL DECOMPRESSION, MINI OPEN ROTATOR CUFF REPAIR;  Surgeon: Netta Cedars, MD;  Location: [facility];  Service: Orthopedics;  Laterality: Left;  . TUBAL LIGATION      Prior to Admission medications   Medication Sig Start Date End Date Taking? Authorizing Provider  traMADol (ULTRAM) 50 MG tablet Take 1 tablet (50 mg total) by mouth every 6 (six) hours as needed. 03/10/21 03/10/22 Yes Lavonia Drafts, MD  amitriptyline (ELAVIL) 10 MG tablet Take 50 mg by mouth at bedtime.  07/19/19   [provider]  blood glucose meter kit and supplies Dispense based on patient and insurance preference. Use to check blood glucose levels fasting in the morning and 2 hours after largest meal daily.  (FOR ICD-10 E10.9, E11.9). 04/06/18   Opalski, Neoma Laming, DO  FLUoxetine (PROZAC) 20 MG tablet Take 1 tablet (20 mg total) by mouth daily. 04/16/20   Lorrene Reid, PA-C  HYDROcodone-acetaminophen (NORCO) 5-325 MG tablet Take 2 tablets by mouth every 6 (six) hours as needed for moderate pain. 09/09/20   Naaman Plummer, MD  HYDROcodone-homatropine (HYCODAN) 5-1.5 MG/5ML syrup Take 5 mLs by mouth every 8 (eight) hours as needed for cough. 08/24/20   Samuel Bouche, NP  hyoscyamine (LEVSIN SL) 0.125 MG SL tablet 1 tablet sublingual every 6-8 hours as needed for abdominal pain. 02/18/20   Noralyn Pick, NP  ipratropium (ATROVENT) 0.03 % nasal spray Place 2 sprays into both nostrils every 12 (twelve) hours. 08/24/20   Samuel Bouche, NP  liraglutide (VICTOZA) 18 MG/3ML SOPN Start 0.$RemoveBefo'6mg'QWWWfwedRuC$  SQ once a day for 7 days, then increase to 1.$RemoveBefor'2mg'OiEdqhJOMoke$  once a day 09/23/20   Abonza, Maritza, PA-C  metoprolol succinate (TOPROL XL) 25 MG 24 hr tablet Take 0.5 tablets (12.5 mg total) by mouth daily. 04/08/20   End, Harrell Gave, MD  ondansetron (ZOFRAN) 4 MG tablet Take 1 tablet (4 mg total) by mouth every 8 (eight) hours as needed for nausea or vomiting. 02/18/20   Noralyn Pick, NP  triamcinolone cream (KENALOG) 0.1 % Apply 1 application topically 2 (two) times daily. 06/03/20   Lorrene Reid, PA-C  valACYclovir (VALTREX) 500 MG tablet Take 500 mg by mouth daily as needed (cold sores).     [provider]  Vitamin D, Ergocalciferol, (DRISDOL) 1.25 MG (50000 UT) CAPS capsule Take one tablet wkly 10/15/19   Opalski, Deborah, DO     Allergies Sulfa antibiotics, Topiramate, Duloxetine hcl, Ibuprofen, Metformin and related, and Penicillins  Family History  Problem Relation Age of Onset  .  Diabetes Mother   . Hypertension Mother   . Healthy Father   . Breast cancer Maternal Grandmother   . Cancer Maternal Grandfather        brain, lung, liver  . Heart attack Paternal Grandfather     Social History Social History   Tobacco Use  . Smoking status: Never Smoker  . Smokeless tobacco: Never Used  Vaping Use  . Vaping Use: Never used  Substance Use Topics  . Alcohol use: No  . Drug use: No    Review of Systems  Constitutional: No fever/chills Eyes: No visual changes.  ENT: No sore throat. Cardiovascular: Denies chest pain. Respiratory: Denies shortness of breath. Gastrointestinal: Lower abdominal pain, bilateral Genitourinary: Negative for dysuria.  No frequency Musculoskeletal: Negative for back pain. Skin: Negative for rash. Neurological: Negative for headaches or  weakness   ____________________________________________   PHYSICAL EXAM:  VITAL SIGNS: ED Triage Vitals  Enc Vitals Group     BP 03/10/21 1243 (!) 143/80     Pulse Rate 03/10/21 1243 91     Resp 03/10/21 1243 17     Temp 03/10/21 1243 98.3 F (36.8 C)     Temp Source 03/10/21 1243 Oral     SpO2 03/10/21 1243 97 %     Weight 03/10/21 1241 95.3 kg (210 lb)     Height 03/10/21 1241 1.702 m ($Remove'5\' 7"'gQTvflS$ )     Head Circumference --      Peak Flow --      Pain Score 03/10/21 1240 7     Pain Loc --      Pain Edu? --      Excl. in [place]? --     Constitutional: Alert and oriented. No acute distress.   Nose: No congestion/rhinnorhea. Mouth/Throat: Mucous membranes are moist.   Neck:  Painless ROM Cardiovascular: Normal rate, regular rhythm. Grossly normal heart sounds.  Good peripheral circulation. Respiratory: Normal respiratory effort.  No retractions. Lungs CTAB. Gastrointestinal: Soft and nontender. No distention.  No CVA tenderness.  Reassuring exam Genitourinary: deferred Musculoskeletal: Warm and well perfused Neurologic:  Normal speech and language. No gross focal neurologic deficits are appreciated.  Skin:  Skin is warm, dry and intact. No rash noted. Psychiatric: Mood and affect are normal. Speech and behavior are normal.  ____________________________________________   LABS (all labs ordered are listed, but only abnormal results are displayed)  Labs Reviewed  COMPREHENSIVE METABOLIC PANEL - Abnormal; Notable for the following components:      Result Value   Glucose, Bld 150 (*)    All other components within normal limits  URINALYSIS, COMPLETE (UACMP) WITH MICROSCOPIC - Abnormal; Notable for the following components:   Color, Urine YELLOW (*)    APPearance HAZY (*)    Glucose, UA 50 (*)    All other components within normal limits  LIPASE, BLOOD  CBC    ____________________________________________  EKG  None ____________________________________________  RADIOLOGY  CT abdomen pelvis negative for diverticulitis, possible ruptured ovarian cyst ____________________________________________   PROCEDURES  Procedure(s) performed: No  Procedures   Critical Care performed: No ____________________________________________   INITIAL IMPRESSION / ASSESSMENT AND PLAN / ED COURSE  Pertinent labs & imaging results that were available during my care of the patient were reviewed by me and considered in my medical decision making (see chart for details).  Patient presents with lower abdominal pain, slightly more on the left than the right, pain is been ongoing for several days and has not worsened significantly.  No fevers  or chills.  Some discomfort with bowel movement.  Suspicious for possible diverticulitis, colitis, ovarian cyst.  Does describe some nausea, treated with IV Zofran, CT obtained  CT negative for diverticulitis or colitis, most consistent with ruptured ovarian cyst.  Discussed this with her and she reports she has had this in the past and it does feel similar.  Supportive outpatient care with close follow-up, if any worsening return to the emergency department      ____________________________________________   FINAL CLINICAL IMPRESSION(S) / ED DIAGNOSES  Final diagnoses:  Cyst of ovary, unspecified laterality        Note:  This document was prepared using Dragon voice recognition software and may include unintentional dictation errors.   Lavonia Drafts, MD 03/13/21 [phone]

## 2021-03-30 DIAGNOSIS — R102 Pelvic and perineal pain: Secondary | ICD-10-CM | POA: Diagnosis not present

## 2021-03-30 DIAGNOSIS — N83209 Unspecified ovarian cyst, unspecified side: Secondary | ICD-10-CM | POA: Diagnosis not present

## 2021-05-04 DIAGNOSIS — M24541 Contracture, right hand: Secondary | ICD-10-CM | POA: Diagnosis not present

## 2021-05-04 DIAGNOSIS — M20022 Boutonniere deformity of left finger(s): Secondary | ICD-10-CM | POA: Diagnosis not present

## 2021-05-04 DIAGNOSIS — M24549 Contracture, unspecified hand: Secondary | ICD-10-CM | POA: Diagnosis not present

## 2021-05-04 DIAGNOSIS — M24542 Contracture, left hand: Secondary | ICD-10-CM | POA: Diagnosis not present

## 2021-05-04 DIAGNOSIS — M20021 Boutonniere deformity of right finger(s): Secondary | ICD-10-CM | POA: Diagnosis not present

## 2021-05-10 DIAGNOSIS — M25642 Stiffness of left hand, not elsewhere classified: Secondary | ICD-10-CM | POA: Diagnosis not present

## 2021-05-10 DIAGNOSIS — M24549 Contracture, unspecified hand: Secondary | ICD-10-CM | POA: Diagnosis not present

## 2021-05-10 DIAGNOSIS — M25641 Stiffness of right hand, not elsewhere classified: Secondary | ICD-10-CM | POA: Diagnosis not present

## 2021-05-10 DIAGNOSIS — Q681 Congenital deformity of finger(s) and hand: Secondary | ICD-10-CM | POA: Diagnosis not present

## 2021-05-27 DIAGNOSIS — N951 Menopausal and female climacteric states: Secondary | ICD-10-CM | POA: Diagnosis not present

## 2021-05-27 DIAGNOSIS — N393 Stress incontinence (female) (male): Secondary | ICD-10-CM | POA: Diagnosis not present

## 2021-06-17 ENCOUNTER — Other Ambulatory Visit: Payer: Self-pay | Admitting: Obstetrics & Gynecology

## 2021-07-12 NOTE — H&P (Addendum)
Samantha [name] is a 40 y.o.  P: 2-1-1-2, presents for placement of tension free vaginal tape because of female stress incontinence, urinary urgency an incomplete bladder emptying. Since patient's hysterectomy in 2015 she has had worsening problems with leaking of urine when she coughs, laughs an sneezes. Additionally she notes post void dribbling, sensation of incomplete bladder emptying and some urgency at times. She denies dyspareunia, vaginitis symptoms, pelvic pain, dysuria or hematuria. She does admit to frequent bowel movements but the consistency is normal The patient was given medical and surgical management options for management of her symptoms but she has decided to proceed with surgical management.   Past Medical History  OB History: G: 4;  P: 2-1-1-2; SVB 2008 (6 lbs. 9 oz.) and C-section 2010  GYN History: menarche : 40 YO;   LMP: Hysterectomy; Denies history of abnormal PAP smear.   Last PAP smear: 2018-normal  Medical History: Hypertension, Hepatitis Steatosis, Renal Stones, Sleep Apnea, Seizure, Aortic Root Dilatation (41 mm June 2021), Diabetes Mellitus, Migraine, Anxiety, Mitral Valve Prolapse, GERD, Lumbar Radiculopathy, Fibromyalgia, HSV-2 and Vitamin D Deficiency.  Surgical History: 2010 Tubal Sterilization; 2015 Hysterectomy; 2016 Left Shoulder Repair and 2020 Right Knee Arthroscopy  Denies problems with anesthesia but had history of blood transfusion after hysterectomy in 2015  Family History: Breast Cancer, Hypertension, Lupus, Rheumatoid Arthritis, Diabetes Mellitus, Heart Disease, Brain Cancer, Liver Cancer and Lung Cancer.  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; Denies tobacco or alcohol use   Medications: Victoza 2-Pak 0.6 mg/0.1 mL [place] daily Venlafaxine ER 75 mg daily Amitriptyline 50 mg daily Vitamin D 50, 000 units weekly Fluoxetine 20 mg daily Tramadol 50 mg po every 6 hours prn Metoprolol Succinate ER 25 mg  1/2 tablet daily Valacyclovir 500 mg  daily  Allergies  Allergen Reactions   Sulfa Antibiotics Anaphylaxis and Swelling   Topiramate Swelling    Tongue   Duloxetine Hcl Other (See Comments)    Weight gain, feels "crazy"   Ibuprofen     Due to taking Elavil, causes counter headaches when she takes Ibuprofen   Metformin And Related Other (See Comments)    GI Upset   Penicillins Rash    Has patient had a PCN reaction causing immediate rash, facial/tongue/throat swelling, SOB or lightheadedness with hypotension: Yes Has patient had a PCN reaction causing severe rash involving mucus membranes or skin necrosis: No Has patient had a PCN reaction that required hospitalization No Has patient had a PCN reaction occurring within the last 10 years: No If all of the above answers are "NO", then may proceed with Cephalosporin use.     Denies sensitivity to peanuts, shellfish, soy, latex or adhesives.  ROS: Admits to glasses , right knee swelling and headaches but   denies vision changes, nasal congestion, dysphagia, tinnitus, dizziness, hoarseness, cough,  chest pain, shortness of breath, nausea, vomiting, diarrhea,constipation,  urinary frequency, urgency  dysuria, hematuria, vaginitis symptoms, pelvic pain, swelling of joints,easy bruising,  myalgias, skin rashes, unexplained weight loss and except as is mentioned in the history of present illness, patient's review of systems is otherwise negative.    Physical Exam  Bp: 120/66;  P: 87 bpm; Temperature: 98.6 degrees F orally; Weight: 208 lbs.;  Height: 5' 9.25";  BMI: 34.5;  O2Sat.: 98 % (room air)  Neck: supple without masses or thyromegaly Lungs: clear to auscultation Heart: regular rate and rhythm Abdomen: soft, non-tender and no organomegaly Pelvic:EGBUS- wnl; vagina-normal rugae; uterus-cervix: surgically absen adnexae-no tenderness or masses Extremities:  no clubbing, cyanosis or edema   Assesment: Female Stress Incontinence   Disposition:  A discussion was held with  patient regarding the indication for her procedure(s) along with the risks, which include but are not limited to: reaction to anesthesia, damage to adjacent organs, infection, excessive bleeding, urinary retention and erosion of tension free vaginal tape.  The patient verbalized understanding of these risks and has consented to proceed with Placement of Tension Free Vaginal Tape and Cystoscopy at [facility] on July 30, 2021.   CSN# [id number]   Samantha J. Florene Glen, PA-C  for Dr. Mady Haagensen. Samantha Bun  MD ADDENDUM: I am very familiar with the patient and performed her prior hysterectomy.  I have reviewed the above documentation.  I agree with the history and physical and the Assessment and plan.   Patient was counseled about the risks of surgery the failure rate of A mid-urethral sling, she voiced understanding and had no inquiries.  I am the supervising physician and agree to all of the above.  Consent has been signed, witnessed and placed into chart.   [place]

## 2021-07-14 ENCOUNTER — Telehealth: Payer: Self-pay | Admitting: Internal Medicine

## 2021-07-14 NOTE — Telephone Encounter (Signed)
   [facility] Pre-operative Risk Assessment    Patient Name: Samantha [name]  DOB: October 16, 1981 MRN: [id number]  HEARTCARE STAFF:  - IMPORTANT!!!!!! Under Visit Info/Reason for Call, type in Other and utilize the format Clearance MM/DD/YY or Clearance TBD. Do not use dashes or single digits. - Please review there is not already an duplicate clearance open for this procedure. - If request is for dental extraction, please clarify the # of teeth to be extracted. - If the patient is currently at the dentist's office, call Pre-Op Callback Staff (MA/nurse) to input urgent request.  - If the patient is not currently in the dentist office, please route to the Pre-Op pool.  Request for surgical clearance:  What type of surgery is being performed? Transvaginal tape and cystoscopy   When is this surgery scheduled? TBD   What type of clearance is required (medical clearance vs. Pharmacy clearance to hold med vs. Both)? both  Are there any medications that need to be held prior to surgery and how long? Not listed, please advise if needed  Practice name and name of physician performing surgery? [facility]  What is the office phone number? [phone]   7.   What is the office fax number? [phone]  8.   Anesthesia type (None, local, MAC, general) ? Not listed    Ace Gins 07/14/2021, 3:42 PM  _________________________________________________________________   (provider comments below)

## 2021-07-15 NOTE — Telephone Encounter (Signed)
Will address and route to surgeon's office following office visit on 8/12.

## 2021-07-15 NOTE — Telephone Encounter (Signed)
I did call the pt as an opening did come open with Samantha [name], [facility] 07/16/21 @ 11:30. Pt agreeable to appt and thanked me for the call and the help. I will forward notes to [facility] for appt tomorrow. Will send FYI to suregon's office pt has appt 07/16/21.

## 2021-07-15 NOTE — Telephone Encounter (Signed)
   Name: Samantha [name]  DOB: 1981-05-01  MRN: [id number]  Primary Cardiologist: Nelva Bush, MD  Chart reviewed as part of pre-operative protocol coverage. Because of Terrion Spohr Marrin's past medical history and time since last visit, she will require a follow-up visit in order to better assess preoperative cardiovascular risk.  She has not been seen since 05/2020 (greater than 1 year ago).  Pre-op covering staff: - Please schedule appointment and call patient to inform them. If patient already had an upcoming appointment within acceptable timeframe, please add "pre-op clearance" to the appointment notes so provider is aware. - Please contact requesting surgeon's office via preferred method (i.e, phone, fax) to inform them of need for appointment prior to surgery.  No blood thinners listed to hold.  Charlie Pitter, PA-C  07/15/2021, 11:35 AM

## 2021-07-15 NOTE — Progress Notes (Signed)
Cardiology Office Note    Date:  07/16/2021   ID:  Samantha [name], DOB 10-May-1981, MRN [id number]  PCP:  Lorrene Reid, PA-C  Cardiologist:  Nelva Bush, MD  Electrophysiologist:  None   Chief Complaint: Preoperative cardiac risk stratification  History of Present Illness:   Samantha [name] is a 40 y.o. female with history of dilated aortic root, atypical chest pain, palpitations, DM2, HTN, HLD, COVID in 08/2020 status post Mab infusion, hepatic steatosis, fibromyalgia, morbid obesity, OSA not on CPAP, and GERD who presents for preoperative cardiac risk stratification.  She was evaluated in 04/2020 secondary to an incidental finding of an enlarged heart on CT of the abdomen/pelvis, which was performed in the setting of abdominal pain.  She was noted to have had multiple ER visits for chest wall pain.  At that time she reported exertional chest discomfort and swelling overlying the suprasternal area as well as intermittent palpitations.  She was tachycardic that day and placed on low-dose metoprolol.  Subsequent echo in 05/2020 showed a preserved LV systolic function with an EF of 55 to 60%, no regional wall motion abnormalities, normal LV diastolic function parameters, normal RV systolic function and ventricular cavity size, trivial mitral regurgitation, mild aortic insufficiency, and a mildly dilated aortic root measuring 41 mm.  She was last seen in 05/2020 continuing to note intermittent discomfort involving the left upper chest.  Her palpitations were quiescent following initiation of beta-blocker.  She underwent Lexiscan MPI in 06/2020 showed no significant ischemia or scar with an LVEF of 55%.  There was no significant coronary artery calcification noted on attenuation corrected CT images.  Hepatic steatosis was incidentally noted.  Overall, this was a low risk study.  She is scheduled for a transvaginal tape and cystoscopy procedure on 07/30/2021 and presents today for preoperative cardiac risk  stratification.  She comes in accompanied by her husband today and is doing very well from a cardiac perspective.  No symptoms concerning for angina.  She does continue to note chest wall discomfort at times when she is reaching or tightening objects with a wrench.  No exertional symptoms concerning for angina.  No dyspnea, palpitations, dizziness, presyncope, or syncope.  No lower extremity swelling, abdominal ascension, or orthopnea.  Palpitations remain quiescent on metoprolol.  She is able to achieve greater than 4 METs without cardiac limitation.   Labs independently reviewed: 03/2021 - Hgb 13.1, PLT 179, potassium 4.1, BUN 12, serum creatinine 0.62, albumin 3.7, AST/ALT normal 08/2020 - TSH normal, TC 149, TG 169, HDL 54, LDL 67, A1c 7.4  Past Medical History:  Diagnosis Date   Anemia    Angio-edema    Anxiety    Complication of anesthesia    slow to wake up   Depression    rx presc but not taken   Diabetes mellitus without complication ([place])    Dilated aortic root ([place])    a. 05/2020 Echo: Ao root 9m.   Fibromyalgia    GERD (gastroesophageal reflux disease)    no meds   H/o Sinus infection    Headache(784.0)    tension and with anxiety hx   Hepatic steatosis    History of kidney stones    MVP (mitral valve prolapse)    a. Pt told MVP present as child; b. 05/2020 Echo: EF 55-60%, no rwma, nl RV size/fxn, triv MR, mild AI. Ao root 458m   Pneumonia    hx   [place]izures [place]   a. non epileptic  events per epilepsy monitoring unit; b. last seizure July 2015- does not convulse but has a sleep effect   Sleep apnea    has cpap does not use    Past Surgical History:  Procedure Laterality Date   ABDOMINAL HYSTERECTOMY N/A 08/15/2014   Procedure: HYSTERECTOMY ABDOMINAL WITH CYSTOSCOPY;  Surgeon: Sanjuana Kava, MD;  Location: [facility];  Service: Gynecology;  Laterality: N/A;   CESAREAN SECTION N/A    CYSTOSCOPY/RETROGRADE/URETEROSCOPY Left 05/23/2019   Procedure:  CYSTOSCOPY/RETROGRADE/URETEROSCOPY;  Surgeon: Abbie Sons, MD;  Location: [facility];  Service: Urology;  Laterality: Left;   KNEE ARTHROSCOPY     SHOULDER ARTHROSCOPY WITH SUBACROMIAL DECOMPRESSION AND OPEN ROTATOR C Left 11/27/2015   Procedure: LEFT SHOULDER ARTHROSCOPY WITH SUBACROMIAL DECOMPRESSION, MINI OPEN ROTATOR CUFF REPAIR;  Surgeon: Netta Cedars, MD;  Location: [facility];  Service: Orthopedics;  Laterality: Left;   TUBAL LIGATION      Current Medications: Current Meds  Medication Sig   amitriptyline (ELAVIL) 10 MG tablet Take 50 mg by mouth at bedtime.    blood glucose meter kit and supplies Dispense based on patient and insurance preference. Use to check blood glucose levels fasting in the morning and 2 hours after largest meal daily.  (FOR ICD-10 E10.9, E11.9).   FLUoxetine (PROZAC) 20 MG tablet Take 1 tablet (20 mg total) by mouth daily.   hyoscyamine (LEVSIN SL) 0.125 MG SL tablet 1 tablet sublingual every 6-8 hours as needed for abdominal pain.   liraglutide (VICTOZA) 18 MG/3ML SOPN Inject 1.2 mg into the skin daily.   metoprolol succinate (TOPROL XL) 25 MG 24 hr tablet Take 0.5 tablets (12.5 mg total) by mouth daily.   valACYclovir (VALTREX) 500 MG tablet Take 500 mg by mouth daily as needed (cold sores).    Vitamin D, Ergocalciferol, (DRISDOL) 1.25 MG (50000 UT) CAPS capsule Take one tablet wkly    Allergies:   Sulfa antibiotics, Topiramate, Duloxetine hcl, Ibuprofen, Metformin and related, and Penicillins   Social History   Socioeconomic History   Marital status: Married    Spouse name: Not on file   Number of children: Not on file   Years of education: Not on file   Highest education level: Not on file  Occupational History   Not on file  Tobacco Use   Smoking status: Never   Smokeless tobacco: Never  Vaping Use   Vaping Use: Never used  Substance and Sexual Activity   Alcohol use: No   Drug use: No   Sexual activity: Yes    Birth control/protection:  Surgical  Other Topics Concern   Not on file  Social History Narrative   Not on file   Social Determinants of Health   Financial Resource Strain: Not on file  Food Insecurity: Not on file  Transportation Needs: Not on file  Physical Activity: Not on file  Stress: Not on file  Social Connections: Not on file     Family History:  The patient's family history includes Breast cancer in her maternal grandmother; Cancer in her maternal grandfather; Diabetes in her mother; Healthy in her father; Heart attack in her paternal grandfather; Hypertension in her mother.  ROS:   Review of Systems  Constitutional:  Negative for chills, diaphoresis, fever, malaise/fatigue and weight loss.  HENT:  Negative for congestion.   Eyes:  Negative for discharge and redness.  Respiratory:  Negative for cough, sputum production, shortness of breath and wheezing.   Cardiovascular:  Negative for chest pain, palpitations, orthopnea, claudication, leg  swelling and PND.       Improved chest wall discomfort  Gastrointestinal:  Negative for abdominal pain, heartburn, nausea and vomiting.  Musculoskeletal:  Negative for falls and myalgias.  Skin:  Negative for rash.  Neurological:  Negative for dizziness, tingling, tremors, sensory change, speech change, focal weakness, loss of consciousness and weakness.  Endo/Heme/Allergies:  Does not bruise/bleed easily.  Psychiatric/Behavioral:  Negative for substance abuse. The patient is not nervous/anxious.   All other systems reviewed and are negative.   EKGs/Labs/Other Studies Reviewed:    Studies reviewed were summarized above. The additional studies were reviewed today:  2D echo 05/18/2020: 1. Left ventricular ejection fraction, by estimation, is 55 to 60%. The  left ventricle has normal function. The left ventricle has no regional  wall motion abnormalities. Left ventricular diastolic parameters were  normal.   2. Right ventricular systolic function is normal.  The right ventricular  size is normal.   3. The mitral valve is normal in structure. Trivial mitral valve  regurgitation.   4. The aortic valve is tricuspid. Aortic valve regurgitation is mild.   5. Aortic dilatation noted. There is mild dilatation of the aortic root  measuring 41 mm. __________  Carlton Adam MPI 06/09/2020: Normal pharmacologic myocardial perfusion stress test with significant ischemia or scar. The left ventricular ejection fraction is normal (55%). There is no significant coronary artery calcification on the attenuation correction CT. Hepatic steatosis is incidentally noted. This is a low risk study.   EKG:  EKG is ordered today.  The EKG ordered today demonstrates NSR, 76 bpm, left axis deviation, LVH with T wave inversion in leads V1 through V5, poor R wave progression, and no significant change when compared to prior tracing  Recent Labs: 08/12/2020: TSH 1.500 03/10/2021: ALT 17; BUN 12; Creatinine, Ser 0.62; Hemoglobin 13.1; Platelets 179; Potassium 4.1; Sodium 137  Recent Lipid Panel    Component Value Date/Time   CHOL 149 08/12/2020 0925   TRIG 169 (H) 08/12/2020 0925   HDL 54 08/12/2020 0925   CHOLHDL 2.8 08/12/2020 0925   CHOLHDL 2.6 07/12/2009 0515   VLDL 10 07/12/2009 0515   LDLCALC 67 08/12/2020 0925    PHYSICAL EXAM:    VS:  BP 120/90 (BP Location: Left Arm, Patient Position: Sitting, Cuff Size: Large)   Pulse 76   Ht _0  (1.753 m)   Wt 207 lb (93.9 kg)   LMP 08/15/2014 Comment: partial hysterectomy  SpO2 98%   BMI 30.57 kg/m   BMI: Body mass index is 30.57 kg/m.  Physical Exam Vitals reviewed.  Constitutional:      Appearance: She is well-developed.  HENT:     Head: Normocephalic and atraumatic.  Eyes:     General:        Right eye: No discharge.        Left eye: No discharge.  Neck:     Vascular: No JVD.  Cardiovascular:     Rate and Rhythm: Normal rate and regular rhythm.     Pulses:          Dorsalis pedis pulses are 2+ on the  right side and 2+ on the left side.       Posterior tibial pulses are 2+ on the right side and 2+ on the left side.     Heart sounds: Normal heart sounds, S1 normal and S2 normal. Heart sounds not distant. No midsystolic click and no opening snap. No murmur heard.   No friction rub.  Pulmonary:  Effort: Pulmonary effort is normal. No respiratory distress.     Breath sounds: Normal breath sounds. No decreased breath sounds, wheezing or rales.  Chest:     Chest wall: No tenderness.  Abdominal:     General: There is no distension.     Palpations: Abdomen is soft.     Tenderness: There is no abdominal tenderness.  Musculoskeletal:     Cervical back: Normal range of motion.     Right lower leg: No edema.     Left lower leg: No edema.  Skin:    General: Skin is warm and dry.     Nails: There is no clubbing.  Neurological:     Mental Status: She is alert and oriented to person, place, and time.  Psychiatric:        Speech: Speech normal.        Behavior: Behavior normal.        Thought Content: Thought content normal.        Judgment: Judgment normal.    Wt Readings from Last 3 Encounters:  07/16/21 207 lb (93.9 kg)  03/10/21 210 lb (95.3 kg)  09/23/20 207 lb 9.6 oz (94.2 kg)     ASSESSMENT & PLAN:   Preoperative cardiac risk stratification: Scheduled for transvaginal tape and cystoscopy on 07/30/2021.  Per Revised Cardiac Risk Index she is low risk for noncardiac surgery, with an estimated rate of 0.9% of adverse cardiac event in the periprocedure time frame.  Per [facility] Activity Status Index, she can achieve greater than 4 METS without cardiac limitation.  She may proceed with noncardiac procedure at an overall low risk without further cardiac testing.  Atypical chest pain: Improved and no symptoms concerning for angina.  Recent echo and Lexiscan MPI were reassuring as outlined above.  No further cardiac testing is indicated at this time.  Dilated aortic root: Echo in 05/2020  demonstrated a mildly dilated aortic root measuring 41 mm.  Aortic valve was noted to be tricuspid.  No-show for CTA aorta in 06/2020 to establish baseline.  She prefers to schedule echo to trend aortic root.  If this is significantly enlarged we we will likely need to proceed with a CTA of the aorta.  Palpitations: Quiescent.  She remains on Toprol-XL.  HTN: Blood pressure is reasonably controlled in the office today.  She remains on Toprol-XL.  HLD: LDL 67 in 08/2020.  Followed by PCP.  Disposition: F/u with Dr. Saunders Revel or an APP in 3 to 45-month.   Medication Adjustments/Labs and Tests Ordered: Current medicines are reviewed at length with the patient today.  Concerns regarding medicines are outlined above. Medication changes, Labs and Tests ordered today are summarized above and listed in the Patient Instructions accessible in Encounters.   Signed, RChristell Faith PA-C 07/16/2021 12:12 PM     [place] [phone]

## 2021-07-16 ENCOUNTER — Other Ambulatory Visit: Payer: Self-pay

## 2021-07-16 ENCOUNTER — Encounter: Payer: Self-pay | Admitting: Physician Assistant

## 2021-07-16 ENCOUNTER — Ambulatory Visit (INDEPENDENT_AMBULATORY_CARE_PROVIDER_SITE_OTHER): Payer: PPO | Admitting: Physician Assistant

## 2021-07-16 VITALS — BP 120/90 | HR 76 | Ht 69.0 in | Wt 207.0 lb

## 2021-07-16 DIAGNOSIS — R002 Palpitations: Secondary | ICD-10-CM | POA: Diagnosis not present

## 2021-07-16 DIAGNOSIS — R0789 Other chest pain: Secondary | ICD-10-CM | POA: Diagnosis not present

## 2021-07-16 DIAGNOSIS — Z01810 Encounter for preprocedural cardiovascular examination: Secondary | ICD-10-CM

## 2021-07-16 DIAGNOSIS — I77810 Thoracic aortic ectasia: Secondary | ICD-10-CM | POA: Diagnosis not present

## 2021-07-16 DIAGNOSIS — I10 Essential (primary) hypertension: Secondary | ICD-10-CM | POA: Diagnosis not present

## 2021-07-16 NOTE — Patient Instructions (Signed)
Medication Instructions:  No changes at this time.   *If you need a refill on your cardiac medications before your next appointment, please call your pharmacy*   Lab Work: None  If you have labs (blood work) drawn today and your tests are completely normal, you will receive your results only by: [place] (if you have MyChart) OR A paper copy in the mail If you have any lab test that is abnormal or we need to change your treatment, we will call you to review the results.   Testing/Procedures: Your physician has requested that you have an echocardiogram.   Echocardiography is a painless test that uses sound waves to create images of your heart. It provides your doctor with information about the size and shape of your heart and how well your heart's chambers and valves are working. This procedure takes approximately one hour. There are no restrictions for this procedure.    Follow-Up: At [facility], you and your health needs are our priority.  As part of our continuing mission to provide you with exceptional heart care, we have created designated Provider Care Teams.  These Care Teams include your primary Cardiologist (physician) and Advanced Practice Providers (APPs -  Physician Assistants and Nurse Practitioners) who all work together to provide you with the care you need, when you need it.   Your next appointment:   3 month(s)  The format for your next appointment:   In Person  Provider:   Nelva Bush, MD or Christell Faith, PA-C

## 2021-07-26 ENCOUNTER — Encounter (HOSPITAL_BASED_OUTPATIENT_CLINIC_OR_DEPARTMENT_OTHER): Payer: Self-pay | Admitting: Obstetrics & Gynecology

## 2021-07-26 ENCOUNTER — Other Ambulatory Visit: Payer: Self-pay

## 2021-07-26 ENCOUNTER — Encounter (HOSPITAL_COMMUNITY): Admission: RE | Admit: 2021-07-26 | Payer: PPO | Source: Ambulatory Visit

## 2021-07-26 ENCOUNTER — Encounter (HOSPITAL_COMMUNITY)
Admission: RE | Admit: 2021-07-26 | Discharge: 2021-07-26 | Disposition: A | Discharge status: Home / Self Care | Payer: PPO | Source: Ambulatory Visit | Attending: Obstetrics & Gynecology | Admitting: Obstetrics & Gynecology

## 2021-07-26 DIAGNOSIS — Z01812 Encounter for preprocedural laboratory examination: Secondary | ICD-10-CM | POA: Diagnosis not present

## 2021-07-26 LAB — CBC
HCT: 40.9 % (ref 36.0–46.0)
Hemoglobin: 13.2 g/dL (ref 12.0–15.0)
MCH: 27.8 pg (ref 26.0–34.0)
MCHC: 32.3 g/dL (ref 30.0–36.0)
MCV: 86.3 fL (ref 80.0–100.0)
Platelets: 189 10*3/uL (ref 150–400)
RBC: 4.74 MIL/uL (ref 3.87–5.11)
RDW: 12.8 % (ref 11.5–15.5)
WBC: 8.3 10*3/uL (ref 4.0–10.5)
nRBC: 0 % (ref 0.0–0.2)

## 2021-07-26 LAB — BASIC METABOLIC PANEL
Anion gap: 10 (ref 5–15)
BUN: 15 mg/dL (ref 6–20)
CO2: 26 mmol/L (ref 22–32)
Calcium: 9 mg/dL (ref 8.9–10.3)
Chloride: 101 mmol/L (ref 98–111)
Creatinine, Ser: 0.69 mg/dL (ref 0.44–1.00)
GFR, Estimated: >60 mL/min (ref >60)
Glucose, Bld: 184 mg/dL — ABNORMAL HIGH (ref 70–99)
Potassium: 4 mmol/L (ref 3.5–5.1)
Sodium: 137 mmol/L (ref 135–145)

## 2021-07-26 NOTE — Progress Notes (Addendum)
Spoke w/ via phone for pre-op interview---pt  Lab needs dos---             Lab results-----EKG,CBC, BMP, T&S on 07/26/2021 in epic COVID test -----patient states asymptomatic no test needed Arrive at -------0730 07/30/21 NPO after MN NO Solid Food.  Clear liquids from MN until---0630 07/30/2021 Med rec completed Medications to take morning of surgery -prozac, valacyclovir, Metoprolol succinate ,levsin--- Diabetic medication ----hold victoza- Patient instructed no nail polish to be worn day of surgery Patient instructed to bring photo id and insurance card day of surgery Patient aware to have Driver (ride ) / husband will caregiver    for 24 hours after surgery  Patient Special Instructions ----- Pre-Op special Istructions ----- Patient verbalized understanding of instructions that were given at this phone interview. Patient denies shortness of breath, chest pain, fever, cough at this phone interview.

## 2021-07-27 ENCOUNTER — Other Ambulatory Visit: Payer: Self-pay | Admitting: Internal Medicine

## 2021-07-27 ENCOUNTER — Encounter (HOSPITAL_BASED_OUTPATIENT_CLINIC_OR_DEPARTMENT_OTHER): Payer: Self-pay | Admitting: Obstetrics & Gynecology

## 2021-07-27 ENCOUNTER — Other Ambulatory Visit: Payer: Self-pay | Admitting: Physician Assistant

## 2021-07-27 DIAGNOSIS — F411 Generalized anxiety disorder: Secondary | ICD-10-CM

## 2021-07-27 DIAGNOSIS — R454 Irritability and anger: Secondary | ICD-10-CM

## 2021-07-30 ENCOUNTER — Ambulatory Visit (HOSPITAL_BASED_OUTPATIENT_CLINIC_OR_DEPARTMENT_OTHER): Payer: PPO | Admitting: Anesthesiology

## 2021-07-30 ENCOUNTER — Ambulatory Visit (HOSPITAL_BASED_OUTPATIENT_CLINIC_OR_DEPARTMENT_OTHER)
Admission: RE | Admit: 2021-07-30 | Discharge: 2021-07-30 | Disposition: A | Discharge status: Home / Self Care | Payer: PPO | Source: Ambulatory Visit | Attending: Obstetrics & Gynecology | Admitting: Obstetrics & Gynecology

## 2021-07-30 ENCOUNTER — Encounter (HOSPITAL_BASED_OUTPATIENT_CLINIC_OR_DEPARTMENT_OTHER): Payer: Self-pay | Admitting: Obstetrics & Gynecology

## 2021-07-30 ENCOUNTER — Encounter (HOSPITAL_BASED_OUTPATIENT_CLINIC_OR_DEPARTMENT_OTHER)
Admission: RE | Disposition: A | Discharge status: Home / Self Care | Payer: Self-pay | Source: Ambulatory Visit | Attending: Obstetrics & Gynecology

## 2021-07-30 DIAGNOSIS — N905 Atrophy of vulva: Secondary | ICD-10-CM | POA: Diagnosis not present

## 2021-07-30 DIAGNOSIS — G4730 Sleep apnea, unspecified: Secondary | ICD-10-CM | POA: Diagnosis not present

## 2021-07-30 DIAGNOSIS — E119 Type 2 diabetes mellitus without complications: Secondary | ICD-10-CM | POA: Insufficient documentation

## 2021-07-30 DIAGNOSIS — N132 Hydronephrosis with renal and ureteral calculous obstruction: Secondary | ICD-10-CM | POA: Diagnosis not present

## 2021-07-30 DIAGNOSIS — I341 Nonrheumatic mitral (valve) prolapse: Secondary | ICD-10-CM | POA: Insufficient documentation

## 2021-07-30 DIAGNOSIS — Z79899 Other long term (current) drug therapy: Secondary | ICD-10-CM | POA: Insufficient documentation

## 2021-07-30 DIAGNOSIS — Z833 Family history of diabetes mellitus: Secondary | ICD-10-CM | POA: Insufficient documentation

## 2021-07-30 DIAGNOSIS — Z882 Allergy status to sulfonamides status: Secondary | ICD-10-CM | POA: Diagnosis not present

## 2021-07-30 DIAGNOSIS — Z90710 Acquired absence of both cervix and uterus: Secondary | ICD-10-CM | POA: Insufficient documentation

## 2021-07-30 DIAGNOSIS — Z880 Allergy status to penicillin: Secondary | ICD-10-CM | POA: Diagnosis not present

## 2021-07-30 DIAGNOSIS — N393 Stress incontinence (female) (male): Secondary | ICD-10-CM | POA: Diagnosis not present

## 2021-07-30 DIAGNOSIS — R569 Unspecified convulsions: Secondary | ICD-10-CM | POA: Insufficient documentation

## 2021-07-30 DIAGNOSIS — Z888 Allergy status to other drugs, medicaments and biological substances status: Secondary | ICD-10-CM | POA: Diagnosis not present

## 2021-07-30 DIAGNOSIS — I10 Essential (primary) hypertension: Secondary | ICD-10-CM | POA: Insufficient documentation

## 2021-07-30 DIAGNOSIS — Z886 Allergy status to analgesic agent status: Secondary | ICD-10-CM | POA: Insufficient documentation

## 2021-07-30 DIAGNOSIS — Z8249 Family history of ischemic heart disease and other diseases of the circulatory system: Secondary | ICD-10-CM | POA: Diagnosis not present

## 2021-07-30 DIAGNOSIS — N8110 Cystocele, unspecified: Secondary | ICD-10-CM | POA: Diagnosis not present

## 2021-07-30 HISTORY — PX: BLADDER SUSPENSION: SHX72

## 2021-07-30 HISTORY — DX: COVID-19: U07.1

## 2021-07-30 HISTORY — DX: Presence of spectacles and contact lenses: Z97.3

## 2021-07-30 HISTORY — PX: CYSTOSCOPY: SHX5120

## 2021-07-30 LAB — POCT I-STAT, CHEM 8
BUN: 11 mg/dL (ref 6–20)
Calcium, Ion: 1.08 mmol/L — ABNORMAL LOW (ref 1.15–1.40)
Chloride: 101 mmol/L (ref 98–111)
Creatinine, Ser: 0.4 mg/dL — ABNORMAL LOW (ref 0.44–1.00)
Glucose, Bld: 172 mg/dL — ABNORMAL HIGH (ref 70–99)
HCT: 42 % (ref 36.0–46.0)
Hemoglobin: 14.3 g/dL (ref 12.0–15.0)
Potassium: 4 mmol/L (ref 3.5–5.1)
Sodium: 136 mmol/L (ref 135–145)
TCO2: 23 mmol/L (ref 22–32)

## 2021-07-30 LAB — TYPE AND SCREEN
ABO/RH(D): A POS
Antibody Screen: NEGATIVE

## 2021-07-30 LAB — GLUCOSE, CAPILLARY: Glucose-Capillary: 182 mg/dL — ABNORMAL HIGH (ref 70–99)

## 2021-07-30 SURGERY — TRANSVAGINAL TAPE (TVT) PROCEDURE
Anesthesia: General

## 2021-07-30 MED ORDER — LACTATED RINGERS IV SOLN: INTRAVENOUS | Status: DC

## 2021-07-30 MED ORDER — KETOROLAC TROMETHAMINE 30 MG/ML IJ SOLN
INTRAMUSCULAR | Status: DC | PRN
  Administered 2021-07-30: 30 mg via INTRAVENOUS

## 2021-07-30 MED ORDER — SCOPOLAMINE 1 MG/3DAYS TD PT72: 1.0000 | MEDICATED_PATCH | TRANSDERMAL | Status: DC

## 2021-07-30 MED ORDER — DEXMEDETOMIDINE (PRECEDEX) IN NS 20 MCG/5ML (4 MCG/ML) IV SYRINGE
PREFILLED_SYRINGE | INTRAVENOUS | Status: DC | PRN
  Administered 2021-07-30: 20 ug via INTRAVENOUS

## 2021-07-30 MED ORDER — BUPIVACAINE HCL 0.5 % IJ SOLN
INTRAMUSCULAR | Status: DC | PRN
  Administered 2021-07-30: 50 mL

## 2021-07-30 MED ORDER — KETOROLAC TROMETHAMINE 30 MG/ML IJ SOLN
INTRAMUSCULAR | Status: AC
  Filled 2021-07-30: qty 1

## 2021-07-30 MED ORDER — ACETAMINOPHEN 10 MG/ML IV SOLN: 1000.0000 mg | Freq: Once | INTRAVENOUS | Status: DC | PRN

## 2021-07-30 MED ORDER — DEXMEDETOMIDINE (PRECEDEX) IN NS 20 MCG/5ML (4 MCG/ML) IV SYRINGE
PREFILLED_SYRINGE | INTRAVENOUS | Status: AC
  Filled 2021-07-30: qty 5

## 2021-07-30 MED ORDER — FENTANYL CITRATE (PF) 100 MCG/2ML IJ SOLN
INTRAMUSCULAR | Status: DC | PRN
  Administered 2021-07-30 (×2): 50 ug via INTRAVENOUS

## 2021-07-30 MED ORDER — SODIUM CHLORIDE 0.9 % IR SOLN
Status: DC | PRN
  Administered 2021-07-30: 1000 mL

## 2021-07-30 MED ORDER — FENTANYL CITRATE (PF) 100 MCG/2ML IJ SOLN
INTRAMUSCULAR | Status: AC
  Filled 2021-07-30: qty 2

## 2021-07-30 MED ORDER — LIDOCAINE-EPINEPHRINE 1 %-1:100000 IJ SOLN
INTRAMUSCULAR | Status: DC | PRN
  Administered 2021-07-30: 20 mL

## 2021-07-30 MED ORDER — PROPOFOL 10 MG/ML IV BOLUS
INTRAVENOUS | Status: AC
  Filled 2021-07-30: qty 20

## 2021-07-30 MED ORDER — CLINDAMYCIN PHOSPHATE 900 MG/50ML IV SOLN
900.0000 mg | INTRAVENOUS | Status: AC
  Administered 2021-07-30: 900 mg via INTRAVENOUS

## 2021-07-30 MED ORDER — SUCCINYLCHOLINE CHLORIDE 200 MG/10ML IV SOSY
PREFILLED_SYRINGE | INTRAVENOUS | Status: DC | PRN
  Administered 2021-07-30: 140 mg via INTRAVENOUS

## 2021-07-30 MED ORDER — MIDAZOLAM HCL 5 MG/5ML IJ SOLN
INTRAMUSCULAR | Status: DC | PRN
  Administered 2021-07-30: 2 mg via INTRAVENOUS

## 2021-07-30 MED ORDER — CLINDAMYCIN PHOSPHATE 900 MG/50ML IV SOLN
INTRAVENOUS | Status: AC
  Filled 2021-07-30: qty 50

## 2021-07-30 MED ORDER — PROMETHAZINE HCL 25 MG/ML IJ SOLN: 6.2500 mg | INTRAMUSCULAR | Status: DC | PRN

## 2021-07-30 MED ORDER — ONDANSETRON HCL 4 MG/2ML IJ SOLN
INTRAMUSCULAR | Status: AC
  Filled 2021-07-30: qty 2

## 2021-07-30 MED ORDER — DEXAMETHASONE SODIUM PHOSPHATE 10 MG/ML IJ SOLN
INTRAMUSCULAR | Status: DC | PRN
  Administered 2021-07-30: 10 mg via INTRAVENOUS

## 2021-07-30 MED ORDER — OXYCODONE-ACETAMINOPHEN 5-325 MG PO TABS: ORAL_TABLET | ORAL | 0 refills | Status: DC

## 2021-07-30 MED ORDER — POVIDONE-IODINE 10 % EX SWAB: 2.0000 "application " | Freq: Once | CUTANEOUS | Status: DC

## 2021-07-30 MED ORDER — ONDANSETRON HCL 4 MG/2ML IJ SOLN
INTRAMUSCULAR | Status: DC | PRN
  Administered 2021-07-30: 4 mg via INTRAVENOUS

## 2021-07-30 MED ORDER — SUCCINYLCHOLINE CHLORIDE 200 MG/10ML IV SOSY
PREFILLED_SYRINGE | INTRAVENOUS | Status: AC
  Filled 2021-07-30: qty 10

## 2021-07-30 MED ORDER — FENTANYL CITRATE (PF) 100 MCG/2ML IJ SOLN: 25.0000 ug | INTRAMUSCULAR | Status: DC | PRN

## 2021-07-30 MED ORDER — LIDOCAINE 2% (20 MG/ML) 5 ML SYRINGE
INTRAMUSCULAR | Status: DC | PRN
  Administered 2021-07-30: 60 mg via INTRAVENOUS

## 2021-07-30 MED ORDER — ESTRADIOL 0.1 MG/GM VA CREA
TOPICAL_CREAM | VAGINAL | Status: DC | PRN
  Administered 2021-07-30: 1 via VAGINAL

## 2021-07-30 MED ORDER — DEXAMETHASONE SODIUM PHOSPHATE 10 MG/ML IJ SOLN
INTRAMUSCULAR | Status: AC
  Filled 2021-07-30: qty 1

## 2021-07-30 MED ORDER — MIDAZOLAM HCL 2 MG/2ML IJ SOLN
INTRAMUSCULAR | Status: AC
  Filled 2021-07-30: qty 2

## 2021-07-30 MED ORDER — NITROFURANTOIN MONOHYD MACRO 100 MG PO CAPS: ORAL_CAPSULE | ORAL | 0 refills | Status: DC

## 2021-07-30 MED ORDER — LIDOCAINE HCL (PF) 2 % IJ SOLN
INTRAMUSCULAR | Status: AC
  Filled 2021-07-30: qty 5

## 2021-07-30 MED ORDER — PROPOFOL 10 MG/ML IV BOLUS
INTRAVENOUS | Status: DC | PRN
  Administered 2021-07-30: 180 mg via INTRAVENOUS
  Administered 2021-07-30 (×2): 100 mg via INTRAVENOUS
  Administered 2021-07-30: 120 mg via INTRAVENOUS

## 2021-07-30 MED ORDER — GENTAMICIN SULFATE 40 MG/ML IJ SOLN
5.0000 mg/kg | INTRAVENOUS | Status: AC
  Administered 2021-07-30: 390 mg via INTRAVENOUS
  Filled 2021-07-30: qty 9.75

## 2021-07-30 MED ORDER — BETHANECHOL CHLORIDE 10 MG PO TABS: ORAL_TABLET | ORAL | 0 refills | Status: DC

## 2021-07-30 SURGICAL SUPPLY — 29 items
BLADE SURG 11 STRL SS (BLADE) ×2 IMPLANT
BLADE SURG 15 STRL LF DISP TIS (BLADE) ×1 IMPLANT
BLADE SURG 15 STRL SS (BLADE) ×2
CATH FOLEY 2WAY SLVR 18FR 30CC (CATHETERS) ×2 IMPLANT
DECANTER SPIKE VIAL GLASS SM (MISCELLANEOUS) ×2 IMPLANT
DERMABOND ADVANCED (GAUZE/BANDAGES/DRESSINGS) ×1
DERMABOND ADVANCED .7 DNX12 (GAUZE/BANDAGES/DRESSINGS) ×1 IMPLANT
GAUZE 4X4 16PLY ~~LOC~~+RFID DBL (SPONGE) ×2 IMPLANT
GAUZE PACKING 2X5 YD STRL (GAUZE/BANDAGES/DRESSINGS) ×2 IMPLANT
GLOVE SURG ENC MOIS LTX SZ6.5 (GLOVE) ×2 IMPLANT
GLOVE SURG UNDER POLY LF SZ7 (GLOVE) ×4 IMPLANT
GOWN STRL REUS W/TWL LRG LVL3 (GOWN DISPOSABLE) ×6 IMPLANT
KIT TURNOVER CYSTO (KITS) ×2 IMPLANT
MANIFOLD NEPTUNE II (INSTRUMENTS) ×2 IMPLANT
NEEDLE SPNL 22GX3.5 QUINCKE BK (NEEDLE) ×2 IMPLANT
NS IRRIG 1000ML POUR BTL (IV SOLUTION) ×2 IMPLANT
PACK VAGINAL WOMENS (CUSTOM PROCEDURE TRAY) ×2 IMPLANT
SET IRRIG Y TYPE TUR BLADDER L (SET/KITS/TRAYS/PACK) ×2 IMPLANT
SLING TVT EXACT (Sling) ×2 IMPLANT
SPONGE SURGIFOAM ABS GEL 12-7 (HEMOSTASIS) ×2 IMPLANT
SUCTION FRAZIER HANDLE 10FR (MISCELLANEOUS) ×2
SUCTION TUBE FRAZIER 10FR DISP (MISCELLANEOUS) ×1 IMPLANT
SUT MNCRL AB 3-0 PS2 27 (SUTURE) ×2 IMPLANT
SUT MON AB 2-0 SH 27 (SUTURE) ×2
SUT MON AB 2-0 SH27 (SUTURE) ×1 IMPLANT
SUT VIC AB 0 CT1 18XCR BRD8 (SUTURE) ×1 IMPLANT
SUT VIC AB 0 CT1 8-18 (SUTURE) ×2
TOWEL OR 17X26 10 PK STRL BLUE (TOWEL DISPOSABLE) ×2 IMPLANT
TRAY FOLEY W/BAG SLVR 14FR LF (SET/KITS/TRAYS/PACK) ×2 IMPLANT

## 2021-07-30 NOTE — Op Note (Signed)
OPERATIVE NOTE  Samantha [name]  DOB:    1981/07/31  MRN:    [id number]  CSN:    [id number]  Date of Surgery:  07/30/2021 Preoperative Diagnosis: Urinary Stress Incontinence  Postoperative Diagnosis: same  Procedure: TENSION FREE TRANSVAGINAL TAPE (TVT) PROCEDURE, CYSTOSCOPY  Surgeon: Mady Haagensen. Cashton Hosley MD  Assistant: Earnstine Regal, [facility]  Anesthesia: Started as LMA then mid-case patient was intubated due to bronchospasm and O2 desaturation.   Estimated Blood Loss:  75 mL         Drains: Foley during case         Total IV Fluids: 159 ml  Blood Given: none          Specimens: None         Implants: Gynecare TVT        Complications:  None         Disposition: PACU - hemodynamically stable.         Condition: stable    Disposition: The patient presents with the above-mentioned diagnosis. She understands the indications for surgical procedure.  She also understands the alternative treatment options. She accepts the risk of, but not limited to, anesthetic complications, bleeding, infections, and possible damage to the surrounding organs.  Operative Findings: Vaginal cuff intact with evidence of a Grade 3 cystocele;External vulva Atrophic and introitus narrowed. Vagina: pale, poor rugae and very friable and thin. Cystoscopy: Normal bladder urothelium, normal urethra, no injury. Brisk ureteral jets bilaterally.   Procedure: Place patient in dorsal lithotomy avoiding hip flexion greater than 60. The 45 French Foley catheter was already in place. The exit points were marked 2 cm on each side of the midline immediately above the pubic symphysis. 20 cc of diluted 0.25 % Marcaine  local anesthesia was injected using a spinal needle into the exit sites and passing the needle along the back of the pubic symphysis until the needle touched the endopelvic fascia.   1% lidocaine with epinephrine was injected submucosally at the level of the mid urethra, creating a space between the vaginal  wall and the periurethral fascia. A sagittal incision no more than 1.0 cm long starting at 1.0 cm cephalad from the urethral meatus was performed using a 15 blade scalpel. Two 0.5 cm paraurethral lateral dissections were performed using Metzenbaum scissors to allow the subsequent passage of the implant.   The bladder was confirmed to be empty and the GYNECARE TVT reusable rigid catheter guide was inserted into the channel of the 18 French Foley catheter. The tip of foley catheter was pushed gently toward the posterior lateral wall of the bladder opposite to the intended trocar sheath passage in order to displace the bladder to the contralateral side and prevent injury.  The trocar sheath was secured to the Trocar Handle by hooking the trocar Sheath Cut-out onto the trocar sheath Lock on the Trocar Handle.  The trocar shaft was placed inside one of the two white trocar sheaths, using the dominant hand to hold the trocar handle and the  non-dominant hand to control initial insertion of the device by placing index finger under the anterior vaginal wall, just lateral of the suburethral incision. The trocar sheath tip was oriented horizontally in the frontal plane during the initial submucosal passage in the paraurethral dissected space and the tip of the trocar sheath passed through until it reached the end of the dissected space.  The urogenital diaphragm was perforated and the trocar handle lowered to ensure that the trocar sheath tip passed  vertically, staying in close contact with the back of the pubic symphysis. The trocar sheath tip was advance through the urogenital diaphragm into the retropubic space and aimed towards the pre-marked  abdominal exit sites.  The tip was advanced through the retropubic space, the rectus muscle and skin and then grasped with a Kelly clamp. The trocar Sheath was pushed laterally off the lock and the needle / shaft was withdrawn. The 18 french foley catheter was removed and a  cystoscopy was performed to confirm bladder integrity.   The above procedure was repeated on the contralateral side. The trocar Sheaths were gently pulled upwards to bring the sling loosely under the mid urethra. The sling was carefully positioned into place. Care was taken to insure that the sling was not twisted in any way, and the sheaths were removed.  Mayo scissors were placed between the urethra and the sling itself during tensioning of the sling to insure proper placement and that it was in fact tension free. The sling was then cut to the level of the skin, the skin edges were lifted up to ensure that mesh was subdermal and the skin was closed using Dermabond.  The vaginal mucosa was then closed using 3-0 Monocryl on a subcuticular stitch and hemostasis was achieved.  The vagina was packed with Estrace soaked 1/2 inch packing tape (to be removed in PACU prior to discharge.   The patient was awakened from general anesthesia and taken to the PACU in satisfactory condition having tolerated the procedure well with sponge and instrument counts correct.  Voiding trials will be done prior to discharge. It was anticipated that she would be discharged home later that afternoon.  Rogelio Seen Elliot Simoneaux

## 2021-07-30 NOTE — Transfer of Care (Signed)
Immediate Anesthesia Transfer of Care Note  Patient: Samantha [name]  Procedure(s) Performed: TRANSVAGINAL TAPE (TVT) PROCEDURE CYSTOSCOPY  Patient Location: PACU  Anesthesia Type:General  Level of Consciousness: drowsy  Airway & Oxygen Therapy: Patient Spontanous Breathing and Patient connected to nasal cannula oxygen  Post-op Assessment: Report given to RN  Post vital signs: Reviewed and stable  Last Vitals:  Vitals Value Taken Time  BP 119/80 07/30/21 1021  Temp    Pulse 84 07/30/21 1023  Resp 21 07/30/21 1023  SpO2 97 % 07/30/21 1023  Vitals shown include unvalidated device data.  Last Pain:  Vitals:   07/30/21 0733  PainSc: 0-No pain      Patients Stated Pain Goal: 6 ([id number])  Complications: No notable events documented.

## 2021-07-30 NOTE — Anesthesia Postprocedure Evaluation (Signed)
Anesthesia Post Note  Patient: Samantha [name]  Procedure(s) Performed: TRANSVAGINAL TAPE (TVT) PROCEDURE CYSTOSCOPY     Patient location during evaluation: PACU Anesthesia Type: General Level of consciousness: awake and alert Pain management: pain level controlled Vital Signs Assessment: post-procedure vital signs reviewed and stable Respiratory status: spontaneous breathing, nonlabored ventilation, respiratory function stable and patient connected to nasal cannula oxygen Cardiovascular status: blood pressure returned to baseline and stable Postop Assessment: no apparent nausea or vomiting Anesthetic complications: no   No notable events documented.  Last Vitals:  Vitals:   07/30/21 1345 07/30/21 1349  BP: 127/73   Pulse: 81   Resp: 16   Temp: 36.6 C 36.6 C  SpO2: 96%     Last Pain:  Vitals:   07/30/21 1345  TempSrc: Oral  PainSc:                  March Rummage Margrette Wynia

## 2021-07-30 NOTE — Anesthesia Procedure Notes (Signed)
Procedure Name: Intubation Date/Time: 07/30/2021 9:30 AM Performed by: Bonney Aid, CRNA Pre-anesthesia Checklist: Patient identified, Emergency Drugs available, Suction available and Patient being monitored Patient Re-evaluated:Patient Re-evaluated prior to induction Oxygen Delivery Method: Circle system utilized Preoxygenation: Pre-oxygenation with 100% oxygen Induction Type: IV induction Laryngoscope Size: Mac and 3 Grade View: Grade II Tube type: Oral Tube size: 7.0 mm Number of attempts: 1 Airway Equipment and Method: Stylet Placement Confirmation: ETT inserted through vocal cords under direct vision, positive ETCO2 and breath sounds checked- equal and bilateral Secured at: 21 cm Tube secured with: Tape Dental Injury: Teeth and Oropharynx as per pre-operative assessment  Comments: Lma removed and intubated easily per Dr Rex Kras

## 2021-07-30 NOTE — Anesthesia Preprocedure Evaluation (Signed)
Anesthesia Evaluation  Patient identified by MRN, date of birth, ID band Patient awake    Reviewed: Allergy & Precautions, NPO status , Patient's Chart, lab work & pertinent test results  Airway Mallampati: II  TM Distance: >3 FB Neck ROM: Full    Dental  (+) Teeth Intact   Pulmonary sleep apnea (doesn't use CPAP) ,    Pulmonary exam normal        Cardiovascular hypertension, Pt. on medications and Pt. on home beta blockers  Rhythm:Regular Rate:Normal     Neuro/Psych  Headaches, Seizures - (2015 last episode), Well Controlled,  Anxiety Depression Bipolar Disorder    GI/Hepatic Neg liver ROS, GERD  ,  Endo/Other  diabetes, Well Controlled, Type 2, Insulin Dependent, Oral Hypoglycemic Agents  Renal/GU  Bladder dysfunction Female GU complaint (stress incontinence)     Musculoskeletal  (+) Arthritis , Osteoarthritis,  Fibromyalgia -  Abdominal (+)  Abdomen: soft. Bowel sounds: normal.  Peds  Hematology  (+) anemia ,   Anesthesia Other Findings   Reproductive/Obstetrics                             Anesthesia Physical Anesthesia Plan  ASA: 3  Anesthesia Plan: General   Post-op Pain Management:    Induction: Intravenous  PONV Risk Score and Plan: 3 and Ondansetron, Dexamethasone, Midazolam and Treatment may vary due to age or medical condition  Airway Management Planned: Mask and LMA  Additional Equipment: None  Intra-op Plan:   Post-operative Plan: Extubation in OR  Informed Consent: I have reviewed the patients History and Physical, chart, labs and discussed the procedure including the risks, benefits and alternatives for the proposed anesthesia with the patient or authorized representative who has indicated his/her understanding and acceptance.     Dental advisory given  Plan Discussed with: CRNA  Anesthesia Plan Comments: (Lab Results      Component                Value                Date                      NA                       137                 07/26/2021                K                        4.0                 07/26/2021                CO2                      26                  07/26/2021                GLUCOSE                  184 (H)             07/26/2021  BUN                      15                  07/26/2021                CREATININE               0.69                07/26/2021                CALCIUM                  9.0                 07/26/2021                GFRNONAA                 >60                 07/26/2021                GFRAA                    120                 08/12/2020           Lab Results      Component                Value               Date                      WBC                      8.3                 07/26/2021                HGB                      13.2                07/26/2021                HCT                      40.9                07/26/2021                MCV                      86.3                07/26/2021                PLT                      189                 07/26/2021           ECHO 06/21: 1. Left ventricular ejection fraction, by estimation, is 55 to 60%. The  left ventricle has normal function. The  left ventricle has no regional  wall motion abnormalities. Left ventricular diastolic parameters were  normal.  2. Right ventricular systolic function is normal. The right ventricular  size is normal.  3. The mitral valve is normal in structure. Trivial mitral valve  regurgitation.  4. The aortic valve is tricuspid. Aortic valve regurgitation is mild.  5. Aortic dilatation noted. There is mild dilatation of the aortic root  measuring 41 mm. )        Anesthesia Quick Evaluation

## 2021-07-30 NOTE — Discharge Instructions (Addendum)
Call [facility] @ [phone] if:  You have a temperature greater than or equal to 100.4 degrees Farenheit orally You have pain that is not made better by the pain medication given and taken as directed You have excessive bleeding or problems urinating  Take Colace (Docusate Sodium/Stool Softener) 100 mg 2-3 times daily while taking narcotic pain medicine to avoid constipation or until bowel movements are regular.  Discontinue Hyoscyamine 0.125 mg SL for 1 week  (or while you are taking the Bethanechol Chloride 10 mg tablet).  You may resume once you have stopped taking the Bethanechol Chloride. You have been given Estradiol Vaginal Cream at discharge.from the hospital.  Wait until instructions by Dr Alwyn Pea as to whether to use and how to use  You may drive after 2 weeks You may walk up steps  You may shower tomorrow You may resume a regular diet  Do not lift over 15 pounds for 6 weeks Avoid anything in vagina for 6 weeks  Post Anesthesia Home Care Instructions  Activity: Get plenty of rest for the remainder of the day. A responsible adult should stay with you for 24 hours following the procedure.  For the next 24 hours, DO NOT: -Drive a car -Paediatric nurse -Drink alcoholic beverages -Take any medication unless instructed by your physician -Make any legal decisions or sign important papers.  Meals: Start with liquid foods such as gelatin or soup. Progress to regular foods as tolerated. Avoid greasy, spicy, heavy foods. If nausea and/or vomiting occur, drink only clear liquids until the nausea and/or vomiting subsides. Call your physician if vomiting continues.  Special Instructions/Symptoms: Your throat may feel dry or sore from the anesthesia or the breathing tube placed in your throat during surgery. If this causes discomfort, gargle with warm salt water. The discomfort should disappear within 24 hours.  If you had a scopolamine patch placed behind your ear for  the management of post- operative nausea and/or vomiting:  1. The medication in the patch is effective for 72 hours, after which it should be removed.  Wrap patch in a tissue and discard in the trash. Wash hands thoroughly with soap and water. 2. You may remove the patch earlier than 72 hours if you experience unpleasant side effects which may include dry mouth, dizziness or visual disturbances. 3. Avoid touching the patch. Wash your hands with soap and water after contact with the patch.

## 2021-07-30 NOTE — Anesthesia Procedure Notes (Signed)
Procedure Name: LMA Insertion Date/Time: 07/30/2021 8:51 AM Performed by: Rogers Blocker, CRNA Pre-anesthesia Checklist: Patient identified, Emergency Drugs available, Suction available and Patient being monitored Patient Re-evaluated:Patient Re-evaluated prior to induction Oxygen Delivery Method: Circle system utilized Preoxygenation: Pre-oxygenation with 100% oxygen Induction Type: IV induction Ventilation: Mask ventilation without difficulty LMA: LMA inserted LMA Size: 5.0 Number of attempts: 1 Airway Equipment and Method: Bite block Placement Confirmation: positive ETCO2 Tube secured with: Tape Dental Injury: Teeth and Oropharynx as per pre-operative assessment

## 2021-07-30 NOTE — OR Nursing (Signed)
I pickup up pt in phase 2 assisted to restroom pt voided approx 250cc urine. Bladder scanned for 263-289.  Pt iv fluids were at kvo pt was not drinking approx 12-35mnutes later assisted to restroom voided 200 plus.  Voided again approx 150 missed the hat.  Scanned for 263cc.  Pt had already voided 2x prior to coming to phase 2.  Dr.Pinn notified. Instructed to cahterize and notify of findings.  Pt voided 350cc prior to catherizing and and voided again prior to cath. Cathed for 135 initially then approx 7 minutes later pt emptied another 325cc for a total of 460cc clear light yellow urine.  DR. PAlwyn Peanotified. Orders received to send pt home with foley cathetor.  TDarin EngelsRn.

## 2021-08-02 ENCOUNTER — Encounter (HOSPITAL_BASED_OUTPATIENT_CLINIC_OR_DEPARTMENT_OTHER): Payer: Self-pay | Admitting: Obstetrics & Gynecology

## 2021-08-03 DIAGNOSIS — R338 Other retention of urine: Secondary | ICD-10-CM | POA: Diagnosis not present

## 2021-08-20 ENCOUNTER — Ambulatory Visit (INDEPENDENT_AMBULATORY_CARE_PROVIDER_SITE_OTHER): Payer: PPO

## 2021-08-20 ENCOUNTER — Other Ambulatory Visit: Payer: Self-pay

## 2021-08-20 DIAGNOSIS — I77810 Thoracic aortic ectasia: Secondary | ICD-10-CM | POA: Diagnosis not present

## 2021-08-20 DIAGNOSIS — R600 Localized edema: Secondary | ICD-10-CM

## 2021-08-20 LAB — ECHOCARDIOGRAM COMPLETE
AR max vel: 2.97 cm2
AV Area VTI: 2.72 cm2
AV Area mean vel: 2.6 cm2
AV Mean grad: 5 mmHg
AV Peak grad: 9.4 mmHg
AV Vena cont: 0.2 cm
Ao pk vel: 1.53 m/s
Area-P 1/2: 5.13 cm2
Calc EF: 51.5 %
P 1/2 time: 413 msec
S' Lateral: 3.7 cm
Single Plane A2C EF: 56.9 %
Single Plane A4C EF: 48.6 %

## 2021-09-01 ENCOUNTER — Telehealth: Payer: Self-pay | Admitting: *Deleted

## 2021-09-01 DIAGNOSIS — I712 Thoracic aortic aneurysm, without rupture, unspecified: Secondary | ICD-10-CM

## 2021-09-01 NOTE — Telephone Encounter (Signed)
-----   Message from Solmon Ice, RN sent at 08/23/2021  2:32 PM EDT -----  ----- Message ----- From: Rise Mu, PA-C Sent: 08/20/2021   4:40 PM EDT To: Rebec[facility] Alert [facility] Triage  Echo showed normal pump function, normal wall motion, slightly stiffened heart, mildly dilated upper left chamber of the heart, and an overall stable mild dilatation of the aortic root measuring 42 mm and mild dilatation of the ascending aorta measuring 40 mm.  Recommendation: -If she is agreeable, schedule CTA of the aorta to further characterize her dilated aortic root and ascending aorta, and to establish a baseline for periodic evaluation moving forward

## 2021-09-01 NOTE — Telephone Encounter (Signed)
Spoke with patient and reviewed her results and recommendations. Order placed for the CTA and provided her with number for scheduling at [phone]. Answered her questions and advised if she should have other questions to please call back and we will be able to assist. She verbalized understanding of our questions with no further questions.

## 2021-09-22 NOTE — Telephone Encounter (Signed)
Spoke with patient to follow up on her CTA to get a baseline of her aorta. Reviewed again what the test looks at and how this is so we can monitor for any future changes. She was agreeable to have this done and provided them with the number [phone] to call for scheduling that test. She verbalized understanding of our conversation with no further questions at this time.

## 2021-10-05 NOTE — Telephone Encounter (Signed)
Spoke with patient and inquired if she was able to schedule her test yet. She states that it would be better for her to have it done in December. Reviewed that would be fine and provided her with number to call for scheduling. She was appreciative for the call back with no further questions at this time.

## 2021-10-13 ENCOUNTER — Other Ambulatory Visit: Payer: Self-pay | Admitting: Physician Assistant

## 2021-10-20 ENCOUNTER — Ambulatory Visit: Payer: PPO | Admitting: Internal Medicine

## 2021-10-20 NOTE — Progress Notes (Deleted)
Follow-up Outpatient Visit Date: 10/20/2021  Primary Care Provider: Lorrene Reid, PA-C [place] [id number]  Chief Complaint: ***  HPI:  Samantha [name] is a 40 y.o. female with history of dilated aortic root, atypical chest pain, hypertension, hyperlipidemia, type 2 diabetes mellitus, obstructive sleep apnea, GERD, hepatic steatosis, and fibromyalgia, who presents for follow-up of chest pain and dilated aortic root.  She was last seen in our office by Christell Faith, PA, in August for preoperative evaluation in anticipation of transvaginal tape and cystoscopy.  She had previously been referred for CTA of the chest to further assess her dilated aortic root but was a no-show for the exam.  She wished to repeat an echocardiogram follow-up echocardiogram in September showed persistent mild enlargement of the ascending aorta and aortic root (up to 4.2 cm).  She was again referred for CTA of the chest, which has yet to be done.  --------------------------------------------------------------------------------------------------  Past Medical History:  Diagnosis Date   Anemia    Angio-edema    Anxiety    Complication of anesthesia    slow to wake up 07/26/2021   COVID    sept 2021 flu like symptoms and cough.   lasted 5 days. 07/26/2021   Depression    rx presc but not taken   Diabetes mellitus without complication ([place])    Dilated aortic root ([place])    a. 05/2020 Echo: Ao root 13mm.   Fibromyalgia    GERD (gastroesophageal reflux disease)    no meds   H/o Sinus infection    Headache(784.0)    tension and with anxiety hx. complicated migraine 77/41/2878   Hepatic steatosis    History of kidney stones    MVP (mitral valve prolapse)    a. Pt told MVP present as child; b. 05/2020 Echo: EF 55-60%, no rwma, nl RV size/fxn, triv MR, mild AI. Ao root 92mm.   Pneumonia    hx   Seizures ([place])    a. non epileptic events per epilepsy monitoring unit; b. last seizure July  2015- does not convulse but has a sleep effect. pt states she doesnt have seizures, she has complicated migraines and causes her to have sleep like effective per pt. pt goes to [facility] and use to see Dr. Hillis Range but she left the practiced and doesnt have new dr. 07/27/2021   Sleep apnea    has cpap does not use   Wears glasses    07/26/2021   Past Surgical History:  Procedure Laterality Date   ABDOMINAL HYSTERECTOMY N/A 08/15/2014   Procedure: HYSTERECTOMY ABDOMINAL WITH CYSTOSCOPY;  Surgeon: Sanjuana Kava, MD;  Location: [facility];  Service: Gynecology;  Laterality: N/A;   BLADDER SUSPENSION N/A 07/30/2021   Procedure: TRANSVAGINAL TAPE (TVT) PROCEDURE;  Surgeon: Sanjuana Kava, MD;  Location: [facility];  Service: Gynecology;  Laterality: N/A;   CESAREAN SECTION N/A    2010 07/26/2021   CYSTOSCOPY N/A 07/30/2021   Procedure: CYSTOSCOPY;  Surgeon: Sanjuana Kava, MD;  Location: [facility];  Service: Gynecology;  Laterality: N/A;   CYSTOSCOPY/RETROGRADE/URETEROSCOPY Left 05/23/2019   Procedure: CYSTOSCOPY/RETROGRADE/URETEROSCOPY;  Surgeon: Abbie Sons, MD;  Location: [facility];  Service: Urology;  Laterality: Left;   KNEE ARTHROSCOPY     yr 2020 07/26/2021   SHOULDER ARTHROSCOPY WITH SUBACROMIAL DECOMPRESSION AND OPEN ROTATOR C Left 11/27/2015   Procedure: LEFT SHOULDER ARTHROSCOPY WITH SUBACROMIAL DECOMPRESSION, MINI OPEN ROTATOR CUFF REPAIR;  Surgeon: Netta Cedars, MD;  Location:  [facility];  Service: Orthopedics;  Laterality: Left;   TUBAL LIGATION      No outpatient medications have been marked as taking for the 10/20/21 encounter (Appointment) with Samantha [name], Samantha Gave, MD.    Allergies: Sulfa antibiotics, Topiramate, Duloxetine hcl, Ibuprofen, Metformin and related, and Penicillins  Social History   Tobacco Use   Smoking status: Never   Smokeless tobacco: Never  Vaping Use   Vaping Use: Never used  Substance Use Topics   Alcohol use: No   Drug  use: No    Family History  Problem Relation Age of Onset   Diabetes Mother    Hypertension Mother    Healthy Father    Breast cancer Maternal Grandmother    Cancer Maternal Grandfather        brain, lung, liver   Other Paternal Grandmother    Heart attack Paternal Grandfather     Review of Systems: A 12-system review of systems was performed and was negative except as noted in the HPI.  --------------------------------------------------------------------------------------------------  Physical Exam: LMP 08/15/2014 Comment: partial hysterectomy  General:  NAD. Neck: No JVD or HJR. Lungs: Clear to auscultation bilaterally without wheezes or crackles. Heart: Regular rate and rhythm without murmurs, rubs, or gallops. Abdomen: Soft, nontender, nondistended. Extremities: No lower extremity edema.  EKG:  ***  Lab Results  Component Value Date   WBC 8.3 07/26/2021   HGB 14.3 07/30/2021   HCT 42.0 07/30/2021   MCV 86.3 07/26/2021   PLT 189 07/26/2021    Lab Results  Component Value Date   NA 136 07/30/2021   K 4.0 07/30/2021   CL 101 07/30/2021   CO2 26 07/26/2021   BUN 11 07/30/2021   CREATININE 0.40 (L) 07/30/2021   GLUCOSE 172 (H) 07/30/2021   ALT 17 03/10/2021    Lab Results  Component Value Date   CHOL 149 08/12/2020   HDL 54 08/12/2020   LDLCALC 67 08/12/2020   TRIG 169 (H) 08/12/2020   CHOLHDL 2.8 08/12/2020    --------------------------------------------------------------------------------------------------  ASSESSMENT AND PLAN: Samantha [name] Samantha Madewell, MD 10/20/2021 7:25 AM

## 2021-11-02 ENCOUNTER — Ambulatory Visit (INDEPENDENT_AMBULATORY_CARE_PROVIDER_SITE_OTHER): Payer: PPO

## 2021-11-02 ENCOUNTER — Encounter (HOSPITAL_COMMUNITY): Payer: Self-pay

## 2021-11-02 ENCOUNTER — Ambulatory Visit (HOSPITAL_COMMUNITY)
Admission: EM | Admit: 2021-11-02 | Discharge: 2021-11-02 | Disposition: A | Discharge status: Home / Self Care | Payer: PPO | Attending: Family Medicine | Admitting: Family Medicine

## 2021-11-02 DIAGNOSIS — S61200A Unspecified open wound of right index finger without damage to nail, initial encounter: Secondary | ICD-10-CM | POA: Diagnosis not present

## 2021-11-02 DIAGNOSIS — Z23 Encounter for immunization: Secondary | ICD-10-CM

## 2021-11-02 DIAGNOSIS — S61210A Laceration without foreign body of right index finger without damage to nail, initial encounter: Secondary | ICD-10-CM

## 2021-11-02 DIAGNOSIS — S61209A Unspecified open wound of unspecified finger without damage to nail, initial encounter: Secondary | ICD-10-CM

## 2021-11-02 MED ORDER — TETANUS-DIPHTH-ACELL PERTUSSIS 5-2.5-18.5 LF-MCG/0.5 IM SUSY
0.5000 mL | PREFILLED_SYRINGE | Freq: Once | INTRAMUSCULAR | Status: AC
  Administered 2021-11-02: 0.5 mL via INTRAMUSCULAR

## 2021-11-02 MED ORDER — HIBICLENS 4 % EX LIQD: Freq: Every day | CUTANEOUS | 0 refills | Status: DC | PRN

## 2021-11-02 MED ORDER — MUPIROCIN 2 % EX OINT: 1.0000 "application " | TOPICAL_OINTMENT | Freq: Two times a day (BID) | CUTANEOUS | 0 refills | Status: DC

## 2021-11-02 MED ORDER — HYDROCODONE-ACETAMINOPHEN 5-325 MG PO TABS: 1.0000 | ORAL_TABLET | Freq: Four times a day (QID) | ORAL | 0 refills | Status: DC | PRN

## 2021-11-02 MED ORDER — DOXYCYCLINE HYCLATE 100 MG PO CAPS: 100.0000 mg | ORAL_CAPSULE | Freq: Two times a day (BID) | ORAL | 0 refills | Status: DC

## 2021-11-02 NOTE — ED Provider Notes (Addendum)
[place]    CSN: [id number] Arrival date & time: 11/02/21  5732      History   Chief Complaint Chief Complaint  Patient presents with   Laceration    R Index finger    HPI Samantha [name] is a 40 y.o. female.   Patient presenting today with a finger injury to the right index finger that she sustained about 30 minutes prior to arrival via a metal grinding device that she was using.  She states her pain is greater than 10 out of 10 in the area continues to bleed particularly with movement despite pressure dressing.  She denies decreased range of motion, numbness, tingling, damage to the nail.  Has been applying pressure dressing and rinse the area with water prior to arrival.  Last tetanus shot was in 2015 per chart review.   Past Medical History:  Diagnosis Date   Anemia    Angio-edema    Anxiety    Complication of anesthesia    slow to wake up 07/26/2021   COVID    sept 2021 flu like symptoms and cough.   lasted 5 days. 07/26/2021   Depression    rx presc but not taken   Diabetes mellitus without complication ([place])    Dilated aortic root ([place])    a. 05/2020 Echo: Ao root 62mm.   Fibromyalgia    GERD (gastroesophageal reflux disease)    no meds   H/o Sinus infection    Headache(784.0)    tension and with anxiety hx. complicated migraine 20/25/4270   Hepatic steatosis    History of kidney stones    MVP (mitral valve prolapse)    a. Pt told MVP present as child; b. 05/2020 Echo: EF 55-60%, no rwma, nl RV size/fxn, triv MR, mild AI. Ao root 73mm.   Pneumonia    hx   Seizures ([place])    a. non epileptic events per epilepsy monitoring unit; b. last seizure July 2015- does not convulse but has a sleep effect. pt states she doesnt have seizures, she has complicated migraines and causes her to have sleep like effective per pt. pt goes to [facility] and use to see Dr. Hillis Range but she left the practiced and doesnt have new dr. 07/27/2021   Sleep apnea     has cpap does not use   Wears glasses    07/26/2021    Patient Active Problem List   Diagnosis Date Noted   Chest pain 04/08/2020   SOB (shortness of breath) 04/08/2020   Palpitations 04/08/2020   Essential hypertension 06/12/2019   Mood disorder ([place]) 06/12/2019   Ureteral calculus 62/37/6283   Renal colic 15/17/6160   Hydronephrosis with urinary obstruction due to ureteral calculus 05/20/2019   Irritability and anger 03/06/2019   Heartburn 03/04/2019   Chronic rhinitis 03/04/2019   Adverse food reaction 03/04/2019   Globus sensation 02/28/2019   Finger pain, left 01/01/2019   Allergy history unknown- possible throat closure sensation to certain foods 73/71/0626   Neuropathic pain 09/04/2018   Chronic radicular pain of lower back 09/04/2018   Lumbar foraminal stenosis-L5-S1 09/04/2018   Osteoarthritis of spine with radiculopathy, lumbar region 09/04/2018   Obesity, Class I, BMI 30-34.9 09/04/2018   Piriformis syndrome, left 07/12/2018   Single episode of elevated blood pressure 07/12/2018   Hypertriglyceridemia 04/11/2018   Low level of high density lipoprotein (HDL) 04/11/2018   Vitamin D insufficiency 04/11/2018   Epigastric abdominal pain with N 04/06/2018   History  of vitamin D deficiency 04/06/2018   Spondylosis without myelopathy or radiculopathy, lumbar region 08/65/7846   Acute folliculitis- likely due to MRSA, they are draining 06/19/2017   Personal history of MRSA (methicillin resistant Staphylococcus aureus) 06/19/2017   Chronic diarrhea  06/19/2017   Contact dermatitis and eczema- due to tape\ Band-Aid 06/19/2017   Abdominal pain, LUQ 05/02/2017   Abdominal pain, LLQ 05/02/2017   Hx of diarrhea 05/02/2017   Environmental and seasonal allergies 09/29/2016   GERD (gastroesophageal reflux disease) 09/29/2016   Bipolar 1 disorder - Anxiety 09/28/2016   Fibromyalgia 08/11/2016   Type 2 diabetes mellitus with complication, without long-term current use of insulin  ([place]) 12/29/2015   OSA on CPAP 12/08/2014   Obstructive sleep apnea syndrome, moderate 11/24/2014   Fatty liver 09/05/2014   S/P abdominal hysterectomy 08/15/2014   Bladder dysfunction 04/30/2014   Nephrolithiasis 03/04/2014   Agenesis, corpus callosum ([place]) 02/17/2014   GAD (generalized anxiety disorder) 02/17/2014   History of blood transfusion 02/17/2014   Obesity (BMI 30-39.9) 02/17/2014    Past Surgical History:  Procedure Laterality Date   ABDOMINAL HYSTERECTOMY N/A 08/15/2014   Procedure: HYSTERECTOMY ABDOMINAL WITH CYSTOSCOPY;  Surgeon: Sanjuana Kava, MD;  Location: [facility];  Service: Gynecology;  Laterality: N/A;   BLADDER SUSPENSION N/A 07/30/2021   Procedure: TRANSVAGINAL TAPE (TVT) PROCEDURE;  Surgeon: Sanjuana Kava, MD;  Location: [facility];  Service: Gynecology;  Laterality: N/A;   CESAREAN SECTION N/A    2010 07/26/2021   CYSTOSCOPY N/A 07/30/2021   Procedure: CYSTOSCOPY;  Surgeon: Sanjuana Kava, MD;  Location: [facility];  Service: Gynecology;  Laterality: N/A;   CYSTOSCOPY/RETROGRADE/URETEROSCOPY Left 05/23/2019   Procedure: CYSTOSCOPY/RETROGRADE/URETEROSCOPY;  Surgeon: Abbie Sons, MD;  Location: [facility];  Service: Urology;  Laterality: Left;   KNEE ARTHROSCOPY     yr 2020 07/26/2021   SHOULDER ARTHROSCOPY WITH SUBACROMIAL DECOMPRESSION AND OPEN ROTATOR C Left 11/27/2015   Procedure: LEFT SHOULDER ARTHROSCOPY WITH SUBACROMIAL DECOMPRESSION, MINI OPEN ROTATOR CUFF REPAIR;  Surgeon: Netta Cedars, MD;  Location: [facility];  Service: Orthopedics;  Laterality: Left;   TUBAL LIGATION      OB History   No obstetric history on file.      Home Medications    Prior to Admission medications   Medication Sig Start Date End Date Taking? Authorizing Provider  chlorhexidine (HIBICLENS) 4 % external liquid Apply topically daily as needed. 11/02/21  Yes Volney American, PA-C  doxycycline (VIBRAMYCIN) 100 MG capsule Take 1 capsule (100 mg  total) by mouth 2 (two) times daily. 11/02/21  Yes Volney American, PA-C  HYDROcodone-acetaminophen (NORCO/VICODIN) 5-325 MG tablet Take 1 tablet by mouth every 6 (six) hours as needed for moderate pain. 11/02/21  Yes Volney American, PA-C  mupirocin ointment (BACTROBAN) 2 % Apply 1 application topically 2 (two) times daily. 11/02/21  Yes Volney American, PA-C  amitriptyline (ELAVIL) 10 MG tablet Take 50 mg by mouth at bedtime.  07/19/19   [provider]  bethanechol (URECHOLINE) 10 MG tablet take 1 tablet po 3 times a day for 3 days; may take an additional 3 days to help with urination 07/30/21   Earnstine Regal, PA-C  FLUoxetine (PROZAC) 20 MG tablet TAKE 1 TABLET (20 MG TOTAL) BY MOUTH DAILY. 07/27/21   Abonza, Herb Grays, PA-C  liraglutide (VICTOZA) 18 MG/3ML SOPN Inject 1.2 mg into the skin daily.    [provider]  metoprolol succinate (TOPROL-XL) 25 MG 24 hr tablet TAKE 1/2 TABLET  BY MOUTH DAILY 07/27/21   End, Harrell Gave, MD  nitrofurantoin, macrocrystal-monohydrate, (MACROBID) 100 MG capsule take 1 capsule po pc bid x 7 days 07/30/21   Earnstine Regal, PA-C  oxyCODONE-acetaminophen (PERCOCET/ROXICET) 5-325 MG tablet take 1 tablet po every 6 hours as needed for post operative breakthrough pain 07/30/21   Earnstine Regal, PA-C  valACYclovir (VALTREX) 500 MG tablet Take 500 mg by mouth daily as needed (cold sores).     [provider]  Vitamin D, Ergocalciferol, (DRISDOL) 1.25 MG (50000 UT) CAPS capsule Take one tablet wkly Patient taking differently: Take 50,000 Units by mouth every 7 (seven) days. Take one tablet wkly 10/15/19   [name], DO    Family History Family History  Problem Relation Age of Onset   Diabetes Mother    Hypertension Mother    Healthy Father    Breast cancer Maternal Grandmother    Cancer Maternal Grandfather        brain, lung, liver   Other Paternal Grandmother    Heart attack Paternal Grandfather     Social  History Social History   Tobacco Use   Smoking status: Never   Smokeless tobacco: Never  Vaping Use   Vaping Use: Never used  Substance Use Topics   Alcohol use: No   Drug use: No     Allergies   Sulfa antibiotics, Topiramate, Duloxetine hcl, Ibuprofen, Metformin and related, and Penicillins   Review of Systems Review of Systems Per HPI  Physical Exam Triage Vital Signs ED Triage Vitals  Enc Vitals Group     BP 11/02/21 0951 140/85     Pulse --      Resp 11/02/21 0951 17     Temp --      Temp src --      SpO2 --      Weight --      Height --      Head Circumference --      Peak Flow --      Pain Score 11/02/21 0950 10     Pain Loc --      Pain Edu? --      Excl. in [place]? --    No data found.  Updated Vital Signs BP 140/85 (BP Location: Right Arm)   Resp 17   LMP 08/15/2014 Comment: partial hysterectomy  Visual Acuity Right Eye Distance:   Left Eye Distance:   Bilateral Distance:    Right Eye Near:   Left Eye Near:    Bilateral Near:     Physical Exam Vitals and nursing note reviewed.  Constitutional:      Appearance: Normal appearance. She is not ill-appearing.  HENT:     Head: Atraumatic.  Eyes:     Extraocular Movements: Extraocular movements intact.     Conjunctiva/sclera: Conjunctivae normal.  Cardiovascular:     Rate and Rhythm: Normal rate and regular rhythm.     Heart sounds: Normal heart sounds.  Pulmonary:     Effort: Pulmonary effort is normal.     Breath sounds: Normal breath sounds.  Musculoskeletal:        General: Signs of injury present. Normal range of motion.     Cervical back: Normal range of motion and neck supple.  Skin:    General: Skin is warm.     Comments: Large 2.5 cm avulsion injury to the palmar surface of the right index finger, bleeding overall fairly well controlled at rest and range of motion appears intact diffusely.  No damage to the nail and no obvious foreign body noted on exam  Neurological:     Mental  Status: She is alert and oriented to person, place, and time.     Comments: Right hand neurovascularly intact  Psychiatric:        Mood and Affect: Mood normal.        Thought Content: Thought content normal.        Judgment: Judgment normal.     UC Treatments / Results  Labs (all labs ordered are listed, but only abnormal results are displayed) Labs Reviewed - No data to display  EKG   Radiology DG Finger Index Right  Result Date: 11/02/2021 CLINICAL DATA:  Laceration. EXAM: RIGHT INDEX FINGER 2+V COMPARISON:  None. FINDINGS: The bony structures are intact. No acute fracture. No radiopaque foreign body. IMPRESSION: No acute bony findings. Electronically Signed   By: Marijo Sanes M.D.   On: 11/02/2021 10:05    Procedures Procedures (including critical care time)  Medications Ordered in UC Medications  Tdap (BOOSTRIX) injection 0.5 mL (0.5 mLs Intramuscular Given 11/02/21 1101)    Initial Impression / Assessment and Plan / UC Course  I have reviewed the triage vital signs and the nursing notes.  Pertinent labs & imaging results that were available during my care of the patient were reviewed by me and considered in my medical decision making (see chart for details).     Wound irrigated, cleaned with Hibiclens and pressure dressing applied.  X-ray performed to ensure no bony injury or metal fragments, this was negative for both.  Will start on Keflex, Hibiclens, mupirocin ointment and discussed home wound care.  Follow-up with PCP for recheck in about a week.  Strict return precautions given for worsening symptoms.  Small supply of hydrocodone given for severe pain as needed in addition to over-the-counter pain relievers.  Tdap updated today as guidelines indicate 5 years for injury.  Her last one was in 2015.  Final Clinical Impressions(s) / UC Diagnoses   Final diagnoses:  Laceration of right index finger without foreign body without damage to nail, initial encounter   Avulsion of finger, initial encounter   Discharge Instructions   None    ED Prescriptions     Medication Sig Dispense Auth. Provider   doxycycline (VIBRAMYCIN) 100 MG capsule Take 1 capsule (100 mg total) by mouth 2 (two) times daily. 14 capsule Volney American, [place]   chlorhexidine (HIBICLENS) 4 % external liquid Apply topically daily as needed. 120 mL Volney American, PA-C   mupirocin ointment (BACTROBAN) 2 % Apply 1 application topically 2 (two) times daily. 80 g Volney American, [place]   HYDROcodone-acetaminophen (NORCO/VICODIN) 5-325 MG tablet Take 1 tablet by mouth every 6 (six) hours as needed for moderate pain. 30 tablet Volney American, [place]      I have reviewed the PDMP during this encounter.   Volney American, [place] 11/02/21 [place], [place], [place] 11/02/21 1452

## 2021-11-02 NOTE — ED Triage Notes (Signed)
Pt presents with a laceration to the R index finger. States she cut herself with a wheel grinder around 0900.

## 2021-11-21 ENCOUNTER — Encounter: Payer: Self-pay | Admitting: Emergency Medicine

## 2021-11-21 ENCOUNTER — Emergency Department
Admission: EM | Admit: 2021-11-21 | Discharge: 2021-11-21 | Disposition: A | Discharge status: Home / Self Care | Payer: PPO | Attending: Emergency Medicine | Admitting: Emergency Medicine

## 2021-11-21 ENCOUNTER — Emergency Department: Payer: PPO

## 2021-11-21 DIAGNOSIS — Z79899 Other long term (current) drug therapy: Secondary | ICD-10-CM | POA: Insufficient documentation

## 2021-11-21 DIAGNOSIS — M79602 Pain in left arm: Secondary | ICD-10-CM | POA: Insufficient documentation

## 2021-11-21 DIAGNOSIS — M546 Pain in thoracic spine: Secondary | ICD-10-CM | POA: Insufficient documentation

## 2021-11-21 DIAGNOSIS — R531 Weakness: Secondary | ICD-10-CM | POA: Insufficient documentation

## 2021-11-21 DIAGNOSIS — M25511 Pain in right shoulder: Secondary | ICD-10-CM | POA: Insufficient documentation

## 2021-11-21 DIAGNOSIS — M25512 Pain in left shoulder: Secondary | ICD-10-CM | POA: Insufficient documentation

## 2021-11-21 DIAGNOSIS — I10 Essential (primary) hypertension: Secondary | ICD-10-CM | POA: Insufficient documentation

## 2021-11-21 DIAGNOSIS — E119 Type 2 diabetes mellitus without complications: Secondary | ICD-10-CM | POA: Insufficient documentation

## 2021-11-21 DIAGNOSIS — Z8616 Personal history of COVID-19: Secondary | ICD-10-CM | POA: Diagnosis not present

## 2021-11-21 DIAGNOSIS — Z7984 Long term (current) use of oral hypoglycemic drugs: Secondary | ICD-10-CM | POA: Insufficient documentation

## 2021-11-21 DIAGNOSIS — M79601 Pain in right arm: Secondary | ICD-10-CM | POA: Diagnosis not present

## 2021-11-21 LAB — CBC WITH DIFFERENTIAL/PLATELET
Abs Immature Granulocytes: 0.03 10*3/uL (ref 0.00–0.07)
Basophils Absolute: 0 10*3/uL (ref 0.0–0.1)
Basophils Relative: 0 %
Eosinophils Absolute: 0.2 10*3/uL (ref 0.0–0.5)
Eosinophils Relative: 2 %
HCT: 38 % (ref 36.0–46.0)
Hemoglobin: 12.5 g/dL (ref 12.0–15.0)
Immature Granulocytes: 0 %
Lymphocytes Relative: 27 %
Lymphs Abs: 2.1 10*3/uL (ref 0.7–4.0)
MCH: 28.3 pg (ref 26.0–34.0)
MCHC: 32.9 g/dL (ref 30.0–36.0)
MCV: 86.2 fL (ref 80.0–100.0)
Monocytes Absolute: 0.5 10*3/uL (ref 0.1–1.0)
Monocytes Relative: 7 %
Neutro Abs: 4.8 10*3/uL (ref 1.7–7.7)
Neutrophils Relative %: 64 %
Platelets: 151 10*3/uL (ref 150–400)
RBC: 4.41 MIL/uL (ref 3.87–5.11)
RDW: 12.5 % (ref 11.5–15.5)
WBC: 7.6 10*3/uL (ref 4.0–10.5)
nRBC: 0 % (ref 0.0–0.2)

## 2021-11-21 LAB — COMPREHENSIVE METABOLIC PANEL
ALT: 18 U/L (ref 0–44)
AST: 19 U/L (ref 15–41)
Albumin: 3.6 g/dL (ref 3.5–5.0)
Alkaline Phosphatase: 64 U/L (ref 38–126)
Anion gap: 7 (ref 5–15)
BUN: 10 mg/dL (ref 6–20)
CO2: 25 mmol/L (ref 22–32)
Calcium: 8.5 mg/dL — ABNORMAL LOW (ref 8.9–10.3)
Chloride: 103 mmol/L (ref 98–111)
Creatinine, Ser: 0.53 mg/dL (ref 0.44–1.00)
GFR, Estimated: >60 mL/min (ref >60)
Glucose, Bld: 164 mg/dL — ABNORMAL HIGH (ref 70–99)
Potassium: 4 mmol/L (ref 3.5–5.1)
Sodium: 135 mmol/L (ref 135–145)
Total Bilirubin: 0.5 mg/dL (ref 0.3–1.2)
Total Protein: 6.9 g/dL (ref 6.5–8.1)

## 2021-11-21 LAB — CK: Total CK: 40 U/L (ref 38–234)

## 2021-11-21 MED ORDER — OXYCODONE HCL 5 MG PO TABS
5.0000 mg | ORAL_TABLET | Freq: Three times a day (TID) | ORAL | 0 refills | Status: AC | PRN
Start: 2021-11-21 — End: 2021-11-26

## 2021-11-21 MED ORDER — KETOROLAC TROMETHAMINE 30 MG/ML IJ SOLN
15.0000 mg | Freq: Once | INTRAMUSCULAR | Status: AC
  Administered 2021-11-21: 10:00:00 15 mg via INTRAVENOUS
  Filled 2021-11-21: qty 1

## 2021-11-21 MED ORDER — CYCLOBENZAPRINE HCL 10 MG PO TABS
10.0000 mg | ORAL_TABLET | Freq: Once | ORAL | Status: AC
  Administered 2021-11-21: 10:00:00 10 mg via ORAL
  Filled 2021-11-21: qty 1

## 2021-11-21 MED ORDER — NAPROXEN 250 MG PO TABS: 250.0000 mg | ORAL_TABLET | Freq: Two times a day (BID) | ORAL | 0 refills | Status: AC

## 2021-11-21 MED ORDER — MORPHINE SULFATE (PF) 4 MG/ML IV SOLN
4.0000 mg | Freq: Once | INTRAVENOUS | Status: AC
  Administered 2021-11-21: 08:00:00 4 mg via INTRAVENOUS
  Filled 2021-11-21: qty 1

## 2021-11-21 NOTE — Discharge Instructions (Signed)
Your blood work was reassuring. If you develop any numbness or weakness in the extremities, including your lower extremities, please return to the emergency department.  Please take the naproxen twice daily for pain.  You can use oxycodone for breakthrough pain.  Please follow-up with orthopedic specialist for further evaluation.

## 2021-11-21 NOTE — ED Provider Notes (Signed)
[facility]  ____________________________________________   Event Date/Time   First MD Initiated Contact with Patient 11/21/21 [phone]     (approximate)  I have reviewed the triage vital signs and the nursing notes.   HISTORY  Chief Complaint Shoulder Pain and Arm stiffness    HPI Samantha [name] is a 40 y.o. female with past medical history of fibromyalgia, diabetes, FBN2 mutation, MVP and dilated aortic root who presents with bilateral arm pain and weakness.  Patient noted some mid back pain over the last several days.  Then in the middle of the night she developed pain in the bilateral shoulders and difficulty moving her arms.  Pain is located in the bilateral shoulders wrapping around to the bilateral upper arms.  Pain does not go beyond the elbows and she is able to move her wrist and elbows normally but has significant difficulty moving the arms otherwise.  She has difficulty differentiating between the weakness and pain.  Does have some mild numbness.  She denies any lower extremity weakness numbness or tingling or bowel or bladder incontinence.  She denies any fevers chills, history of IV drug use.  She denies any prior injuries.  Has never had this before.         Past Medical History:  Diagnosis Date   Anemia    Angio-edema    Anxiety    Complication of anesthesia    slow to wake up 07/26/2021   COVID    sept 2021 flu like symptoms and cough.   lasted 5 days. 07/26/2021   Depression    rx presc but not taken   Diabetes mellitus without complication ([place])    Dilated aortic root ([place])    a. 05/2020 Echo: Ao root 31mm.   Fibromyalgia    GERD (gastroesophageal reflux disease)    no meds   H/o Sinus infection    Headache(784.0)    tension and with anxiety hx. complicated migraine 03/50/0938   Hepatic steatosis    History of kidney stones    MVP (mitral valve prolapse)    a. Pt told MVP present as child; b. 05/2020 Echo: EF 55-60%, no rwma, nl RV  size/fxn, triv MR, mild AI. Ao root 57mm.   Pneumonia    hx   Seizures ([place])    a. non epileptic events per epilepsy monitoring unit; b. last seizure July 2015- does not convulse but has a sleep effect. pt states she doesnt have seizures, she has complicated migraines and causes her to have sleep like effective per pt. pt goes to [facility] and use to see Dr. Hillis Range but she left the practiced and doesnt have new dr. 07/27/2021   Sleep apnea    has cpap does not use   Wears glasses    07/26/2021    Patient Active Problem List   Diagnosis Date Noted   Chest pain 04/08/2020   SOB (shortness of breath) 04/08/2020   Palpitations 04/08/2020   Essential hypertension 06/12/2019   Mood disorder ([place]) 06/12/2019   Ureteral calculus 18/29/9371   Renal colic 69/67/8938   Hydronephrosis with urinary obstruction due to ureteral calculus 05/20/2019   Irritability and anger 03/06/2019   Heartburn 03/04/2019   Chronic rhinitis 03/04/2019   Adverse food reaction 03/04/2019   Globus sensation 02/28/2019   Finger pain, left 01/01/2019   Allergy history unknown- possible throat closure sensation to certain foods 09/20/5101   Neuropathic pain 09/04/2018   Chronic radicular pain of lower back  09/04/2018   Lumbar foraminal stenosis-L5-S1 09/04/2018   Osteoarthritis of spine with radiculopathy, lumbar region 09/04/2018   Obesity, Class I, BMI 30-34.9 09/04/2018   Piriformis syndrome, left 07/12/2018   Single episode of elevated blood pressure 07/12/2018   Hypertriglyceridemia 04/11/2018   Low level of high density lipoprotein (HDL) 04/11/2018   Vitamin D insufficiency 04/11/2018   Epigastric abdominal pain with N 04/06/2018   History of vitamin D deficiency 04/06/2018   Spondylosis without myelopathy or radiculopathy, lumbar region 82/99/3716   Acute folliculitis- likely due to MRSA, they are draining 06/19/2017   Personal history of MRSA (methicillin resistant Staphylococcus aureus)  06/19/2017   Chronic diarrhea  06/19/2017   Contact dermatitis and eczema- due to tape\ Band-Aid 06/19/2017   Abdominal pain, LUQ 05/02/2017   Abdominal pain, LLQ 05/02/2017   Hx of diarrhea 05/02/2017   Environmental and seasonal allergies 09/29/2016   GERD (gastroesophageal reflux disease) 09/29/2016   Bipolar 1 disorder - Anxiety 09/28/2016   Fibromyalgia 08/11/2016   Type 2 diabetes mellitus with complication, without long-term current use of insulin ([place]) 12/29/2015   OSA on CPAP 12/08/2014   Obstructive sleep apnea syndrome, moderate 11/24/2014   Fatty liver 09/05/2014   S/P abdominal hysterectomy 08/15/2014   Bladder dysfunction 04/30/2014   Nephrolithiasis 03/04/2014   Agenesis, corpus callosum ([place]) 02/17/2014   GAD (generalized anxiety disorder) 02/17/2014   History of blood transfusion 02/17/2014   Obesity (BMI 30-39.9) 02/17/2014    Past Surgical History:  Procedure Laterality Date   ABDOMINAL HYSTERECTOMY N/A 08/15/2014   Procedure: HYSTERECTOMY ABDOMINAL WITH CYSTOSCOPY;  Surgeon: Sanjuana Kava, MD;  Location: [facility];  Service: Gynecology;  Laterality: N/A;   BLADDER SUSPENSION N/A 07/30/2021   Procedure: TRANSVAGINAL TAPE (TVT) PROCEDURE;  Surgeon: Sanjuana Kava, MD;  Location: [facility];  Service: Gynecology;  Laterality: N/A;   CESAREAN SECTION N/A    2010 07/26/2021   CYSTOSCOPY N/A 07/30/2021   Procedure: CYSTOSCOPY;  Surgeon: Sanjuana Kava, MD;  Location: [facility];  Service: Gynecology;  Laterality: N/A;   CYSTOSCOPY/RETROGRADE/URETEROSCOPY Left 05/23/2019   Procedure: CYSTOSCOPY/RETROGRADE/URETEROSCOPY;  Surgeon: Abbie Sons, MD;  Location: [facility];  Service: Urology;  Laterality: Left;   KNEE ARTHROSCOPY     yr 2020 07/26/2021   SHOULDER ARTHROSCOPY WITH SUBACROMIAL DECOMPRESSION AND OPEN ROTATOR C Left 11/27/2015   Procedure: LEFT SHOULDER ARTHROSCOPY WITH SUBACROMIAL DECOMPRESSION, MINI OPEN ROTATOR CUFF REPAIR;  Surgeon:  Netta Cedars, MD;  Location: [facility];  Service: Orthopedics;  Laterality: Left;   TUBAL LIGATION      Prior to Admission medications   Medication Sig Start Date End Date Taking? Authorizing Provider  naproxen (NAPROSYN) 250 MG tablet Take 1 tablet (250 mg total) by mouth 2 (two) times daily with a meal. 11/21/21 12/21/21 Yes Rada Hay, MD  oxyCODONE (ROXICODONE) 5 MG immediate release tablet Take 1 tablet (5 mg total) by mouth every 8 (eight) hours as needed for up to 5 days. 11/21/21 11/26/21 Yes Rada Hay, MD  amitriptyline (ELAVIL) 10 MG tablet Take 50 mg by mouth at bedtime.  07/19/19   [provider]  bethanechol (URECHOLINE) 10 MG tablet take 1 tablet po 3 times a day for 3 days; may take an additional 3 days to help with urination 07/30/21   Earnstine Regal, PA-C  chlorhexidine (HIBICLENS) 4 % external liquid Apply topically daily as needed. 11/02/21   Volney American, PA-C  doxycycline (VIBRAMYCIN) 100 MG capsule Take 1 capsule (100 mg  total) by mouth 2 (two) times daily. 11/02/21   Volney American, PA-C  FLUoxetine (PROZAC) 20 MG tablet TAKE 1 TABLET (20 MG TOTAL) BY MOUTH DAILY. 07/27/21   Lorrene Reid, PA-C  HYDROcodone-acetaminophen (NORCO/VICODIN) 5-325 MG tablet Take 1 tablet by mouth every 6 (six) hours as needed for moderate pain. 11/02/21   Volney American, PA-C  liraglutide (VICTOZA) 18 MG/3ML SOPN Inject 1.2 mg into the skin daily.    [provider]  metoprolol succinate (TOPROL-XL) 25 MG 24 hr tablet TAKE 1/2 TABLET BY MOUTH DAILY 07/27/21   End, Harrell Gave, MD  mupirocin ointment (BACTROBAN) 2 % Apply 1 application topically 2 (two) times daily. 11/02/21   Volney American, PA-C  nitrofurantoin, macrocrystal-monohydrate, (MACROBID) 100 MG capsule take 1 capsule po pc bid x 7 days 07/30/21   Earnstine Regal, PA-C  oxyCODONE-acetaminophen (PERCOCET/ROXICET) 5-325 MG tablet take 1 tablet po every 6 hours as needed for post  operative breakthrough pain 07/30/21   Earnstine Regal, PA-C  valACYclovir (VALTREX) 500 MG tablet Take 500 mg by mouth daily as needed (cold sores).     [provider]  Vitamin D, Ergocalciferol, (DRISDOL) 1.25 MG (50000 UT) CAPS capsule Take one tablet wkly Patient taking differently: Take 50,000 Units by mouth every 7 (seven) days. Take one tablet wkly 10/15/19   Opalski, Deborah, DO    Allergies Sulfa antibiotics, Topiramate, Duloxetine hcl, Ibuprofen, Metformin and related, and Penicillins  Family History  Problem Relation Age of Onset   Diabetes Mother    Hypertension Mother    Healthy Father    Breast cancer Maternal Grandmother    Cancer Maternal Grandfather        brain, lung, liver   Other Paternal Grandmother    Heart attack Paternal Grandfather     Social History Social History   Tobacco Use   Smoking status: Never   Smokeless tobacco: Never  Vaping Use   Vaping Use: Never used  Substance Use Topics   Alcohol use: No   Drug use: No    Review of Systems   Review of Systems  Constitutional:  Negative for chills and fever.  Musculoskeletal:  Positive for arthralgias, back pain and myalgias.  Neurological:  Positive for weakness and numbness.  All other systems reviewed and are negative.  Physical Exam Updated Vital Signs BP (!) 112/53    Pulse 65    Temp 98.2 F (36.8 C) (Oral)    Resp 16    Wt 93 kg    LMP 08/15/2014 Comment: partial hysterectomy   SpO2 96%    BMI 30.27 kg/m   Physical Exam Vitals and nursing note reviewed.  Constitutional:      General: She is not in acute distress.    Appearance: Normal appearance.  HENT:     Head: Normocephalic and atraumatic.  Eyes:     General: No scleral icterus.    Conjunctiva/sclera: Conjunctivae normal.  Pulmonary:     Effort: Pulmonary effort is normal. No respiratory distress.     Breath sounds: No stridor.  Musculoskeletal:        General: No deformity or signs of injury.     Cervical back:  Normal range of motion.     Comments: Nml sensation of the BL upper extremities  Pain with palpation of the BL shoulders and upper arms  5/5 strength with grip, thumbs up, finger abduction, wrist extension bilaterally 4/5 strength with elbow flexion, 5/5 elbow extension  Patient has difficulty abducting and  flexing the shoulder, strength exam limited due to pain  5 out of 5 strength in the bilateral lower extremities  Tenderness to palpation in the upper thoracic midline   Skin:    General: Skin is dry.     Coloration: Skin is not jaundiced or pale.  Neurological:     Mental Status: She is alert and oriented to person, place, and time. Mental status is at baseline.  Psychiatric:        Mood and Affect: Mood normal.        Behavior: Behavior normal.     LABS (all labs ordered are listed, but only abnormal results are displayed)  Labs Reviewed  COMPREHENSIVE METABOLIC PANEL - Abnormal; Notable for the following components:      Result Value   Glucose, Bld 164 (*)    Calcium 8.5 (*)    All other components within normal limits  CBC WITH DIFFERENTIAL/PLATELET  CK   ____________________________________________  EKG   ____________________________________________  RADIOLOGY I, Madelin Headings, personally viewed and evaluated these images (plain radiographs) as part of my medical decision making, as well as reviewing the written report by the radiologist.  ED MD interpretation: I reviewed the x-rays of the bilateral shoulders which do not show any fracture dislocation    ____________________________________________   PROCEDURES  Procedure(s) performed (including Critical Care):  Procedures   ____________________________________________   INITIAL IMPRESSION / ASSESSMENT AND PLAN / ED COURSE   Patient is a 40 year old female with a fibrillin 2 mutation who presents with bilateral upper arm pain and weakness.  Started with some thoracic back pain several days ago  and overnight developed significant pain of the bilateral shoulders and upper arms and difficulty moving her arms.  Symptoms are bilateral.  She does endorse weakness but difficulty differentiating weakness from pain that is there is a large pain component.  On exam she clearly appears uncomfortable.  Her distal strength is unremarkable and sensory exam is also normal.  She has somewhat decreased strength with elbow flexion and I am really unable to assess her strength with shoulder flexion and abduction secondary to pain.  She has no lower extremity symptoms.  No preceding injury.  Shoulder x-rays were obtained from triage which are normal.  With her bilateral symptoms and concern for potential myopathy versus cord pathology.  Will treat the pain with IV opiates and check basic labs including a CK to assess for myositis/myopathy.  If still having weakness after meds may need to obtain an MRI to rule out spinal cord pathology.  Labs are all reassuring.  On repeat assessment after morphine, patient's pain is somewhat improved although she still does have pain.  She is able to abduct the arm and on resistance she does demonstrate 5 out of 5 strength.  I am less concerned for weakness at this point and have lower suspicion for spinal cord pathology.  Seems like the right shoulder is significantly worse than the left and suspect primary musculoskeletal etiology. she would felt further improved after Toradol and Flexeril.  Discharged with naproxen and a short course of oxycodone.  We will also referred her to orthopedics.  Discussed with the patient that if she does develop any weakness or numbness or any other symptoms in her lower extremities that she return to the emergency department.  She is stable for discharge.     ____________________________________________   FINAL CLINICAL IMPRESSION(S) / ED DIAGNOSES  Final diagnoses:  Pain in both upper extremities  ED Discharge Orders          Ordered     naproxen (NAPROSYN) 250 MG tablet  2 times daily with meals        11/21/21 1035    oxyCODONE (ROXICODONE) 5 MG immediate release tablet  Every 8 hours PRN        11/21/21 1035             Note:  This document was prepared using Dragon voice recognition software and may include unintentional dictation errors.    Rada Hay, MD 11/21/21 1037

## 2021-11-21 NOTE — ED Notes (Signed)
See triage note. Pt stating she woke up this morning around 0300 unable to move both of her arms. Pt also c/o pain that is worse in the right. Pt sensation and pulses intact. Grips equal.

## 2021-11-21 NOTE — ED Triage Notes (Signed)
Pt in with bilateral "arm stiffness", and pain with movement to both shoulders. Pt able to move wrists, but not above elbows. Denies any back or other injuries. Ambulatory, neuro intact

## 2021-11-30 ENCOUNTER — Ambulatory Visit (INDEPENDENT_AMBULATORY_CARE_PROVIDER_SITE_OTHER): Payer: PPO | Admitting: Nurse Practitioner

## 2021-11-30 ENCOUNTER — Other Ambulatory Visit: Payer: Self-pay

## 2021-11-30 ENCOUNTER — Encounter: Payer: Self-pay | Admitting: Nurse Practitioner

## 2021-11-30 VITALS — Ht 69.0 in | Wt 205.0 lb

## 2021-11-30 DIAGNOSIS — U071 COVID-19: Secondary | ICD-10-CM

## 2021-11-30 DIAGNOSIS — H6690 Otitis media, unspecified, unspecified ear: Secondary | ICD-10-CM

## 2021-11-30 DIAGNOSIS — J069 Acute upper respiratory infection, unspecified: Secondary | ICD-10-CM | POA: Diagnosis not present

## 2021-11-30 MED ORDER — OFLOXACIN 0.3 % OT SOLN
5.0000 [drp] | Freq: Two times a day (BID) | OTIC | 0 refills | Status: DC
Start: 2021-11-30 — End: 2022-01-06

## 2021-11-30 MED ORDER — MOLNUPIRAVIR EUA 200MG CAPSULE: 4.0000 | ORAL_CAPSULE | Freq: Two times a day (BID) | ORAL | 0 refills | Status: AC

## 2021-11-30 NOTE — Progress Notes (Signed)
Virtual Visit via Telephone Note  I connected with Samantha [name] on 12/06/21 at  3:50 PM EST by telephone and verified that I am speaking with the correct person using two identifiers.  Location: Patient: home  Provider: [facility]     I discussed the limitations, risks, security and privacy concerns of performing an evaluation and management service by telephone and the availability of in person appointments. I also discussed with the patient that there may be a patient responsible charge related to this service. The patient expressed understanding and agreed to proceed.   History of Present Illness: The patient presents for acute visit. States that she started feeling sick on Sunday. Had severe ear pain. Had headache, nasal congestion, sore throat, and fever. She states that symptoms have been getting worse since the onset. She has been taking OTC medication to alleviate symptoms. This helps some but only for a few hours, but then symptoms come right back. She states that she did take a test for COVID 19 test this morning and it was positive.    Observations/Objective:  The patient is alert and oriented. She is pleasant and answers all questions appropriately. Breathing is non-labored. She is in no acute distress at this time.  She has nasal congestion. A loose cough can be heard during the phone visit.   Today's Vitals   11/30/21 1513  Weight: 205 lb (93 kg)  Height: 5\' 9"  (1.753 m)   Body mass index is 30.27 kg/m.   Assessment and Plan: 1. Upper respiratory tract infection due to COVID-19 virus Will start molnupiravir 800mg  twice daily for next five days. Rest and increase fluids. Continue using OTC medication to control symptoms.  Recommend she also add vitamins C, D, and zinc. Reviewed isolations and quarantine procedures with the patient. Advised her to seek emergency care If symptoms worsen, if she develops shortness of breath, or chest pain, she  should seek emergency care immediately. She voiced understanding and agreement with all instructions.  - molnupiravir EUA (LAGEVRIO) 200 mg CAPS capsule; Take 4 capsules (800 mg total) by mouth 2 (two) times daily for 5 days.  Dispense: 40 capsule; Refill: 0  2. Acute otitis media, unspecified otitis media type Start ofloxacin ear drops - inert five drop into the left ear twice daily for next seven days.  - ofloxacin (FLOXIN OTIC) 0.3 % OTIC solution; Place 5 drops into the left ear 2 (two) times daily.  Dispense: 5 mL; Refill: 0   Follow Up Instructions:    I discussed the assessment and treatment plan with the patient. The patient was provided an opportunity to ask questions and all were answered. The patient agreed with the plan and demonstrated an understanding of the instructions.   The patient was advised to call back or seek an in-person evaluation if the symptoms worsen or if the condition fails to improve as anticipated.  I provided 10 minutes of non-face-to-face time during this encounter.   Ronnell Freshwater, NP

## 2021-12-01 ENCOUNTER — Ambulatory Visit: Payer: PPO | Admitting: Physician Assistant

## 2021-12-02 ENCOUNTER — Telehealth: Payer: Self-pay | Admitting: Physician Assistant

## 2021-12-02 NOTE — Telephone Encounter (Signed)
Patient advised per Nira Conn to do supportive care (tylenol and ibuprofen for pain, delsym for cough) Patient states her ear is draining pus/bloody liquid. I advised it sounds like her eardrum has ruptured and to d/c ear drops, per Heather. Also suggested the use a warm wash cloth to press against the ear to help relieve pain. They verbalized understanding and will call back if they need anything else. AS, CMA

## 2021-12-02 NOTE — Telephone Encounter (Signed)
Husband called and stated patient has a horrible ear ache and cough and the antiviral medication is not helping with those symptoms at all. They are requesting something to be called into pharmacy to help with those symptoms. Please advise [phone]

## 2021-12-06 DIAGNOSIS — H6690 Otitis media, unspecified, unspecified ear: Secondary | ICD-10-CM | POA: Insufficient documentation

## 2021-12-06 DIAGNOSIS — U071 COVID-19: Secondary | ICD-10-CM | POA: Insufficient documentation

## 2021-12-06 DIAGNOSIS — J069 Acute upper respiratory infection, unspecified: Secondary | ICD-10-CM | POA: Insufficient documentation

## 2021-12-16 ENCOUNTER — Ambulatory Visit: Payer: Commercial Managed Care - HMO | Admitting: Internal Medicine

## 2022-01-04 NOTE — Progress Notes (Signed)
Not seen- error

## 2022-01-05 ENCOUNTER — Ambulatory Visit: Payer: Commercial Managed Care - HMO | Admitting: Internal Medicine

## 2022-01-06 ENCOUNTER — Encounter: Payer: Self-pay | Admitting: Physician Assistant

## 2022-01-06 ENCOUNTER — Other Ambulatory Visit: Payer: Self-pay

## 2022-01-06 ENCOUNTER — Ambulatory Visit (INDEPENDENT_AMBULATORY_CARE_PROVIDER_SITE_OTHER): Payer: Medicare Other | Admitting: Physician Assistant

## 2022-01-06 VITALS — BP 112/70 | HR 98 | Temp 97.6°F | Ht 69.0 in | Wt 199.0 lb

## 2022-01-06 DIAGNOSIS — L249 Irritant contact dermatitis, unspecified cause: Secondary | ICD-10-CM | POA: Diagnosis not present

## 2022-01-06 MED ORDER — METHYLPREDNISOLONE 4 MG PO TBPK: ORAL_TABLET | ORAL | 0 refills | Status: DC

## 2022-01-06 NOTE — Patient Instructions (Signed)
Contact Dermatitis Dermatitis is redness, soreness, and swelling (inflammation) of the skin. Contact dermatitis is a reaction to something that touches the skin. There are two types of contact dermatitis: Irritant contact dermatitis. This happens when something bothers (irritates) your skin, like soap. Allergic contact dermatitis. This is caused when you are exposed to something that you are allergic to, such as poison ivy. What are the causes? Common causes of irritant contact dermatitis include: Makeup. Soaps. Detergents. Bleaches. Acids. Metals, such as nickel. Common causes of allergic contact dermatitis include: Plants. Chemicals. Jewelry. Latex. Medicines. Preservatives in products, such as clothing. What increases the risk? Having a job that exposes you to things that bother your skin. Having asthma or eczema. What are the signs or symptoms? Symptoms may happen anywhere the irritant has touched your skin. Symptoms include: Dry or flaky skin. Redness. Cracks. Itching. Pain or a burning feeling. Blisters. Blood or clear fluid draining from skin cracks. With allergic contact dermatitis, swelling may occur. This may happen in places such as the eyelids, mouth, or genitals. How is this treated? This condition is treated by checking for the cause of the reaction and protecting your skin. Treatment may also include: Steroid creams, ointments, or medicines. Antibiotic medicines or other ointments, if you have a skin infection. Lotion or medicines to help with itching. A bandage (dressing). Follow these instructions at home: Skin care Moisturize your skin as needed. Put cool cloths on your skin. Put a baking soda paste on your skin. Stir water into baking soda until it looks like a paste. Do not scratch your skin. Avoid having things rub up against your skin. Avoid the use of soaps, perfumes, and dyes. Medicines Take or apply over-the-counter and prescription medicines  only as told by your doctor. If you were prescribed an antibiotic medicine, take or apply it as told by your doctor. Do not stop using it even if your condition starts to get better. Bathing Take a bath with: Epsom salts. Baking soda. Colloidal oatmeal. Bathe less often. Bathe in warm water. Avoid using hot water. Bandage care If you were given a bandage, change it as told by your health care provider. Wash your hands with soap and water before and after you change your bandage. If soap and water are not available, use hand sanitizer. General instructions Avoid the things that caused your reaction. If you do not know what caused it, keep a journal. Write down: What you eat. What skin products you use. What you drink. What you wear in the area that has symptoms. This includes jewelry. Check the affected areas every day for signs of infection. Check for: More redness, swelling, or pain. More fluid or blood. Warmth. Pus or a bad smell. Keep all follow-up visits as told by your doctor. This is important. Contact a doctor if: You do not get better with treatment. Your condition gets worse. You have signs of infection, such as: More swelling. Tenderness. More redness. Soreness. Warmth. You have a fever. You have new symptoms. Get help right away if: You have a very bad headache. You have neck pain. Your neck is stiff. You throw up (vomit). You feel very sleepy. You see red streaks coming from the area. Your bone or joint near the area hurts after the skin has healed. The area turns darker. You have trouble breathing. Summary Dermatitis is redness, soreness, and swelling of the skin. Symptoms may occur where the irritant has touched you. Treatment may include medicines and skin care. If you  do not know what caused your reaction, keep a journal. Contact a doctor if your condition gets worse or you have signs of infection. This information is not intended to replace advice  given to you by your health care provider. Make sure you discuss any questions you have with your health care provider. Document Revised: 03/13/2019 Document Reviewed: 06/06/2018 Elsevier Patient Education  [place].

## 2022-01-06 NOTE — Progress Notes (Signed)
Established patient acute visit   Patient: Samantha [name]   DOB: 1981/04/20   41 y.o. Female  MRN: [id number] Visit Date: 01/06/2022  Chief Complaint  Patient presents with   Acute Visit   Rash   Subjective    HPI  Patient has c/o facial rash. Rash appeared Saturday (4 days ago). Rash is red, warm and itchy. Patient reports applied cream for dry skin and the next day woke up with her face red and itchy. No new soaps, detergents or other major lifestyle changes. States tattoo glue which has vitamin E and coconut oil has helped with the itching. Redness has improved as well. Denies fever, shortness of breath or tongue swelling.    Medications: Outpatient Medications Prior to Visit  Medication Sig   chlorhexidine (HIBICLENS) 4 % external liquid Apply topically daily as needed.   FLUoxetine (PROZAC) 20 MG tablet TAKE 1 TABLET (20 MG TOTAL) BY MOUTH DAILY.   HYDROcodone-acetaminophen (NORCO/VICODIN) 5-325 MG tablet Take 1 tablet by mouth every 6 (six) hours as needed for moderate pain.   liraglutide (VICTOZA) 18 MG/3ML SOPN Inject 1.2 mg into the skin daily.   metoprolol succinate (TOPROL-XL) 25 MG 24 hr tablet TAKE 1/2 TABLET BY MOUTH DAILY   mupirocin ointment (BACTROBAN) 2 % Apply 1 application topically 2 (two) times daily.   terconazole (TERAZOL 7) 0.4 % vaginal cream terconazole 0.4 % vaginal cream  INSERT 1 APPLICATORFUL EVERY DAY BY VAGINAL ROUTE AS DIRECTED FOR 7 DAYS.   valACYclovir (VALTREX) 500 MG tablet Take 500 mg by mouth daily as needed (cold sores).    Vitamin D, Ergocalciferol, (DRISDOL) 1.25 MG (50000 UT) CAPS capsule Take one tablet wkly (Patient taking differently: Take 50,000 Units by mouth every 7 (seven) days. Take one tablet wkly)   [DISCONTINUED] amitriptyline (ELAVIL) 10 MG tablet Take 50 mg by mouth at bedtime.  (Patient not taking: Reported on 11/30/2021)   [DISCONTINUED] bethanechol (URECHOLINE) 10 MG tablet take 1 tablet po 3 times a day for 3 days; may take an  additional 3 days to help with urination   [DISCONTINUED] doxycycline (VIBRAMYCIN) 100 MG capsule Take 1 capsule (100 mg total) by mouth 2 (two) times daily.   [DISCONTINUED] nitrofurantoin, macrocrystal-monohydrate, (MACROBID) 100 MG capsule take 1 capsule po pc bid x 7 days   [DISCONTINUED] ofloxacin (FLOXIN OTIC) 0.3 % OTIC solution Place 5 drops into the left ear 2 (two) times daily.   [DISCONTINUED] oxyCODONE-acetaminophen (PERCOCET/ROXICET) 5-325 MG tablet take 1 tablet po every 6 hours as needed for post operative breakthrough pain   No facility-administered medications prior to visit.    Review of Systems Review of Systems:  A fourteen system review of systems was performed and found to be positive as per HPI.    Objective    BP 112/70    Pulse 98    Temp 97.6 F (36.4 C)    Ht 5\' 9"  (1.753 m)    Wt 199 lb (90.3 kg)    LMP 08/15/2014 Comment: partial hysterectomy   SpO2 98%    BMI 29.39 kg/m    Physical Exam  General:  Well Developed, well nourished, appropriate for stated age.  Neuro:  Alert and oriented,  extra-ocular muscles intact  HEENT:  Normocephalic, atraumatic, neck supple  Skin:  mild facial swelling and erythema over both cheeks, with few raised macules underneath chin, raised erythematous papule at forehead  Cardiac:  RRR, S1 S2 Respiratory: CTA B/L  Vascular:  Ext warm, no cyanosis apprec.;  cap RF less 2 sec. Psych:  No HI/SI, judgement and insight good, Euthymic mood. Full Affect.   No results found for any visits on 01/06/22.  Assessment & Plan     Discussed with patient has s/sx suggestive of contact dermatitis and given location will start oral corticosteroid vs topical. Recommend to avoid cream used before rash onset and avoid scratching. Follow-up prn.   Return if symptoms worsen or fail to improve.        Lorrene Reid, PA-C  [facility] [phone] (phone) [phone] (fax)  [place]

## 2022-01-07 ENCOUNTER — Ambulatory Visit: Payer: Medicaid Other | Admitting: Nurse Practitioner

## 2022-01-07 ENCOUNTER — Encounter: Payer: Self-pay | Admitting: Nurse Practitioner

## 2022-01-07 NOTE — Progress Notes (Deleted)
Office Visit    Patient Name: Samantha [name] Date of Encounter: 01/07/2022  Primary Care Provider:  Lorrene Reid, PA-C Primary Cardiologist:  Samantha Bush, MD  Chief Complaint    41 y/o ? w/ a h/o chest pain, palpitations, HTN, HL, DMII, morbid obesity, OSA, GERD, hepatic steatosis, and fibromyalgia, who presents for f/u ***  Past Medical History    Past Medical History:  Diagnosis Date   Anemia    Angio-edema    Anxiety    Chest pain    a. 06/2020 MV: EF 55%, no ischemia or infarct.  No significant coronary calcium on attenuation correction CT.   Complication of anesthesia    slow to wake up 07/26/2021   COVID    sept 2021 flu like symptoms and cough.   lasted 5 days. 07/26/2021   Depression    rx presc but not taken   Diabetes mellitus without complication ([place])    Dilated aortic root ([place])    a. 05/2020 Echo: Ao root 74mm; b. 08/2021 Echo: Ao root 68mm, Asc Ao 35mm.   Fibromyalgia    GERD (gastroesophageal reflux disease)    no meds   H/o Sinus infection    Headache(784.0)    tension and with anxiety hx. complicated migraine 00/92/3300   Hepatic steatosis    History of kidney stones    MVP (mitral valve prolapse)    a. Pt told MVP present as child; b. 05/2020 Echo: EF 55-60%, no rwma, nl RV size/fxn, triv MR, mild AI. Ao root 87mm; c. 05/2021 Echo: EF 60-65%, no rwma, GrI DD, nl RV fxn, mildly dil LA, no MR, Ao root 57mm, Asc AO 87mm.   Pneumonia    hx   Seizures ([place])    a. non epileptic events per epilepsy monitoring unit; b. last seizure July 2015- does not convulse but has a sleep effect. pt states she doesnt have seizures, she has complicated migraines and causes her to have sleep like effective per pt. pt goes to [facility] and use to see Samantha [name] but she left the practiced and doesnt have new dr. 07/27/2021   Sleep apnea    has cpap does not use   Wears glasses    07/26/2021   Past Surgical History:  Procedure Laterality Date   ABDOMINAL  HYSTERECTOMY N/A 08/15/2014   Procedure: HYSTERECTOMY ABDOMINAL WITH CYSTOSCOPY;  Surgeon: Samantha Kava, MD;  Location: [facility];  Service: Gynecology;  Laterality: N/A;   BLADDER SUSPENSION N/A 07/30/2021   Procedure: TRANSVAGINAL TAPE (TVT) PROCEDURE;  Surgeon: Samantha Kava, MD;  Location: [place];  Service: Gynecology;  Laterality: N/A;   CESAREAN SECTION N/A    2010 07/26/2021   CYSTOSCOPY N/A 07/30/2021   Procedure: CYSTOSCOPY;  Surgeon: Samantha Kava, MD;  Location: [place];  Service: Gynecology;  Laterality: N/A;   CYSTOSCOPY/RETROGRADE/URETEROSCOPY Left 05/23/2019   Procedure: CYSTOSCOPY/RETROGRADE/URETEROSCOPY;  Surgeon: Samantha Sons, MD;  Location: [facility];  Service: Urology;  Laterality: Left;   KNEE ARTHROSCOPY     yr 2020 07/26/2021   SHOULDER ARTHROSCOPY WITH SUBACROMIAL DECOMPRESSION AND OPEN ROTATOR C Left 11/27/2015   Procedure: LEFT SHOULDER ARTHROSCOPY WITH SUBACROMIAL DECOMPRESSION, MINI OPEN ROTATOR CUFF REPAIR;  Surgeon: Samantha Cedars, MD;  Location: [place];  Service: Orthopedics;  Laterality: Left;   TUBAL LIGATION      Allergies  Allergies  Allergen Reactions   Sulfa Antibiotics Anaphylaxis and Swelling   Topiramate Swelling    Tongue  Duloxetine Hcl Other (See Comments)    Weight gain, feels "crazy"   Ibuprofen     Due to taking Elavil, causes counter headaches when she takes Ibuprofen   Metformin And Related Other (See Comments)    GI Upset   Penicillins Rash    Has patient had a PCN reaction causing immediate rash, facial/tongue/throat swelling, SOB or lightheadedness with hypotension: Yes Has patient had a PCN reaction causing severe rash involving mucus membranes or skin necrosis: No Has patient had a PCN reaction that required hospitalization No Has patient had a PCN reaction occurring within the last 10 years: No If all of the above answers are "NO", then may proceed with Cephalosporin use.     History of Present  Illness    41 y/o ? w/ the above PMH including chest pain, palpitations, HTN, HL, DMII, obesity, OSA, GERD, hepatic steatosis, ? MV prolapse, and fibromyalgia.  In May 2021, she was evaluated secondary to an incidental finding of enlarged heart on CT of the abdomen and pelvis, which was performed in the setting of abdominal pain.  She was also noted to have multiple ER visits for chest wall pain.  She subsequently underwent echocardiography which showed an EF of 55 to 60% without regional wall motion abnormalities, trivial MR, mild AI, and a dilated aortic root at 41 mm.  She was placed on beta-blocker therapy in the setting of palpitations.  In the setting of ongoing intermittent chest pain, she underwent Lexiscan Myoview in July 2021, which was low risk without evidence of ischemia or infarct.  Further, she had no evidence of coronary calcification on attenuation corrected images.  CTA of the chest was ordered to better evaluate her aortic root and establish her baseline however, patient did not carry through subsequently underwent repeat echo in September 2022, which showed stable aortic root at 42 mm and ascending aorta at 40 mm.  Ms. Samantha [name] was last seen in cardiology clinic in August 2022 as part of preoperative evaluation as she was pending transvaginal tape and cystoscopy at that time.  She is doing well from a cardiac standpoint.  Home Medications    Current Outpatient Medications  Medication Sig Dispense Refill   chlorhexidine (HIBICLENS) 4 % external liquid Apply topically daily as needed. 120 mL 0   FLUoxetine (PROZAC) 20 MG tablet TAKE 1 TABLET (20 MG TOTAL) BY MOUTH DAILY. 90 tablet 0   HYDROcodone-acetaminophen (NORCO/VICODIN) 5-325 MG tablet Take 1 tablet by mouth every 6 (six) hours as needed for moderate pain. 30 tablet 0   liraglutide (VICTOZA) 18 MG/3ML SOPN Inject 1.2 mg into the skin daily.     methylPREDNISolone (MEDROL DOSEPAK) 4 MG TBPK tablet Take as directed on package. 21  tablet 0   metoprolol succinate (TOPROL-XL) 25 MG 24 hr tablet TAKE 1/2 TABLET BY MOUTH DAILY 45 tablet 1   mupirocin ointment (BACTROBAN) 2 % Apply 1 application topically 2 (two) times daily. 80 g 0   terconazole (TERAZOL 7) 0.4 % vaginal cream terconazole 0.4 % vaginal cream  INSERT 1 APPLICATORFUL EVERY DAY BY VAGINAL ROUTE AS DIRECTED FOR 7 DAYS.     valACYclovir (VALTREX) 500 MG tablet Take 500 mg by mouth daily as needed (cold sores).      Vitamin D, Ergocalciferol, (DRISDOL) 1.25 MG (50000 UT) CAPS capsule Take one tablet wkly (Patient taking differently: Take 50,000 Units by mouth every 7 (seven) days. Take one tablet wkly) 12 capsule 3   No current facility-administered medications for this  visit.     Review of Systems    ***.  All other systems reviewed and are otherwise negative except as noted above.    Physical Exam    VS:  LMP 08/15/2014 Comment: partial hysterectomy , BMI There is no height or weight on file to calculate BMI.     GEN: Well nourished, well developed, in no acute distress. HEENT: normal. Neck: Supple, no JVD, carotid bruits, or masses. Cardiac: RRR, no murmurs, rubs, or gallops. No clubbing, cyanosis, edema.  Radials/DP/PT 2+ and equal bilaterally.  Respiratory:  Respirations regular and unlabored, clear to auscultation bilaterally. GI: Soft, nontender, nondistended, BS + x 4. MS: no deformity or atrophy. Skin: warm and dry, no rash. Neuro:  Strength and sensation are intact. Psych: Normal affect.  Accessory Clinical Findings    ECG personally reviewed by me today - *** - no acute changes.  Lab Results  Component Value Date   WBC 7.6 11/21/2021   HGB 12.5 11/21/2021   HCT 38.0 11/21/2021   MCV 86.2 11/21/2021   PLT 151 11/21/2021   Lab Results  Component Value Date   CREATININE 0.53 11/21/2021   BUN 10 11/21/2021   NA 135 11/21/2021   K 4.0 11/21/2021   CL 103 11/21/2021   CO2 25 11/21/2021   Lab Results  Component Value Date   ALT  18 11/21/2021   AST 19 11/21/2021   GGT 15 04/06/2018   ALKPHOS 64 11/21/2021   BILITOT 0.5 11/21/2021   Lab Results  Component Value Date   CHOL 149 08/12/2020   HDL 54 08/12/2020   LDLCALC 67 08/12/2020   TRIG 169 (H) 08/12/2020   CHOLHDL 2.8 08/12/2020    Lab Results  Component Value Date   HGBA1C 7.4 (H) 08/12/2020    Assessment & Plan    1.  ***   Murray Hodgkins, NP 01/07/2022, 1:46 PM

## 2022-01-17 ENCOUNTER — Other Ambulatory Visit: Payer: Self-pay | Admitting: Physician Assistant

## 2022-01-17 DIAGNOSIS — R454 Irritability and anger: Secondary | ICD-10-CM

## 2022-01-17 DIAGNOSIS — F411 Generalized anxiety disorder: Secondary | ICD-10-CM

## 2022-01-26 ENCOUNTER — Other Ambulatory Visit: Payer: Self-pay

## 2022-01-26 DIAGNOSIS — F411 Generalized anxiety disorder: Secondary | ICD-10-CM

## 2022-01-26 DIAGNOSIS — R454 Irritability and anger: Secondary | ICD-10-CM

## 2022-01-26 MED ORDER — FLUOXETINE HCL 20 MG PO CAPS: 20.0000 mg | ORAL_CAPSULE | Freq: Every day | ORAL | 0 refills | Status: DC

## 2022-02-21 ENCOUNTER — Encounter (HOSPITAL_COMMUNITY): Payer: Self-pay | Admitting: Emergency Medicine

## 2022-02-21 ENCOUNTER — Emergency Department (HOSPITAL_COMMUNITY): Payer: Medicare Other

## 2022-02-21 ENCOUNTER — Other Ambulatory Visit: Payer: Self-pay

## 2022-02-21 ENCOUNTER — Emergency Department (HOSPITAL_COMMUNITY)
Admission: EM | Admit: 2022-02-21 | Discharge: 2022-02-21 | Disposition: A | Discharge status: Home / Self Care | Payer: Medicare Other | Attending: Emergency Medicine | Admitting: Emergency Medicine

## 2022-02-21 ENCOUNTER — Other Ambulatory Visit: Payer: Self-pay | Admitting: Obstetrics & Gynecology

## 2022-02-21 DIAGNOSIS — N390 Urinary tract infection, site not specified: Secondary | ICD-10-CM | POA: Insufficient documentation

## 2022-02-21 DIAGNOSIS — Z794 Long term (current) use of insulin: Secondary | ICD-10-CM | POA: Insufficient documentation

## 2022-02-21 DIAGNOSIS — R102 Pelvic and perineal pain: Secondary | ICD-10-CM | POA: Diagnosis present

## 2022-02-21 DIAGNOSIS — N83202 Unspecified ovarian cyst, left side: Secondary | ICD-10-CM | POA: Insufficient documentation

## 2022-02-21 LAB — URINALYSIS, ROUTINE W REFLEX MICROSCOPIC
Bilirubin Urine: NEGATIVE
Glucose, UA: 50 mg/dL — AB
Hgb urine dipstick: NEGATIVE
Ketones, ur: NEGATIVE mg/dL
Nitrite: NEGATIVE
Protein, ur: NEGATIVE mg/dL
Specific Gravity, Urine: 1.016 (ref 1.005–1.030)
pH: 6 (ref 5.0–8.0)

## 2022-02-21 LAB — COMPREHENSIVE METABOLIC PANEL
ALT: 16 U/L (ref 0–44)
AST: 17 U/L (ref 15–41)
Albumin: 3.6 g/dL (ref 3.5–5.0)
Alkaline Phosphatase: 78 U/L (ref 38–126)
Anion gap: 8 (ref 5–15)
BUN: 11 mg/dL (ref 6–20)
CO2: 29 mmol/L (ref 22–32)
Calcium: 9 mg/dL (ref 8.9–10.3)
Chloride: 100 mmol/L (ref 98–111)
Creatinine, Ser: 0.69 mg/dL (ref 0.44–1.00)
GFR, Estimated: >60 mL/min (ref >60)
Glucose, Bld: 215 mg/dL — ABNORMAL HIGH (ref 70–99)
Potassium: 3.7 mmol/L (ref 3.5–5.1)
Sodium: 137 mmol/L (ref 135–145)
Total Bilirubin: 0.2 mg/dL — ABNORMAL LOW (ref 0.3–1.2)
Total Protein: 6.8 g/dL (ref 6.5–8.1)

## 2022-02-21 LAB — CBC
HCT: 41.3 % (ref 36.0–46.0)
Hemoglobin: 13.7 g/dL (ref 12.0–15.0)
MCH: 28.4 pg (ref 26.0–34.0)
MCHC: 33.2 g/dL (ref 30.0–36.0)
MCV: 85.7 fL (ref 80.0–100.0)
Platelets: 187 10*3/uL (ref 150–400)
RBC: 4.82 MIL/uL (ref 3.87–5.11)
RDW: 13.2 % (ref 11.5–15.5)
WBC: 7.5 10*3/uL (ref 4.0–10.5)
nRBC: 0 % (ref 0.0–0.2)

## 2022-02-21 LAB — I-STAT BETA HCG BLOOD, ED (MC, WL, AP ONLY): I-stat hCG, quantitative: <5 m[IU]/mL (ref <5)

## 2022-02-21 MED ORDER — NITROFURANTOIN MONOHYD MACRO 100 MG PO CAPS: 100.0000 mg | ORAL_CAPSULE | Freq: Two times a day (BID) | ORAL | 0 refills | Status: DC

## 2022-02-21 NOTE — Discharge Instructions (Addendum)
Take the antibiotics for your UTI. ?Follow-up with your OB/GYN regarding your ovarian cyst. ?Return to the ER if you start to experience worsening symptoms, fever, severe pain, lightheadedness, abnormal vaginal bleeding. ?

## 2022-02-21 NOTE — ED Triage Notes (Signed)
Patient with history of ovarian cyst here with complaint of left groin pain that feels like the pain she usually has with her ovarian cyst. Patient states she called her GYN and was told to go to ED, patient states her gynecologists plans to remove left ovary. ?

## 2022-02-21 NOTE — ED Provider Notes (Signed)
?[facility] ?Provider Note ? ? ?CSN: [id number] ?Arrival date & time: 02/21/22  [id number] ? ?  ? ?History ? ?Chief Complaint  ?Patient presents with  ? Groin Pain  ? ? ?Samantha [name] is a 41 y.o. female presenting to the ED with a chief complaint of left-sided pelvic pain.  States that she has a known history of an ovarian cyst and was told that she will eventually need the ovary removed.  She has been controlling her pain with Tylenol and opioid pain medicine up until this morning when she had severely worsening pain.  She denies any abnormal vaginal bleeding, vaginal discharge, abdominal pain, vomiting but does report nausea.  She is concerned that this is because of her ovarian cyst and when she got in touch with her OB/GYN over the weekend she was told to come to the ER. ? ? ?Groin Pain ?Pertinent negatives include no chest pain, no abdominal pain and no shortness of breath.  ? ?  ? ?Home Medications ?Prior to Admission medications   ?Medication Sig Start Date End Date Taking? Authorizing Provider  ?nitrofurantoin, macrocrystal-monohydrate, (MACROBID) 100 MG capsule Take 1 capsule (100 mg total) by mouth 2 (two) times daily. 02/21/22  Yes Levi Klaiber, PA-C  ?chlorhexidine (HIBICLENS) 4 % external liquid Apply topically daily as needed. 11/02/21   Volney American, PA-C  ?FLUoxetine (PROZAC) 20 MG capsule Take 1 capsule (20 mg total) by mouth daily. 01/26/22   Lorrene Reid, PA-C  ?HYDROcodone-acetaminophen (NORCO/VICODIN) 5-325 MG tablet Take 1 tablet by mouth every 6 (six) hours as needed for moderate pain. 11/02/21   Volney American, PA-C  ?liraglutide (VICTOZA) 18 MG/3ML SOPN Inject 1.2 mg into the skin daily.    [provider]  ?methylPREDNISolone (MEDROL DOSEPAK) 4 MG TBPK tablet Take as directed on package. 01/06/22   Lorrene Reid, PA-C  ?metoprolol succinate (TOPROL-XL) 25 MG 24 hr tablet TAKE 1/2 TABLET BY MOUTH DAILY 07/27/21   End, Harrell Gave,  MD  ?mupirocin ointment (BACTROBAN) 2 % Apply 1 application topically 2 (two) times daily. 11/02/21   Volney American, PA-C  ?terconazole (TERAZOL 7) 0.4 % vaginal cream terconazole 0.4 % vaginal cream ? INSERT 1 APPLICATORFUL EVERY DAY BY VAGINAL ROUTE AS DIRECTED FOR 7 DAYS.    [provider]  ?valACYclovir (VALTREX) 500 MG tablet Take 500 mg by mouth daily as needed (cold sores).     [provider]  ?Vitamin D, Ergocalciferol, (DRISDOL) 1.25 MG (50000 UT) CAPS capsule Take one tablet wkly ?Patient taking differently: Take 50,000 Units by mouth every 7 (seven) days. Take one tablet wkly 10/15/19   [name], DO  ?   ? ?Allergies    ?Sulfa antibiotics, Topiramate, Duloxetine hcl, Ibuprofen, Metformin and related, and Penicillins   ? ?Review of Systems   ?Review of Systems  ?Constitutional:  Negative for appetite change, chills and fever.  ?HENT:  Negative for ear pain, rhinorrhea, sneezing and sore throat.   ?Eyes:  Negative for photophobia and visual disturbance.  ?Respiratory:  Negative for cough, chest tightness, shortness of breath and wheezing.   ?Cardiovascular:  Negative for chest pain and palpitations.  ?Gastrointestinal:  Negative for abdominal pain, blood in stool, constipation, diarrhea, nausea and vomiting.  ?Genitourinary:  Positive for pelvic pain. Negative for dysuria, hematuria and urgency.  ?Musculoskeletal:  Negative for myalgias.  ?Skin:  Negative for rash.  ?Neurological:  Negative for dizziness, weakness and light-headedness.  ? ?Physical Exam ?Updated Vital Signs ?  BP 106/61   Pulse 88   Temp 98.1 ?F (36.7 ?C)   Resp 14   LMP 08/15/2014 Comment: partial hysterectomy  SpO2 96%  ?Physical Exam ?Vitals and nursing note reviewed.  ?Constitutional:   ?   General: She is not in acute distress. ?   Appearance: She is well-developed.  ?HENT:  ?   Head: Normocephalic and atraumatic.  ?   Nose: Nose normal.  ?Eyes:  ?   General: No scleral icterus.    ?   Left eye:  No discharge.  ?   Conjunctiva/sclera: Conjunctivae normal.  ?Cardiovascular:  ?   Rate and Rhythm: Normal rate and regular rhythm.  ?   Heart sounds: Normal heart sounds. No murmur heard. ?  No friction rub. No gallop.  ?Pulmonary:  ?   Effort: Pulmonary effort is normal. No respiratory distress.  ?   Breath sounds: Normal breath sounds.  ?Abdominal:  ?   General: Bowel sounds are normal. There is no distension.  ?   Palpations: Abdomen is soft.  ?   Tenderness: There is no abdominal tenderness. There is no guarding.  ?   Comments: Abdomen is soft, nontender nondistended.  ?Musculoskeletal:     ?   General: Normal range of motion.  ?   Cervical back: Normal range of motion and neck supple.  ?Skin: ?   General: Skin is warm and dry.  ?   Findings: No rash.  ?Neurological:  ?   Mental Status: She is alert.  ?   Motor: No abnormal muscle tone.  ?   Coordination: Coordination normal.  ? ? ?ED Results / Procedures / Treatments   ?Labs ?(all labs ordered are listed, but only abnormal results are displayed) ?Labs Reviewed  ?COMPREHENSIVE METABOLIC PANEL - Abnormal; Notable for the following components:  ?    Result Value  ? Glucose, Bld 215 (*)   ? Total Bilirubin 0.2 (*)   ? All other components within normal limits  ?URINALYSIS, ROUTINE W REFLEX MICROSCOPIC - Abnormal; Notable for the following components:  ? APPearance HAZY (*)   ? Glucose, UA 50 (*)   ? Leukocytes,Ua SMALL (*)   ? Bacteria, UA FEW (*)   ? All other components within normal limits  ?CBC  ?I-STAT BETA HCG BLOOD, ED ([facility], [facility], [facility] ONLY)  ? ? ?EKG ?None ? ?Radiology ?US PELVIC COMPLETE W TRANSVAGINAL AND TORSION R/O ? ?Result Date: 02/21/2022 ?CLINICAL DATA:  A 41 year old female presents with pelvic pain following hysterectomy. EXAM: TRANSABDOMINAL AND TRANSVAGINAL ULTRASOUND OF PELVIS DOPPLER ULTRASOUND OF OVARIES TECHNIQUE: Both transabdominal and transvaginal ultrasound examinations of the pelvis were performed. Transabdominal technique was performed for  global imaging of the pelvis including uterus, ovaries, adnexal regions, and pelvic cul-de-sac. It was necessary to proceed with endovaginal exam following the transabdominal exam to visualize the LEFT and RIGHT ovary. Color and duplex Doppler ultrasound was utilized to evaluate blood flow to the ovaries. COMPARISON:  CT evaluation March 10, 2021. FINDINGS: Uterus Surgically absent. Right ovary Measurements: 3.4 x 1.6 x 2.1 cm = volume: 5.8 mL. Normal appearance/no adnexal mass. Left ovary Measurements: 4.3 x 1.5 x 3.6 cm = volume: 12.5 mL. Pedunculated anechoic cystic lesion with mildly irregular margins but without signs of gross wall thickening or internal mural nodularity arises from the medial aspect of the LEFT ovary or immediately adjacent to the LEFT ovary. This measures 1.4 x 1.7 x 1.0 cm. Pulsed Doppler evaluation of both ovaries demonstrates normal appearance of  arterial and venous waveforms associated with the LEFT ovary. Limited assessment of the RIGHT ovary, faint otherwise normal appearing arteriovenous waveform with signs of clear venous waveform of the RIGHT ovary on the current study. Other findings No free fluid. IMPRESSION: Small cystic area with slightly irregular margins in the setting of pelvic pain favored to represent an involuting ovarian cyst or small para ovarian cyst with previous rupture. Could consider 6-8 week follow-up given current symptoms and mild wall thickening associated with this cystic lesion that is not located within the LEFT ovarian stroma. No signs of torsion with normal grayscale appearance of the ovaries otherwise and no free fluid. Note that the arterial waveform on the RIGHT is mildly limited, more likely due to technical factors with preserved venous flow and normal grayscale appearance. If there is worsening pain could consider short interval follow-up as warranted. Electronically Signed   By: Zetta Bills M.D.   On: 02/21/2022 11:47   ? ?Procedures ?Procedures   ? ? ?Medications Ordered in ED ?Medications - No data to display ? ?ED Course/ Medical Decision Making/ A&P ?  ?                        ?Medical Decision Making ?Amount and/or Complexity of Data Reviewed ?Lab

## 2022-02-22 ENCOUNTER — Inpatient Hospital Stay (HOSPITAL_COMMUNITY): Payer: Medicare Other | Admitting: Certified Registered Nurse Anesthetist

## 2022-02-22 ENCOUNTER — Ambulatory Visit (HOSPITAL_COMMUNITY)
Admission: RE | Admit: 2022-02-22 | Discharge: 2022-02-22 | Disposition: A | Discharge status: Home / Self Care | Payer: Medicare Other | Attending: Obstetrics & Gynecology | Admitting: Obstetrics & Gynecology

## 2022-02-22 ENCOUNTER — Other Ambulatory Visit: Payer: Self-pay

## 2022-02-22 ENCOUNTER — Encounter (HOSPITAL_COMMUNITY): Payer: Self-pay | Admitting: Obstetrics & Gynecology

## 2022-02-22 ENCOUNTER — Encounter (HOSPITAL_COMMUNITY)
Admission: RE | Disposition: A | Discharge status: Home / Self Care | Payer: Self-pay | Source: Home / Self Care | Attending: Obstetrics & Gynecology

## 2022-02-22 DIAGNOSIS — G40909 Epilepsy, unspecified, not intractable, without status epilepticus: Secondary | ICD-10-CM | POA: Insufficient documentation

## 2022-02-22 DIAGNOSIS — M797 Fibromyalgia: Secondary | ICD-10-CM | POA: Insufficient documentation

## 2022-02-22 DIAGNOSIS — E119 Type 2 diabetes mellitus without complications: Secondary | ICD-10-CM | POA: Insufficient documentation

## 2022-02-22 DIAGNOSIS — G4730 Sleep apnea, unspecified: Secondary | ICD-10-CM | POA: Insufficient documentation

## 2022-02-22 DIAGNOSIS — K219 Gastro-esophageal reflux disease without esophagitis: Secondary | ICD-10-CM | POA: Diagnosis not present

## 2022-02-22 DIAGNOSIS — Z79899 Other long term (current) drug therapy: Secondary | ICD-10-CM | POA: Insufficient documentation

## 2022-02-22 DIAGNOSIS — N736 Female pelvic peritoneal adhesions (postinfective): Secondary | ICD-10-CM | POA: Insufficient documentation

## 2022-02-22 DIAGNOSIS — I10 Essential (primary) hypertension: Secondary | ICD-10-CM | POA: Diagnosis not present

## 2022-02-22 DIAGNOSIS — F319 Bipolar disorder, unspecified: Secondary | ICD-10-CM | POA: Insufficient documentation

## 2022-02-22 DIAGNOSIS — N83202 Unspecified ovarian cyst, left side: Secondary | ICD-10-CM | POA: Diagnosis present

## 2022-02-22 HISTORY — PX: LAPAROSCOPY: SHX197

## 2022-02-22 HISTORY — PX: LAPAROSCOPIC LYSIS OF ADHESIONS: SHX5905

## 2022-02-22 LAB — GLUCOSE, CAPILLARY
Glucose-Capillary: 125 mg/dL — ABNORMAL HIGH (ref 70–99)
Glucose-Capillary: 133 mg/dL — ABNORMAL HIGH (ref 70–99)

## 2022-02-22 LAB — TYPE AND SCREEN
ABO/RH(D): A POS
Antibody Screen: NEGATIVE

## 2022-02-22 SURGERY — LAPAROSCOPY, DIAGNOSTIC
Anesthesia: General | Site: Abdomen

## 2022-02-22 MED ORDER — MIDAZOLAM HCL 5 MG/5ML IJ SOLN
INTRAMUSCULAR | Status: DC | PRN
  Administered 2022-02-22: 2 mg via INTRAVENOUS

## 2022-02-22 MED ORDER — PROPOFOL 10 MG/ML IV BOLUS
INTRAVENOUS | Status: DC | PRN
  Administered 2022-02-22: 200 mg via INTRAVENOUS

## 2022-02-22 MED ORDER — SUGAMMADEX SODIUM 200 MG/2ML IV SOLN
INTRAVENOUS | Status: DC | PRN
  Administered 2022-02-22: 200 mg via INTRAVENOUS

## 2022-02-22 MED ORDER — IBUPROFEN 800 MG PO TABS: 800.0000 mg | ORAL_TABLET | Freq: Three times a day (TID) | ORAL | 2 refills | Status: DC | PRN

## 2022-02-22 MED ORDER — HYDROCODONE-ACETAMINOPHEN 5-325 MG PO TABS: 1.0000 | ORAL_TABLET | Freq: Four times a day (QID) | ORAL | 0 refills | Status: DC | PRN

## 2022-02-22 MED ORDER — MIDAZOLAM HCL 2 MG/2ML IJ SOLN
INTRAMUSCULAR | Status: AC
  Filled 2022-02-22: qty 2

## 2022-02-22 MED ORDER — FENTANYL CITRATE (PF) 100 MCG/2ML IJ SOLN
INTRAMUSCULAR | Status: AC
  Filled 2022-02-22: qty 2

## 2022-02-22 MED ORDER — SODIUM CHLORIDE 0.9 % IR SOLN
Status: DC | PRN
  Administered 2022-02-22: 2000 mL

## 2022-02-22 MED ORDER — LIDOCAINE 2% (20 MG/ML) 5 ML SYRINGE
INTRAMUSCULAR | Status: AC
  Filled 2022-02-22: qty 5

## 2022-02-22 MED ORDER — SCOPOLAMINE 1 MG/3DAYS TD PT72
1.0000 | MEDICATED_PATCH | TRANSDERMAL | Status: DC
  Administered 2022-02-22: 1.5 mg via TRANSDERMAL

## 2022-02-22 MED ORDER — AMISULPRIDE (ANTIEMETIC) 5 MG/2ML IV SOLN: 10.0000 mg | Freq: Once | INTRAVENOUS | Status: DC | PRN

## 2022-02-22 MED ORDER — ONDANSETRON HCL 4 MG/2ML IJ SOLN
INTRAMUSCULAR | Status: AC
  Filled 2022-02-22: qty 4

## 2022-02-22 MED ORDER — FENTANYL CITRATE (PF) 100 MCG/2ML IJ SOLN
25.0000 ug | INTRAMUSCULAR | Status: DC | PRN
  Administered 2022-02-22: 25 ug via INTRAVENOUS
  Administered 2022-02-22: 50 ug via INTRAVENOUS

## 2022-02-22 MED ORDER — CHLORHEXIDINE GLUCONATE 0.12 % MT SOLN
OROMUCOSAL | Status: AC
  Administered 2022-02-22: 15 mL
  Filled 2022-02-22: qty 15

## 2022-02-22 MED ORDER — KETOROLAC TROMETHAMINE 30 MG/ML IJ SOLN
INTRAMUSCULAR | Status: AC
  Filled 2022-02-22: qty 1

## 2022-02-22 MED ORDER — FENTANYL CITRATE (PF) 250 MCG/5ML IJ SOLN
INTRAMUSCULAR | Status: AC
  Filled 2022-02-22: qty 5

## 2022-02-22 MED ORDER — DEXAMETHASONE SODIUM PHOSPHATE 10 MG/ML IJ SOLN
INTRAMUSCULAR | Status: DC | PRN
  Administered 2022-02-22: 5 mg via INTRAVENOUS

## 2022-02-22 MED ORDER — PROPOFOL 10 MG/ML IV BOLUS
INTRAVENOUS | Status: AC
  Filled 2022-02-22: qty 20

## 2022-02-22 MED ORDER — ROCURONIUM BROMIDE 10 MG/ML (PF) SYRINGE
PREFILLED_SYRINGE | INTRAVENOUS | Status: DC | PRN
Start: 2022-02-22 — End: 2022-02-22
  Administered 2022-02-22: 50 mg via INTRAVENOUS

## 2022-02-22 MED ORDER — SCOPOLAMINE 1 MG/3DAYS TD PT72
MEDICATED_PATCH | TRANSDERMAL | Status: AC
  Filled 2022-02-22: qty 1

## 2022-02-22 MED ORDER — OXYCODONE HCL 5 MG PO TABS
5.0000 mg | ORAL_TABLET | Freq: Once | ORAL | Status: AC | PRN
  Administered 2022-02-22: 5 mg via ORAL

## 2022-02-22 MED ORDER — ACETAMINOPHEN 500 MG PO TABS
1000.0000 mg | ORAL_TABLET | Freq: Once | ORAL | Status: AC
  Administered 2022-02-22: 1000 mg via ORAL

## 2022-02-22 MED ORDER — LACTATED RINGERS IV SOLN: INTRAVENOUS | Status: DC | PRN

## 2022-02-22 MED ORDER — KETOROLAC TROMETHAMINE 30 MG/ML IJ SOLN: 30.0000 mg | Freq: Once | INTRAMUSCULAR | Status: DC

## 2022-02-22 MED ORDER — METHYLENE BLUE 0.5 % INJ SOLN
INTRAVENOUS | Status: AC
  Filled 2022-02-22: qty 10

## 2022-02-22 MED ORDER — BUPIVACAINE HCL (PF) 0.25 % IJ SOLN
INTRAMUSCULAR | Status: AC
  Filled 2022-02-22: qty 30

## 2022-02-22 MED ORDER — ACETAMINOPHEN 500 MG PO TABS
ORAL_TABLET | ORAL | Status: AC
  Filled 2022-02-22: qty 2

## 2022-02-22 MED ORDER — FENTANYL CITRATE (PF) 250 MCG/5ML IJ SOLN
INTRAMUSCULAR | Status: DC | PRN
  Administered 2022-02-22: 100 ug via INTRAVENOUS
  Administered 2022-02-22 (×2): 50 ug via INTRAVENOUS

## 2022-02-22 MED ORDER — LIDOCAINE 2% (20 MG/ML) 5 ML SYRINGE
INTRAMUSCULAR | Status: DC | PRN
  Administered 2022-02-22: 60 mg via INTRAVENOUS

## 2022-02-22 MED ORDER — CLINDAMYCIN PHOSPHATE 900 MG/50ML IV SOLN
900.0000 mg | INTRAVENOUS | Status: AC
  Administered 2022-02-22: 900 mg via INTRAVENOUS
  Filled 2022-02-22: qty 50

## 2022-02-22 MED ORDER — ONDANSETRON HCL 4 MG/2ML IJ SOLN
INTRAMUSCULAR | Status: DC | PRN
  Administered 2022-02-22: 4 mg via INTRAVENOUS

## 2022-02-22 MED ORDER — GENTAMICIN SULFATE 40 MG/ML IJ SOLN
5.0000 mg/kg | INTRAVENOUS | Status: AC
  Administered 2022-02-22: 450 mg via INTRAVENOUS
  Filled 2022-02-22: qty 11.25

## 2022-02-22 MED ORDER — BUPIVACAINE HCL 0.25 % IJ SOLN
INTRAMUSCULAR | Status: DC | PRN
  Administered 2022-02-22: 20 mL

## 2022-02-22 MED ORDER — OXYCODONE HCL 5 MG PO TABS
ORAL_TABLET | ORAL | Status: AC
  Filled 2022-02-22: qty 1

## 2022-02-22 MED ORDER — HEMOSTATIC AGENTS (NO CHARGE) OPTIME
TOPICAL | Status: DC | PRN
  Administered 2022-02-22: 2 via TOPICAL

## 2022-02-22 MED ORDER — ROCURONIUM BROMIDE 10 MG/ML (PF) SYRINGE
PREFILLED_SYRINGE | INTRAVENOUS | Status: AC
  Filled 2022-02-22: qty 10

## 2022-02-22 MED ORDER — OXYCODONE HCL 5 MG/5ML PO SOLN: 5.0000 mg | Freq: Once | ORAL | Status: AC | PRN

## 2022-02-22 MED ORDER — POVIDONE-IODINE 10 % EX SWAB
2.0000 "application " | Freq: Once | CUTANEOUS | Status: AC
  Administered 2022-02-22: 2 via TOPICAL

## 2022-02-22 MED ORDER — DEXAMETHASONE SODIUM PHOSPHATE 10 MG/ML IJ SOLN
INTRAMUSCULAR | Status: AC
  Filled 2022-02-22: qty 2

## 2022-02-22 MED ORDER — KETOROLAC TROMETHAMINE 30 MG/ML IJ SOLN
INTRAMUSCULAR | Status: DC | PRN
  Administered 2022-02-22: 30 mg via INTRAVENOUS

## 2022-02-22 SURGICAL SUPPLY — 34 items
APPLICATOR ARISTA FLEXITIP XL (MISCELLANEOUS) ×2 IMPLANT
DEFOGGER SCOPE WARMER CLEARIFY (MISCELLANEOUS) ×4 IMPLANT
DERMABOND ADVANCED (GAUZE/BANDAGES/DRESSINGS) ×1
DERMABOND ADVANCED .7 DNX12 (GAUZE/BANDAGES/DRESSINGS) ×3 IMPLANT
DRAPE POUCH INSTRU U-SHP 10X18 (DRAPES) ×2 IMPLANT
DURAPREP 26ML APPLICATOR (WOUND CARE) ×4 IMPLANT
GLOVE SURG ENC MOIS LTX SZ6.5 (GLOVE) ×4 IMPLANT
GLOVE SURG UNDER POLY LF SZ7 (GLOVE) ×16 IMPLANT
GOWN STRL REUS W/ TWL LRG LVL3 (GOWN DISPOSABLE) ×6 IMPLANT
GOWN STRL REUS W/TWL LRG LVL3 (GOWN DISPOSABLE) ×8
HEMOSTAT ARISTA ABSORB 3G PWDR (HEMOSTASIS) ×4 IMPLANT
IRRIG SUCT STRYKERFLOW 2 WTIP (MISCELLANEOUS)
IRRIGATION SUCT STRKRFLW 2 WTP (MISCELLANEOUS) IMPLANT
KIT TURNOVER KIT B (KITS) ×4 IMPLANT
LIGASURE VESSEL 5MM BLUNT TIP (ELECTROSURGICAL) ×2 IMPLANT
NS IRRIG 1000ML POUR BTL (IV SOLUTION) ×4 IMPLANT
PACK LAPAROSCOPY BASIN (CUSTOM PROCEDURE TRAY) ×4 IMPLANT
PACK TRENDGUARD 450 HYBRID PRO (MISCELLANEOUS) ×1 IMPLANT
POUCH LAPAROSCOPIC INSTRUMENT (MISCELLANEOUS) ×4 IMPLANT
PROTECTOR NERVE ULNAR (MISCELLANEOUS) ×8 IMPLANT
SET TRI-LUMEN FLTR TB AIRSEAL (TUBING) ×2 IMPLANT
SET TUBE SMOKE EVAC HIGH FLOW (TUBING) ×2 IMPLANT
SLEEVE ENDOPATH XCEL 5M (ENDOMECHANICALS) ×4 IMPLANT
SUT MNCRL AB 4-0 PS2 18 (SUTURE) ×4 IMPLANT
SUT MON AB 2-0 CT1 27 (SUTURE) ×2 IMPLANT
SUT MON AB 4-0 PS1 27 (SUTURE) ×4 IMPLANT
SUT VICRYL 0 UR6 27IN ABS (SUTURE) ×4 IMPLANT
TOWEL GREEN STERILE FF (TOWEL DISPOSABLE) ×8 IMPLANT
TRAY FOLEY W/BAG SLVR 14FR (SET/KITS/TRAYS/PACK) ×4 IMPLANT
TRENDGUARD 450 HYBRID PRO PACK (MISCELLANEOUS) ×4
TROCAR PORT AIRSEAL 5X120 (TROCAR) ×2 IMPLANT
TROCAR XCEL NON-BLD 11X100MML (ENDOMECHANICALS) IMPLANT
TROCAR XCEL NON-BLD 5MMX100MML (ENDOMECHANICALS) ×4 IMPLANT
WARMER LAPAROSCOPE (MISCELLANEOUS) ×4 IMPLANT

## 2022-02-22 NOTE — Progress Notes (Signed)
Patient is aware to arrive at 1130 to entrance A.  Patient states that she has been NPO since midnight.  Patient also states that she has been out of her Toprol-XL and has not had it refilled.  Patient also states that does not know the last time she took her Victoza and she does not check blood sugars at home.   ?

## 2022-02-22 NOTE — Anesthesia Procedure Notes (Signed)
Procedure Name: Intubation ?Date/Time: 02/22/2022 2:10 PM ?Performed by: Colin Benton, CRNA ?Pre-anesthesia Checklist: Patient identified, Emergency Drugs available, Suction available and Patient being monitored ?Patient Re-evaluated:Patient Re-evaluated prior to induction ?Oxygen Delivery Method: Circle system utilized ?Preoxygenation: Pre-oxygenation with 100% oxygen ?Induction Type: IV induction ?Ventilation: Mask ventilation without difficulty ?Laryngoscope Size: Sabra Heck and 2 ?Grade View: Grade II ?Tube type: Oral ?Tube size: 7.0 mm ?Number of attempts: 1 ?Airway Equipment and Method: Stylet ?Placement Confirmation: ETT inserted through vocal cords under direct vision, positive ETCO2 and breath sounds checked- equal and bilateral ?Secured at: 22 cm ?Tube secured with: Tape ?Dental Injury: Teeth and Oropharynx as per pre-operative assessment  ? ? ? ? ?

## 2022-02-22 NOTE — Anesthesia Postprocedure Evaluation (Signed)
Anesthesia Post Note ? ?Patient: Samantha [name] ? ?Procedure(s) Performed: LAPAROSCOPY DIAGNOSTIC and Pelvic Washings (Left: Abdomen) ?LAPAROSCOPIC LYSIS OF ADHESIONS (Abdomen) ? ?  ? ?Patient location during evaluation: PACU ?Anesthesia Type: General ?Level of consciousness: awake ?Pain management: pain level controlled ?Vital Signs Assessment: post-procedure vital signs reviewed and stable ?Respiratory status: spontaneous breathing, nonlabored ventilation, respiratory function stable and patient connected to nasal cannula oxygen ?Cardiovascular status: blood pressure returned to baseline and stable ?Postop Assessment: no apparent nausea or vomiting ?Anesthetic complications: no ? ? ?No notable events documented. ? ?Last Vitals:  ?Vitals:  ? 02/22/22 1622 02/22/22 1637  ?BP: 114/60 128/70  ?Pulse: 68 81  ?Resp: 13 17  ?Temp:  36.6 ?C  ?SpO2: 98% 95%  ?  ?Last Pain:  ?Vitals:  ? 02/22/22 1637  ?TempSrc:   ?PainSc: 5   ? ? ?  ?  ?  ?  ?  ?  ? ?Greta Yung P Chisa Kushner ? ? ? ? ?

## 2022-02-22 NOTE — Anesthesia Preprocedure Evaluation (Addendum)
Anesthesia Evaluation  ?Patient identified by MRN, date of birth, ID band ?Patient awake ? ? ? ?Reviewed: ?Allergy & Precautions, NPO status , Patient's Chart, lab work & pertinent test results ? ?Airway ?Mallampati: II ? ?TM Distance: >3 FB ?Neck ROM: Full ? ? ? Dental ?no notable dental hx. ? ?  ?Pulmonary ?sleep apnea ,  ?  ?Pulmonary exam normal ?breath sounds clear to auscultation ? ? ? ? ? ? Cardiovascular ?hypertension, Pt. on home beta blockers ?Normal cardiovascular exam ?Rhythm:Regular Rate:Normal ? ?ECHO: ?Left ventricular ejection fraction, by estimation, is 60 to 65%. The left ventricle has normal function. The left ventricle has no regional wall motion abnormalities. Left ?ventricular diastolic parameters are consistent with Grade I diastolic dysfunction (impaired relaxation). The average left ventricular global longitudinal strain is -10.3 %. ?Right ventricular systolic function is normal. The right ventricular size is normal. ?Left atrial size was mildly dilated. ?The mitral valve is normal in structure. No evidence of mitral valve regurgitation. No evidence of mitral stenosis. ?The aortic valve was not well visualized. Unable to exclude bicuspid leaflets. Aortic ?valve regurgitation is trivial. No aortic stenosis is present. ?There is mild dilatation of the aortic root, measuring 42 mm. There is mild dilatation of the ascending aorta, measuring 40 mm. ?  ?Neuro/Psych ? Headaches, Seizures -,  PSYCHIATRIC DISORDERS Anxiety Depression Bipolar Disorder  Neuromuscular disease   ? GI/Hepatic ?negative GI ROS, Neg liver ROS,   ?Endo/Other  ?diabetes ? Renal/GU ?negative Renal ROS  ? ?  ?Musculoskeletal ? ?(+) Arthritis , Fibromyalgia - ? Abdominal ?  ?Peds ? Hematology ?negative hematology ROS ?(+)   ?Anesthesia Other Findings ?Left Ovarian Cyst ? Reproductive/Obstetrics ?S/p BTL ? ?  ? ? ? ? ? ? ? ? ? ? ? ? ? ?  ?  ? ? ? ? ? ? ? ?Anesthesia Physical ?Anesthesia  Plan ? ?ASA: 2 ? ?Anesthesia Plan: General  ? ?Post-op Pain Management:   ? ?Induction: Intravenous ? ?PONV Risk Score and Plan: 4 or greater and Ondansetron, Dexamethasone, Midazolam, Scopolamine patch - Pre-op and Treatment may vary due to age or medical condition ? ?Airway Management Planned: Oral ETT ? ?Additional Equipment:  ? ?Intra-op Plan:  ? ?Post-operative Plan: Extubation in OR ? ?Informed Consent: I have reviewed the patients History and Physical, chart, labs and discussed the procedure including the risks, benefits and alternatives for the proposed anesthesia with the patient or authorized representative who has indicated his/her understanding and acceptance.  ? ? ? ?Dental advisory given ? ?Plan Discussed with: CRNA ? ?Anesthesia Plan Comments:   ? ? ? ? ? ? ?Anesthesia Quick Evaluation ? ?

## 2022-02-22 NOTE — H&P (Signed)
MD GYN HISTORY AND PHYSICAL ? ?Admission Date: 02/22/2022 11:17 AM  ?Admit Diagnosis: Left ovarian cyst and worsening pelvic pain ?Patient Name: Samantha [name]        ?MRN#: [id number] ? ?Subjective:  ? ? Patient is a 41 y.o. female No obstetric history on file. presents for scheduled urgent laparoscopic unilateral oophorectomy secondary to worsening pain and left complex ovarian cyst.  ?Patient notes that her pain has worsened over the past 24 hours. She was seen in ED, and US shows normal left ovarian blood flow.  ? ?Pertinent Gynecological History: ?menarche : 41 YO;   LMP: Hysterectomy; Denies history of abnormal PAP smear.   Last PAP smear: 2018-normal Mammogram: 2023 normal ?OB History: G: 4;  P: 2-1-1-2; SVB 2008 (6 lbs. 9 oz.) and C-section 2010 ? ?Medical / Surgical History: ?Past medical history:  ?Past Medical History:  ?Diagnosis Date  ? Anemia   ? Angio-edema   ? Anxiety   ? Chest pain   ? a. 06/2020 MV: EF 55%, no ischemia or infarct.  No significant coronary calcium on attenuation correction CT.  ? Complication of anesthesia   ? slow to wake up 07/26/2021  ? COVID   ? sept 2021 flu like symptoms and cough.   lasted 5 days. 07/26/2021  ? Depression   ? rx presc but not taken  ? Diabetes mellitus without complication ([place])   ? Dilated aortic root ([place])   ? a. 05/2020 Echo: Ao root 75m; b. 08/2021 Echo: Ao root 429m Asc Ao 406m ? Fibromyalgia   ? GERD (gastroesophageal reflux disease)   ? no meds  ? H/o Sinus infection   ? Headache(784.0)   ? tension and with anxiety hx. complicated migraine 08/51/70/0174 Hepatic steatosis   ? History of kidney stones   ? MVP (mitral valve prolapse)   ? a. Pt told MVP present as child; b. 05/2020 Echo: EF 55-60%, no rwma, nl RV size/fxn, triv MR, mild AI. Ao root 87m64m. 05/2021 Echo: EF 60-65%, no rwma, GrI DD, nl RV fxn, mildly dil LA, no MR, Ao root 42mm10mc AO 40mm.43mPneumonia   ? hx  ? Seizures (HCC)  [place]. non epileptic events per epilepsy monitoring unit; b. last  seizure July 2015- does not convulse but has a sleep effect. pt states she doesnt have seizures, she has complicated migraines and causes her to have sleep like effective per pt. [facility] Rosali[facility] Rangehe left the practiced and doesnt have new dr. 07/27/2021  ? Sleep apnea   ? has cpap does not use  ? Wears glasses   ? 07/26/2021  ?  ?Past surgical history:  ?Past Surgical History:  ?Procedure Laterality Date  ? ABDOMINAL HYSTERECTOMY N/A 08/15/2014  ? Procedure: HYSTERECTOMY ABDOMINAL WITH CYSTOSCOPY;  Surgeon: Sundy Houchins Sanjuana Kava [facility];  Laterality: N/A;  ? BLADDER SUSPENSION N/A 07/30/2021  ? Procedure: TRANSVAGINAL TAPE (TVT) PROCEDURE;  Surgeon: Virdia Ziesmer, Sanjuana Kava [facility];  Laterality: N/A;  ? CESAREAN SECTION N/A   ? 2010 07/26/2021  ? CYSTOSCOPY N/A 07/30/2021  ? Procedure: CYSTOSCOPY;  Surgeon: Janica Eldred, Sanjuana Kava [facility];  Laterality: N/A;  ? CYSTOSCOPY/RETROGRADE/URETEROSCOPY Left 05/23/2019  ? Procedure: CYSTOSCOPY/RETROGRADE/URETEROSCOPY;  Surgeon: StoiofAbbie Sons Location: [facility];  Service: Urology;  Laterality: Left;  ?  KNEE ARTHROSCOPY    ? yr 2020 07/26/2021  ? SHOULDER ARTHROSCOPY WITH SUBACROMIAL DECOMPRESSION AND OPEN ROTATOR C Left 11/27/2015  ? Procedure: LEFT SHOULDER ARTHROSCOPY WITH SUBACROMIAL DECOMPRESSION, MINI OPEN ROTATOR CUFF REPAIR;  Surgeon: Netta Cedars, MD;  Location: [place];  Service: Orthopedics;  Laterality: Left;  ? TUBAL LIGATION    ? ?Family History:  ?Family History  ?Problem Relation Age of Onset  ? Diabetes Mother   ? Hypertension Mother   ? Healthy Father   ? Breast cancer Maternal Grandmother   ? Cancer Maternal Grandfather   ?     brain, lung, liver  ? Other Paternal Grandmother   ? Heart attack Paternal Grandfather   ?  ?Social History:  reports that she has never smoked. She has never used  smokeless tobacco. She reports that she does not drink alcohol and does not use drugs. ? ?Allergies: ?Allergies  ?Allergen Reactions  ? Sulfa Antibiotics Anaphylaxis and Swelling  ? Topiramate Swelling  ?  Tongue  ? Duloxetine Hcl Other (See Comments)  ?  Weight gain, feels "crazy"  ? Ibuprofen   ?  Due to taking Elavil, causes counter headaches when she takes Ibuprofen  ? Metformin And Related Other (See Comments)  ?  GI Upset  ? Penicillins Rash  ?  Has patient had a PCN reaction causing immediate rash, facial/tongue/throat swelling, SOB or lightheadedness with hypotension: Yes ?Has patient had a PCN reaction causing severe rash involving mucus membranes or skin necrosis: No ?Has patient had a PCN reaction that required hospitalization No ?Has patient had a PCN reaction occurring within the last 10 years: No ?If all of the above answers are "NO", then may proceed with Cephalosporin use. ?  ? ?  ?Current Medications at time of admission:  ?Prior to Admission medications   ?Medication Sig Start Date End Date Taking? Authorizing Provider  ?metoprolol succinate (TOPROL-XL) 25 MG 24 hr tablet TAKE 1/2 TABLET BY MOUTH DAILY 07/27/21  Yes End, Harrell Gave, MD  ?valACYclovir (VALTREX) 500 MG tablet Take 500 mg by mouth daily as needed (cold sores).    Yes [provider]  ?Vitamin D, Ergocalciferol, (DRISDOL) 1.25 MG (50000 UT) CAPS capsule Take one tablet wkly ?Patient taking differently: Take 50,000 Units by mouth every 7 (seven) days. Take one tablet wkly 10/15/19  Yes Opalski, Neoma Laming, DO  ?chlorhexidine (HIBICLENS) 4 % external liquid Apply topically daily as needed. 11/02/21   Volney American, PA-C  ?FLUoxetine (PROZAC) 20 MG capsule Take 1 capsule (20 mg total) by mouth daily. 01/26/22   Lorrene Reid, PA-C  ?HYDROcodone-acetaminophen (NORCO/VICODIN) 5-325 MG tablet Take 1 tablet by mouth every 6 (six) hours as needed for moderate pain. 11/02/21   Volney American, PA-C  ?liraglutide (VICTOZA)  18 MG/3ML SOPN Inject 1.2 mg into the skin daily.    [provider]  ?methylPREDNISolone (MEDROL DOSEPAK) 4 MG TBPK tablet Take as directed on package. 01/06/22   Lorrene Reid, PA-C  ?mupirocin ointment (BACTROBAN) 2 % Apply 1 application topically 2 (two) times daily. 11/02/21   Volney American, PA-C  ?nitrofurantoin, macrocrystal-monohydrate, (MACROBID) 100 MG capsule Take 1 capsule (100 mg total) by mouth 2 (two) times daily. 02/21/22   Khatri, Hina, PA-C  ?terconazole (TERAZOL 7) 0.4 % vaginal cream terconazole 0.4 % vaginal cream ? INSERT 1 APPLICATORFUL EVERY DAY BY VAGINAL ROUTE AS DIRECTED FOR 7 DAYS.    [provider]  ? ? ?Review of Systems: ?Constitutional: Negative   ?HENT: Negative   ?  Eyes: Negative   ?Respiratory: Negative   ?Cardiovascular: Negative   ?Gastrointestinal: Negative  ?Genitourinary: neg  ?Musculoskeletal: Negative   ?Skin: Negative   ?Neurological: Negative   ?Endo/Heme/Allergies: Negative   ?Psychiatric/Behavioral: Negative  ?  ?  ?Objective:  ? ?  ?Physical Exam: ?VS: Blood pressure (!) 146/96, pulse 82, temperature 98.4 ?F (36.9 ?C), temperature source Oral, resp. rate 18, height '5\' 9"'$  (1.753 m), weight 90.3 kg, last menstrual period 08/15/2014, SpO2 98 %. ?Physical Exam ?General:   alert, cooperative, and no distress  ?Skin:   normal  ?Lungs:   clear to auscultation bilaterally  ?Abdomen:  Soft tender to deep palpation, no rebound or guarding  ?Pelvis:     ?Adnexa:  Left adnexal tenderness  ? ?Labs / Imaging: ?Results for orders placed or performed during the hospital encounter of 02/22/22 (from the past 24 hour(s))  ?Glucose, capillary     Status: Abnormal  ? Collection Time: 02/22/22 12:20 PM  ?Result Value Ref Range  ? Glucose-Capillary 125 (H) 70 - 99 mg/dL  ? ?US PELVIC COMPLETE W TRANSVAGINAL AND TORSION R/O ? ?Result Date: 02/21/2022 ?CLINICAL DATA:  A 41 year old female presents with pelvic pain following hysterectomy. EXAM: TRANSABDOMINAL AND  TRANSVAGINAL ULTRASOUND OF PELVIS DOPPLER ULTRASOUND OF OVARIES TECHNIQUE: Both transabdominal and transvaginal ultrasound examinations of the pelvis were performed. Transabdominal technique was performed for g

## 2022-02-22 NOTE — Transfer of Care (Signed)
Immediate Anesthesia Transfer of Care Note ? ?Patient: Samantha [name] ? ?Procedure(s) Performed: LAPAROSCOPIC OOPHORECTOMY (Left) ? ?Patient Location: PACU ? ?Anesthesia Type:General ? ?Level of Consciousness: drowsy ? ?Airway & Oxygen Therapy: Patient Spontanous Breathing and Patient connected to face mask oxygen ? ?Post-op Assessment: Report given to RN and Post -op Vital signs reviewed and stable ? ?Post vital signs: Reviewed and stable ? ?Last Vitals:  ?Vitals Value Taken Time  ?BP 138/69 02/22/22 1537  ?Temp    ?Pulse 70 02/22/22 1538  ?Resp 16 02/22/22 1538  ?SpO2 96 % 02/22/22 1538  ?Vitals shown include unvalidated device data. ? ?Last Pain:  ?Vitals:  ? 02/22/22 1233  ?TempSrc:   ?PainSc: 5   ?   ? ?Patients Stated Pain Goal: 0 (02/22/22 1233) ? ?Complications: No notable events documented. ?

## 2022-02-23 ENCOUNTER — Encounter (HOSPITAL_COMMUNITY): Payer: Self-pay | Admitting: Obstetrics & Gynecology

## 2022-02-23 DIAGNOSIS — K219 Gastro-esophageal reflux disease without esophagitis: Secondary | ICD-10-CM | POA: Diagnosis not present

## 2022-02-23 DIAGNOSIS — G4730 Sleep apnea, unspecified: Secondary | ICD-10-CM | POA: Diagnosis not present

## 2022-02-23 DIAGNOSIS — Z79899 Other long term (current) drug therapy: Secondary | ICD-10-CM | POA: Diagnosis not present

## 2022-02-23 DIAGNOSIS — E119 Type 2 diabetes mellitus without complications: Secondary | ICD-10-CM | POA: Diagnosis not present

## 2022-02-23 DIAGNOSIS — M797 Fibromyalgia: Secondary | ICD-10-CM | POA: Diagnosis not present

## 2022-02-23 DIAGNOSIS — N736 Female pelvic peritoneal adhesions (postinfective): Secondary | ICD-10-CM | POA: Diagnosis not present

## 2022-02-23 DIAGNOSIS — I10 Essential (primary) hypertension: Secondary | ICD-10-CM | POA: Diagnosis not present

## 2022-02-23 LAB — CYTOLOGY - NON PAP

## 2022-02-23 MED ORDER — LACTATED RINGERS IV SOLN: INTRAVENOUS | Status: DC

## 2022-02-23 MED ORDER — GABAPENTIN 100 MG PO CAPS: 100.0000 mg | ORAL_CAPSULE | Freq: Three times a day (TID) | ORAL | Status: DC

## 2022-02-23 MED ORDER — SIMETHICONE 80 MG PO CHEW: 80.0000 mg | CHEWABLE_TABLET | Freq: Four times a day (QID) | ORAL | Status: DC | PRN

## 2022-02-23 MED ORDER — ONDANSETRON HCL 4 MG PO TABS: 4.0000 mg | ORAL_TABLET | Freq: Four times a day (QID) | ORAL | Status: DC | PRN

## 2022-02-23 MED ORDER — PANTOPRAZOLE SODIUM 40 MG PO TBEC: 40.0000 mg | DELAYED_RELEASE_TABLET | Freq: Every day | ORAL | Status: DC

## 2022-02-23 MED ORDER — ONDANSETRON HCL 4 MG/2ML IJ SOLN: 4.0000 mg | Freq: Four times a day (QID) | INTRAMUSCULAR | Status: DC | PRN

## 2022-02-23 MED ORDER — ACETAMINOPHEN 500 MG PO TABS: 1000.0000 mg | ORAL_TABLET | Freq: Four times a day (QID) | ORAL | Status: DC

## 2022-02-23 MED ORDER — HYDROMORPHONE HCL 1 MG/ML IJ SOLN: 0.2000 mg | INTRAMUSCULAR | Status: DC | PRN

## 2022-02-23 MED ORDER — MENTHOL 3 MG MT LOZG: 1.0000 | LOZENGE | OROMUCOSAL | Status: DC | PRN

## 2022-02-23 MED ORDER — OXYCODONE HCL 5 MG PO TABS: 5.0000 mg | ORAL_TABLET | ORAL | Status: DC | PRN

## 2022-02-23 NOTE — Op Note (Signed)
OPERATIVE NOTE ? ?[name] ? ?DOB:    1981/10/20 ? ?MRN:    [id number] ? ?CSN:    [id number] ? ?Date of Surgery:  02/22/2022 ? ?Preop Diagnosis: Left Ovarian Cyst  ? ?Postop Diagnosis: DENSE PELVIC ADHESIONS  ? ?Procedure:  Diagnostic laparoscopy, Attempted Laparoscopic oophorectomy, adhesiolysis; pelvic washings ? ?Anesthesia: General  ? ?Surgeon: ?Mady Haagensen. Alwyn Pea, M.D. ? ?Assistant: ?None ? ?Findings:Exam under anesthesia: External vulva normal vaginal vault: small area of exposed mesh from mid urethral sling with decreased ruggae and moisture, cuff intact, uterus and cervix absent adnexa: no palpable masses. Laparoscopy: Dense omental adhesions from the anterior abdominal wall to the large bowel. Dense pelvic adhesions bilaterally.  Right ovary adhered to the right pelvic side wall. The left ovary adhered via dense adhesions to the posterior cul de sac and bowel. No apparent ovarian cyst notable.  ? ?Pathology: Pelvic washings ? ?Fluids: 600 mL ? ?UOP:  200 CC OF clear urine via foley catheter during case removed in OR ? ?EBL:  10 ML ? ?Complications: None ? ? ?Procedure: ?The patient was taken to the operating room after the risks, benefits, alternatives, complications, treatment options, and expected outcomes were discussed with the patient. The patient verbalized understanding, the patient concurred with the proposed plan and consent signed and witnessed. The patient was taken to the Operating Room, identified as Vernisha Bacote and the procedure verified and a Time Out was held and the above information confirmed. ? ?Patient placed in the dorsal lithotomy position using Allen stirrups and then placed under general anesthesia.   An examination under anesthesia was performed.  The abdomen was prepped with ChloraPrep. The perineum and vagina were prepped with multiple layers of Betadine.  The bladder was catheterized wit a foley. The abdomen and perineum were draped as a sterile field. .  A sponge stick was placed in  the vagina. The surgeon re-  gloved.   ? ?A 10 mm midline infra-umbilical incision was made after infiltration with 0.25% Marcaine. Using direct entry an 11 mm Excel Optiview port attached to the 0-degree 10 mm operative laparoscope was entered into the tented up abdomen under direct video visualization.  Confirmation of placement was made with the laparoscope and pneumoperitoneum was made with approximately 2L C02 gas.  The patient was placed in steep Trendelenburg and a complete abdominal and pelvic survey was performed with findings noted above.Marcaine injected in the RLQ and Right upper abdominal quadrant and 2 5 mm incisions were made and 2 5 mm trocar advanced into the intraabdominal cavity. This was performed under direct video visualization. There was no noted injury with placement of any trochars. Pelvic washings were performed to be sent for cytology.  ? ?Blunt dissection of pelvic adhesive disease was done using the [place] endoscopic too. The adhesions were also removed via cautery using the Ligasure removing adhesions from the large intestines to the anterior abdominal wall and from the left pelvic side wall. The dissection site was made hemostatic using the Ligasure. ?Attempt was made to free the left ovary from the lower posterior cul de sac removing the overlying adhesions. The top of the ovarian cortex was visible, however the infundibulopelvic ligament could not be safely identified to remove the ovary safely. Decision was made to stop the procedure at this juncture.  ?Arista powder was sprayed over the pelvis and areas where the adhesions were dissected down.  ? ?All trochars were then removed from the peritoneal cavity under direct visualization as the CO2 was  allowed to escape. The supra-umbilical incision was closed with the fascial sutures in a figure-of-eight fashion. All skin incisions were closed with Dermabond.  ? ?The sponge stick was removed as well as the Foley catheter. The area of  mesh removal was repaired with running locked suture of 2-0 Monocryl re-approximating the vaginal wall over the mesh.  ? ?The patient was awakened from general anesthesia and taken to the recovery room in satisfactory condition having tolerated the procedure well with sponge and instrument counts correct. It was anticipated that she would be discharged home later that afternoon. ? ?Sanjuana Kava  ?

## 2022-04-05 DIAGNOSIS — N644 Mastodynia: Secondary | ICD-10-CM | POA: Diagnosis not present

## 2022-04-13 ENCOUNTER — Telehealth: Payer: Self-pay | Admitting: *Deleted

## 2022-04-13 NOTE — Telephone Encounter (Signed)
Spoke with the Samantha [name] and scheduled a new Samantha [name] with Dr Berline Lopes on *6/1 at 9 am. Samantha [name] given an arrival time of 830 am. Samantha [name] also given the address and phone number for the clinic; along with the policy for mask and visitors.   ?

## 2022-04-25 DIAGNOSIS — R928 Other abnormal and inconclusive findings on diagnostic imaging of breast: Secondary | ICD-10-CM | POA: Diagnosis not present

## 2022-04-25 DIAGNOSIS — N644 Mastodynia: Secondary | ICD-10-CM | POA: Diagnosis not present

## 2022-04-25 LAB — HM MAMMOGRAPHY

## 2022-04-26 ENCOUNTER — Encounter: Payer: Self-pay | Admitting: Gynecologic Oncology

## 2022-05-05 ENCOUNTER — Encounter: Payer: Self-pay | Admitting: Gynecologic Oncology

## 2022-05-05 ENCOUNTER — Other Ambulatory Visit: Payer: Self-pay

## 2022-05-05 ENCOUNTER — Inpatient Hospital Stay: Payer: Medicare Other

## 2022-05-05 ENCOUNTER — Inpatient Hospital Stay: Payer: Medicare Other | Attending: Gynecologic Oncology | Admitting: Gynecologic Oncology

## 2022-05-05 VITALS — BP 129/71 | HR 77 | Temp 98.6°F | Resp 16 | Ht 69.0 in | Wt 200.4 lb

## 2022-05-05 DIAGNOSIS — F32A Depression, unspecified: Secondary | ICD-10-CM | POA: Diagnosis not present

## 2022-05-05 DIAGNOSIS — N951 Menopausal and female climacteric states: Secondary | ICD-10-CM

## 2022-05-05 DIAGNOSIS — R569 Unspecified convulsions: Secondary | ICD-10-CM | POA: Diagnosis not present

## 2022-05-05 DIAGNOSIS — N83202 Unspecified ovarian cyst, left side: Secondary | ICD-10-CM

## 2022-05-05 DIAGNOSIS — I341 Nonrheumatic mitral (valve) prolapse: Secondary | ICD-10-CM | POA: Diagnosis not present

## 2022-05-05 DIAGNOSIS — F419 Anxiety disorder, unspecified: Secondary | ICD-10-CM | POA: Diagnosis not present

## 2022-05-05 DIAGNOSIS — M797 Fibromyalgia: Secondary | ICD-10-CM | POA: Insufficient documentation

## 2022-05-05 DIAGNOSIS — E119 Type 2 diabetes mellitus without complications: Secondary | ICD-10-CM | POA: Diagnosis not present

## 2022-05-05 DIAGNOSIS — Z7985 Long-term (current) use of injectable non-insulin antidiabetic drugs: Secondary | ICD-10-CM | POA: Diagnosis not present

## 2022-05-05 DIAGNOSIS — N736 Female pelvic peritoneal adhesions (postinfective): Secondary | ICD-10-CM | POA: Diagnosis not present

## 2022-05-05 DIAGNOSIS — Z791 Long term (current) use of non-steroidal anti-inflammatories (NSAID): Secondary | ICD-10-CM | POA: Insufficient documentation

## 2022-05-05 DIAGNOSIS — Z90710 Acquired absence of both cervix and uterus: Secondary | ICD-10-CM | POA: Diagnosis not present

## 2022-05-05 DIAGNOSIS — Z79899 Other long term (current) drug therapy: Secondary | ICD-10-CM | POA: Insufficient documentation

## 2022-05-05 DIAGNOSIS — Z79891 Long term (current) use of opiate analgesic: Secondary | ICD-10-CM | POA: Diagnosis not present

## 2022-05-05 DIAGNOSIS — R102 Pelvic and perineal pain: Secondary | ICD-10-CM | POA: Insufficient documentation

## 2022-05-05 DIAGNOSIS — R194 Change in bowel habit: Secondary | ICD-10-CM

## 2022-05-05 DIAGNOSIS — G4730 Sleep apnea, unspecified: Secondary | ICD-10-CM | POA: Diagnosis not present

## 2022-05-05 DIAGNOSIS — Z8616 Personal history of COVID-19: Secondary | ICD-10-CM | POA: Insufficient documentation

## 2022-05-05 DIAGNOSIS — R1032 Left lower quadrant pain: Secondary | ICD-10-CM

## 2022-05-05 DIAGNOSIS — K219 Gastro-esophageal reflux disease without esophagitis: Secondary | ICD-10-CM | POA: Insufficient documentation

## 2022-05-05 DIAGNOSIS — Z9079 Acquired absence of other genital organ(s): Secondary | ICD-10-CM | POA: Insufficient documentation

## 2022-05-05 NOTE — Patient Instructions (Addendum)
It was very nice to meet you today.  Today we discussed your most recent ultrasounds as well as the findings at the time of your surgery.  Based on the ultrasound you had in the hospital in March, you had a very small cyst on your left ovary, that measured less than 2 cm.  This is very unlikely to be causing your pain.  We also reviewed that given the amount of scar tissue involving both ovaries, I do not think it would be possible for the ovary to be twisting on its blood supply.  Your description of the pain and how constant it has been would also not support that being the cause of your symptoms.  Based on your hot flashes, I suspect that you are near menopausal or menopausal.  I like to get some hormone levels today to help confirm this.  If you appear to be premenopausal, I think it may be worthwhile to put you on some sort of hormonal suppression of your ovaries to see if it has any impact on your pain.  This could either be with her progesterone pill or injection.  Given your abnormal bowel function, my suspicion is that your pain may be related to your GI tract.  I would like to get you back into see your gastroenterologist.  We also discussed the possibility that your pelvic pain may be related to scar tissue from your surgery.  While we sometimes do surgery for scar tissue, this comes with increased risk of damage to other organs at the time of surgery and also the risk that there can be development of additional scar tissue after surgery.  I would also like to get you scheduled for repeat ultrasound.  I do not feel a mass on either ovary on your exam today, but given your worsening pain and that it has been about 2-1/2 months since her last ultrasound, I would like to see if there appears to be any cyst or masses on the ovary.  Depending on the results of your ultrasound, we may discuss getting an MRI to get a more detailed look at the pelvis.  If you need anything or have any questions,  please call the office at [phone].

## 2022-05-05 NOTE — Progress Notes (Unsigned)
GYNECOLOGIC ONCOLOGY NEW PATIENT CONSULTATION   Patient Name: Samantha [name]  Patient Age: 41 y.o. Date of Service: 05/05/22 Referring Provider: Dr. Waymon Amato  Primary Care Provider: Lorrene Reid, PA-C Consulting Provider: Jeral Pinch, MD   Assessment/Plan:  41 year old female, suspected perimenopausal, with known pelvic adhesions from prior surgeries with approximately 9-monthhistory of pelvic pain in the setting of an ovarian cyst.  I reviewed with the patient her more recent ultrasounds.  Her ultrasound here in March shows a small, less than 2 cm cystic mass on the left ovary.  We discussed that a mass or cyst of this size is very unlikely to be the cause of significant pain.  Additionally, the time of her surgery in March, no significant cystic process on either ovary was identified.  Given findings at the time of her recent laparoscopy, we know that she has significant abdominal and pelvic adhesive disease from prior surgeries.  Given this, I think that the risk of ovarian torsion is essentially zero, as both ovaries are adherent to surrounding pelvic structures.  Additionally, the length of her symptoms does not fit such a diagnosis.  The patient endorses significant abnormalities in terms of her bowel function, and I suspect that her pain is either related to a GI source or pelvic adhesions.  To help rule out an ovarian source of her pain, I suggested that we get some blood work to assess whether she is perimenopausal or menopausal.  If she appears premenopausal or perimenopausal, despite her worsening hot flashes, I think it may be worthwhile to place her on some hormone suppression with either progesterone only pill or Lupron.  I also recommend repeating a pelvic ultrasound to assess her left adnexa.  If there appears to be question of a complex cyst or an enlarging mass, then we will plan for either revisiting surgical intervention or proceeding with additional diagnostic imaging  with a pelvic MRI.  We also reviewed the difficulty with surgery in the setting of pain suspected to be from adhesions.  We discussed the increased morbidity in the setting of severe adhesions as well as the possibility of redevelopment of adhesive disease after such a surgery.  The patient has seen gastroenterology although it appears not in the last 7 years.  [facility] also asked the patient to call to help facilitate getting her in.  We discussed the utility of pelvic floor physical therapy.  On exam, she does not have significant pelvic floor pain.  We will hold off on referral at this time.  A copy of this note was sent to the patient's referring provider.   65 minutes of total time was spent for this patient encounter, including preparation, face-to-face counseling with the patient and coordination of care, and documentation of the encounter.   KJeral Pinch MD  Division of Gynecologic Oncology  Department of Obstetrics and Gynecology  [facility] ___________________________________________  Chief Complaint: Chief Complaint  Patient presents with   Cyst of left ovary    History of Present Illness:  Samantha BOGDANis a 41y.o. y.o. female who is seen in consultation at the request of Dr. KAlesia Richardsfor an evaluation of left-sided pelvic pain, ovarian cyst.  Treatment history: 09/2014: Patient underwent total abdominal hysterectomy and bilateral salpingectomy for abnormal uterine bleeding.  Findings at the time of her surgery included normal uterus and bilateral tubes.  Left ovary with a simple appearing cyst, cyst  wall excised at the time of surgery. 09/2020: 4 mm right UVJ calculus causing mild hydroureteronephrosis.  Symmetric and normal appearing adnexa. 03/2021: CT of the abdomen and pelvis shows nonobstructing left upper pole calculus.  Mixed low and intermediate attenuation fluid in the pelvis with a 2.4 cm  dense fluid collection thought to perhaps represent a ruptured left ovarian cyst. 07/2021: Patient underwent TVT and cystoscopy for stress urinary incontinence. 02/17/22: Pelvic ultrasound exam at [place] she has right measuring measures 3.33 x 2.65 x 1.88 cm.  Left adnexa measures 5.68 x 5.44 x 4.13 cm.  Left ovary noted to contain cystic structure with internal echoes measuring up to 4.6 cm, likely hemorrhagic cyst.  Normal venous and arterial waveforms.  No free fluid. 02/21/22: Presented to the emergency department with acute worsening of the left-sided pelvic pain that she had endorsed for several months.  Pelvic ultrasound at that time showed left ovary measures 4.3 x 1.5 x 3.6 cm with a pedunculated anechoic cystic lesion with mildly irregular margins but without signs of gross wall thickening or internal mural nodularity.  This area measures 0.4 x 1.7 x 1 cm.  Right ovary measures up to 3.4 cm without masses.  No free fluid. 02/23/22: Patient was taken to the operating room for planned left oophorectomy after she presented with worsening pelvic pain.  She underwent diagnostic laparoscopy and adhesiolysis.  Findings were notable for dense omental adhesions from the anterior abdominal wall to the large bowel, pelvic adhesions, right ovary adherent to the right pelvic sidewall, and left ovary adherent with dense adhesions to the posterior cul-de-sac and bowel.  No apparent ovarian cyst appreciated.  Today, patient notes an approximate 84-monthhistory of left-sided pelvic pain.  She does not remember exactly when it started, but given her history of prior ovarian cyst and similar pain, thought that this was likely what was going on.  She scheduled a visit to see her OB/GYN in March of this year.  Since her surgery on 3/22, the patient notes worsening pelvic pain that she now describes as constant.  She gets some minor relief if she lays down on her left side.  She describes the pain in her left  pelvis as "feeling like she is going to explode", being bloated, and sometimes like she is "burning" as if someone is "lighting a match" deep on the inside.  She has occasional nausea, denies any emesis.  She has been using hydrocodone as needed for pain that she had from her prior bladder surgery, otherwise uses Tylenol.  [facility].  She underwent colonoscopy and endoscopy at that time.  She notes having resolution of her symptoms without any intervention.  She now describes her bowel function as "going constantly" because she feels like she is bloated.  She often has a normal amount of stool, which she describes as not being loose or diarrhea, but will go multiple times during the day.  Already this morning, she has had 3 bowel movements.  She has some urinary frequency but attributes this to drinking more water.  She endorses a good appetite, is working on eating a better diet.  She gets instant diarrhea when she eats salads.  She has a history of hot flashes, which worsened this year.  These are more bothersome at night.  PAST MEDICAL HISTORY:  Past Medical History:  Diagnosis Date   Anemia    Angio-edema    Anxiety  Chest pain    a. 06/2020 MV: EF 55%, no ischemia or infarct.  No significant coronary calcium on attenuation correction CT.   Complication of anesthesia    slow to wake up 07/26/2021   COVID    sept 2021 flu like symptoms and cough.   lasted 5 days. 07/26/2021   Depression    rx presc but not taken   Diabetes mellitus without complication ([place])    Dilated aortic root ([place])    a. 05/2020 Echo: Ao root 68m; b. 08/2021 Echo: Ao root 429m Asc Ao 4040m  Fibromyalgia    GERD (gastroesophageal reflux disease)    no meds   H/o Sinus infection    Headache(784.0)    tension and with anxiety hx. complicated migraine 08/44/81/8563Hepatic steatosis    History of kidney stones    MVP (mitral valve prolapse)    a. Pt told  MVP present as child; b. 05/2020 Echo: EF 55-60%, no rwma, nl RV size/fxn, triv MR, mild AI. Ao root 56m82m. 05/2021 Echo: EF 60-65%, no rwma, GrI DD, nl RV fxn, mildly dil LA, no MR, Ao root 42mm60mc AO 40mm.94mneumonia    hx   Seizures (HCC)  [place]. non epileptic events per epilepsy monitoring unit; b. last seizure July 2015- does not convulse but has a sleep effect. pt states she doesnt have seizures, she has complicated migraines and causes her to have sleep like effective per pt. [facility] Rosali[facility]is Rangehe left the practiced and doesnt have new dr. 07/27/2021   Sleep apnea    has cpap does not use   Wears glasses    07/26/2021     PAST SURGICAL HISTORY:  Past Surgical History:  Procedure Laterality Date   ABDOMINAL HYSTERECTOMY N/A 08/15/2014   Procedure: HYSTERECTOMY ABDOMINAL WITH CYSTOSCOPY;  Surgeon: Walda Sanjuana Kava [facility] Service: Gynecology;  Laterality: N/A;   BLADDER SUSPENSION N/A 07/30/2021   Procedure: TRANSVAGINAL TAPE (TVT) PROCEDURE;  Surgeon: Pinn, Sanjuana Kava [place] [facility] [place];  Laterality: N/A;   CESAREAN SECTION N/A    2010 07/26/2021   CYSTOSCOPY N/A 07/30/2021   Procedure: CYSTOSCOPY;  Surgeon: Pinn, Sanjuana Kava [facility];  Laterality: N/A;   CYSTOSCOPY/RETROGRADE/URETEROSCOPY Left 05/23/2019   Procedure: CYSTOSCOPY/RETROGRADE/URETEROSCOPY;  Surgeon: StoiofAbbie Sons Location: [facility];  Service: Urology;  Laterality: Left;   KNEE ARTHROSCOPY     yr 2020 07/26/2021   LAPAROSCOPIC LYSIS OF ADHESIONS N/A 02/22/2022   Procedure: LAPAROSCOPIC LYSIS OF ADHESIONS;  Surgeon: Pinn, Sanjuana Kava [facility];  Laterality: N/A;   LAPAROSCOPY Left 02/22/2022   Procedure: LAPAROSCOPY DIAGNOSTIC and Pelvic Washings;  Surgeon: Pinn, Sanjuana Kava [facility] [place]cology;  Laterality: Left;    SHOULDER ARTHROSCOPY WITH SUBACROMIAL DECOMPRESSION AND OPEN ROTATOR C Left 11/27/2015   Procedure: LEFT SHOULDER ARTHROSCOPY WITH SUBACROMIAL DECOMPRESSION, MINI OPEN ROTATOR CUFF REPAIR;  Surgeon: Steve Netta [place]ars [facility] [place]ics;  Laterality: Left;   TUBAL LIGATION      OB/GYN HISTORY:  OB History  Gravida Para Term Preterm AB Living  '4 2       2  '$ SAB IAB Ectopic Multiple Live Births               # Outcome  Date GA Lbr Len/2nd Weight Sex Delivery Anes PTL Lv  4 Gravida           3 Gravida           2 Para           1 Para             Patient's last menstrual period was 08/15/2014.  Age at menarche: 59 Age at menopause: Menses stopped at 74 at the time of her hysterectomy, has been having hot flashes for the last 1-2 years Hx of HRT: Denies Hx of STDs: Yes Last pap: 2021 per patient History of abnormal pap smears: no  SCREENING STUDIES:  Last mammogram: 2022  Last colonoscopy: 2016  MEDICATIONS: Outpatient Encounter Medications as of 05/05/2022  Medication Sig   cetirizine (ZYRTEC) 10 MG tablet Take 10 mg by mouth daily.   diclofenac Sodium (VOLTAREN) 1 % GEL diclofenac 1 % topical gel  APPLY 2 TO 4 GRAMS TO THE AFFECTED AREA(S) BY TOPICAL ROUTE UP TO 4 TIMES PER DAY AS NEEDED. MAXIMUM DOSE PER AREA IS 16 G/DAY. MAXIMUM TOTAL BODY DOSE IS 62 G/DAY.   FLUoxetine (PROZAC) 20 MG capsule Take 1 capsule (20 mg total) by mouth daily.   HYDROcodone-acetaminophen (NORCO/VICODIN) 5-325 MG tablet Take 1 tablet by mouth every 6 (six) hours as needed for moderate pain.   metoprolol succinate (TOPROL-XL) 25 MG 24 hr tablet TAKE 1/2 TABLET BY MOUTH DAILY   valACYclovir (VALTREX) 500 MG tablet Take 500 mg by mouth daily as needed (cold sores).    Vitamin D, Ergocalciferol, (DRISDOL) 1.25 MG (50000 UT) CAPS capsule Take one tablet wkly (Patient taking differently: Take 50,000 Units by mouth every 7 (seven) days. Take one tablet wkly)   liraglutide (VICTOZA) 18  MG/3ML SOPN Inject 1.2 mg into the skin daily. (Patient not taking: Reported on 04/26/2022)   [DISCONTINUED] ibuprofen (ADVIL) 800 MG tablet Take 1 tablet (800 mg total) by mouth every 8 (eight) hours as needed.   [DISCONTINUED] nitrofurantoin, macrocrystal-monohydrate, (MACROBID) 100 MG capsule Take 1 capsule (100 mg total) by mouth 2 (two) times daily.   [DISCONTINUED] oxyCODONE-acetaminophen (PERCOCET/ROXICET) 5-325 MG tablet Take 1 tablet by mouth every 6 (six) hours as needed for severe pain.   No facility-administered encounter medications on file as of 05/05/2022.    ALLERGIES:  Allergies  Allergen Reactions   Sulfa Antibiotics Anaphylaxis and Swelling   Topiramate Swelling    Tongue   Duloxetine Hcl Other (See Comments)    Weight gain, feels "crazy"   Ibuprofen     Due to taking Elavil, causes counter headaches when she takes Ibuprofen   Metformin And Related Other (See Comments)    GI Upset   Penicillins Rash    Has patient had a PCN reaction causing immediate rash, facial/tongue/throat swelling, SOB or lightheadedness with hypotension: Yes Has patient had a PCN reaction causing severe rash involving mucus membranes or skin necrosis: No Has patient had a PCN reaction that required hospitalization No Has patient had a PCN reaction occurring within the last 10 years: No If all of the above answers are "NO", then may proceed with Cephalosporin use.      FAMILY HISTORY:  Family History  Problem Relation Age of Onset   Diabetes Mother    Hypertension Mother    Healthy Father    Breast cancer Maternal Grandmother    Cancer Maternal Grandfather        brain, lung, liver   Other Paternal  Grandmother    Heart attack Paternal Grandfather    Breast cancer Maternal Great-grandmother    Colon cancer Neg Hx    Ovarian cancer Neg Hx    Endometrial cancer Neg Hx    Pancreatic cancer Neg Hx    Prostate cancer Neg Hx      SOCIAL HISTORY:  Social Connections: Not on file     REVIEW OF SYSTEMS:  + Bloating, abdominal pain, hot flashes. Denies appetite changes, fevers, chills, fatigue, unexplained weight changes. Denies hearing loss, neck lumps or masses, mouth sores, ringing in ears or voice changes. Denies cough or wheezing.  Denies shortness of breath. Denies chest pain or palpitations. Denies leg swelling. Denies blood in stools, constipation, diarrhea, nausea, vomiting, or early satiety. Denies pain with intercourse, dysuria, frequency, hematuria or incontinence. Denies pelvic pain, vaginal bleeding or vaginal discharge.   Denies joint pain, back pain or muscle pain/cramps. Denies itching, rash, or wounds. Denies dizziness, headaches, numbness or seizures. Denies swollen lymph nodes or glands, denies easy bruising or bleeding. Denies anxiety, depression, confusion, or decreased concentration.  Physical Exam:  Vital Signs for this encounter:  Blood pressure 129/71, pulse 77, temperature 98.6 F (37 C), temperature source Oral, resp. rate 16, height '5\' 9"'$  (1.753 m), weight 200 lb 6.4 oz (90.9 kg), last menstrual period 08/15/2014, SpO2 100 %. Body mass index is 29.59 kg/m. General: Alert, oriented, no acute distress.  HEENT: Normocephalic, atraumatic. Sclera anicteric.  Chest: Clear to auscultation bilaterally. No wheezes, rhonchi, or rales. Cardiovascular: Regular rate and rhythm, no murmurs, rubs, or gallops.  Abdomen: Normoactive bowel sounds. Soft, nondistended, nontender to palpation. No masses or hepatosplenomegaly appreciated. No palpable fluid wave.  Well-healed Pfannenstiel incision, laparoscopic incisions. Extremities: Grossly normal range of motion. Warm, well perfused. No edema bilaterally.  Skin: No rashes or lesions.  Lymphatics: No cervical, supraclavicular, or inguinal adenopathy.  GU:  Normal external female genitalia. No lesions. No discharge or bleeding. On speculum exam, suture seen anterior along the vagina from prior sling  placement.  Cuff is intact, physiologic discharge.  No masses noted.  On bimanual exam, cuff is intact and smooth, no pain with palpation vaginally.  Adnexa not appreciated, no masses palpated on bimanual exam or rectovaginal exam.  Patient appears to have mild pain with deep palpation with abdominal hand in the left lower quadrant.  Rectovaginal exam confirms these findings.  LABORATORY AND RADIOLOGIC DATA:  Outside medical records were reviewed to synthesize the above history, along with the history and physical obtained during the visit.   Lab Results  Component Value Date   WBC 7.5 02/21/2022   HGB 13.7 02/21/2022   HCT 41.3 02/21/2022   PLT 187 02/21/2022   GLUCOSE 215 (H) 02/21/2022   CHOL 149 08/12/2020   TRIG 169 (H) 08/12/2020   HDL 54 08/12/2020   LDLCALC 67 08/12/2020   ALT 16 02/21/2022   AST 17 02/21/2022   NA 137 02/21/2022   K 3.7 02/21/2022   CL 100 02/21/2022   CREATININE 0.69 02/21/2022   BUN 11 02/21/2022   CO2 29 02/21/2022   TSH 1.500 08/12/2020   INR 1.0 07/11/2009   HGBA1C 7.4 (H) 08/12/2020   MICROALBUR 30 04/16/2020

## 2022-05-06 LAB — ESTRADIOL: Estradiol: 105 pg/mL

## 2022-05-06 LAB — FOLLICLE STIMULATING HORMONE: FSH: 3.3 m[IU]/mL

## 2022-05-13 ENCOUNTER — Ambulatory Visit (HOSPITAL_COMMUNITY): Admission: RE | Admit: 2022-05-13 | Payer: Medicare Other | Source: Ambulatory Visit

## 2022-05-17 ENCOUNTER — Telehealth: Payer: Self-pay | Admitting: *Deleted

## 2022-05-17 NOTE — Telephone Encounter (Signed)
Rescheduled the patient's [place] scan to 6/21 t 2 pm. Patient given appt date/time/instructions

## 2022-05-25 ENCOUNTER — Ambulatory Visit (HOSPITAL_COMMUNITY): Admission: RE | Admit: 2022-05-25 | Payer: Medicare Other | Source: Ambulatory Visit

## 2022-06-02 ENCOUNTER — Other Ambulatory Visit: Payer: Self-pay | Admitting: Gynecologic Oncology

## 2022-06-02 ENCOUNTER — Telehealth: Payer: Self-pay | Admitting: *Deleted

## 2022-06-02 DIAGNOSIS — N83202 Unspecified ovarian cyst, left side: Secondary | ICD-10-CM

## 2022-06-02 NOTE — Telephone Encounter (Signed)
Rescheduled [place] scan to 7/7, patient aware

## 2022-06-10 ENCOUNTER — Ambulatory Visit (HOSPITAL_COMMUNITY)
Admission: RE | Admit: 2022-06-10 | Discharge: 2022-06-10 | Disposition: A | Discharge status: Home / Self Care | Payer: Medicare Other | Source: Ambulatory Visit | Attending: Gynecologic Oncology | Admitting: Gynecologic Oncology

## 2022-06-10 DIAGNOSIS — N83202 Unspecified ovarian cyst, left side: Secondary | ICD-10-CM | POA: Diagnosis present

## 2022-06-14 ENCOUNTER — Telehealth: Payer: Self-pay | Admitting: Gynecologic Oncology

## 2022-06-14 NOTE — Telephone Encounter (Signed)
Called the patient - discussed ultrasound findings. Cyst on left ovary resolved. New hemorrhagic appearing cyst on right. Discussed idea again of hormone suppression to help prevent redeveloping cysts. Patient would like to think about this - I would suggest norethindrone if she decides she's amenable.  Jeral Pinch MD Gynecologic Oncology

## 2022-06-22 ENCOUNTER — Other Ambulatory Visit: Payer: Self-pay | Admitting: Physician Assistant

## 2022-06-22 ENCOUNTER — Other Ambulatory Visit: Payer: Self-pay | Admitting: Internal Medicine

## 2022-06-22 ENCOUNTER — Other Ambulatory Visit: Payer: Self-pay

## 2022-06-22 DIAGNOSIS — R454 Irritability and anger: Secondary | ICD-10-CM

## 2022-06-22 DIAGNOSIS — F411 Generalized anxiety disorder: Secondary | ICD-10-CM

## 2022-06-22 MED ORDER — METOPROLOL SUCCINATE ER 25 MG PO TB24: 25.0000 mg | ORAL_TABLET | Freq: Every day | ORAL | 0 refills | Status: DC

## 2022-07-11 ENCOUNTER — Telehealth: Payer: Self-pay

## 2022-07-11 ENCOUNTER — Inpatient Hospital Stay: Payer: Medicare Other | Admitting: Gynecologic Oncology

## 2022-07-11 NOTE — Telephone Encounter (Signed)
Pt's husband called office to reschedule her phone visit with Dr.Tucker. He states she has a work conflict and a Tuesday or Thursday will be better.   Pt's has been rescheduled for 8/10 @ 4:45pm

## 2022-07-12 ENCOUNTER — Encounter: Payer: Self-pay | Admitting: Physician Assistant

## 2022-07-12 ENCOUNTER — Ambulatory Visit (INDEPENDENT_AMBULATORY_CARE_PROVIDER_SITE_OTHER): Payer: Medicare Other | Admitting: Physician Assistant

## 2022-07-12 VITALS — BP 116/78 | HR 69 | Wt 210.6 lb

## 2022-07-12 DIAGNOSIS — G43119 Migraine with aura, intractable, without status migrainosus: Secondary | ICD-10-CM

## 2022-07-12 MED ORDER — NURTEC 75 MG PO TBDP: ORAL_TABLET | ORAL | 3 refills | Status: DC

## 2022-07-12 NOTE — Progress Notes (Signed)
Established patient visit   Patient: Samantha [name]   DOB: [birth year]   41 y.o. Female  MRN: [id number] Visit Date: 07/12/2022  No chief complaint on file.  Subjective    HPI  Patient reports having frequent migraines. Reports has 4-6 headaches per month with one lasting more than 24 hours. Does have photosensitivity and unbalanced. Does report numbing feeling of lower jaw before headache onset. States last medication was amitriptyline 50 mg at bedtime which she was on for 2 years. States still had headaches with that medication. Previously was also on Depakote which she did not tolerate due to altered taste. Topamax she did not tolerate due to mood changes. Maxalt has worked for rescue headache. It does make her sleepy. Previously seen a neurologist in [facility] but that provider left the practice.     Medications: Outpatient Medications Prior to Visit  Medication Sig   cetirizine (ZYRTEC) 10 MG tablet Take 10 mg by mouth daily.   diclofenac Sodium (VOLTAREN) 1 % GEL diclofenac 1 % topical gel  APPLY 2 TO 4 GRAMS TO THE AFFECTED AREA(S) BY TOPICAL ROUTE UP TO 4 TIMES PER DAY AS NEEDED. MAXIMUM DOSE PER AREA IS 16 G/DAY. MAXIMUM TOTAL BODY DOSE IS 62 G/DAY.   FLUoxetine (PROZAC) 20 MG capsule TAKE 1 CAPSULE (20 MG TOTAL) BY MOUTH DAILY.   HYDROcodone-acetaminophen (NORCO/VICODIN) 5-325 MG tablet Take 1 tablet by mouth every 6 (six) hours as needed for moderate pain.   liraglutide (VICTOZA) 18 MG/3ML SOPN Inject 1.2 mg into the skin daily. (Patient not taking: Reported on 04/26/2022)   metoprolol succinate (TOPROL-XL) 25 MG 24 hr tablet Take 1 tablet (25 mg total) by mouth daily. PATIENT MUST SCHEDULE APPOINTMENT FOR FUTURE REFILLS FIRST ATTEMPT. TAKE 1/2 TABLET DAILY   valACYclovir (VALTREX) 500 MG tablet Take 500 mg by mouth daily as needed (cold sores).    Vitamin D, Ergocalciferol, (DRISDOL) 1.25 MG (50000 UT) CAPS capsule Take one tablet wkly (Patient taking differently: Take 50,000  Units by mouth every 7 (seven) days. Take one tablet wkly)   No facility-administered medications prior to visit.    Review of Systems Review of Systems:  A fourteen system review of systems was performed and found to be positive as per HPI.     Objective    BP 116/78   Pulse 69   Wt 210 lb 9.6 oz (95.5 kg)   LMP 08/15/2014 Comment: partial hysterectomy  BMI 31.10 kg/m  BP Readings from Last 3 Encounters:  07/12/22 116/78  05/05/22 129/71  02/22/22 128/70   Wt Readings from Last 3 Encounters:  07/12/22 210 lb 9.6 oz (95.5 kg)  05/05/22 200 lb 6.4 oz (90.9 kg)  02/22/22 199 lb (90.3 kg)    Physical Exam  General:  Pleasant and cooperative, appropriate for stated age.  Neuro:  Alert and oriented,  extra-ocular muscles intact  HEENT:  Normocephalic, atraumatic, neck supple  Skin:  no gross rash, warm, pink. Cardiac:  RRR, S1 S2 Respiratory: CTA B/L  Vascular:  Ext warm, no cyanosis apprec.; cap RF less 2 sec. Psych:  No HI/SI, judgement and insight good, Euthymic mood. Full Affect.   No results found for any visits on 07/12/22.  Assessment & Plan      Problem List Items Addressed This Visit   None Visit Diagnoses     Intractable migraine with aura without status migrainosus    -  Primary   Relevant Medications   Rimegepant Sulfate (NURTEC) 75 MG TBDP  Intractable migraine with aura without status migrainosus: -Patient has tried and failed multiple preventative medications including Topamax, Amitriptyline and Depakote. Will start Nurtec 75 mg every other day as preventative therapy. Discussed potential side effects. Advised to let me know if unable to tolerate medication. Recommend to keep headache journal. Will reassess headaches and medication therapy in 3 months. Provided samples of Nurtec.  Return in about 3 months (around 10/12/2022) for migraine.        Lorrene Reid, PA-C  [facility] [phone]  (phone) [phone] (fax)  [place]

## 2022-07-12 NOTE — Patient Instructions (Signed)
Migraine Headache A migraine headache is an intense, throbbing pain on one side or both sides of the head. Migraine headaches may also cause other symptoms, such as nausea, vomiting, and sensitivity to light and noise. A migraine headache can last from 4 hours to 3 days. Talk with your doctor about what things may bring on (trigger) your migraine headaches. What are the causes? The exact cause of this condition is not known. However, a migraine may be caused when nerves in the brain become irritated and release chemicals that cause inflammation of blood vessels. This inflammation causes pain. This condition may be triggered or caused by: Drinking alcohol. Smoking. Taking medicines, such as: Medicine used to treat chest pain (nitroglycerin). Birth control pills. Estrogen. Certain blood pressure medicines. Eating or drinking products that contain nitrates, glutamate, aspartame, or tyramine. Aged cheeses, chocolate, or caffeine may also be triggers. Doing physical activity. Other things that may trigger a migraine headache include: Menstruation. Pregnancy. Hunger. Stress. Lack of sleep or too much sleep. Weather changes. Fatigue. What increases the risk? The following factors may make you more likely to experience migraine headaches: Being a certain age. This condition is more common in people who are 25-55 years old. Being female. Having a family history of migraine headaches. Being Caucasian. Having a mental health condition, such as depression or anxiety. Being obese. What are the signs or symptoms? The main symptom of this condition is pulsating or throbbing pain. This pain may: Happen in any area of the head, such as on one side or both sides. Interfere with daily activities. Get worse with physical activity. Get worse with exposure to bright lights or loud noises. Other symptoms may include: Nausea. Vomiting. Dizziness. General sensitivity to bright lights, loud noises, or  smells. Before you get a migraine headache, you may get warning signs (an aura). An aura may include: Seeing flashing lights or having blind spots. Seeing bright spots, halos, or zigzag lines. Having tunnel vision or blurred vision. Having numbness or a tingling feeling. Having trouble talking. Having muscle weakness. Some people have symptoms after a migraine headache (postdromal phase), such as: Feeling tired. Difficulty concentrating. How is this diagnosed? A migraine headache can be diagnosed based on: Your symptoms. A physical exam. Tests, such as: CT scan or an MRI of the head. These imaging tests can help rule out other causes of headaches. Taking fluid from the spine (lumbar puncture) and analyzing it (cerebrospinal fluid analysis, or CSF analysis). How is this treated? This condition may be treated with medicines that: Relieve pain. Relieve nausea. Prevent migraine headaches. Treatment for this condition may also include: Acupuncture. Lifestyle changes like avoiding foods that trigger migraine headaches. Biofeedback. Cognitive behavioral therapy. Follow these instructions at home: Medicines Take over-the-counter and prescription medicines only as told by your health care provider. Ask your health care provider if the medicine prescribed to you: Requires you to avoid driving or using heavy machinery. Can cause constipation. You may need to take these actions to prevent or treat constipation: Drink enough fluid to keep your urine pale yellow. Take over-the-counter or prescription medicines. Eat foods that are high in fiber, such as beans, whole grains, and fresh fruits and vegetables. Limit foods that are high in fat and processed sugars, such as fried or sweet foods. Lifestyle Do not drink alcohol. Do not use any products that contain nicotine or tobacco, such as cigarettes, e-cigarettes, and chewing tobacco. If you need help quitting, ask your health care  provider. Get at least 8   hours of sleep every night. Find ways to manage stress, such as meditation, deep breathing, or yoga. General instructions     Keep a journal to find out what may trigger your migraine headaches. For example, write down: What you eat and drink. How much sleep you get. Any change to your diet or medicines. If you have a migraine headache: Avoid things that make your symptoms worse, such as bright lights. It may help to lie down in a dark, quiet room. Do not drive or use heavy machinery. Ask your health care provider what activities are safe for you while you are experiencing symptoms. Keep all follow-up visits as told by your health care provider. This is important. Contact a health care provider if: You develop symptoms that are different or more severe than your usual migraine headache symptoms. You have more than 15 headache days in one month. Get help right away if: Your migraine headache becomes severe. Your migraine headache lasts longer than 72 hours. You have a fever. You have a stiff neck. You have vision loss. Your muscles feel weak or like you cannot control them. You start to lose your balance often. You have trouble walking. You faint. You have a seizure. Summary A migraine headache is an intense, throbbing pain on one side or both sides of the head. Migraines may also cause other symptoms, such as nausea, vomiting, and sensitivity to light and noise. This condition may be treated with medicines and lifestyle changes. You may also need to avoid certain things that trigger a migraine headache. Keep a journal to find out what may trigger your migraine headaches. Contact your health care provider if you have more than 15 headache days in a month or you develop symptoms that are different or more severe than your usual migraine headache symptoms. This information is not intended to replace advice given to you by your health care provider. Make sure  you discuss any questions you have with your health care provider. Document Revised: 03/15/2019 Document Reviewed: 01/03/2019 Elsevier Patient Education  2023 Elsevier Inc.  

## 2022-07-14 ENCOUNTER — Encounter: Payer: Self-pay | Admitting: Gynecologic Oncology

## 2022-07-14 ENCOUNTER — Inpatient Hospital Stay: Payer: Medicare Other | Attending: Gynecologic Oncology | Admitting: Gynecologic Oncology

## 2022-07-14 DIAGNOSIS — N83202 Unspecified ovarian cyst, left side: Secondary | ICD-10-CM

## 2022-07-14 DIAGNOSIS — R1032 Left lower quadrant pain: Secondary | ICD-10-CM | POA: Diagnosis not present

## 2022-07-14 DIAGNOSIS — N736 Female pelvic peritoneal adhesions (postinfective): Secondary | ICD-10-CM

## 2022-07-14 MED ORDER — NORETHINDRONE 0.35 MG PO TABS: 1.0000 | ORAL_TABLET | Freq: Every day | ORAL | 11 refills | Status: DC

## 2022-07-14 NOTE — Progress Notes (Signed)
Gynecologic Oncology Telehealth Consult Note: Gyn-Onc  I connected with Samantha [name] on 07/14/22 at  4:45 PM EDT by telephone and verified that I am speaking with the correct person using two identifiers.  I discussed the limitations, risks, security and privacy concerns of performing an evaluation and management service by telemedicine and the availability of in-person appointments. I also discussed with the patient that there may be a patient responsible charge related to this service. The patient expressed understanding and agreed to proceed.  Other persons participating in the visit and their role in the encounter: Patient's husband.  Patient's location: [place]  Provider's location: [facility]  Reason for Visit: Follow-up after recent ultrasound  Treatment History: Treatment history: 09/2014: Patient underwent total abdominal hysterectomy and bilateral salpingectomy for abnormal uterine bleeding.  Findings at the time of her surgery included normal uterus and bilateral tubes.  Left ovary with a simple appearing cyst, cyst wall excised at the time of surgery. 09/2020: 4 mm right UVJ calculus causing mild hydroureteronephrosis.  Symmetric and normal appearing adnexa. 03/2021: CT of the abdomen and pelvis shows nonobstructing left upper pole calculus.  Mixed low and intermediate attenuation fluid in the pelvis with a 2.4 cm dense fluid collection thought to perhaps represent a ruptured left ovarian cyst. 07/2021: Patient underwent TVT and cystoscopy for stress urinary incontinence. 02/17/22: Pelvic ultrasound exam at [place] she has right measuring measures 3.33 x 2.65 x 1.88 cm.  Left adnexa measures 5.68 x 5.44 x 4.13 cm.  Left ovary noted to contain cystic structure with internal echoes measuring up to 4.6 cm, likely hemorrhagic cyst.  Normal venous and arterial waveforms.  No free fluid. 02/21/22: Presented to the emergency department with acute worsening of the left-sided  pelvic pain that she had endorsed for several months.  Pelvic ultrasound at that time showed left ovary measures 4.3 x 1.5 x 3.6 cm with a pedunculated anechoic cystic lesion with mildly irregular margins but without signs of gross wall thickening or internal mural nodularity.  This area measures 0.4 x 1.7 x 1 cm.  Right ovary measures up to 3.4 cm without masses.  No free fluid. 02/23/22: Patient was taken to the operating room for planned left oophorectomy after she presented with worsening pelvic pain.  She underwent diagnostic laparoscopy and adhesiolysis.  Findings were notable for dense omental adhesions from the anterior abdominal wall to the large bowel, pelvic adhesions, right ovary adherent to the right pelvic sidewall, and left ovary adherent with dense adhesions to the posterior cul-de-sac and bowel.  No apparent ovarian cyst appreciated.   Today, patient notes an approximate 14-monthhistory of left-sided pelvic pain.  She does not remember exactly when it started, but given her history of prior ovarian cyst and similar pain, thought that this was likely what was going on.  She scheduled a visit to see her OB/GYN in March of this year.  Since her surgery on 3/22, the patient notes worsening pelvic pain that she now describes as constant.  She gets some minor relief if she lays down on her left side.  She describes the pain in her left pelvis as "feeling like she is going to explode", being bloated, and sometimes like she is "burning" as if someone is "lighting a match" deep on the inside.  She has occasional nausea, denies any emesis.  She has been using hydrocodone as needed for pain that she had from her prior bladder surgery, otherwise uses Tylenol.  06/10/2022: Pelvic ultrasound  shows resolution of left adnexal cyst.  Right ovary with new complicated 4.7 cm hemorrhagic appearing cyst, no follow-up recommended.  No free fluid.  Interval History: Abdominal pain. Left pelvic pain intermittently,  not daily.  Feels like used to pain. Otherwise feels similar to last time I saw her.  Past Medical/Surgical History: Past Medical History:  Diagnosis Date   Anemia    Angio-edema    Anxiety    Chest pain    a. 06/2020 MV: EF 55%, no ischemia or infarct.  No significant coronary calcium on attenuation correction CT.   Complication of anesthesia    slow to wake up 07/26/2021   COVID    sept 2021 flu like symptoms and cough.   lasted 5 days. 07/26/2021   Depression    rx presc but not taken   Diabetes mellitus without complication ([place])    Dilated aortic root ([place])    a. 05/2020 Echo: Ao root 68m; b. 08/2021 Echo: Ao root 420m Asc Ao 4068m  Fibromyalgia    GERD (gastroesophageal reflux disease)    no meds   H/o Sinus infection    Headache(784.0)    tension and with anxiety hx. complicated migraine 07/05/73/9449Hepatic steatosis    History of kidney stones    MVP (mitral valve prolapse)    a. Pt told MVP present as child; b. 05/2020 Echo: EF 55-60%, no rwma, nl RV size/fxn, triv MR, mild AI. Ao root 48m70m. 05/2021 Echo: EF 60-65%, no rwma, GrI DD, nl RV fxn, mildly dil LA, no MR, Ao root 42mm67mc AO 40mm.109mneumonia    hx   Seizures (HCC)  [place]. non epileptic events per epilepsy monitoring unit; b. last seizure July 2015- does not convulse but has a sleep effect. pt states she doesnt have seizures, she has complicated migraines and causes her to have sleep like effective per pt. [facility] Rosali[facility]is Rangehe left the practiced and doesnt have new dr. 07/27/2021   Sleep apnea    has cpap does not use   Wears glasses    07/26/2021    Past Surgical History:  Procedure Laterality Date   ABDOMINAL HYSTERECTOMY N/A 08/15/2014   Procedure: HYSTERECTOMY ABDOMINAL WITH CYSTOSCOPY;  Surgeon: Walda Sanjuana Kava [facility]: [facility]cology;  Laterality: N/A;   BLADDER SUSPENSION N/A 07/30/2021   Procedure: TRANSVAGINAL TAPE (TVT)  PROCEDURE;  Surgeon: Pinn, Sanjuana Kava [facility];  Laterality: N/A;   CESAREAN SECTION N/A    2010 07/26/2021   CYSTOSCOPY N/A 07/30/2021   Procedure: CYSTOSCOPY;  Surgeon: Pinn, Sanjuana Kava [facility];  Laterality: N/A;   CYSTOSCOPY/RETROGRADE/URETEROSCOPY Left 05/23/2019   Procedure: CYSTOSCOPY/RETROGRADE/URETEROSCOPY;  Surgeon: StoiofAbbie Sons Location: [facility];  Service: Urology;  Laterality: Left;   KNEE ARTHROSCOPY     yr 2020 07/26/2021   LAPAROSCOPIC LYSIS OF ADHESIONS N/A 02/22/2022   Procedure: LAPAROSCOPIC LYSIS OF ADHESIONS;  Surgeon: Pinn, Sanjuana Kava [facility] [place];  Laterality: N/A;   LAPAROSCOPY Left 02/22/2022   Procedure: LAPAROSCOPY DIAGNOSTIC and Pelvic Washings;  Surgeon: Pinn, Sanjuana Kava [facility];  Laterality: Left;   SHOULDER ARTHROSCOPY WITH SUBACROMIAL DECOMPRESSION AND OPEN ROTATOR C Left 11/27/2015   Procedure: LEFT SHOULDER ARTHROSCOPY WITH SUBACROMIAL DECOMPRESSION, MINI OPEN ROTATOR CUFF REPAIR;  Surgeon: Netta Cedars, MD;  Location: [place];  Service: Orthopedics;  Laterality: Left;   TUBAL LIGATION      Family History  Problem Relation Age of Onset   Diabetes Mother    Hypertension Mother    Healthy Father    Breast cancer Maternal Grandmother    Cancer Maternal Grandfather        brain, lung, liver   Other Paternal Grandmother    Heart attack Paternal Grandfather    Breast cancer Maternal Great-grandmother    Colon cancer Neg Hx    Ovarian cancer Neg Hx    Endometrial cancer Neg Hx    Pancreatic cancer Neg Hx    Prostate cancer Neg Hx     Social History   Socioeconomic History   Marital status: Married    Spouse name: Not on file   Number of children: Not on file   Years of education: Not on file   Highest education level: Not on file  Occupational History   Occupation: works a Special educational needs teacher  Tobacco Use   Smoking status: Never   Smokeless tobacco: Never  Scientific laboratory technician Use: Never used  Substance and Sexual Activity   Alcohol use: No   Drug use: No   Sexual activity: Yes    Birth control/protection: Surgical  Other Topics Concern   Not on file  Social History Narrative   Not on file   Social Determinants of Health   Financial Resource Strain: Not on file  Food Insecurity: Not on file  Transportation Needs: Not on file  Physical Activity: Not on file  Stress: Not on file  Social Connections: Not on file    Current Medications:  Current Outpatient Medications:    norethindrone (ORTHO MICRONOR) 0.35 MG tablet, Take 1 tablet (0.35 mg total) by mouth daily., Disp: 28 tablet, Rfl: 11   cetirizine (ZYRTEC) 10 MG tablet, Take 10 mg by mouth daily., Disp: , Rfl:    diclofenac Sodium (VOLTAREN) 1 % GEL, diclofenac 1 % topical gel  APPLY 2 TO 4 GRAMS TO THE AFFECTED AREA(S) BY TOPICAL ROUTE UP TO 4 TIMES PER DAY AS NEEDED. MAXIMUM DOSE PER AREA IS 16 G/DAY. MAXIMUM TOTAL BODY DOSE IS 62 G/DAY., Disp: , Rfl:    FLUoxetine (PROZAC) 20 MG capsule, TAKE 1 CAPSULE (20 MG TOTAL) BY MOUTH DAILY., Disp: 90 capsule, Rfl: 0   HYDROcodone-acetaminophen (NORCO/VICODIN) 5-325 MG tablet, Take 1 tablet by mouth every 6 (six) hours as needed for moderate pain., Disp: 40 tablet, Rfl: 0   liraglutide (VICTOZA) 18 MG/3ML SOPN, Inject 1.2 mg into the skin daily. (Patient not taking: Reported on 04/26/2022), Disp: , Rfl:    metoprolol succinate (TOPROL-XL) 25 MG 24 hr tablet, Take 1 tablet (25 mg total) by mouth daily. PATIENT MUST SCHEDULE APPOINTMENT FOR FUTURE REFILLS FIRST ATTEMPT. TAKE 1/2 TABLET DAILY, Disp: 45 tablet, Rfl: 0   Rimegepant Sulfate (NURTEC) 75 MG TBDP, Take 1 tablet by mouth every other day., Disp: 30 tablet, Rfl: 3   valACYclovir (VALTREX) 500 MG tablet, Take 500 mg by mouth daily as needed (cold sores). , Disp: , Rfl:    Vitamin D, Ergocalciferol,  (DRISDOL) 1.25 MG (50000 UT) CAPS capsule, Take one tablet wkly (Patient taking differently: Take 50,000 Units by mouth every 7 (seven) days. Take one tablet wkly), Disp: 12 capsule, Rfl: 3  Review of Symptoms: Pertinent positives as per HPI.  Physical Exam: LMP 08/15/2014 Comment: partial hysterectomy Deferred given limitations  of phone visit.  Laboratory & Radiologic Studies: 06/10/22: Pelvic ultrasound IMPRESSION: Post hysterectomy.   Resolution of previously identified LEFT ovarian cyst.   New complicated cyst RIGHT ovary 4.7 cm diameter, appearance consistent with a hemorrhagic cyst; no follow-up imaging recommended.  FSH: 3.3 Estradiol: 105  Assessment & Plan: Samantha [name] is a 41 y.o. woman with known pelvic adhesions from prior surgeries with approximately 95-monthhistory of pelvic pain in the setting of an ovarian cyst.  Reviewed with patient both lab testing which indicate she is premenopausal as well as recent ultrasound.  Left ovarian cyst has completely resolved on most recent ultrasound.  She has a new, likely hemorrhagic cyst, on the right ovary.  Patient continues to have left-sided intermittent pelvic pain, suspect that this is either related to GI system or her known pelvic adhesions.  In terms of decreasing the risk related to pain from an ovarian cyst, we had previously discussed using hormonal suppression to avoid cyst formation.  I had suggested we try low-dose progesterone only pill.  Discussed this again today, patient amenable to trying this.  I have sent her information by MyChart regarding norethindrone.  I have asked her to call and let me know if she has any side effects after starting the medication.  I discussed the assessment and treatment plan with the patient. The patient was provided with an opportunity to ask questions and all were answered. The patient agreed with the plan and demonstrated an understanding of the instructions.   The patient was  advised to call back or see an in-person evaluation if the symptoms worsen or if the condition fails to improve as anticipated.   12 minutes of total time was spent for this patient encounter, including preparation, face-to-face counseling with the patient and coordination of care, and documentation of the encounter.   KJeral Pinch MD  Division of Gynecologic Oncology  Department of Obstetrics and Gynecology  [facility]

## 2022-07-18 ENCOUNTER — Telehealth: Payer: Self-pay | Admitting: Physician Assistant

## 2022-07-18 DIAGNOSIS — R110 Nausea: Secondary | ICD-10-CM

## 2022-07-18 MED ORDER — ONDANSETRON HCL 4 MG PO TABS: 4.0000 mg | ORAL_TABLET | Freq: Three times a day (TID) | ORAL | 0 refills | Status: DC | PRN

## 2022-07-18 NOTE — Telephone Encounter (Signed)
Please see below.

## 2022-07-18 NOTE — Telephone Encounter (Signed)
Please let patient know the below. RX sent to pharmacy. AS, CMA

## 2022-07-18 NOTE — Telephone Encounter (Signed)
Patient will need to schedule an appointment to discuss new meds with [facility]. AS, CMA

## 2022-07-18 NOTE — Telephone Encounter (Signed)
When patient first started taking the Nurtec it did help but now it's taking hours to kick in and once it does she is staying extremely nauseated. Please advise.

## 2022-07-19 NOTE — Telephone Encounter (Signed)
Patient aware and agreeable. 

## 2022-07-26 ENCOUNTER — Telehealth: Payer: Self-pay | Admitting: Physician Assistant

## 2022-07-26 DIAGNOSIS — G43119 Migraine with aura, intractable, without status migrainosus: Secondary | ICD-10-CM

## 2022-07-26 MED ORDER — NURTEC 75 MG PO TBDP: ORAL_TABLET | ORAL | 3 refills | Status: DC

## 2022-07-26 NOTE — Telephone Encounter (Signed)
I gave patient info for [facility] and she called back and said they needed a PA. Is this something they will send over to [place]?

## 2022-07-26 NOTE — Telephone Encounter (Signed)
Patient aware.

## 2022-07-26 NOTE — Telephone Encounter (Signed)
Patient states she was supposed to get set up through a specialty pharmacy for Nurtec but has not heard anything and has taken the last one today. She is asking what to do and do we have any samples she can get? Please advise.

## 2022-07-26 NOTE — Telephone Encounter (Signed)
Yes

## 2022-07-26 NOTE — Telephone Encounter (Signed)
RX has been resent to [facility]. Patient can contact them at [phone]. AS, CMA

## 2022-07-27 NOTE — Telephone Encounter (Signed)
Patient aware.

## 2022-08-19 ENCOUNTER — Ambulatory Visit: Payer: Medicare Other | Admitting: Nurse Practitioner

## 2022-08-22 ENCOUNTER — Telehealth: Payer: Self-pay | Admitting: Physician Assistant

## 2022-08-22 DIAGNOSIS — G43119 Migraine with aura, intractable, without status migrainosus: Secondary | ICD-10-CM

## 2022-08-22 NOTE — Telephone Encounter (Signed)
Patient stated there was a medication you had mentioned taking daily for her headaches since it's taking a while to get into neuro and since nurtec has not been approved. Are you familiar with what she is talking about?

## 2022-08-23 NOTE — Telephone Encounter (Signed)
Patient aware.

## 2022-08-26 ENCOUNTER — Encounter: Payer: Self-pay | Admitting: *Deleted

## 2022-08-31 ENCOUNTER — Ambulatory Visit: Payer: Medicare Other | Admitting: Physician Assistant

## 2022-09-02 ENCOUNTER — Ambulatory Visit: Payer: Medicare Other | Admitting: Physician Assistant

## 2022-09-02 ENCOUNTER — Telehealth: Payer: Self-pay | Admitting: Physician Assistant

## 2022-09-02 NOTE — Telephone Encounter (Signed)
Good afternoon Anderson Malta,   Patient called stating that she needed to reschedule  her appointment with you today at 2:00 due to having to deal with issues with her animals and lost track of time.   Patient was rescheduled for 10/31 at 10:00am

## 2022-09-12 DIAGNOSIS — M25572 Pain in left ankle and joints of left foot: Secondary | ICD-10-CM | POA: Diagnosis not present

## 2022-09-12 MED ORDER — VERAPAMIL HCL 80 MG PO TABS: 80.0000 mg | ORAL_TABLET | Freq: Three times a day (TID) | ORAL | 1 refills | Status: DC

## 2022-09-12 NOTE — Telephone Encounter (Signed)
Pt is calling back requesting the Rx for verapamil for headaches as previously discussed.  Pharmacy: [facility], [place]  Please advise

## 2022-09-12 NOTE — Addendum Note (Signed)
Addended by: Lorrene Reid on: 09/12/2022 03:59 PM   Modules accepted: Orders

## 2022-09-13 NOTE — Telephone Encounter (Signed)
I advised pt that medication was sent to the requested pharmacy.  Also scheduled a 2 mo fu for 11/14/22.

## 2022-09-15 ENCOUNTER — Other Ambulatory Visit: Payer: Self-pay

## 2022-09-15 ENCOUNTER — Telehealth: Payer: Self-pay

## 2022-09-15 DIAGNOSIS — G4733 Obstructive sleep apnea (adult) (pediatric): Secondary | ICD-10-CM

## 2022-09-15 NOTE — Progress Notes (Signed)
Error

## 2022-09-15 NOTE — Telephone Encounter (Signed)
Sleep study has been placed

## 2022-09-15 NOTE — Telephone Encounter (Signed)
Pt husband Will calling to request a new referral for [facility] for the implanted device for sleep apnea.  He states that she can not wear the mask and is needing a different option for sleep apnea.  I advised Will that Lorrene Reid, PA is willing to place the referral. But if update office notes are needed then she will need to be seen in office before next scheduled appt 12/23.  Will understood and agreed to let the pt know the detail.

## 2022-09-22 ENCOUNTER — Other Ambulatory Visit: Payer: Self-pay | Admitting: Internal Medicine

## 2022-09-22 ENCOUNTER — Other Ambulatory Visit: Payer: Self-pay | Admitting: Physician Assistant

## 2022-09-22 DIAGNOSIS — R454 Irritability and anger: Secondary | ICD-10-CM

## 2022-09-22 DIAGNOSIS — F411 Generalized anxiety disorder: Secondary | ICD-10-CM

## 2022-09-23 ENCOUNTER — Telehealth: Payer: Self-pay | Admitting: Internal Medicine

## 2022-09-23 NOTE — Telephone Encounter (Signed)
Needs appointment for refills. Thank you! 

## 2022-09-23 NOTE — Telephone Encounter (Signed)
LVM to schedule appt, please schedule 

## 2022-09-26 ENCOUNTER — Ambulatory Visit: Payer: Medicare Other | Admitting: Physician Assistant

## 2022-09-27 ENCOUNTER — Ambulatory Visit (INDEPENDENT_AMBULATORY_CARE_PROVIDER_SITE_OTHER): Payer: Medicare Other | Admitting: Physician Assistant

## 2022-09-27 ENCOUNTER — Encounter: Payer: Self-pay | Admitting: Physician Assistant

## 2022-09-27 VITALS — BP 126/75 | HR 77 | Temp 97.9°F | Ht 69.0 in | Wt 211.0 lb

## 2022-09-27 DIAGNOSIS — G4710 Hypersomnia, unspecified: Secondary | ICD-10-CM

## 2022-09-27 DIAGNOSIS — G4733 Obstructive sleep apnea (adult) (pediatric): Secondary | ICD-10-CM | POA: Diagnosis not present

## 2022-09-27 NOTE — Progress Notes (Signed)
Established patient visit   Patient: Samantha [name]   DOB: Sep 12, 1981   41 y.o. Female  MRN: [id number] Visit Date: 09/27/2022  Chief Complaint  Patient presents with   Follow-up   Subjective    HPI  Patient presents for discussion of sleep apnea. Patient has a history of sleep apnea and reports unable to use CPAP machine due to claustrophobia and panic attack. States in the past has tried other devices including full face mask. Reports is interested on implant device. States feels exhausted throughout the day and has take at least one nap daily for 1.5-2 hrs. Sleeps about 6 hrs at night. Does snore with apnea episodes per patient's husband.     Medications: Outpatient Medications Prior to Visit  Medication Sig   cetirizine (ZYRTEC) 10 MG tablet Take 10 mg by mouth daily.   diclofenac Sodium (VOLTAREN) 1 % GEL diclofenac 1 % topical gel  APPLY 2 TO 4 GRAMS TO THE AFFECTED AREA(S) BY TOPICAL ROUTE UP TO 4 TIMES PER DAY AS NEEDED. MAXIMUM DOSE PER AREA IS 16 G/DAY. MAXIMUM TOTAL BODY DOSE IS 62 G/DAY.   FLUoxetine (PROZAC) 20 MG capsule TAKE 1 CAPSULE BY MOUTH DAILY.   HYDROcodone-acetaminophen (NORCO/VICODIN) 5-325 MG tablet Take 1 tablet by mouth every 6 (six) hours as needed for moderate pain.   liraglutide (VICTOZA) 18 MG/3ML SOPN Inject 1.2 mg into the skin daily.   metoprolol succinate (TOPROL-XL) 25 MG 24 hr tablet TAKE 1/2 TABLET BY MOUTH DAILY. PATIENT MUST SCHEDULE APPOINTMENT FOR FUTURE REFILLS FIRST ATTEMPT.   norethindrone (ORTHO MICRONOR) 0.35 MG tablet Take 1 tablet (0.35 mg total) by mouth daily.   ondansetron (ZOFRAN) 4 MG tablet Take 1 tablet (4 mg total) by mouth every 8 (eight) hours as needed for nausea or vomiting.   valACYclovir (VALTREX) 500 MG tablet Take 500 mg by mouth daily as needed (cold sores).    verapamil (CALAN) 80 MG tablet Take 1 tablet (80 mg total) by mouth 3 (three) times daily.   Vitamin D, Ergocalciferol, (DRISDOL) 1.25 MG (50000 UT) CAPS capsule  Take one tablet wkly (Patient taking differently: Take 50,000 Units by mouth every 7 (seven) days. Take one tablet wkly)   Rimegepant Sulfate (NURTEC) 75 MG TBDP Take 1 tablet by mouth every other day. (Patient not taking: Reported on 09/27/2022)   No facility-administered medications prior to visit.    Review of Systems Review of Systems:  A fourteen system review of systems was performed and found to be positive as per HPI.     Objective    BP 126/75   Pulse 77   Temp 97.9 F (36.6 C) (Temporal)   Ht '5\' 9"'$  (1.753 m)   Wt 211 lb (95.7 kg)   LMP 08/15/2014 Comment: partial hysterectomy  BMI 31.16 kg/m  BP Readings from Last 3 Encounters:  09/27/22 126/75  07/12/22 116/78  05/05/22 129/71   Wt Readings from Last 3 Encounters:  09/27/22 211 lb (95.7 kg)  07/12/22 210 lb 9.6 oz (95.5 kg)  05/05/22 200 lb 6.4 oz (90.9 kg)    Physical Exam  General:  Pleasant and cooperative, appropriate for stated age.  Neuro:  Alert and oriented,  extra-ocular muscles intact  HEENT:  Normocephalic, atraumatic, neck supple  Skin:  no gross rash, warm, pink. Cardiac:  RRR, S1 S2 Respiratory: CTA B/L  Vascular:  Ext warm, no cyanosis apprec.; cap RF less 2 sec. Psych:  No HI/SI, judgement and insight good, Euthymic mood. Full Affect.  No results found for any visits on 09/27/22.  Assessment & Plan      Problem List Items Addressed This Visit       Respiratory   OSA on CPAP - Primary (Chronic)   Relevant Orders   Ambulatory referral to Sleep Studies   Other Visit Diagnoses     Hypersomnia       Relevant Orders   Ambulatory referral to Sleep Studies      Patient presenting with history of sleep apnea and unable to tolerate CPAP device. Reviewed last office note with [facility] from 06/30/2015 with plan to do in-lab sleep study to evaluate for mandibular device. Unable to locate sleep study report. Will place referral to sleep center.  Return if  symptoms worsen or fail to improve.        Lorrene Reid, PA-C  [facility] at [facility] [phone] (phone) [phone] (fax)  [place]

## 2022-10-04 ENCOUNTER — Encounter: Payer: Self-pay | Admitting: Physician Assistant

## 2022-10-04 ENCOUNTER — Ambulatory Visit (INDEPENDENT_AMBULATORY_CARE_PROVIDER_SITE_OTHER): Payer: Medicare Other | Admitting: Physician Assistant

## 2022-10-04 VITALS — BP 132/74 | HR 93 | Ht 69.0 in | Wt 208.4 lb

## 2022-10-04 DIAGNOSIS — R194 Change in bowel habit: Secondary | ICD-10-CM | POA: Diagnosis not present

## 2022-10-04 DIAGNOSIS — R1084 Generalized abdominal pain: Secondary | ICD-10-CM

## 2022-10-04 MED ORDER — PLENVU 140 G PO SOLR: 1.0000 | ORAL | 0 refills | Status: DC

## 2022-10-04 MED ORDER — HYOSCYAMINE SULFATE 0.125 MG SL SUBL
0.1250 mg | SUBLINGUAL_TABLET | Freq: Four times a day (QID) | SUBLINGUAL | 3 refills | Status: DC | PRN
Start: 2022-10-04 — End: 2023-03-20

## 2022-10-04 MED ORDER — POLYETHYLENE GLYCOL 3350 17 G PO PACK: 17.0000 g | PACK | Freq: Every day | ORAL | 0 refills | Status: DC

## 2022-10-04 NOTE — Progress Notes (Signed)
Chief Complaint: Changes in bowel habits  HPI:    Samantha [name] is a 41 year old female, known to Dr. Hilarie Fredrickson, with a past medical history as listed below including diabetes, GERD, fibromyalgia and multiple others, who was referred to me by Lorrene Reid, PA-C for a complaint of change in bowel habits.     10/12/2015 EGD and colonoscopy with Dr. Geoffry Paradise were normal.    07/31/2018 office visit with Dr. Dr. Hilarie Fredrickson for upper abdominal pain, bloating and loose stools, prescribed Xifaxan 550 3 times daily x14 days without improvement.  Dicyclomine without improvement.    02/2019 Food allergy testing without significant findings.    02/18/2020 office visit with Carl Best, NP for episodes of right upper quadrant pain.  That time discussed episodes of nausea and right upper quadrant pain.  Recent ultrasound with normal gallbladder.  At that time ordered labs including CBC, CMP, CRP and lipase as well as HIDA scan with [facility].  Started Zofran and Hyoscyamine.    06/09/2020 HIDA scan was normal.    09/09/2020 CTAP with contrast with a 4 mm right UVJ calculus causing mild hydroureteronephrosis and patchy pneumonia.    06/10/2022 pelvic ultrasound post hysterectomy and new complicated cyst in the right ovary 4.7 cm in diameter appearance consistent with a hemorrhagic cyst.    Today, the patient presents to clinic and tells me that she continues with generalized abdominal pain, initially thought this was related to some ovarian cyst she was having trouble with but she has been put on birth control and that has helped her cyst formation, so she does not think that anymore.  This is mostly just under her umbilicus on both sides and is pretty chronic, somewhat worse before bowel movement, slightly better after.  Has also noticed a change in bowel habits telling me she will have a soft solid stool 4 times in the morning in an hour time span before leaving to work and then throughout the day will go 4 more times.  She does  not understand how there can be "so much in me".  Also feels like sometimes after she has a bowel movement she feels "swollen", and tight across her lower abdomen.  Apparently has tried some elimination diets and nothing has really helped her.  She cannot identify any foods that seem to cause these problems.    Denies fever, chills, weight loss or blood in her stool.  Past Medical History:  Diagnosis Date   Anemia    Angio-edema    Anxiety    Chest pain    a. 06/2020 MV: EF 55%, no ischemia or infarct.  No significant coronary calcium on attenuation correction CT.   Complication of anesthesia    slow to wake up 07/26/2021   COVID    sept 2021 flu like symptoms and cough.   lasted 5 days. 07/26/2021   Depression    rx presc but not taken   Diabetes mellitus without complication ([place])    Dilated aortic root ([place])    a. 05/2020 Echo: Ao root 70m; b. 08/2021 Echo: Ao root 446m Asc Ao 4045m  Fatty liver    Fibromyalgia    GERD (gastroesophageal reflux disease)    no meds   H/o Sinus infection    Headache(784.0)    tension and with anxiety hx. complicated migraine 07/10/25/9485Hepatic steatosis    History of kidney stones    MVP (mitral valve prolapse)    a. Pt told MVP present  as child; b. 05/2020 Echo: EF 55-60%, no rwma, nl RV size/fxn, triv MR, mild AI. Ao root 18m; c. 05/2021 Echo: EF 60-65%, no rwma, GrI DD, nl RV fxn, mildly dil LA, no MR, Ao root 469m Asc AO 4089m  Ovarian cyst    Pneumonia    hx   Seizures ([facility]  a. non epileptic events per epilepsy monitoring unit; b. last seizure July 2015- does not convulse but has a sleep effect. pt states she doesnt have seizures, she has complicated migraines and causes her to have sleep like effective per pt. [facility] she left the practiced and doesnt have new dr. 07/27/2021   Sleep apnea    has cpap does not use   Wears glasses    07/26/2021    Past Surgical History:   Procedure Laterality Date   ABDOMINAL HYSTERECTOMY N/A 08/15/2014   Procedure: HYSTERECTOMY ABDOMINAL WITH CYSTOSCOPY;  Surgeon: WalSanjuana KavaD;  [facility];  Service: Gynecology;  Laterality: N/A;   BLADDER SUSPENSION N/A 07/30/2021   Procedure: TRANSVAGINAL TAPE (TVT) PROCEDURE;  Surgeon: PinSanjuana KavaD;  [facility];  Laterality: N/A;   CESAREAN SECTION N/A    2010 07/26/2021   CYSTOSCOPY N/A 07/30/2021   Procedure: CYSTOSCOPY;  Surgeon: PinSanjuana KavaD;  [facility];  Laterality: N/A;   CYSTOSCOPY/RETROGRADE/URETEROSCOPY Left 05/23/2019   Procedure: CYSTOSCOPY/RETROGRADE/URETEROSCOPY;  Surgeon: StoAbbie SonsD;  Location: [facility];  Service: Urology;  Laterality: Left;   KNEE ARTHROSCOPY     yr 2020 07/26/2021   LAPAROSCOPIC LYSIS OF ADHESIONS N/A 02/22/2022   Procedure: LAPAROSCOPIC LYSIS OF ADHESIONS;  Surgeon: PinSanjuana KavaD;  [facility];  Laterality: N/A;   LAPAROSCOPY Left 02/22/2022   Procedure: LAPAROSCOPY DIAGNOSTIC and Pelvic Washings;  Surgeon: PinSanjuana KavaD;  [facility] [place];  Laterality: Left;   SHOULDER ARTHROSCOPY WITH SUBACROMIAL DECOMPRESSION AND OPEN ROTATOR C Left 11/27/2015   Procedure: LEFT SHOULDER ARTHROSCOPY WITH SUBACROMIAL DECOMPRESSION, MINI OPEN ROTATOR CUFF REPAIR;  Surgeon: SteNetta CedarsD;  [facility];  Laterality: Left;   TUBAL LIGATION      Current Outpatient Medications  Medication Sig Dispense Refill   cetirizine (ZYRTEC) 10 MG tablet Take 10 mg by mouth daily.     diclofenac Sodium (VOLTAREN) 1 % GEL diclofenac 1 % topical gel  APPLY 2 TO 4 GRAMS TO THE AFFECTED AREA(S) BY TOPICAL ROUTE UP TO 4 TIMES PER DAY AS NEEDED. MAXIMUM DOSE PER AREA IS 16 G/DAY. MAXIMUM TOTAL BODY DOSE IS 62 G/DAY.     FLUoxetine (PROZAC) 20 MG capsule TAKE 1 CAPSULE BY MOUTH DAILY. 90 capsule 0    HYDROcodone-acetaminophen (NORCO/VICODIN) 5-325 MG tablet Take 1 tablet by mouth every 6 (six) hours as needed for moderate pain. 40 tablet 0   liraglutide (VICTOZA) 18 MG/3ML SOPN Inject 1.2 mg into the skin daily.     metoprolol succinate (TOPROL-XL) 25 MG 24 hr tablet TAKE 1/2 TABLET BY MOUTH DAILY. PATIENT MUST SCHEDULE APPOINTMENT FOR FUTURE REFILLS FIRST ATTEMPT. 45 tablet 0   norethindrone (ORTHO MICRONOR) 0.35 MG tablet Take 1 tablet (0.35 mg total) by mouth daily. 28 tablet 11   ondansetron (ZOFRAN) 4 MG tablet Take 1 tablet (4 mg total) by mouth every 8 (eight) hours as needed for nausea or vomiting. 20 tablet  0   valACYclovir (VALTREX) 500 MG tablet Take 500 mg by mouth daily as needed (cold sores).      verapamil (CALAN) 80 MG tablet Take 1 tablet (80 mg total) by mouth 3 (three) times daily. 90 tablet 1   Vitamin D, Ergocalciferol, (DRISDOL) 1.25 MG (50000 UT) CAPS capsule Take one tablet wkly (Patient taking differently: Take 50,000 Units by mouth every 7 (seven) days. Take one tablet wkly) 12 capsule 3   No current facility-administered medications for this visit.    Allergies as of 10/04/2022 - Review Complete 09/27/2022  Allergen Reaction Noted   Sulfa antibiotics Anaphylaxis and Swelling 09/02/2016   Topiramate Swelling 02/28/2019   Duloxetine hcl Other (See Comments) 09/15/2016   Ibuprofen  05/20/2019   Metformin and related Other (See Comments) 04/17/2019   Penicillins Rash 03/07/2014    Family History  Problem Relation Age of Onset   Diabetes Mother    Hypertension Mother    Healthy Father    Breast cancer Maternal Grandmother    Cancer Maternal Grandfather        brain, lung, liver   Other Paternal Grandmother    Heart attack Paternal Grandfather    Breast cancer Maternal Great-grandmother    Colon cancer Neg Hx    Ovarian cancer Neg Hx    Endometrial cancer Neg Hx    Pancreatic cancer Neg Hx    Prostate cancer Neg Hx     Social History    Socioeconomic History   Marital status: Married    Spouse name: Not on file   Number of children: Not on file   Years of education: Not on file   Highest education level: Not on file  Occupational History   Occupation: works a Designer, multimedia  Tobacco Use   Smoking status: Never   Smokeless tobacco: Never  Vaping Use   Vaping Use: Never used  Substance and Sexual Activity   Alcohol use: No   Drug use: No   Sexual activity: Yes    Birth control/protection: Surgical  Other Topics Concern   Not on file  Social History Narrative   Not on file   Social Determinants of Health   Financial Resource Strain: Not on file  Food Insecurity: Not on file  Transportation Needs: Not on file  Physical Activity: Not on file  Stress: Not on file  Social Connections: Not on file  Intimate Partner Violence: Not on file    Review of Systems:    Constitutional: No weight loss, fever or chills Skin: No rash  Cardiovascular: No chest pain   Respiratory: No SOB  Gastrointestinal: See HPI and otherwise negative Genitourinary: No dysuria  Neurological: No headache, dizziness or syncope Musculoskeletal: No new muscle or joint pain Hematologic: No bleeding  Psychiatric: No history of depression or anxiety   Physical Exam:  Vital signs: BP 132/74   Pulse 93   Ht '5\' 9"'$  (1.753 m)   Wt 208 lb 6 oz (94.5 kg)   LMP 08/15/2014 Comment: partial hysterectomy  BMI 30.77 kg/m    Constitutional:   Pleasant overweight Caucasian female appears to be in NAD, Well developed, Well nourished, alert and cooperative Head:  Normocephalic and atraumatic. Eyes:   PEERL, EOMI. No icterus. Conjunctiva pink. Ears:  Normal auditory acuity. Neck:  Supple Throat: Oral cavity and pharynx without inflammation, swelling or lesion.  Respiratory: Respirations even and unlabored. Lungs clear to auscultation bilaterally.   No wheezes, crackles, or rhonchi.  Cardiovascular: Normal S1, S2.  No MRG. Regular  rate and rhythm. No peripheral edema, cyanosis or pallor.  Gastrointestinal:  Soft, nondistended, nontender. No rebound or guarding. Normal bowel sounds. No appreciable masses or hepatomegaly. Rectal:  Not performed.  Msk:  Symmetrical without gross deformities. Without edema, no deformity or joint abnormality.  Neurologic:  Alert and  oriented x4;  grossly normal neurologically.  Skin:   Dry and intact without significant lesions or rashes. Psychiatric: Demonstrates good judgement and reason without abnormal affect or behaviors.  RELEVANT LABS AND IMAGING: CBC    Component Value Date/Time   WBC 7.5 02/21/2022 0915   RBC 4.82 02/21/2022 0915   HGB 13.7 02/21/2022 0915   HGB 13.9 08/12/2020 0925   HCT 41.3 02/21/2022 0915   HCT 42.9 08/12/2020 0925   PLT 187 02/21/2022 0915   PLT 194 08/12/2020 0925   MCV 85.7 02/21/2022 0915   MCV 86 08/12/2020 0925   MCH 28.4 02/21/2022 0915   MCHC 33.2 02/21/2022 0915   RDW 13.2 02/21/2022 0915   RDW 13.3 08/12/2020 0925   LYMPHSABS 2.1 11/21/2021 0756   LYMPHSABS 2.1 10/10/2019 0917   MONOABS 0.5 11/21/2021 0756   EOSABS 0.2 11/21/2021 0756   EOSABS 0.1 10/10/2019 0917   BASOSABS 0.0 11/21/2021 0756   BASOSABS 0.0 10/10/2019 0917    CMP     Component Value Date/Time   NA 137 02/21/2022 0915   NA 135 08/12/2020 0925   K 3.7 02/21/2022 0915   CL 100 02/21/2022 0915   CO2 29 02/21/2022 0915   GLUCOSE 215 (H) 02/21/2022 0915   BUN 11 02/21/2022 0915   BUN 15 08/12/2020 0925   CREATININE 0.69 02/21/2022 0915   CALCIUM 9.0 02/21/2022 0915   PROT 6.8 02/21/2022 0915   PROT 7.2 08/12/2020 0925   ALBUMIN 3.6 02/21/2022 0915   ALBUMIN 4.3 08/12/2020 0925   AST 17 02/21/2022 0915   ALT 16 02/21/2022 0915   ALKPHOS 78 02/21/2022 0915   BILITOT 0.2 (L) 02/21/2022 0915   BILITOT 0.2 08/12/2020 0925   GFRNONAA >60 02/21/2022 0915   GFRAA 120 08/12/2020 0925    Assessment: 1.  Generalized abdominal pain: With below and also continues  throughout the day, sometimes better with a bowel movement; consider relation IBS versus other 2.  Change in bowel habits: With above, seems more frequent throughout the day often having up to 8 stools, though they are soft solid and not diarrhea, previous diagnosis of IBS; likely IBS  Plan: 1.  It has now been 7 years since her last colonoscopy which was apparently normal done at an outside clinic.  I recommend she have a repeat colonoscopy at this point given ongoing symptoms and change in bowel habits to generalized abdominal pain which has been explained by imaging. 2.  Patient scheduled for diagnostic colonoscopy in the [place] with Dr. Hilarie Fredrickson.  Did provide the patient a detailed list of risks for the procedure and she agrees to proceed. Patient is appropriate for endoscopic procedure(s) in the ambulatory ([place]) setting.  3.  Started the patient on Hyoscyamine sulfate 0.125 mg sublingual tabs 1 tab every 4-6 hours as needed for abdominal pain #120 with 3 refills.  (Patient was previously prescribed his medication but does not member taking it). 4.  Patient to follow in clinic per recommendations Dr. Hilarie Fredrickson after time of procedure.  Ellouise Newer, PA-C [facility] 10/04/2022, 10:13 AM  Cc: Lorrene Reid, PA-C

## 2022-10-04 NOTE — Patient Instructions (Addendum)
If you are age 43 or older, your body mass index should be between 23-30. Your Body mass index is 30.77 kg/m. If this is out of the aforementioned range listed, please consider follow up with your Primary Care Provider.  If you are age 19 or younger, your body mass index should be between 19-25. Your Body mass index is 30.77 kg/m. If this is out of the aformentioned range listed, please consider follow up with your Primary Care Provider.   ________________________________________________________  Samantha [name] have been scheduled for a colonoscopy. Please follow written instructions given to you at your visit today.  Please pick up your prep supplies at the pharmacy within the next 1-3 days. If you use inhalers (even only as needed), please bring them with you on the day of your procedure.  We have sent the following medications to your pharmacy for you to pick up at your convenience: Levsin 0.125 mg SL:  dissolve 1 tab under your tongue every 4 to 6 hours as needed for abdominal pain  Thank you for choosing [facility] and for entrusting me with your care, Ellouise Newer, PA-C

## 2022-10-05 ENCOUNTER — Ambulatory Visit (INDEPENDENT_AMBULATORY_CARE_PROVIDER_SITE_OTHER): Payer: Medicare Other | Admitting: Neurology

## 2022-10-05 ENCOUNTER — Institutional Professional Consult (permissible substitution): Payer: Medicare Other | Admitting: Neurology

## 2022-10-05 ENCOUNTER — Encounter: Payer: Self-pay | Admitting: Neurology

## 2022-10-05 VITALS — BP 143/78 | HR 81 | Ht 68.0 in | Wt 210.5 lb

## 2022-10-05 DIAGNOSIS — G4719 Other hypersomnia: Secondary | ICD-10-CM

## 2022-10-05 DIAGNOSIS — F519 Sleep disorder not due to a substance or known physiological condition, unspecified: Secondary | ICD-10-CM | POA: Diagnosis not present

## 2022-10-05 DIAGNOSIS — G478 Other sleep disorders: Secondary | ICD-10-CM | POA: Diagnosis not present

## 2022-10-05 DIAGNOSIS — M2619 Other specified anomalies of jaw-cranial base relationship: Secondary | ICD-10-CM | POA: Diagnosis not present

## 2022-10-05 DIAGNOSIS — E6609 Other obesity due to excess calories: Secondary | ICD-10-CM

## 2022-10-05 DIAGNOSIS — E66811 Obesity, class 1: Secondary | ICD-10-CM

## 2022-10-05 DIAGNOSIS — R0681 Apnea, not elsewhere classified: Secondary | ICD-10-CM

## 2022-10-05 NOTE — Patient Instructions (Signed)
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
## 2022-10-11 ENCOUNTER — Telehealth: Payer: Self-pay | Admitting: Neurology

## 2022-10-11 NOTE — Telephone Encounter (Signed)
HST- UHC medicare/medicaid no auth req.  Patient is scheduled at [facility] For 10/25/22 at 2:30 pm.  Mailed packet to the patient.

## 2022-10-11 NOTE — Telephone Encounter (Signed)
Correction emailed packet to the patient.

## 2022-10-14 NOTE — Progress Notes (Signed)
Addendum: Reviewed and agree with assessment and management plan. Garcia Dalzell M, MD  

## 2022-11-13 NOTE — Progress Notes (Deleted)
Established patient visit   Patient: Samantha [name]   DOB: January 06, 1981   41 y.o. Female  MRN: [id number] Visit Date: 11/14/2022   No chief complaint on file.  Subjective    HPI  Follow up  -migraine headaches  --previously on amitriptyline and Depakote which were not effective  --Maxalt effective for some headaches  -most recently, started Nurtec 75 mg every other day for migraine prevention. Not covered by insurance.  -started on verapamil to reduce frequency and severity of migraines    Medications: Outpatient Medications Prior to Visit  Medication Sig   cetirizine (ZYRTEC) 10 MG tablet Take 10 mg by mouth daily.   diclofenac Sodium (VOLTAREN) 1 % GEL diclofenac 1 % topical gel  APPLY 2 TO 4 GRAMS TO THE AFFECTED AREA(S) BY TOPICAL ROUTE UP TO 4 TIMES PER DAY AS NEEDED. MAXIMUM DOSE PER AREA IS 16 G/DAY. MAXIMUM TOTAL BODY DOSE IS 62 G/DAY.   FLUoxetine (PROZAC) 20 MG capsule TAKE 1 CAPSULE BY MOUTH DAILY.   hyoscyamine (LEVSIN SL) 0.125 MG SL tablet Place 1 tablet (0.125 mg total) under the tongue every 6 (six) hours as needed. Take every 4 to 6 hours as needed   liraglutide (VICTOZA) 18 MG/3ML SOPN Inject 1.2 mg into the skin daily.   metoprolol succinate (TOPROL-XL) 25 MG 24 hr tablet TAKE 1/2 TABLET BY MOUTH DAILY. PATIENT MUST SCHEDULE APPOINTMENT FOR FUTURE REFILLS FIRST ATTEMPT.   norethindrone (ORTHO MICRONOR) 0.35 MG tablet Take 1 tablet (0.35 mg total) by mouth daily.   ondansetron (ZOFRAN) 4 MG tablet Take 1 tablet (4 mg total) by mouth every 8 (eight) hours as needed for nausea or vomiting.   PEG-KCl-NaCl-NaSulf-Na Asc-C (PLENVU) 140 g SOLR Take 1 kit by mouth as directed. Use coupon: BIN: 254982 PNC: CNRX Group: [id number] ID: [id number]   valACYclovir (VALTREX) 500 MG tablet Take 500 mg by mouth daily as needed (cold sores).    verapamil (CALAN) 80 MG tablet Take 1 tablet (80 mg total) by mouth 3 (three) times daily.   Vitamin D, Ergocalciferol, (DRISDOL) 1.25 MG (50000  UT) CAPS capsule Take one tablet wkly (Patient taking differently: Take 50,000 Units by mouth every 7 (seven) days. Take one tablet wkly)   No facility-administered medications prior to visit.    Review of Systems  {Labs (Optional):23779}   Objective    There were no vitals filed for this visit. There is no height or weight on file to calculate BMI.  BP Readings from Last 3 Encounters:  10/05/22 (Abnormal) 143/78  10/04/22 132/74  09/27/22 126/75    Wt Readings from Last 3 Encounters:  10/05/22 210 lb 8 oz (95.5 kg)  10/04/22 208 lb 6 oz (94.5 kg)  09/27/22 211 lb (95.7 kg)    Physical Exam  ***  No results found for any visits on 11/14/22.  Assessment & Plan     Problem List Items Addressed This Visit   None    No follow-ups on file.         Ronnell Freshwater, NP  [facility] [phone] (phone) [phone] (fax)  [place]

## 2022-11-14 ENCOUNTER — Ambulatory Visit: Payer: Medicare Other | Admitting: Nurse Practitioner

## 2022-11-15 ENCOUNTER — Ambulatory Visit: Payer: Medicare Other | Admitting: Physician Assistant

## 2022-11-16 ENCOUNTER — Encounter: Payer: Self-pay | Admitting: Internal Medicine

## 2022-11-21 NOTE — Telephone Encounter (Signed)
HST- UHC medicare/medicaid no auth req -r/s from 10/25/22   Patient is scheduled for 12/20/22 at 8 AM.  Mailed new packet to the patient.

## 2022-11-24 ENCOUNTER — Encounter: Payer: Medicare Other | Admitting: Internal Medicine

## 2022-11-24 ENCOUNTER — Telehealth: Payer: Self-pay | Admitting: Internal Medicine

## 2022-11-24 NOTE — Telephone Encounter (Signed)
Good Morning Dr. Hilarie Fredrickson,  I called this patient at 10:50 am today and she stated she forgot all about it and that she would reschedule.  I will NO SHOW patient.  uhc

## 2022-12-12 DIAGNOSIS — M7918 Myalgia, other site: Secondary | ICD-10-CM | POA: Diagnosis not present

## 2022-12-20 ENCOUNTER — Ambulatory Visit (INDEPENDENT_AMBULATORY_CARE_PROVIDER_SITE_OTHER): Payer: Medicare Other | Admitting: Neurology

## 2022-12-20 DIAGNOSIS — G4719 Other hypersomnia: Secondary | ICD-10-CM

## 2022-12-20 DIAGNOSIS — R0681 Apnea, not elsewhere classified: Secondary | ICD-10-CM

## 2022-12-20 DIAGNOSIS — G478 Other sleep disorders: Secondary | ICD-10-CM

## 2022-12-20 DIAGNOSIS — G4733 Obstructive sleep apnea (adult) (pediatric): Secondary | ICD-10-CM

## 2022-12-20 DIAGNOSIS — M2619 Other specified anomalies of jaw-cranial base relationship: Secondary | ICD-10-CM

## 2022-12-20 DIAGNOSIS — F519 Sleep disorder not due to a substance or known physiological condition, unspecified: Secondary | ICD-10-CM

## 2022-12-20 DIAGNOSIS — E6609 Other obesity due to excess calories: Secondary | ICD-10-CM

## 2022-12-21 NOTE — Progress Notes (Signed)
See procedure note.

## 2022-12-26 ENCOUNTER — Telehealth: Payer: Self-pay | Admitting: Internal Medicine

## 2022-12-26 ENCOUNTER — Telehealth: Payer: Self-pay | Admitting: Neurology

## 2022-12-26 ENCOUNTER — Encounter: Payer: Medicare Other | Admitting: Internal Medicine

## 2022-12-26 DIAGNOSIS — R194 Change in bowel habit: Secondary | ICD-10-CM

## 2022-12-26 DIAGNOSIS — Z789 Other specified health status: Secondary | ICD-10-CM

## 2022-12-26 DIAGNOSIS — G4733 Obstructive sleep apnea (adult) (pediatric): Secondary | ICD-10-CM

## 2022-12-26 DIAGNOSIS — G4719 Other hypersomnia: Secondary | ICD-10-CM

## 2022-12-26 DIAGNOSIS — R0681 Apnea, not elsewhere classified: Secondary | ICD-10-CM

## 2022-12-26 DIAGNOSIS — G478 Other sleep disorders: Secondary | ICD-10-CM

## 2022-12-26 NOTE — Addendum Note (Signed)
Addended by: Darleen Crocker on: 12/26/2022 04:03 PM   Modules accepted: Orders

## 2022-12-26 NOTE — Telephone Encounter (Signed)
Called patient to discuss sleep study results. No answer at this time. LVM for the patient to call back.   

## 2022-12-26 NOTE — Telephone Encounter (Signed)
Referral for Dentistry fax to [facility].  Phone: [phone], Fax: [phone].

## 2022-12-26 NOTE — Telephone Encounter (Signed)
This patient saw Dr. Brett Fairy for sleep evaluation on 10/05/2022.  I read the HST from 12/20/2022 on Dr. Edwena Felty behalf:    Please call and notify the patient that the recent home sleep test showed obstructive sleep apnea in the mild range, borderline even, by AHI criteria.  Treatment options include AutoPap therapy, weight loss and avoiding sleeping on the back, or a dental device.  Since she had difficulty tolerating a CPAP machine in the past, she may be a good candidate for dental device.  If she would like to explore the dental option for treating sleep apnea, we can facilitate with a referral to dentistry.  She can also wait and discuss further with Dr. Brett Fairy upon her return if she would prefer.  If she would like to trial AutoPap therapy at home, I would be happy to put an order in on Dr. Edwena Felty behalf for an AutoPap machine. Let me know how she would like to proceed.  Star Age, MD, PhD [facility])

## 2022-12-26 NOTE — Telephone Encounter (Signed)
Pt returned call and I was able to review the sleep study with her In detail. Based off the study indicating mild sleep apnea, Dr Rexene Alberts recommends treatment with auto CPAP or dental device. Pt has tried CPAP before and does not want to try that route but would be interested in pursing the dental device option. Advised a referral for dentist will be placed for her. Advised if she has not heard from a dental office within 2 wks or so, give [place] a call back. Pt verbalized understanding. Pt had no questions at this time but was encouraged to call back if questions arise.

## 2022-12-26 NOTE — Telephone Encounter (Signed)
Hello Dr Hilarie Fredrickson,    I called patient regarding her procedure appointment today, I spoke with patient's husband and he stated that they weren't aware of today's appointment.

## 2022-12-26 NOTE — Telephone Encounter (Signed)
No show colonoscopy  Last visit was 10/04/22 with Vicie Mutters, [facility] She no-showed the visit on 11/24/22  Will need to be seen before rescheduling procedure(s) If 3rd consecutive no-show patient will need to seek GI care elsewhere

## 2022-12-26 NOTE — Procedures (Signed)
   [facility]  HOME SLEEP TEST (Watch PAT) REPORT  STUDY DATE: 12/20/2022  DOB: 1981-01-11  MRN: [id number]  ORDERING CLINICIAN: Star Age, MD, PhD (Study was interpreted on behalf of Dr. Brett Fairy)     REFERRING CLINICIAN: Lorrene Reid, PA-C (PCP), Dr. Brett Fairy (sleep)  CLINICAL INFORMATION/HISTORY: 42 year old female with an underlying medical history of anemia, angioedema, anxiety, diabetes, fatty liver, reflux disease, headaches, history of kidney stones, and obesity, who was previously diagnosed with obstructive sleep apnea and placed on PAP therapy.  She had difficulty tolerating PAP therapy.  She presents for re-evaluation.  Epworth sleepiness score: 12/24.  BMI: 32 kg/m  FINDINGS:   Sleep Summary:   Total Recording Time (hours, min): 8 hours, 44 min  Total Sleep Time (hours, min):  7 hours, 8 min  Percent REM (%):    24.4%   Respiratory Indices:   Calculated pAHI (per hour):  5.1/hour         REM pAHI:    10.9/hour       NREM pAHI: 3.2/hour  Central pAHI: 0.1/hour  Oxygen Saturation Statistics:    Oxygen Saturation (%) Mean: 94%   Minimum oxygen saturation (%):                 85%   O2 Saturation Range (%): 85-98%    O2 Saturation (minutes) <=88%: 0.1 min  Pulse Rate Statistics:   Pulse Mean (bpm):    74/min    Pulse Range (63-106/min)   IMPRESSION: OSA (obstructive sleep apnea), mild  RECOMMENDATION:  This home sleep test demonstrates overall mild/borderline obstructive sleep apnea with a total AHI of 5.1/hour and O2 nadir of 85%. Snoring was detected, mostly in the mild to moderate range, rarely louder.  Treatment with an AutoPap machine can be considered.  Alternative treatment options may include weight loss (where appropriate) along with avoidance of the supine sleep position (if possible), or an oral appliance in appropriate candidates.  These different avenues will be discussed with the patient. Please note that  untreated obstructive sleep apnea may carry additional perioperative morbidity. Patients with significant obstructive sleep apnea should receive perioperative PAP therapy and the surgeons and particularly the anesthesiologist should be informed of the diagnosis and the severity of the sleep disordered breathing. The patient should be cautioned not to drive, work at heights, or operate dangerous or heavy equipment when tired or sleepy. Review and reiteration of good sleep hygiene measures should be pursued with any patient. Other causes of the patient's symptoms, including circadian rhythm disturbances, an underlying mood disorder, medication effect and/or an underlying medical problem cannot be ruled out based on this test. Clinical correlation is recommended.  The patient and her referring provider will be notified of the test results. The patient will be seen in follow up in sleep clinic at [facility], as necessary.  I certify that I have reviewed the raw data recording prior to the issuance of this report in accordance with the standards of the American Academy of Sleep Medicine (AASM).  INTERPRETING PHYSICIAN:   Star Age, MD, PhD Medical Director, [facility]) [place], ABPN (Neurology and Sleep)   [facility] [address], [place], [place] [address] [phone]

## 2022-12-29 ENCOUNTER — Ambulatory Visit: Payer: 59 | Admitting: Physical Therapy

## 2022-12-29 NOTE — Therapy (Deleted)
OUTPATIENT PHYSICAL THERAPY FEMALE PELVIC EVALUATION   Patient Name: Samantha [name] MRN: [id number] DOB:1981-01-21, 42 y.o., female Today's Date: 12/29/2022  END OF SESSION:   Past Medical History:  Diagnosis Date   Anemia    Angio-edema    Anxiety    Chest pain    a. 06/2020 MV: EF 55%, no ischemia or infarct.  No significant coronary calcium on attenuation correction CT.   Complication of anesthesia    slow to wake up 07/26/2021   COVID    sept 2021 flu like symptoms and cough.   lasted 5 days. 07/26/2021   Depression    rx presc but not taken   Diabetes mellitus without complication ([place])    Dilated aortic root ([place])    a. 05/2020 Echo: Ao root 48m; b. 08/2021 Echo: Ao root 460m Asc Ao 4083m  Fatty liver    Fibromyalgia    GERD (gastroesophageal reflux disease)    no meds   H/o Sinus infection    Headache(784.0)    tension and with anxiety hx. complicated migraine 08/[id number] steatosis    History of kidney stones    MVP (mitral valve prolapse)    a. Pt told MVP present as child; b. 05/2020 Echo: EF 55-60%, no rwma, nl RV size/fxn, triv MR, mild AI. Ao root 73m7m. 05/2021 Echo: EF 60-65%, no rwma, GrI DD, nl RV fxn, mildly dil LA, no MR, Ao root 42mm76mc AO 40mm.45mvarian cyst    Pneumonia    hx   Seizures (HCC)  [place]. non epileptic events per epilepsy monitoring unit; b. last seizure July 2015- does not convulse but has a sleep effect. pt states she doesnt have seizures, she has complicated migraines and causes her to have sleep like effective per pt. [facility] Rosali[facility]is Rangehe left the practiced and doesnt have new dr. 07/27/2021   Sleep apnea    has cpap does not use   Wears glasses    07/26/2021   Past Surgical History:  Procedure Laterality Date   ABDOMINAL HYSTERECTOMY N/A 08/15/2014   Procedure: HYSTERECTOMY ABDOMINAL WITH CYSTOSCOPY;  Surgeon: Walda Sanjuana Kava [facility]: [place];   Laterality: N/A;   BLADDER SUSPENSION N/A 07/30/2021   Procedure: TRANSVAGINAL TAPE (TVT) PROCEDURE;  Surgeon: Pinn, Sanjuana Kava [facility] [place];  Laterality: N/A;   CESAREAN SECTION N/A    2010 07/26/2021   CYSTOSCOPY N/A 07/30/2021   Procedure: CYSTOSCOPY;  Surgeon: Pinn, Sanjuana Kava [facility] [place];  Laterality: N/A;   CYSTOSCOPY/RETROGRADE/URETEROSCOPY Left 05/23/2019   Procedure: CYSTOSCOPY/RETROGRADE/URETEROSCOPY;  Surgeon: StoiofAbbie Sons Location: [facility];  Service: Urology;  Laterality: Left;   KNEE ARTHROSCOPY     yr 2020 07/26/2021   LAPAROSCOPIC LYSIS OF ADHESIONS N/A 02/22/2022   Procedure: LAPAROSCOPIC LYSIS OF ADHESIONS;  Surgeon: Pinn, Sanjuana Kava [facility];  Laterality: N/A;   LAPAROSCOPY Left 02/22/2022   Procedure: LAPAROSCOPY DIAGNOSTIC and Pelvic Washings;  Surgeon: Pinn, Sanjuana Kava [facility]: [place]necology;  Laterality: Left;   SHOULDER ARTHROSCOPY WITH SUBACROMIAL DECOMPRESSION AND OPEN ROTATOR C Left 11/27/2015   Procedure: LEFT SHOULDER ARTHROSCOPY WITH SUBACROMIAL DECOMPRESSION, MINI OPEN ROTATOR CUFF REPAIR;  Surgeon: Steve Netta [place]ars [facility]opedics;  Laterality: Left;   TUBAL  LIGATION     Patient Active Problem List   Diagnosis Date Noted   Witnessed episode of apnea 10/05/2022   Sleep choking syndrome 10/05/2022   Excessive daytime sleepiness 10/05/2022   Non-restorative sleep 10/05/2022   Retrognathia 10/05/2022   Class 1 obesity due to excess calories with body mass index (BMI) of 32.0 to 32.9 in adult 10/05/2022   Upper respiratory tract infection due to COVID-19 virus 12/06/2021   Acute otitis media 12/06/2021   Chest pain 04/08/2020   SOB (shortness of breath) 04/08/2020   Palpitations 04/08/2020   Essential hypertension 06/12/2019   Mood disorder ([place]) 06/12/2019   Ureteral calculus [id number]    Renal colic [id number]   Hydronephrosis with urinary obstruction due to ureteral calculus 05/20/2019   Irritability and anger 03/06/2019   Heartburn 03/04/2019   Chronic rhinitis 03/04/2019   Adverse food reaction 03/04/2019   Globus sensation 02/28/2019   Finger pain, left 01/01/2019   Allergy history unknown- possible throat closure sensation to certain foods [id number]   Neuropathic pain 09/04/2018   Chronic radicular pain of lower back 09/04/2018   Lumbar foraminal stenosis-L5-S1 09/04/2018   Osteoarthritis of spine with radiculopathy, lumbar region 09/04/2018   Obesity, Class I, BMI 30-34.9 09/04/2018   Piriformis syndrome, left 07/12/2018   Single episode of elevated blood pressure 07/12/2018   Hypertriglyceridemia 04/11/2018   Low level of high density lipoprotein (HDL) 04/11/2018   Vitamin D insufficiency 04/11/2018   Epigastric abdominal pain with N 04/06/2018   History of vitamin D deficiency 04/06/2018   Spondylosis without myelopathy or radiculopathy, lumbar region [id number]   Acute folliculitis- likely due to MRSA, they are draining 06/19/2017   Personal history of MRSA (methicillin resistant Staphylococcus aureus) 06/19/2017   Chronic diarrhea  06/19/2017   Contact dermatitis and eczema- due to tape\ Band-Aid 06/19/2017   Abdominal pain, LUQ 05/02/2017   Abdominal pain, LLQ 05/02/2017   Hx of diarrhea 05/02/2017   Environmental and seasonal allergies 09/29/2016   GERD (gastroesophageal reflux disease) 09/29/2016   Bipolar 1 disorder - Anxiety 09/28/2016   Fibromyalgia 08/11/2016   Type 2 diabetes mellitus with complication, without long-term current use of insulin ([place]) 12/29/2015   OSA on CPAP 12/08/2014   Obstructive sleep apnea syndrome, moderate 11/24/2014   Fatty liver 09/05/2014   S/P abdominal hysterectomy 08/15/2014   Bladder dysfunction 04/30/2014   Nephrolithiasis 03/04/2014   Agenesis, corpus callosum ([place]) 02/17/2014   GAD (generalized anxiety  disorder) 02/17/2014   History of blood transfusion 02/17/2014   Obesity (BMI 30-39.9) 02/17/2014    PCP: ***  REFERRING PROVIDER: Truddie Hidden, MD   REFERRING DIAG:  N39.41 (ICD-10-CM) - Urge incontinence  M79.18 (ICD-10-CM) - Myalgia, other site  M62.89 (ICD-10-CM) - Other specified disorders of muscle    THERAPY DIAG:  No diagnosis found.  Rationale for Evaluation and Treatment: Rehabilitation  ONSET DATE: ***  SUBJECTIVE:  SUBJECTIVE STATEMENT: *** Fluid intake: {Yes/No:304960894}   PAIN:  Are you having pain? {yes/no:20286} NPRS scale: ***/10 Pain location: {pelvic pain location:27098}  Pain type: {type:313116} Pain description: {PAIN DESCRIPTION:21022940}   Aggravating factors: *** Relieving factors: ***  PRECAUTIONS: {Therapy precautions:24002}  WEIGHT BEARING RESTRICTIONS: {Yes ***/No:24003}  FALLS:  Has patient fallen in last 6 months? {fallsyesno:27318}  LIVING ENVIRONMENT: Lives with: {OPRC lives with:25569::"lives with their family"} Lives in: {Lives in:25570} Stairs: {opstairs:27293} Has following equipment at home: {Assistive devices:23999}  OCCUPATION: ***  PLOF: {PLOF:24004}  PATIENT GOALS: ***  PERTINENT HISTORY:  *** Sexual abuse: {Yes/No:304960894}  BOWEL MOVEMENT: Pain with bowel movement: {yes/no:20286} Type of bowel movement:{PT BM type:27100} Fully empty rectum: {Yes/No:304960894} Leakage: {Yes/No:304960894} Pads: {Yes/No:304960894} Fiber supplement: {Yes/No:304960894}  URINATION: Pain with urination: {yes/no:20286} Fully empty bladder: {Yes/No:304960894} Stream: {PT urination:27102} Urgency: {Yes/No:304960894} Frequency: *** Leakage: {PT leakage:27103} Pads: {Yes/No:304960894}  INTERCOURSE: Pain with intercourse:  {pain with intercourse PA:27099} Ability to have vaginal penetration:  {Yes/No:304960894} Climax: *** Marinoff Scale: ***/3  PREGNANCY: Vaginal deliveries *** Tearing {Yes***/No:304960894} C-section deliveries *** Currently pregnant {Yes***/No:304960894}  PROLAPSE: {PT prolapse:27101}   OBJECTIVE:   DIAGNOSTIC FINDINGS:  ***  PATIENT SURVEYS:  {rehab surveys:24030}  PFIQ-7 ***  COGNITION: Overall cognitive status: {cognition:24006}     SENSATION: Light touch: {intact/deficits:24005} Proprioception: {intact/deficits:24005}  MUSCLE LENGTH: Hamstrings: Right *** deg; Left *** deg Thomas test: Right *** deg; Left *** deg  LUMBAR SPECIAL TESTS:  {lumbar special test:25242}  FUNCTIONAL TESTS:  {Functional tests:24029}  GAIT: Distance walked: *** Assistive device utilized: {Assistive devices:23999} Level of assistance: {Levels of assistance:24026} Comments: ***  POSTURE: {posture:25561}  PELVIC ALIGNMENT:  LUMBARAROM/PROM:  A/PROM A/PROM  eval  Flexion   Extension   Right lateral flexion   Left lateral flexion   Right rotation   Left rotation    (Blank rows = not tested)  LOWER EXTREMITY ROM:  {AROM/PROM:27142} ROM Right eval Left eval  Hip flexion    Hip extension    Hip abduction    Hip adduction    Hip internal rotation    Hip external rotation    Knee flexion    Knee extension    Ankle dorsiflexion    Ankle plantarflexion    Ankle inversion    Ankle eversion     (Blank rows = not tested)  LOWER EXTREMITY MMT:  MMT Right eval Left eval  Hip flexion    Hip extension    Hip abduction    Hip adduction    Hip internal rotation    Hip external rotation    Knee flexion    Knee extension    Ankle dorsiflexion    Ankle plantarflexion    Ankle inversion    Ankle eversion     PALPATION:   General  ***                External Perineal Exam ***                             Internal Pelvic Floor ***  Patient confirms  identification and approves PT to assess internal pelvic floor and treatment {yes/no:20286}  PELVIC MMT:   MMT eval  Vaginal   Internal Anal Sphincter   External Anal Sphincter   Puborectalis   Diastasis Recti   (Blank rows = not tested)        TONE: ***  PROLAPSE: ***  TODAY'S TREATMENT:  DATE: ***  EVAL ***   PATIENT EDUCATION:  Education details: *** Person educated: {Person educated:25204} Education method: {Education Method:25205} Education comprehension: {Education Comprehension:25206}  HOME EXERCISE PROGRAM: ***  ASSESSMENT:  CLINICAL IMPRESSION: Patient is a *** y.o. *** who was seen today for physical therapy evaluation and treatment for ***.   OBJECTIVE IMPAIRMENTS: {opptimpairments:25111}.   ACTIVITY LIMITATIONS: {activitylimitations:27494}  PARTICIPATION LIMITATIONS: {participationrestrictions:25113}  PERSONAL FACTORS: {Personal factors:25162} are also affecting patient's functional outcome.   REHAB POTENTIAL: {rehabpotential:25112}  CLINICAL DECISION MAKING: {clinical decision making:25114}  EVALUATION COMPLEXITY: {Evaluation complexity:25115}   GOALS: Goals reviewed with patient? {yes/no:20286}  SHORT TERM GOALS: Target date: ***  *** Baseline: Goal status: {GOALSTATUS:25110}  2.  *** Baseline:  Goal status: {GOALSTATUS:25110}  3.  *** Baseline:  Goal status: {GOALSTATUS:25110}  4.  *** Baseline:  Goal status: {GOALSTATUS:25110}  5.  *** Baseline:  Goal status: {GOALSTATUS:25110}  6.  *** Baseline:  Goal status: {GOALSTATUS:25110}  LONG TERM GOALS: Target date: ***  *** Baseline:  Goal status: {GOALSTATUS:25110}  2.  *** Baseline:  Goal status: {GOALSTATUS:25110}  3.  *** Baseline:  Goal status: {GOALSTATUS:25110}  4.  *** Baseline:  Goal status: {GOALSTATUS:25110}  5.   *** Baseline:  Goal status: {GOALSTATUS:25110}  6.  *** Baseline:  Goal status: {GOALSTATUS:25110}  PLAN:  PT FREQUENCY: {rehab frequency:25116}  PT DURATION: {rehab duration:25117}  PLANNED INTERVENTIONS: {rehab planned interventions:25118::"Therapeutic exercises","Therapeutic activity","Neuromuscular re-education","Balance training","Gait training","Patient/Family education","Self Care","Joint mobilization"}  PLAN FOR NEXT SESSION: ***   Camillo Flaming Keyly Baldonado, PT 12/29/2022, 7:52 AM

## 2023-01-20 ENCOUNTER — Emergency Department
Admission: EM | Admit: 2023-01-20 | Discharge: 2023-01-20 | Disposition: A | Discharge status: Home / Self Care | Payer: 59 | Attending: Emergency Medicine | Admitting: Emergency Medicine

## 2023-01-20 ENCOUNTER — Other Ambulatory Visit: Payer: Self-pay

## 2023-01-20 DIAGNOSIS — R2232 Localized swelling, mass and lump, left upper limb: Secondary | ICD-10-CM | POA: Diagnosis not present

## 2023-01-20 DIAGNOSIS — M7989 Other specified soft tissue disorders: Secondary | ICD-10-CM

## 2023-01-20 MED ORDER — DEXAMETHASONE SODIUM PHOSPHATE 10 MG/ML IJ SOLN
10.0000 mg | Freq: Once | INTRAMUSCULAR | Status: AC
  Administered 2023-01-20: 10 mg via INTRAMUSCULAR
  Filled 2023-01-20: qty 1

## 2023-01-20 MED ORDER — PREDNISONE 50 MG PO TABS: ORAL_TABLET | ORAL | 0 refills | Status: DC

## 2023-01-20 MED ORDER — FAMOTIDINE 20 MG PO TABS
40.0000 mg | ORAL_TABLET | Freq: Once | ORAL | Status: AC
  Administered 2023-01-20: 40 mg via ORAL
  Filled 2023-01-20: qty 2

## 2023-01-20 MED ORDER — DIPHENHYDRAMINE HCL 25 MG PO CAPS
50.0000 mg | ORAL_CAPSULE | Freq: Once | ORAL | Status: AC
  Administered 2023-01-20: 50 mg via ORAL
  Filled 2023-01-20: qty 2

## 2023-01-20 NOTE — ED Triage Notes (Signed)
Pt to ED from home. Pt helped move some wood today and heft left wrist and hand are swollen and itching. Pt denies any injury or trauma to it. Unknown if would could have punctured the skin or not. Pt has moderate swelling to left palm but no redness. Pt is Caox4 and in no acute distress at this time.

## 2023-01-20 NOTE — Discharge Instructions (Addendum)
Take 1 tablet of prednisone once daily for the next 5 days. Take 50 mg of Benadryl every 8 hours until symptoms resolved. Take 40 mg of Pepcid once daily until symptoms completely resolved.

## 2023-01-20 NOTE — ED Provider Notes (Signed)
[facility] Provider Note  Patient Contact: 10:20 PM (approximate)   History   Hand Injury (Today LEFT)   HPI  Samantha [name] is a 42 y.o. female presents to the emergency department with left hand swelling after patient removed several blocks of wood.  Patient describes affected areas being extremely pruritic.  No cough, shortness of breath, vomiting or diarrhea.  No similar allergies in the past.      Physical Exam   Triage Vital Signs: ED Triage Vitals [01/20/23 1950]  Enc Vitals Group     BP (!) 154/115     Pulse Rate 74     Resp 16     Temp 98.3 F (36.8 C)     Temp Source Oral     SpO2 98 %     Weight 210 lb (95.3 kg)     Height 5' 8"$  (1.727 m)     Head Circumference      Peak Flow      Pain Score 0     Pain Loc      Pain Edu?      Excl. in [place]?     Most recent vital signs: Vitals:   01/20/23 1950  BP: (!) 154/115  Pulse: 74  Resp: 16  Temp: 98.3 F (36.8 C)  SpO2: 98%     General: Alert and in no acute distress. Eyes:  PERRL. EOMI. Head: No acute traumatic findings ENT:      Nose: No congestion/rhinnorhea.      Mouth/Throat: Mucous membranes are moist. Neck: No stridor. No cervical spine tenderness to palpation. Cardiovascular:  Good peripheral perfusion Respiratory: Normal respiratory effort without tachypnea or retractions. Lungs CTAB. Good air entry to the bases with no decreased or absent breath sounds. Gastrointestinal: Bowel sounds 4 quadrants. Soft and nontender to palpation. No guarding or rigidity. No palpable masses. No distention. No CVA tenderness. Musculoskeletal: Full range of motion to all extremities.  Neurologic:  No gross focal neurologic deficits are appreciated.  Skin: Patient has mild edema along the thenar eminence of the right hand.    ED Results / Procedures / Treatments   Labs (all labs ordered are listed, but only abnormal results are displayed) Labs Reviewed - No data to  display     PROCEDURES:  Critical Care performed: No  Procedures   MEDICATIONS ORDERED IN ED: Medications  dexamethasone (DECADRON) injection 10 mg (10 mg Intramuscular Given 01/20/23 2035)  diphenhydrAMINE (BENADRYL) capsule 50 mg (50 mg Oral Given 01/20/23 2031)  famotidine (PEPCID) tablet 40 mg (40 mg Oral Given 01/20/23 2031)     IMPRESSION / MDM / ASSESSMENT AND PLAN / ED COURSE  I reviewed the triage vital signs and the nursing notes.                              Assessment and plan Allergic reaction 42 year old female presents to the emergency department with swelling of the left hand.  Patient was hypertensive at triage but vital signs were otherwise reassuring.  Patient was given Benadryl, Decadron and famotidine and was discharged with prednisone.  Return precautions were given to return with new or worsening symptoms.  All patient questions were answered.   FINAL CLINICAL IMPRESSION(S) / ED DIAGNOSES   Final diagnoses:  Swelling of left hand     Rx / DC Orders   ED Discharge Orders  Ordered    predniSONE (DELTASONE) 50 MG tablet        01/20/23 2019             Note:  This document was prepared using Dragon voice recognition software and may include unintentional dictation errors.   Vallarie Mare [place], Hershal Coria 01/20/23 2222    Harvest Dark, MD 01/21/23 2244

## 2023-01-24 ENCOUNTER — Telehealth: Payer: Self-pay

## 2023-01-24 NOTE — Telephone Encounter (Signed)
     Patient  visit on 2/16  at [place]                     Have you been able to follow up with your primary care physician? No  The patient was or was not able to obtain any needed medicine or equipment. yes  Are there diet recommendations that you are having difficulty following?na   Patient expresses understanding of discharge instructions and education provided has no other needs at this time. Yes      [facility] [phone] [address], [place], [place] [address] Phone: [phone] Email: [email or web address]

## 2023-02-08 ENCOUNTER — Ambulatory Visit: Payer: 59 | Admitting: Physical Therapy

## 2023-02-21 NOTE — Therapy (Unsigned)
OUTPATIENT PHYSICAL THERAPY FEMALE PELVIC EVALUATION   Patient Name: ARLYCE WOLLARD MRN: [id number] DOB:1980/12/21, 42 y.o., female Today's Date: 02/22/2023  END OF SESSION:  PT End of Session - 02/22/23 1134     Visit Number 1    Date for PT Re-Evaluation 05/17/23    Authorization Type [employer] medicare    Authorization - Visit Number 1    Authorization - Number of Visits 10    PT Start Time 0815    PT Stop Time 0845    PT Time Calculation (min) 30 min    Activity Tolerance Patient tolerated treatment well    Behavior During Therapy WFL for tasks assessed/performed             Past Medical History:  Diagnosis Date   Anemia    Angio-edema    Anxiety    Chest pain    a. 06/2020 MV: EF 55%, no ischemia or infarct.  No significant coronary calcium on attenuation correction CT.   Complication of anesthesia    slow to wake up 07/26/2021   COVID    sept 2021 flu like symptoms and cough.   lasted 5 days. 07/26/2021   Depression    rx presc but not taken   Diabetes mellitus without complication ([place])    Dilated aortic root ([place])    a. 05/2020 Echo: Ao root 75mm; b. 08/2021 Echo: Ao root 62mm, Asc Ao 24mm.   Fatty liver    Fibromyalgia    GERD (gastroesophageal reflux disease)    no meds   H/o Sinus infection    Headache(784.0)    tension and with anxiety hx. complicated migraine [id number]   Hepatic steatosis    History of kidney stones    MVP (mitral valve prolapse)    a. Pt told MVP present as child; b. 05/2020 Echo: EF 55-60%, no rwma, nl RV size/fxn, triv MR, mild AI. Ao root 86mm; c. 05/2021 Echo: EF 60-65%, no rwma, GrI DD, nl RV fxn, mildly dil LA, no MR, Ao root 46mm, Asc AO 48mm.   Ovarian cyst    Pneumonia    hx   Seizures ([place])    a. non epileptic events per epilepsy monitoring unit; b. last seizure July 2015- does not convulse but has a sleep effect. pt states she doesnt have seizures, she has complicated migraines and causes her to have sleep like effective per pt.  pt goes to [facility] and use to see Dr. Hillis Range but she left the practiced and doesnt have new dr. 07/27/2021   Sleep apnea    has cpap does not use   Wears glasses    07/26/2021   Past Surgical History:  Procedure Laterality Date   ABDOMINAL HYSTERECTOMY N/A 08/15/2014   Procedure: HYSTERECTOMY ABDOMINAL WITH CYSTOSCOPY;  Surgeon: Sanjuana Kava, MD;  Location: [facility];  Service: Gynecology;  Laterality: N/A;   BLADDER SUSPENSION N/A 07/30/2021   Procedure: TRANSVAGINAL TAPE (TVT) PROCEDURE;  Surgeon: Sanjuana Kava, MD;  Location: [place];  Service: Gynecology;  Laterality: N/A;   CESAREAN SECTION N/A    2010 07/26/2021   CYSTOSCOPY N/A 07/30/2021   Procedure: CYSTOSCOPY;  Surgeon: Sanjuana Kava, MD;  Location: [facility];  Service: Gynecology;  Laterality: N/A;   CYSTOSCOPY/RETROGRADE/URETEROSCOPY Left 05/23/2019   Procedure: CYSTOSCOPY/RETROGRADE/URETEROSCOPY;  Surgeon: Abbie Sons, MD;  Location: [facility];  Service: Urology;  Laterality: Left;   KNEE ARTHROSCOPY     yr 2020 07/26/2021   LAPAROSCOPIC LYSIS  OF ADHESIONS N/A 02/22/2022   Procedure: LAPAROSCOPIC LYSIS OF ADHESIONS;  Surgeon: Sanjuana Kava, MD;  Location: [place];  Service: Gynecology;  Laterality: N/A;   LAPAROSCOPY Left 02/22/2022   Procedure: LAPAROSCOPY DIAGNOSTIC and Pelvic Washings;  Surgeon: Sanjuana Kava, MD;  Location: [place];  Service: Gynecology;  Laterality: Left;   SHOULDER ARTHROSCOPY WITH SUBACROMIAL DECOMPRESSION AND OPEN ROTATOR C Left 11/27/2015   Procedure: LEFT SHOULDER ARTHROSCOPY WITH SUBACROMIAL DECOMPRESSION, MINI OPEN ROTATOR CUFF REPAIR;  Surgeon: Netta Cedars, MD;  Location: [place];  Service: Orthopedics;  Laterality: Left;   TUBAL LIGATION     Patient Active Problem List   Diagnosis Date Noted   Witnessed episode of apnea 10/05/2022   Sleep choking syndrome 10/05/2022   Excessive daytime sleepiness 10/05/2022   Non-restorative sleep 10/05/2022   Retrognathia  10/05/2022   Class 1 obesity due to excess calories with body mass index (BMI) of 32.0 to 32.9 in adult 10/05/2022   Upper respiratory tract infection due to COVID-19 virus 12/06/2021   Acute otitis media 12/06/2021   Chest pain 04/08/2020   SOB (shortness of breath) 04/08/2020   Palpitations 04/08/2020   Essential hypertension 06/12/2019   Mood disorder ([place]) 06/12/2019   Ureteral calculus 76/19/5093   Renal colic 26/71/2458   Hydronephrosis with urinary obstruction due to ureteral calculus 05/20/2019   Irritability and anger 03/06/2019   Heartburn 03/04/2019   Chronic rhinitis 03/04/2019   Adverse food reaction 03/04/2019   Globus sensation 02/28/2019   Finger pain, left 01/01/2019   Allergy history unknown- possible throat closure sensation to certain foods 09/98/3382   Neuropathic pain 09/04/2018   Chronic radicular pain of lower back 09/04/2018   Lumbar foraminal stenosis-L5-S1 09/04/2018   Osteoarthritis of spine with radiculopathy, lumbar region 09/04/2018   Obesity, Class I, BMI 30-34.9 09/04/2018   Piriformis syndrome, left 07/12/2018   Single episode of elevated blood pressure 07/12/2018   Hypertriglyceridemia 04/11/2018   Low level of high density lipoprotein (HDL) 04/11/2018   Vitamin D insufficiency 04/11/2018   Epigastric abdominal pain with N 04/06/2018   History of vitamin D deficiency 04/06/2018   Spondylosis without myelopathy or radiculopathy, lumbar region 50/53/9767   Acute folliculitis- likely due to MRSA, they are draining 06/19/2017   Personal history of MRSA (methicillin resistant Staphylococcus aureus) 06/19/2017   Chronic diarrhea  06/19/2017   Contact dermatitis and eczema- due to tape\ Band-Aid 06/19/2017   Abdominal pain, LUQ 05/02/2017   Abdominal pain, LLQ 05/02/2017   Hx of diarrhea 05/02/2017   Environmental and seasonal allergies 09/29/2016   GERD (gastroesophageal reflux disease) 09/29/2016   Bipolar 1 disorder - Anxiety 09/28/2016    Fibromyalgia 08/11/2016   Type 2 diabetes mellitus with complication, without long-term current use of insulin ([place]) 12/29/2015   OSA on CPAP 12/08/2014   Obstructive sleep apnea syndrome, moderate 11/24/2014   Fatty liver 09/05/2014   S/P abdominal hysterectomy 08/15/2014   Bladder dysfunction 04/30/2014   Nephrolithiasis 03/04/2014   Agenesis, corpus callosum ([place]) 02/17/2014   GAD (generalized anxiety disorder) 02/17/2014   History of blood transfusion 02/17/2014   Obesity (BMI 30-39.9) 02/17/2014    PCP: None  REFERRING PROVIDER: Truddie Hidden, MD   REFERRING DIAG:  N39.41 (ICD-10-CM) - Urge incontinence  M79.18 (ICD-10-CM) - Myalgia, other site  M62.89 (ICD-10-CM) - Other specified disorders of muscle    THERAPY DIAG:  Muscle weakness (generalized) - Plan: PT plan of care cert/re-cert  Other lack of coordination - Plan: PT plan of care cert/re-cert  Rationale for Evaluation and Treatment: Rehabilitation  ONSET DATE: 2022  SUBJECTIVE:                                                                                                                                                                                           SUBJECTIVE STATEMENT: Patient reports her urinary leakage has become worse in the past 1.5 years. She had a bladder tack then came loose and the leakage increased. Several times did not make it to the bathroom. Patient is scheduled for a mesh revision in [place] on 03/09/23.  Fluid intake: Yes: water and sweet tea    PAIN:  Are you having pain? No pelvic pain  PRECAUTIONS: None  WEIGHT BEARING RESTRICTIONS: No  FALLS:  Has patient fallen in last 6 months? 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   PERTINENT HISTORY:  Abdominal hysterectomy 08/15/14;  bladder suspension 07/30/21; laparoscopic lysis of adhesions 02/22/22; DMFibromyalgia; MVP; Seizures  BOWEL MOVEMENT: none   URINATION: Pain with urination: No Fully empty bladder: No, has to bend forward to finish her urination, needs a second urination Stream: Strong when relax, if in a hurry needs to relax to urinate Urgency: Yes:   Frequency: every 30 minutes Leakage: Walking to the bathroom, Coughing, Sneezing, and Laughing Pads: No  INTERCOURSE:not able to ask  PREGNANCY: Vaginal deliveries 1 Tearing Yes:   C-section deliveries 1     OBJECTIVE:   DIAGNOSTIC FINDINGS:  none   COGNITION: Overall cognitive status: Within functional limits for tasks assessed     SENSATION: Light touch: Appears intact Proprioception: Appears intact   POSTURE: decreased lumbar lordosis  PELVIC ALIGNMENT: Pelvis in correct alignment  LUMBARAROM/PROM:  A/PROM A/PROM  eval  Flexion Decreased by 25%  Extension Decreased by 50%  Right lateral flexion Decreased by 25%  Left lateral flexion Decreased by 25%  Right rotation Decreased by 25%  Left rotation Decreased by 25%   (Blank rows = not tested)  LOWER EXTREMITY ROM:  Passive ROM Right eval Left eval  Hip internal rotation 10 5   (Blank rows = not tested)  LOWER EXTREMITY MMT:  MMT Right eval Left eval  Hip extension 5/5 5/5  Hip abduction 4/5 4/5  Hip adduction 5/5 5/5   PALPATION:   General  Able to contract abdominals while pressing on her thighs. C-section scar is restricted; rib cage angle is larger than 90 degrees. Not able to move the lower rib cage.  External Perineal Exam intact                             Internal Pelvic Floor tightness in the introitus  Patient confirms identification and approves PT to assess internal pelvic floor and treatment Yes  PELVIC MMT:   MMT eval  Vaginal 2/5  (Blank rows = not tested)         TODAY'S TREATMENT:                                                                                                                               DATE: 02/22/23  EVAL See below   PATIENT EDUCATION:  Education details: Access Code: [email or web address] Person educated: Patient Education method: Explanation, Demonstration, Tactile cues, Verbal cues, and Handouts Education comprehension: verbalized understanding, returned demonstration, verbal cues required, tactile cues required, and needs further education  HOME EXERCISE PROGRAM: 02/22/23 Access Code: [email or web address] URL: [email or web address] Date: 02/22/2023 Prepared by: Earlie Counts  Exercises - Supine Pelvic Floor Contraction  - 3 x daily - 7 x weekly - 1 sets - 10 reps - 5 sec hold - Quick Flick Pelvic Floor Contractions in [place]  - 3 x daily - 7 x weekly - 1 sets - 5 reps - Supine Transversus Abdominis Bracing - Hands on Thighs  - 2 x daily - 7 x weekly - 1 sets - 10 reps - 5 sec hold  ASSESSMENT:  CLINICAL IMPRESSION: Patient is a 42 y.o. female who was seen today for physical therapy evaluation and treatment for urge incontinence. Patient is scheduled for mesh revision on 03/09/23 at [facility]. Patient will leak urine with walking to the bathroom, coughing, sneezing, and laughing. She wears no pads. She has to go to the bathroom every 30 minutes. She will urinate 2 times to completely empty her bladder. Pelvic floor strength is 2/5. She has decreased lumbar ROM and weakness in her hips. She is not able to contract her abdominals. Patient has restrictions of the C-section scar. Patient will benefit from skilled therapy to improve pelvic floor strength prior and after her surgery.   OBJECTIVE IMPAIRMENTS: decreased activity tolerance, decreased coordination, decreased endurance, decreased strength, and increased fascial restrictions.   ACTIVITY LIMITATIONS: continence, toileting, and locomotion level  PARTICIPATION LIMITATIONS: community activity and occupation  PERSONAL FACTORS: Age, Time  since onset of injury/illness/exacerbation, and 3+ comorbidities: Abdominal hysterectomy 08/15/14; bladder suspension 07/30/21; laparoscopic lysis of adhesions 02/22/22; DMFibromyalgia; MVP; Seizures  are also affecting patient's functional outcome.   REHAB POTENTIAL: Good  CLINICAL DECISION MAKING: Evolving/moderate complexity  EVALUATION COMPLEXITY: Moderate   GOALS: Goals reviewed with patient? Yes  SHORT TERM GOALS: Target date: 03/21/23  Patient is independent with abdominal and pelvic floor contraction.  Baseline: Goal status: INITIAL  2.  Patient educated on correct lifting to decrease strain on the pelvic floor.  Baseline:  Goal status: INITIAL  3.  Patient educated  on what she can do to assist with her recovery from her procedure.  Baseline:  Goal status: INITIAL  4.  Patient on timed voiding prior to her surgery.  Baseline:  Goal status: INITIAL  5.  Pelvic floor strength is 3/5 holding for 5 seconds.  Baseline:  Goal status: INITIAL   LONG TERM GOALS: Target date: 05/17/23  Independent with advanced HEP for pelvic floor and core to reduce urinary leakage. Baseline:  Goal status: INITIAL  2.  Pelvic floor strength >/= 3/5 holding for 10 seconds so she is able to walk to the bathroom without leaking.  Baseline:  Goal status: INITIAL  3.  Patient is able to perform her job activities including lifting, moving equipment around correctly with good body mechanics and core engagement to reduce stress on the pelvic floor.  Baseline:  Goal status: INITIAL  4.  Patient is able to laugh, cough, and sneeze without leaking urine due to increased strength of pelvic floor  Baseline:  Goal status: INITIAL   PLAN:  PT FREQUENCY: 1-2x/week  PT DURATION: 12 weeks, patient will stop therapy after 4/4 when having surgery and resume when MD gives her the clearance.   PLANNED INTERVENTIONS: Therapeutic exercises, Therapeutic activity, Neuromuscular re-education,  Patient/Family education, Joint mobilization, Dry Needling, Electrical stimulation, Spinal mobilization, Cryotherapy, Moist heat, scar mobilization, Biofeedback, and Manual therapy  PLAN FOR NEXT SESSION: abdominal contraction, manual work to pelvic floor to work on circular contraction, pelvic floor exercise.    Earlie Counts, PT 02/22/23 11:35 AM

## 2023-02-22 ENCOUNTER — Encounter: Payer: Self-pay | Admitting: Physical Therapy

## 2023-02-22 ENCOUNTER — Ambulatory Visit: Payer: 59 | Attending: Obstetrics and Gynecology | Admitting: Physical Therapy

## 2023-02-22 ENCOUNTER — Other Ambulatory Visit: Payer: Self-pay

## 2023-02-22 DIAGNOSIS — N3941 Urge incontinence: Secondary | ICD-10-CM | POA: Insufficient documentation

## 2023-02-22 DIAGNOSIS — M6281 Muscle weakness (generalized): Secondary | ICD-10-CM

## 2023-02-22 DIAGNOSIS — M7918 Myalgia, other site: Secondary | ICD-10-CM | POA: Insufficient documentation

## 2023-02-22 DIAGNOSIS — R278 Other lack of coordination: Secondary | ICD-10-CM

## 2023-02-24 ENCOUNTER — Ambulatory Visit: Payer: 59 | Admitting: Physical Therapy

## 2023-02-24 DIAGNOSIS — Z01818 Encounter for other preprocedural examination: Secondary | ICD-10-CM | POA: Diagnosis not present

## 2023-02-27 ENCOUNTER — Ambulatory Visit: Payer: 59 | Admitting: Physical Therapy

## 2023-02-27 ENCOUNTER — Encounter: Payer: Self-pay | Admitting: Physical Therapy

## 2023-02-27 DIAGNOSIS — M6281 Muscle weakness (generalized): Secondary | ICD-10-CM

## 2023-02-27 DIAGNOSIS — N3941 Urge incontinence: Secondary | ICD-10-CM | POA: Diagnosis not present

## 2023-02-27 DIAGNOSIS — R278 Other lack of coordination: Secondary | ICD-10-CM

## 2023-02-27 DIAGNOSIS — M7918 Myalgia, other site: Secondary | ICD-10-CM | POA: Diagnosis not present

## 2023-02-27 NOTE — Therapy (Addendum)
OUTPATIENT PHYSICAL THERAPY TREATMENT NOTE   Patient Name: Samantha [name] MRN: [id number] DOB:16-Sep-1981, 42 y.o., female Today's Date: 02/27/2023  PCP: None  REFERRING PROVIDER:  Truddie Hidden, MD   END OF SESSION:   PT End of Session - 02/27/23 1017     Visit Number 2    Date for PT Re-Evaluation 05/17/23    Authorization Type [employer] medicare    Authorization - Visit Number 2    Authorization - Number of Visits 10    PT Start Time [id number]    PT Stop Time 1055    PT Time Calculation (min) 40 min    Activity Tolerance Patient tolerated treatment well    Behavior During Therapy WFL for tasks assessed/performed             Past Medical History:  Diagnosis Date   Anemia    Angio-edema    Anxiety    Chest pain    a. 06/2020 MV: EF 55%, no ischemia or infarct.  No significant coronary calcium on attenuation correction CT.   Complication of anesthesia    slow to wake up 07/26/2021   COVID    sept 2021 flu like symptoms and cough.   lasted 5 days. 07/26/2021   Depression    rx presc but not taken   Diabetes mellitus without complication ([place])    Dilated aortic root ([place])    a. 05/2020 Echo: Ao root 72mm; b. 08/2021 Echo: Ao root 13mm, Asc Ao 77mm.   Fatty liver    Fibromyalgia    GERD (gastroesophageal reflux disease)    no meds   H/o Sinus infection    Headache(784.0)    tension and with anxiety hx. complicated migraine [id number]   Hepatic steatosis    History of kidney stones    MVP (mitral valve prolapse)    a. Pt told MVP present as child; b. 05/2020 Echo: EF 55-60%, no rwma, nl RV size/fxn, triv MR, mild AI. Ao root 32mm; c. 05/2021 Echo: EF 60-65%, no rwma, GrI DD, nl RV fxn, mildly dil LA, no MR, Ao root 40mm, Asc AO 54mm.   Ovarian cyst    Pneumonia    hx   Seizures ([place])    a. non epileptic events per epilepsy monitoring unit; b. last seizure July 2015- does not convulse but has a sleep effect. pt states she doesnt have seizures, she has complicated  migraines and causes her to have sleep like effective per pt. pt goes to [facility] and use to see Dr. Hillis Range but she left the practiced and doesnt have new dr. 07/27/2021   Sleep apnea    has cpap does not use   Wears glasses    07/26/2021   Past Surgical History:  Procedure Laterality Date   ABDOMINAL HYSTERECTOMY N/A 08/15/2014   Procedure: HYSTERECTOMY ABDOMINAL WITH CYSTOSCOPY;  Surgeon: Sanjuana Kava, MD;  Location: [facility];  Service: Gynecology;  Laterality: N/A;   BLADDER SUSPENSION N/A 07/30/2021   Procedure: TRANSVAGINAL TAPE (TVT) PROCEDURE;  Surgeon: Sanjuana Kava, MD;  Location: [facility];  Service: Gynecology;  Laterality: N/A;   CESAREAN SECTION N/A    2010 07/26/2021   CYSTOSCOPY N/A 07/30/2021   Procedure: CYSTOSCOPY;  Surgeon: Sanjuana Kava, MD;  Location: [facility];  Service: Gynecology;  Laterality: N/A;   CYSTOSCOPY/RETROGRADE/URETEROSCOPY Left 05/23/2019   Procedure: CYSTOSCOPY/RETROGRADE/URETEROSCOPY;  Surgeon: Abbie Sons, MD;  Location: [facility];  Service: Urology;  Laterality: Left;   KNEE  ARTHROSCOPY     yr 2020 07/26/2021   LAPAROSCOPIC LYSIS OF ADHESIONS N/A 02/22/2022   Procedure: LAPAROSCOPIC LYSIS OF ADHESIONS;  Surgeon: Sanjuana Kava, MD;  Location: [facility];  Service: Gynecology;  Laterality: N/A;   LAPAROSCOPY Left 02/22/2022   Procedure: LAPAROSCOPY DIAGNOSTIC and Pelvic Washings;  Surgeon: Sanjuana Kava, MD;  Location: [facility];  Service: Gynecology;  Laterality: Left;   SHOULDER ARTHROSCOPY WITH SUBACROMIAL DECOMPRESSION AND OPEN ROTATOR C Left 11/27/2015   Procedure: LEFT SHOULDER ARTHROSCOPY WITH SUBACROMIAL DECOMPRESSION, MINI OPEN ROTATOR CUFF REPAIR;  Surgeon: Netta Cedars, MD;  Location: [facility];  Service: Orthopedics;  Laterality: Left;   TUBAL LIGATION     Patient Active Problem List   Diagnosis Date Noted   Witnessed episode of apnea 10/05/2022   Sleep choking syndrome 10/05/2022   Excessive daytime sleepiness  10/05/2022   Non-restorative sleep 10/05/2022   Retrognathia 10/05/2022   Class 1 obesity due to excess calories with body mass index (BMI) of 32.0 to 32.9 in adult 10/05/2022   Upper respiratory tract infection due to COVID-19 virus 12/06/2021   Acute otitis media 12/06/2021   Chest pain 04/08/2020   SOB (shortness of breath) 04/08/2020   Palpitations 04/08/2020   Essential hypertension 06/12/2019   Mood disorder ([place]) 06/12/2019   Ureteral calculus [id number]   Renal colic [id number]   Hydronephrosis with urinary obstruction due to ureteral calculus 05/20/2019   Irritability and anger 03/06/2019   Heartburn 03/04/2019   Chronic rhinitis 03/04/2019   Adverse food reaction 03/04/2019   Globus sensation 02/28/2019   Finger pain, left 01/01/2019   Allergy history unknown- possible throat closure sensation to certain foods [id number]   Neuropathic pain 09/04/2018   Chronic radicular pain of lower back 09/04/2018   Lumbar foraminal stenosis-L5-S1 09/04/2018   Osteoarthritis of spine with radiculopathy, lumbar region 09/04/2018   Obesity, Class I, BMI 30-34.9 09/04/2018   Piriformis syndrome, left 07/12/2018   Single episode of elevated blood pressure 07/12/2018   Hypertriglyceridemia 04/11/2018   Low level of high density lipoprotein (HDL) 04/11/2018   Vitamin D insufficiency 04/11/2018   Epigastric abdominal pain with N 04/06/2018   History of vitamin D deficiency 04/06/2018   Spondylosis without myelopathy or radiculopathy, lumbar region [id number]   Acute folliculitis- likely due to MRSA, they are draining 06/19/2017   Personal history of MRSA (methicillin resistant Staphylococcus aureus) 06/19/2017   Chronic diarrhea  06/19/2017   Contact dermatitis and eczema- due to tape\ Band-Aid 06/19/2017   Abdominal pain, LUQ 05/02/2017   Abdominal pain, LLQ 05/02/2017   Hx of diarrhea 05/02/2017   Environmental and seasonal allergies 09/29/2016   GERD (gastroesophageal reflux disease)  09/29/2016   Bipolar 1 disorder - Anxiety 09/28/2016   Fibromyalgia 08/11/2016   Type 2 diabetes mellitus with complication, without long-term current use of insulin ([place]) 12/29/2015   OSA on CPAP 12/08/2014   Obstructive sleep apnea syndrome, moderate 11/24/2014   Fatty liver 09/05/2014   S/P abdominal hysterectomy 08/15/2014   Bladder dysfunction 04/30/2014   Nephrolithiasis 03/04/2014   Agenesis, corpus callosum ([place]) 02/17/2014   GAD (generalized anxiety disorder) 02/17/2014   History of blood transfusion 02/17/2014   Obesity (BMI 30-39.9) 02/17/2014   REFERRING DIAG:  N39.41 (ICD-10-CM) - Urge incontinence  M79.18 (ICD-10-CM) - Myalgia, other site  M62.89 (ICD-10-CM) - Other specified disorders of muscle      THERAPY DIAG:  Muscle weakness (generalized) - Plan: PT plan of care cert/re-cert   Other lack of coordination - Plan: PT plan  of care cert/re-cert   Rationale for Evaluation and Treatment: Rehabilitation   ONSET DATE: 2022   SUBJECTIVE:                                                                                                                                                                                            SUBJECTIVE STATEMENT: Patient reports her urinary leakage has become worse in the past 1.5 years. She had a bladder tack then came loose and the leakage increased. Several times did not make it to the bathroom. Patient is scheduled for a mesh revision in [place] on 03/09/23.  Fluid intake: Yes: water and sweet tea     PAIN:  Are you having pain? No pelvic pain   PRECAUTIONS: None   WEIGHT BEARING RESTRICTIONS: No   FALLS:  Has patient fallen in last 6 months? No   LIVING ENVIRONMENT: Lives with: lives with their family   OCCUPATION: Mechanic on Oncologist, standing, lifting   PLOF: Independent   PATIENT GOALS: reduce leakage and amount of time to come to the bathroom.    PERTINENT HISTORY:  Abdominal hysterectomy 08/15/14;  bladder suspension 07/30/21; laparoscopic lysis of adhesions 02/22/22; DMFibromyalgia; MVP; Seizures   BOWEL MOVEMENT: none     URINATION: Pain with urination: No Fully empty bladder: No, has to bend forward to finish her urination, needs a second urination Stream: Strong when relax, if in a hurry needs to relax to urinate Urgency: Yes:   Frequency: every 30 minutes Leakage: Walking to the bathroom, Coughing, Sneezing, and Laughing Pads: No   INTERCOURSE:not able to ask   PREGNANCY: Vaginal deliveries 1 Tearing Yes:   C-section deliveries 1         OBJECTIVE:    DIAGNOSTIC FINDINGS:  none     COGNITION: Overall cognitive status: Within functional limits for tasks assessed                          SENSATION: Light touch: Appears intact Proprioception: Appears intact     POSTURE: decreased lumbar lordosis   PELVIC ALIGNMENT: Pelvis in correct alignment   LUMBARAROM/PROM:   A/PROM A/PROM  eval  Flexion Decreased by 25%  Extension Decreased by 50%  Right lateral flexion Decreased by 25%  Left lateral flexion Decreased by 25%  Right rotation Decreased by 25%  Left rotation Decreased by 25%   (Blank rows = not tested)   LOWER EXTREMITY ROM:   Passive ROM Right eval Left eval  Hip internal rotation 10 5   (Blank rows = not tested)   LOWER EXTREMITY MMT:  MMT Right eval Left eval  Hip extension 5/5 5/5  Hip abduction 4/5 4/5  Hip adduction 5/5 5/5    PALPATION:   General  Able to contract abdominals while pressing on her thighs. C-section scar is restricted; rib cage angle is larger than 90 degrees. Not able to move the lower rib cage.                  External Perineal Exam intact                             Internal Pelvic Floor tightness in the introitus   Patient confirms identification and approves PT to assess internal pelvic floor and treatment Yes   PELVIC MMT:   MMT eval  Vaginal 2/5  (Blank rows = not tested)           TODAY'S  TREATMENT:  02/27/23 Neuromuscular re-education: Core facilitation: Supine with ball between knees to squeeze as she contracts the lower abdominals with tactile cues Sidely transverse abdominus with pressing top hand into ball.  Supine press ball into the thigh holding 5 sec 10 x each side Pelvic floor contraction training: Sitting on yellow band and contract the pelvic floor holding 5 sec 10x Down training: Diaphragmatic breathing with legs up and using red band to open up the lower rib cage Exercises: Strengthening: Squatting with keeping the distance between  pubic bone and rib cage Therapeutic activities: Functional strengthening activities: Educated patient on correct lifting technique to reduce strain on pelvic floor, not holding her breath, keeping the distance between the pubic bone and rib cage Educated patient on how to assist with full urination by moving the hips then sit to stand for the second urination                                                                                                                          PATIENT EDUCATION:  02/27/23 Education details: Access Code: [email or web address], Correct lifting, ways to recover after surgery Person educated: Patient Education method: Explanation, Demonstration, Tactile cues, Verbal cues, and Handouts Education comprehension: verbalized understanding, returned demonstration, verbal cues required, tactile cues required, and needs further education   HOME EXERCISE PROGRAM: 02/27/23 Access Code: [email or web address] URL: [email or web address] Date: 02/27/2023 Prepared by: Earlie Counts  Program Notes after urinating the first time then move hips side to side and back and forth, stand up and sit up to get the second pee  Exercises - Supine Pelvic Floor Contraction  - 3 x daily - 7 x weekly - 1 sets - 10 reps - 5 sec hold - Quick Flick Pelvic Floor Contractions in Hooklying  - 3 x daily - 7 x weekly - 1 sets - 5 reps -  Supine Transversus Abdominis Bracing - Hands on Thighs  - 2 x daily - 7 x weekly - 1 sets - 10 reps - 5 sec hold - Supine Transversus Abdominis Bracing -  Hands on Ground  - 1 x daily - 7 x weekly - 1 sets - 10 reps - Supine Bridge with Mini Swiss Ball Between Knees  - 1 x daily - 7 x weekly - 1 sets - 10 reps - Sidelying Transversus Abdominis Bracing  - 1 x daily - 7 x weekly - 1 sets - 10 reps - Diaphragmatic Breathing at 90/90 Supported  - 3 x daily - 7 x weekly - 1 sets - 10 reps - Squat with Chair Touch  - 1 x daily - 7 x weekly - 1 sets - 10 reps    ASSESSMENT:   CLINICAL IMPRESSION: Patient is a 42 y.o. female who was seen today for physical therapy  treatment for urge incontinence. Patient has learned ways to fully urinate and lift correctly. She is able to feel the pelvic floor contraction when sitting on the little ball. Patient is able to contract the lower abdominals and feel it contract 50% of the time. Patient is able to perform diaphragmatic breathing. Patient will benefit from skilled therapy to improve pelvic floor strength prior and after her surgery.    OBJECTIVE IMPAIRMENTS: decreased activity tolerance, decreased coordination, decreased endurance, decreased strength, and increased fascial restrictions.    ACTIVITY LIMITATIONS: continence, toileting, and locomotion level   PARTICIPATION LIMITATIONS: community activity and occupation   PERSONAL FACTORS: Age, Time since onset of injury/illness/exacerbation, and 3+ comorbidities: Abdominal hysterectomy 08/15/14; bladder suspension 07/30/21; laparoscopic lysis of adhesions 02/22/22; DMFibromyalgia; MVP; Seizures  are also affecting patient's functional outcome.    REHAB POTENTIAL: Good   CLINICAL DECISION MAKING: Evolving/moderate complexity   EVALUATION COMPLEXITY: Moderate     GOALS: Goals reviewed with patient? Yes   SHORT TERM GOALS: Target date: 03/21/23   Patient is independent with abdominal and pelvic floor  contraction.  Baseline: Goal status: INITIAL   2.  Patient educated on correct lifting to decrease strain on the pelvic floor.  Baseline:  Goal status: INITIAL   3.  Patient educated on what she can do to assist with her recovery from her procedure.  Baseline:  Goal status: INITIAL   4.  Patient on timed voiding prior to her surgery.  Baseline:  Goal status: INITIAL   5.  Pelvic floor strength is 3/5 holding for 5 seconds.  Baseline:  Goal status: INITIAL     LONG TERM GOALS: Target date: 05/17/23   Independent with advanced HEP for pelvic floor and core to reduce urinary leakage. Baseline:  Goal status: INITIAL   2.  Pelvic floor strength >/= 3/5 holding for 10 seconds so she is able to walk to the bathroom without leaking.  Baseline:  Goal status: INITIAL   3.  Patient is able to perform her job activities including lifting, moving equipment around correctly with good body mechanics and core engagement to reduce stress on the pelvic floor.  Baseline:  Goal status: INITIAL   4.  Patient is able to laugh, cough, and sneeze without leaking urine due to increased strength of pelvic floor  Baseline:  Goal status: INITIAL     PLAN:   PT FREQUENCY: 1-2x/week   PT DURATION: 12 weeks, patient will stop therapy after 4/4 when having surgery and resume when MD gives her the clearance.    PLANNED INTERVENTIONS: Therapeutic exercises, Therapeutic activity, Neuromuscular re-education, Patient/Family education, Joint mobilization, Dry Needling, Electrical stimulation, Spinal mobilization, Cryotherapy, Moist heat, scar mobilization, Biofeedback, and Manual therapy   PLAN FOR NEXT SESSION:  manual work to pelvic  floor to work on circular contraction, manual work to c-section scar, hip ROM     Eulis Foster, PT 02/27/23 10:58 AM   PHYSICAL THERAPY DISCHARGE SUMMARY  Visits from Start of Care: 2  Current functional level related to goals / functional outcomes: See above.  Patient has not returned since 02/27/23.    Remaining deficits: See above.    Education / Equipment: HEP   Patient agrees to discharge. Patient goals were not met. Patient is being discharged due to not returning since the last visit. Thank you for the referral. Eulis Foster, PT 05/16/23 8:16 AM

## 2023-03-01 ENCOUNTER — Encounter: Payer: Self-pay | Admitting: Family Medicine

## 2023-03-01 ENCOUNTER — Ambulatory Visit: Payer: 59 | Admitting: Physical Therapy

## 2023-03-01 ENCOUNTER — Ambulatory Visit (INDEPENDENT_AMBULATORY_CARE_PROVIDER_SITE_OTHER): Payer: 59 | Admitting: Family Medicine

## 2023-03-01 ENCOUNTER — Telehealth: Payer: Self-pay | Admitting: Internal Medicine

## 2023-03-01 VITALS — BP 118/70 | HR 84 | Ht 68.0 in | Wt 204.0 lb

## 2023-03-01 DIAGNOSIS — S060X0A Concussion without loss of consciousness, initial encounter: Secondary | ICD-10-CM

## 2023-03-01 HISTORY — DX: Concussion without loss of consciousness, initial encounter: S06.0X0A
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
## 2023-03-01 NOTE — Telephone Encounter (Signed)
Carloyn Jaeger is calling from [facility] due to needing the tracing of the patient's EKG from August of 2022. She reports she faxed a record release request to the main [place] office fax number and received a fax back from HIM stating the patient was never seen by our practice. She is requesting the EKG tracing be sent to [phone]. Please advise.

## 2023-03-01 NOTE — Assessment & Plan Note (Signed)
Suffered concussion 3 days ago. Having continued symptoms. Not on blood thinners.  Normal neurologic exam.  Recommended conservative mgmt.  Discussed reasons to go to the ED including focal neurologic deficits to be aware of.  Advised to come back if symptoms not resolved in 4 weeks.  Would refer to neurology if not resolved at that time.

## 2023-03-01 NOTE — Telephone Encounter (Signed)
EKG faxed to number requested.

## 2023-03-01 NOTE — Patient Instructions (Signed)
You can use tylenol or ibuprofen for pain,   If you develop any focal weakness or numbness or if you develop difficulty speaking or understanding words go to the emergency department for further evaluation of your head injury.    If symptoms do not resolve in the next 4 weeks please let us know.    Have a great day,   Dr. Jeannine Kitten

## 2023-03-04 ENCOUNTER — Other Ambulatory Visit: Payer: Self-pay | Admitting: Nurse Practitioner

## 2023-03-04 DIAGNOSIS — R454 Irritability and anger: Secondary | ICD-10-CM

## 2023-03-04 DIAGNOSIS — F411 Generalized anxiety disorder: Secondary | ICD-10-CM

## 2023-03-06 ENCOUNTER — Telehealth: Payer: Self-pay | Admitting: Physical Therapy

## 2023-03-06 ENCOUNTER — Ambulatory Visit: Payer: 59 | Attending: Obstetrics and Gynecology | Admitting: Physical Therapy

## 2023-03-06 DIAGNOSIS — N3941 Urge incontinence: Secondary | ICD-10-CM | POA: Insufficient documentation

## 2023-03-06 DIAGNOSIS — M7918 Myalgia, other site: Secondary | ICD-10-CM | POA: Insufficient documentation

## 2023-03-06 NOTE — Telephone Encounter (Signed)
Spoke to patient about her 11:00 appointment. She was stuck in traffic and not able to make it in time. She will be placed on the waitlist.  Earlie Counts, PT @4 /1/24@ 11:30 AM

## 2023-03-07 ENCOUNTER — Telehealth: Payer: Self-pay

## 2023-03-07 DIAGNOSIS — S060X0D Concussion without loss of consciousness, subsequent encounter: Secondary | ICD-10-CM

## 2023-03-07 NOTE — Telephone Encounter (Signed)
Patient had OV with [facility] 10/05/22 and should be able to contact the office to schedule a follow up visit. LVM requesting a return call.

## 2023-03-07 NOTE — Addendum Note (Signed)
Addended by: Virgil Benedict on: 03/07/2023 04:40 PM   Modules accepted: Orders

## 2023-03-07 NOTE — Telephone Encounter (Signed)
Referral has been sent to [facility] 

## 2023-03-07 NOTE — Telephone Encounter (Signed)
Pt is in need of a new referral for the concussion.

## 2023-03-07 NOTE — Telephone Encounter (Signed)
Pt was suppose to have a procedure this week but due to the concussion documented by Dr. Jeannine Kitten Anesthesia is wanting her cleared by Neurology.  Pt has been seen at [facility] in the past.

## 2023-03-20 ENCOUNTER — Encounter: Payer: Self-pay | Admitting: Family Medicine

## 2023-03-20 ENCOUNTER — Ambulatory Visit (INDEPENDENT_AMBULATORY_CARE_PROVIDER_SITE_OTHER): Payer: 59 | Admitting: Family Medicine

## 2023-03-20 VITALS — BP 124/84 | HR 90 | Resp 18 | Ht 68.0 in | Wt 208.0 lb

## 2023-03-20 DIAGNOSIS — I10 Essential (primary) hypertension: Secondary | ICD-10-CM

## 2023-03-20 DIAGNOSIS — E559 Vitamin D deficiency, unspecified: Secondary | ICD-10-CM

## 2023-03-20 DIAGNOSIS — B351 Tinea unguium: Secondary | ICD-10-CM | POA: Diagnosis not present

## 2023-03-20 DIAGNOSIS — Z1159 Encounter for screening for other viral diseases: Secondary | ICD-10-CM

## 2023-03-20 DIAGNOSIS — E118 Type 2 diabetes mellitus with unspecified complications: Secondary | ICD-10-CM | POA: Diagnosis not present

## 2023-03-20 DIAGNOSIS — E781 Pure hyperglyceridemia: Secondary | ICD-10-CM | POA: Diagnosis not present

## 2023-03-20 DIAGNOSIS — G43119 Migraine with aura, intractable, without status migrainosus: Secondary | ICD-10-CM | POA: Diagnosis not present

## 2023-03-20 LAB — POCT UA - MICROALBUMIN
Albumin/Creatinine Ratio, Urine, POC: <30
Creatinine, POC: 100 mg/dL
Microalbumin Ur, POC: 30 mg/L

## 2023-03-20 MED ORDER — DAPAGLIFLOZIN PROPANEDIOL 5 MG PO TABS
5.0000 mg | ORAL_TABLET | Freq: Every day | ORAL | 2 refills | Status: DC
Start: 2023-03-20 — End: 2023-05-23

## 2023-03-20 MED ORDER — TERBINAFINE HCL 250 MG PO TABS
250.0000 mg | ORAL_TABLET | Freq: Every day | ORAL | 0 refills | Status: AC
Start: 2023-03-20 — End: 2023-05-31

## 2023-03-20 MED ORDER — NURTEC 75 MG PO TBDP: 75.0000 mg | ORAL_TABLET | ORAL | 2 refills | Status: DC

## 2023-03-20 NOTE — Patient Instructions (Signed)
Start taking the terbinafine for the fungal infection in your toe.  You will take this for 12 weeks total.  Start taking the dapagliflozin and Marcelline Deist) for diabetes.

## 2023-03-20 NOTE — Assessment & Plan Note (Signed)
Repeating lipid panel today.  May add statin medication or something like fenofibrate depending on lab results.  Will continue to monitor.
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
## 2023-03-20 NOTE — Assessment & Plan Note (Addendum)
Rechecking A1c today, at last check was 7.4 in 2021.  Patient has tried and failed metformin and Victoza due to GI side effects.  Starting dapagliflozin 5 mg daily, will recheck A1c at follow-up appointment in 3 months.  Referral made to ophthalmology for diabetic eye exam.  Foot exam and urine microalbumin completed today.  Encourage patient to continue checking blood sugars at home and making efforts through diet and lifestyle.

## 2023-03-20 NOTE — Assessment & Plan Note (Signed)
Blood pressure goal of less than 130/80, essentially at goal in office today.  Encouraged to continue limiting salt and getting exercise is much as she can.  Continue metoprolol 25 mg daily.  Continue following with cardiology.

## 2023-03-21 ENCOUNTER — Other Ambulatory Visit: Payer: Self-pay | Admitting: Family Medicine

## 2023-03-21 DIAGNOSIS — E559 Vitamin D deficiency, unspecified: Secondary | ICD-10-CM

## 2023-03-21 DIAGNOSIS — E781 Pure hyperglyceridemia: Secondary | ICD-10-CM

## 2023-03-21 LAB — LIPID PANEL
Chol/HDL Ratio: 4.3 ratio (ref 0.0–4.4)
Cholesterol, Total: 150 mg/dL (ref 100–199)
HDL: 35 mg/dL — ABNORMAL LOW (ref >39)
LDL Chol Calc (NIH): 35 mg/dL (ref 0–99)
Triglycerides: 579 mg/dL (ref 0–149)
VLDL Cholesterol Cal: 80 mg/dL — ABNORMAL HIGH (ref 5–40)

## 2023-03-21 LAB — COMPREHENSIVE METABOLIC PANEL
ALT: 18 IU/L (ref 0–32)
AST: 13 IU/L (ref 0–40)
Albumin/Globulin Ratio: 1.5 (ref 1.2–2.2)
Albumin: 4 g/dL (ref 3.9–4.9)
Alkaline Phosphatase: 104 IU/L (ref 44–121)
BUN/Creatinine Ratio: 17 (ref 9–23)
BUN: 11 mg/dL (ref 6–24)
Bilirubin Total: <0.2 mg/dL (ref 0.0–1.2)
CO2: 22 mmol/L (ref 20–29)
Calcium: 9.4 mg/dL (ref 8.7–10.2)
Chloride: 98 mmol/L (ref 96–106)
Creatinine, Ser: 0.65 mg/dL (ref 0.57–1.00)
Globulin, Total: 2.6 g/dL (ref 1.5–4.5)
Glucose: 264 mg/dL — ABNORMAL HIGH (ref 70–99)
Potassium: 4.1 mmol/L (ref 3.5–5.2)
Sodium: 138 mmol/L (ref 134–144)
Total Protein: 6.6 g/dL (ref 6.0–8.5)
eGFR: 112 mL/min/{1.73_m2} (ref >59)

## 2023-03-21 LAB — CBC WITH DIFFERENTIAL/PLATELET
Basophils Absolute: 0.1 10*3/uL (ref 0.0–0.2)
Basos: 1 %
EOS (ABSOLUTE): 0.1 10*3/uL (ref 0.0–0.4)
Eos: 2 %
Hematocrit: 42 % (ref 34.0–46.6)
Hemoglobin: 13.4 g/dL (ref 11.1–15.9)
Immature Grans (Abs): 0 10*3/uL (ref 0.0–0.1)
Immature Granulocytes: 0 %
Lymphocytes Absolute: 2.3 10*3/uL (ref 0.7–3.1)
Lymphs: 33 %
MCH: 27.9 pg (ref 26.6–33.0)
MCHC: 31.9 g/dL (ref 31.5–35.7)
MCV: 87 fL (ref 79–97)
Monocytes Absolute: 0.4 10*3/uL (ref 0.1–0.9)
Monocytes: 6 %
Neutrophils Absolute: 4 10*3/uL (ref 1.4–7.0)
Neutrophils: 58 %
Platelets: 191 10*3/uL (ref 150–450)
RBC: 4.81 x10E6/uL (ref 3.77–5.28)
RDW: 13.1 % (ref 11.7–15.4)
WBC: 7 10*3/uL (ref 3.4–10.8)

## 2023-03-21 LAB — HEMOGLOBIN A1C
Est. average glucose Bld gHb Est-mCnc: 206 mg/dL
Hgb A1c MFr Bld: 8.8 % — ABNORMAL HIGH (ref 4.8–5.6)

## 2023-03-21 LAB — HEPATITIS C ANTIBODY: Hep C Virus Ab: NONREACTIVE

## 2023-03-21 LAB — VITAMIN D 25 HYDROXY (VIT D DEFICIENCY, FRACTURES): Vit D, 25-Hydroxy: 15.9 ng/mL — ABNORMAL LOW (ref 30.0–100.0)

## 2023-03-21 LAB — HIV ANTIBODY (ROUTINE TESTING W REFLEX): HIV Screen 4th Generation wRfx: NONREACTIVE

## 2023-03-21 MED ORDER — VITAMIN D (ERGOCALCIFEROL) 1.25 MG (50000 UNIT) PO CAPS
50000.0000 [IU] | ORAL_CAPSULE | ORAL | 0 refills | Status: DC
Start: 2023-03-21 — End: 2023-09-20

## 2023-03-21 MED ORDER — FENOFIBRATE 54 MG PO TABS
54.0000 mg | ORAL_TABLET | Freq: Every day | ORAL | 2 refills | Status: DC
Start: 2023-03-21 — End: 2023-09-20

## 2023-03-23 ENCOUNTER — Ambulatory Visit: Payer: 59 | Admitting: Physical Therapy

## 2023-03-29 ENCOUNTER — Encounter: Payer: Self-pay | Admitting: Podiatry

## 2023-03-29 ENCOUNTER — Ambulatory Visit (INDEPENDENT_AMBULATORY_CARE_PROVIDER_SITE_OTHER): Payer: 59 | Admitting: Podiatry

## 2023-03-29 DIAGNOSIS — L600 Ingrowing nail: Secondary | ICD-10-CM | POA: Diagnosis not present

## 2023-03-30 NOTE — Progress Notes (Signed)
Subjective:   Patient ID: Samantha [name], female   DOB: 42 y.o.   MRN: [id number]   HPI Patient presents with severely damaged big toenail left that is dystrophic discolored and thick.  She has had it removed as far as corners in the past and the rest of the nail has been a problem for her.  Patient does not smoke tries to be active   Review of Systems  All other systems reviewed and are negative.       Objective:  Physical Exam Vitals and nursing note reviewed.  Constitutional:      Appearance: She is well-developed.  Pulmonary:     Effort: Pulmonary effort is normal.  Musculoskeletal:        General: Normal range of motion.  Skin:    General: Skin is warm.  Neurological:     Mental Status: She is alert.     Neurovascular status intact muscle strength adequate range of motion adequate with a thickened dystrophic big toenail left foot painful when pressed makes shoe gear difficult and is red with mild irritation noted no drainage.  Good digital perfusion well-oriented     Assessment:  Damaged left big toenail painful when pressed     Plan:  H&P reviewed condition recommended removal of the nail explained procedure allowed patient to read consent form going over correction and she signed understanding risk.  Infiltrated the left big toe 60 mg Xylocaine Marcaine mixture sterile prep done and using sterile instrumentation removed the big toenail exposed matrix applied phenol 5 applications 30 seconds followed by alcohol lavage sterile dressing gave instructions on soaks and to wear dressing 24 hours take it off earlier if throbbing were to occur and encouraged her to call with questions concerns which may arise

## 2023-04-05 ENCOUNTER — Encounter: Payer: Self-pay | Admitting: Neurology

## 2023-04-05 ENCOUNTER — Ambulatory Visit: Payer: 59 | Admitting: Neurology

## 2023-04-10 DIAGNOSIS — E119 Type 2 diabetes mellitus without complications: Secondary | ICD-10-CM | POA: Diagnosis not present

## 2023-04-10 LAB — HM DIABETES EYE EXAM

## 2023-04-13 ENCOUNTER — Ambulatory Visit (INDEPENDENT_AMBULATORY_CARE_PROVIDER_SITE_OTHER): Payer: 59

## 2023-04-13 VITALS — Ht 68.0 in | Wt 203.0 lb

## 2023-04-13 DIAGNOSIS — Z0000 Encounter for general adult medical examination without abnormal findings: Secondary | ICD-10-CM | POA: Diagnosis not present

## 2023-04-13 NOTE — Progress Notes (Signed)
Subjective:   Samantha [name] is a 42 y.o. female who presents for Medicare Annual (Subsequent) preventive examination.  Review of Systems    Virtual Visit via Telephone Note  I connected with  Samantha [name] on 04/13/23 at 10:30 AM EDT by telephone and verified that I am speaking with the correct person using two identifiers.  Location: Patient: Home Provider: Office Persons participating in the virtual visit: patient/Nurse Health Advisor   I discussed the limitations, risks, security and privacy concerns of performing an evaluation and management service by telephone and the availability of in person appointments. The patient expressed understanding and agreed to proceed.  Interactive audio and video telecommunications were attempted between this nurse and patient, however failed, due to patient having technical difficulties OR patient did not have access to video capability.  We continued and completed visit with audio only.  Some vital signs may be absent or patient reported.   Tillie Rung, LPN  Cardiac Risk Factors include: advanced age (>33men, >1 women);diabetes mellitus;hypertension     Objective:    Today's Vitals   04/13/23 1021 04/13/23 1025  Weight: 203 lb (92.1 kg)   Height: 5\' 8"  (1.727 m)   PainSc:  0-No pain   Body mass index is 30.87 kg/m.     04/13/2023   10:32 AM 02/22/2023    8:15 AM 01/20/2023    7:51 PM 04/26/2022    2:23 PM 02/22/2022   12:21 PM 07/30/2021    7:26 AM 03/10/2021   12:42 PM  Advanced Directives  Does Patient Have a Medical Advance Directive? No No No No No No No  Would patient like information on creating a medical advance directive? No - Patient declined No - Patient declined  Yes (MAU/Ambulatory/Procedural Areas - Information given) No - Patient declined No - Patient declined     Current Medications (verified) Outpatient Encounter Medications as of 04/13/2023  Medication Sig   cetirizine (ZYRTEC) 10 MG tablet Take 10 mg by mouth  daily.   dapagliflozin propanediol (FARXIGA) 5 MG TABS tablet Take 1 tablet (5 mg total) by mouth daily.   fenofibrate 54 MG tablet Take 1 tablet (54 mg total) by mouth daily.   FLUoxetine (PROZAC) 20 MG capsule TAKE 1 CAPSULE BY MOUTH DAILY.   gabapentin (NEURONTIN) 300 MG capsule TAKE 1 CAPSULE BY MOUTH EVERY DAY AT BEDTIME.   metoprolol succinate (TOPROL-XL) 25 MG 24 hr tablet TAKE 1/2 TABLET BY MOUTH DAILY. PATIENT MUST SCHEDULE APPOINTMENT FOR FUTURE REFILLS FIRST ATTEMPT.   ondansetron (ZOFRAN) 4 MG tablet Take 1 tablet (4 mg total) by mouth every 8 (eight) hours as needed for nausea or vomiting.   Rimegepant Sulfate (NURTEC) 75 MG TBDP Take 1 tablet (75 mg total) by mouth every other day.   terbinafine (LAMISIL) 250 MG tablet Take 1 tablet (250 mg total) by mouth daily.   valACYclovir (VALTREX) 500 MG tablet Take 500 mg by mouth daily as needed (cold sores).    Vitamin D, Ergocalciferol, (DRISDOL) 1.25 MG (50000 UNIT) CAPS capsule Take 1 capsule (50,000 Units total) by mouth every 7 (seven) days.   No facility-administered encounter medications on file as of 04/13/2023.    Allergies (verified) Sulfa antibiotics, Topiramate, Duloxetine hcl, Ibuprofen, Metformin and related, and Penicillins   History: Past Medical History:  Diagnosis Date   Anemia    Angio-edema    Anxiety    Chest pain    a. 06/2020 MV: EF 55%, no ischemia or infarct.  No  significant coronary calcium on attenuation correction CT.   Complication of anesthesia    slow to wake up 07/26/2021   COVID    sept 2021 flu like symptoms and cough.   lasted 5 days. 07/26/2021   Depression    rx presc but not taken   Diabetes mellitus without complication (HCC)    Dilated aortic root (HCC)    a. 05/2020 Echo: Ao root 41mm; b. 08/2021 Echo: Ao root 42mm, Asc Ao 40mm.   Fatty liver    Fibromyalgia    GERD (gastroesophageal reflux disease)    no meds   H/o Sinus infection    Headache(784.0)    tension and with anxiety hx.  complicated migraine 07/26/2021   Hepatic steatosis    History of kidney stones    MVP (mitral valve prolapse)    a. Pt told MVP present as child; b. 05/2020 Echo: EF 55-60%, no rwma, nl RV size/fxn, triv MR, mild AI. Ao root 41mm; c. 05/2021 Echo: EF 60-65%, no rwma, GrI DD, nl RV fxn, mildly dil LA, no MR, Ao root 42mm, Asc AO 40mm.   Ovarian cyst    Pneumonia    hx   Seizures (HCC)    a. non epileptic events per epilepsy monitoring unit; b. last seizure July 2015- does not convulse but has a sleep effect. pt states she doesnt have seizures, she has complicated migraines and causes her to have sleep like effective per pt. pt goes to [facility] and use to see Dr. Michelle Nasuti but she left the practiced and doesnt have new dr. 07/27/2021   Sleep apnea    has cpap does not use   Wears glasses    07/26/2021   Past Surgical History:  Procedure Laterality Date   ABDOMINAL HYSTERECTOMY N/A 08/15/2014   Procedure: HYSTERECTOMY ABDOMINAL WITH CYSTOSCOPY;  Surgeon: Essie Hart, MD;  Location: [facility];  Service: Gynecology;  Laterality: N/A;   BLADDER SUSPENSION N/A 07/30/2021   Procedure: TRANSVAGINAL TAPE (TVT) PROCEDURE;  Surgeon: Essie Hart, MD;  Location: [facility] [place];  Service: Gynecology;  Laterality: N/A;   CESAREAN SECTION N/A    2010 07/26/2021   CYSTOSCOPY N/A 07/30/2021   Procedure: CYSTOSCOPY;  Surgeon: Essie Hart, MD;  Location: [facility] ;  Service: Gynecology;  Laterality: N/A;   CYSTOSCOPY/RETROGRADE/URETEROSCOPY Left 05/23/2019   Procedure: CYSTOSCOPY/RETROGRADE/URETEROSCOPY;  Surgeon: Riki Altes, MD;  Location: [facility];  Service: Urology;  Laterality: Left;   KNEE ARTHROSCOPY     yr 2020 07/26/2021   LAPAROSCOPIC LYSIS OF ADHESIONS N/A 02/22/2022   Procedure: LAPAROSCOPIC LYSIS OF ADHESIONS;  Surgeon: Essie Hart, MD;  Location: [facility];  Service: Gynecology;  Laterality: N/A;   LAPAROSCOPY Left 02/22/2022   Procedure: LAPAROSCOPY  DIAGNOSTIC and Pelvic Washings;  Surgeon: Essie Hart, MD;  Location: [facility];  Service: Gynecology;  Laterality: Left;   SHOULDER ARTHROSCOPY WITH SUBACROMIAL DECOMPRESSION AND OPEN ROTATOR C Left 11/27/2015   Procedure: LEFT SHOULDER ARTHROSCOPY WITH SUBACROMIAL DECOMPRESSION, MINI OPEN ROTATOR CUFF REPAIR;  Surgeon: Beverely Low, MD;  Location: [facility];  Service: Orthopedics;  Laterality: Left;   TUBAL LIGATION     Family History  Problem Relation Age of Onset   Diabetes Mother    Hypertension Mother    Healthy Father    Breast cancer Maternal Grandmother    Cancer Maternal Grandfather        brain, lung, liver   Other Paternal Grandmother    Heart attack Paternal Grandfather  Breast cancer Maternal Great-grandmother    Colon cancer Neg Hx    Ovarian cancer Neg Hx    Endometrial cancer Neg Hx    Pancreatic cancer Neg Hx    Prostate cancer Neg Hx    Social History   Socioeconomic History   Marital status: Married    Spouse name: Not on file   Number of children: Not on file   Years of education: Not on file   Highest education level: Not on file  Occupational History   Occupation: works a Archivist  Tobacco Use   Smoking status: Never   Smokeless tobacco: Never  Vaping Use   Vaping Use: Never used  Substance and Sexual Activity   Alcohol use: No   Drug use: No   Sexual activity: Yes    Birth control/protection: Surgical  Other Topics Concern   Not on file  Social History Narrative   Not on file   Social Determinants of Health   Financial Resource Strain: Low Risk  (04/13/2023)   Overall Financial Resource Strain (CARDIA)    Difficulty of Paying Living Expenses: Not hard at all  Food Insecurity: No Food Insecurity (04/13/2023)   Hunger Vital Sign    Worried About Running Out of Food in the Last Year: Never true    Ran Out of Food in the Last Year: Never true  Transportation Needs: No Transportation Needs (04/13/2023)   PRAPARE - Doctor, general practice (Medical): No    Lack of Transportation (Non-Medical): No  Physical Activity: Inactive (04/13/2023)   Exercise Vital Sign    Days of Exercise per Week: 0 days    Minutes of Exercise per Session: 0 min  Stress: No Stress Concern Present (04/13/2023)   [employer] of Occupational Health - Occupational Stress Questionnaire    Feeling of Stress : Not at all  Social Connections: Socially Integrated (04/13/2023)   Social Connection and Isolation Panel [NHANES]    Frequency of Communication with Friends and Family: More than three times a week    Frequency of Social Gatherings with Friends and Family: More than three times a week    Attends Religious Services: More than 4 times per year    Active Member of [employer] or Organizations: Yes    Attends Engineer, structural: More than 4 times per year    Marital Status: Married    Tobacco Counseling Counseling given: Not Answered   Clinical Intake:  Pre-visit preparation completed: No  Pain : No/denies pain Pain Score: 0-No pain   Nutrition Risk Assessment:  Has the patient had any N/V/D within the last 2 months?  No  Does the patient have any non-healing wounds?  No  Has the patient had any unintentional weight loss or weight gain?  No   Diabetes:  Is the patient diabetic?  Yes  If diabetic, was a CBG obtained today?  No  Did the patient bring in their glucometer from home?  No  How often do you monitor your CBG's? Daily  Financial Strains and Diabetes Management:  Are you having any financial strains with the device, your supplies or your medication? No .  Does the patient want to be seen by Chronic Care Management for management of their diabetes?  No  Would the patient like to be referred to a Nutritionist or for Diabetic Management?  No   Diabetic Exams:  Diabetic Eye Exam: Completed . Overdue for diabetic eye exam. Pt has been  advised about the importance in completing this exam. A referral has been  placed today. Message sent to referral coordinator for scheduling purposes. Advised pt to expect a call from office referred to regarding appt.  Diabetic Foot Exam: Completed . Pt has been advised about the importance in completing this exam. Pt is scheduled for diabetic foot exam on Followed by PCP.    BMI - recorded: 30.87 Nutritional Status: BMI > 30  Obese Nutritional Risks: None Diabetes: Yes CBG done?: No Did pt. bring in CBG monitor from home?: No  How often do you need to have someone help you when you read instructions, pamphlets, or other written materials from your doctor or pharmacy?: 1 - Never  Diabetic?  Yes  Interpreter Needed?: No  Information entered by :: Theresa Mulligan LPN   Activities of Daily Living    04/13/2023   10:30 AM 09/27/2022    3:57 PM  In your present state of health, do you have any difficulty performing the following activities:  Hearing? 0 0  Vision? 0 0  Difficulty concentrating or making decisions? 0 0  Walking or climbing stairs? 0 0  Dressing or bathing? 0 0  Doing errands, shopping? 0 0  Preparing Food and eating ? N   Using the Toilet? N   In the past six months, have you accidently leaked urine? Y   Comment Followed by medical attention. Pending surgery for bladder revision.   Do you have problems with loss of bowel control? N   Managing your Medications? N   Managing your Finances? N   Housekeeping or managing your Housekeeping? N     Patient Care Team: Melida Quitter, PA as PCP - General (Family Medicine) End, Cristal Deer, MD as PCP - Cardiology (Cardiology) Delfin Gant, MD as Attending Physician (Family Medicine) Jeani Hawking, MD as Consulting Physician (Gastroenterology) Levert Feinstein, MD as Consulting Physician (Neurology) Meredith Pel, NP as Nurse Practitioner (Gastroenterology) Specialists, [facility] (Orthopedic Surgery)  Indicate any recent Medical Services you may have received from other than  [facility] providers in the past year (date may be approximate).     Assessment:   This is a routine wellness examination for [place].  Hearing/Vision screen Hearing Screening - Comments:: Denies hearing difficulties   Vision Screening - Comments:: Wears rx glasses - up to date with routine eye exams with  [facility]  Dietary issues and exercise activities discussed: Exercise limited by: None identified   Goals Addressed               This Visit's Progress     No current goal (pt-stated)         Depression Screen    04/13/2023   10:29 AM 03/20/2023    2:20 PM 09/27/2022    3:56 PM 01/06/2022    3:08 PM 11/30/2021    3:11 PM 09/23/2020    1:31 PM 08/24/2020   10:02 AM  PHQ 2/9 Scores  PHQ - 2 Score 0 0 0 0 0 0   PHQ- 9 Score   2 0 0 3   Exception Documentation       Patient refusal    Fall Risk    04/13/2023   10:31 AM 03/20/2023    2:20 PM 09/27/2022    3:56 PM 01/06/2022    3:08 PM 11/30/2021    3:12 PM  Fall Risk   Falls in the past year? 0 0 0 0 0  Number falls in  past yr: 0 0 0 0 0  Injury with Fall? 0 0 0 0 0  Risk for fall due to : No Fall Risks No Fall Risks No Fall Risks No Fall Risks   Follow up Falls prevention discussed  Falls evaluation completed Falls evaluation completed Falls evaluation completed    FALL RISK PREVENTION PERTAINING TO THE HOME:  Any stairs in or around the home? Yes  If so, are there any without handrails? No  Home free of loose throw rugs in walkways, pet beds, electrical cords, etc? Yes  Adequate lighting in your home to reduce risk of falls? Yes   ASSISTIVE DEVICES UTILIZED TO PREVENT FALLS:  Life alert? No  Use of a cane, walker or w/c? No  Grab bars in the bathroom? No  Shower chair or bench in shower? Yes Elevated toilet seat or a handicapped toilet? Yes  TIMED UP AND GO:  Was the test performed? No . Audio Visit   Cognitive Function:        04/13/2023   10:32 AM  6CIT Screen  What Year? 0 points  What month? 0  points  What time? 0 points  Count back from 20 0 points  Months in reverse 0 points  Repeat phrase 0 points  Total Score 0 points    Immunizations Immunization History  Administered Date(s) Administered   Influenza Split 06/05/2011   Influenza, Seasonal, Injecte, Preservative Fre 09/14/2015   Influenza,inj,Quad PF,6+ Mos 08/16/2014, 09/15/2016, 09/25/2019, 09/23/2020   Influenza-Unspecified 09/05/2015, 09/15/2016, 09/04/2017   Pneumococcal Polysaccharide-23 09/14/2015   Tdap 02/17/2014, 11/02/2021    TDAP status: Up to date  Flu Vaccine status: Up to date    Covid-19 vaccine status: Declined, Education has been provided regarding the importance of this vaccine but patient still declined. Advised may receive this vaccine at local pharmacy or Health Dept.or vaccine clinic. Aware to provide a copy of the vaccination record if obtained from local pharmacy or Health Dept. Verbalized acceptance and understanding.  Qualifies for Shingles Vaccine? No   Zostavax completed No   Shingrix Completed?: No.    Education has been provided regarding the importance of this vaccine. Patient has been advised to call insurance company to determine out of pocket expense if they have not yet received this vaccine. Advised may also receive vaccine at local pharmacy or Health Dept. Verbalized acceptance and understanding.  Screening Tests Health Maintenance  Topic Date Due   OPHTHALMOLOGY EXAM  Never done   FOOT EXAM  09/23/2021   COVID-19 Vaccine (1) 04/29/2023 (Originally 06/04/1986)   INFLUENZA VACCINE  07/06/2023   HEMOGLOBIN A1C  09/19/2023   Diabetic kidney evaluation - eGFR measurement  03/19/2024   Diabetic kidney evaluation - Urine ACR  03/19/2024   Medicare Annual Wellness (AWV)  04/12/2024   DTaP/Tdap/Td (3 - Td or Tdap) 11/03/2031   Hepatitis C Screening  Completed   HIV Screening  Completed   HPV VACCINES  Aged Out   PAP SMEAR-Modifier  Discontinued    Health Maintenance  Health  Maintenance Due  Topic Date Due   OPHTHALMOLOGY EXAM  Never done   FOOT EXAM  09/23/2021     Lung Cancer Screening: (Low Dose CT Chest recommended if Age 32-80 years, 30 pack-year currently smoking OR have quit w/in 15years.) does not qualify.     Additional Screening:  Hepatitis C Screening: does qualify; Completed 03/20/23  Vision Screening: Recommended annual ophthalmology exams for early detection of glaucoma and other disorders of the eye. Is  the patient up to date with their annual eye exam?  Yes  Who is the provider or what is the name of the office in which the patient attends annual eye exams? [facility] If pt is not established with a provider, would they like to be referred to a provider to establish care? No .   Dental Screening: Recommended annual dental exams for proper oral hygiene  Community Resource Referral / Chronic Care Management:  CRR required this visit?  No   CCM required this visit?  No      Plan:     I have personally reviewed and noted the following in the patient's chart:   Medical and social history Use of alcohol, tobacco or illicit drugs  Current medications and supplements including opioid prescriptions. Patient is not currently taking opioid prescriptions. Functional ability and status Nutritional status Physical activity Advanced directives List of other physicians Hospitalizations, surgeries, and ER visits in previous 12 months Vitals Screenings to include cognitive, depression, and falls Referrals and appointments  In addition, I have reviewed and discussed with patient certain preventive protocols, quality metrics, and best practice recommendations. A written personalized care plan for preventive services as well as general preventive health recommendations were provided to patient.     Tillie Rung, LPN   04/10/4331   Nurse Notes: None

## 2023-04-13 NOTE — Patient Instructions (Addendum)
Samantha [name] , Thank you for taking time to come for your Medicare Wellness Visit. I appreciate your ongoing commitment to your health goals. Please review the following plan we discussed and let me know if I can assist you in the future.   These are the goals we discussed:  Goals       No current goal (pt-stated)        This is a list of the screening recommended for you and due dates:  Health Maintenance  Topic Date Due   Eye exam for diabetics  Never done   Complete foot exam   09/23/2021   COVID-19 Vaccine (1) 04/29/2023*   Flu Shot  07/06/2023   Hemoglobin A1C  09/19/2023   Yearly kidney function blood test for diabetes  03/19/2024   Yearly kidney health urinalysis for diabetes  03/19/2024   Medicare Annual Wellness Visit  04/12/2024   DTaP/Tdap/Td vaccine (3 - Td or Tdap) 11/03/2031   Hepatitis C Screening: USPSTF Recommendation to screen - Ages 58-79 yo.  Completed   HIV Screening  Completed   HPV Vaccine  Aged Out   Pap Smear  Discontinued  *Topic was postponed. The date shown is not the original due date.    Advanced directives: Advance directive discussed with you today. Even though you declined this today, please call our office should you change your mind, and we can give you the proper paperwork for you to fill out.   Conditions/risks identified: None  Next appointment: Follow up in one year for your annual wellness visit.   Preventive Care 40-64 Years, Female Preventive care refers to lifestyle choices and visits with your health care provider that can promote health and wellness. What does preventive care include? A yearly physical exam. This is also called an annual well check. Dental exams once or twice a year. Routine eye exams. Ask your health care provider how often you should have your eyes checked. Personal lifestyle choices, including: Daily care of your teeth and gums. Regular physical activity. Eating a healthy diet. Avoiding tobacco and drug  use. Limiting alcohol use. Practicing safe sex. Taking low-dose aspirin daily starting at age 34. Taking vitamin and mineral supplements as recommended by your health care provider. What happens during an annual well check? The services and screenings done by your health care provider during your annual well check will depend on your age, overall health, lifestyle risk factors, and family history of disease. Counseling  Your health care provider may ask you questions about your: Alcohol use. Tobacco use. Drug use. Emotional well-being. Home and relationship well-being. Sexual activity. Eating habits. Work and work Astronomer. Method of birth control. Menstrual cycle. Pregnancy history. Screening  You may have the following tests or measurements: Height, weight, and BMI. Blood pressure. Lipid and cholesterol levels. These may be checked every 5 years, or more frequently if you are over 99 years old. Skin check. Lung cancer screening. You may have this screening every year starting at age 38 if you have a 30-pack-year history of smoking and currently smoke or have quit within the past 15 years. Fecal occult blood test (FOBT) of the stool. You may have this test every year starting at age 46. Flexible sigmoidoscopy or colonoscopy. You may have a sigmoidoscopy every 5 years or a colonoscopy every 10 years starting at age 17. Hepatitis C blood test. Hepatitis B blood test. Sexually transmitted disease (STD) testing. Diabetes screening. This is done by checking your blood sugar (glucose) after you  have not eaten for a while (fasting). You may have this done every 1-3 years. Mammogram. This may be done every 1-2 years. Talk to your health care provider about when you should start having regular mammograms. This may depend on whether you have a family history of breast cancer. BRCA-related cancer screening. This may be done if you have a family history of breast, ovarian, tubal, or  peritoneal cancers. Pelvic exam and Pap test. This may be done every 3 years starting at age 22. Starting at age 66, this may be done every 5 years if you have a Pap test in combination with an HPV test. Bone density scan. This is done to screen for osteoporosis. You may have this scan if you are at high risk for osteoporosis. Discuss your test results, treatment options, and if necessary, the need for more tests with your health care provider. Vaccines  Your health care provider may recommend certain vaccines, such as: Influenza vaccine. This is recommended every year. Tetanus, diphtheria, and acellular pertussis (Tdap, Td) vaccine. You may need a Td booster every 10 years. Zoster vaccine. You may need this after age 75. Pneumococcal 13-valent conjugate (PCV13) vaccine. You may need this if you have certain conditions and were not previously vaccinated. Pneumococcal polysaccharide (PPSV23) vaccine. You may need one or two doses if you smoke cigarettes or if you have certain conditions. Talk to your health care provider about which screenings and vaccines you need and how often you need them. This information is not intended to replace advice given to you by your health care provider. Make sure you discuss any questions you have with your health care provider. Document Released: 12/18/2015 Document Revised: 08/10/2016 Document Reviewed: 09/22/2015 Elsevier Interactive Patient Education  2017 [employer].    Fall Prevention in the Home Falls can cause injuries. They can happen to people of all ages. There are many things you can do to make your home safe and to help prevent falls. What can I do on the outside of my home? Regularly fix the edges of walkways and driveways and fix any cracks. Remove anything that might make you trip as you walk through a door, such as a raised step or threshold. Trim any bushes or trees on the path to your home. Use bright outdoor lighting. Clear any walking  paths of anything that might make someone trip, such as rocks or tools. Regularly check to see if handrails are loose or broken. Make sure that both sides of any steps have handrails. Any raised decks and porches should have guardrails on the edges. Have any leaves, snow, or ice cleared regularly. Use sand or salt on walking paths during winter. Clean up any spills in your garage right away. This includes oil or grease spills. What can I do in the bathroom? Use night lights. Install grab bars by the toilet and in the tub and shower. Do not use towel bars as grab bars. Use non-skid mats or decals in the tub or shower. If you need to sit down in the shower, use a plastic, non-slip stool. Keep the floor dry. Clean up any water that spills on the floor as soon as it happens. Remove soap buildup in the tub or shower regularly. Attach bath mats securely with double-sided non-slip rug tape. Do not have throw rugs and other things on the floor that can make you trip. What can I do in the bedroom? Use night lights. Make sure that you have a light by  your bed that is easy to reach. Do not use any sheets or blankets that are too big for your bed. They should not hang down onto the floor. Have a firm chair that has side arms. You can use this for support while you get dressed. Do not have throw rugs and other things on the floor that can make you trip. What can I do in the kitchen? Clean up any spills right away. Avoid walking on wet floors. Keep items that you use a lot in easy-to-reach places. If you need to reach something above you, use a strong step stool that has a grab bar. Keep electrical cords out of the way. Do not use floor polish or wax that makes floors slippery. If you must use wax, use non-skid floor wax. Do not have throw rugs and other things on the floor that can make you trip. What can I do with my stairs? Do not leave any items on the stairs. Make sure that there are handrails  on both sides of the stairs and use them. Fix handrails that are broken or loose. Make sure that handrails are as long as the stairways. Check any carpeting to make sure that it is firmly attached to the stairs. Fix any carpet that is loose or worn. Avoid having throw rugs at the top or bottom of the stairs. If you do have throw rugs, attach them to the floor with carpet tape. Make sure that you have a light switch at the top of the stairs and the bottom of the stairs. If you do not have them, ask someone to add them for you. What else can I do to help prevent falls? Wear shoes that: Do not have high heels. Have rubber bottoms. Are comfortable and fit you well. Are closed at the toe. Do not wear sandals. If you use a stepladder: Make sure that it is fully opened. Do not climb a closed stepladder. Make sure that both sides of the stepladder are locked into place. Ask someone to hold it for you, if possible. Clearly mark and make sure that you can see: Any grab bars or handrails. First and last steps. Where the edge of each step is. Use tools that help you move around (mobility aids) if they are needed. These include: Canes. Walkers. Scooters. Crutches. Turn on the lights when you go into a dark area. Replace any light bulbs as soon as they burn out. Set up your furniture so you have a clear path. Avoid moving your furniture around. If any of your floors are uneven, fix them. If there are any pets around you, be aware of where they are. Review your medicines with your doctor. Some medicines can make you feel dizzy. This can increase your chance of falling. Ask your doctor what other things that you can do to help prevent falls. This information is not intended to replace advice given to you by your health care provider. Make sure you discuss any questions you have with your health care provider. Document Released: 09/17/2009 Document Revised: 04/28/2016 Document Reviewed:  12/26/2014 Elsevier Interactive Patient Education  2017 [employer].

## 2023-04-17 ENCOUNTER — Encounter: Payer: Self-pay | Admitting: Family Medicine

## 2023-05-02 ENCOUNTER — Other Ambulatory Visit: Payer: Self-pay | Admitting: Internal Medicine

## 2023-05-04 ENCOUNTER — Encounter: Payer: Self-pay | Admitting: Family Medicine

## 2023-05-18 ENCOUNTER — Ambulatory Visit: Payer: 59 | Admitting: Family Medicine

## 2023-05-18 NOTE — Progress Notes (Deleted)
   Acute Office Visit  Subjective:     Patient ID: Samantha [name], female    DOB: September 08, 1981, 42 y.o.   MRN: [id number]  No chief complaint on file.   HPI Patient is in today for "urinary issues and general health concerns".  ROS      Objective:    LMP 08/15/2014 Comment: partial hysterectomy  Physical Exam  No results found for any visits on 05/18/23.      Assessment & Plan:  There are no diagnoses linked to this encounter.  No follow-ups on file.  Melida Quitter, PA

## 2023-05-23 ENCOUNTER — Other Ambulatory Visit: Payer: Self-pay | Admitting: *Deleted

## 2023-05-23 DIAGNOSIS — E118 Type 2 diabetes mellitus with unspecified complications: Secondary | ICD-10-CM

## 2023-05-23 MED ORDER — DAPAGLIFLOZIN PROPANEDIOL 5 MG PO TABS
5.0000 mg | ORAL_TABLET | Freq: Every day | ORAL | 1 refills | Status: DC
Start: 2023-05-23 — End: 2023-06-20

## 2023-05-24 ENCOUNTER — Telehealth: Payer: Self-pay | Admitting: *Deleted

## 2023-05-24 DIAGNOSIS — N898 Other specified noninflammatory disorders of vagina: Secondary | ICD-10-CM

## 2023-05-24 DIAGNOSIS — E118 Type 2 diabetes mellitus with unspecified complications: Secondary | ICD-10-CM

## 2023-05-24 MED ORDER — PIOGLITAZONE HCL 15 MG PO TABS
15.0000 mg | ORAL_TABLET | Freq: Every day | ORAL | 1 refills | Status: DC
Start: 2023-05-24 — End: 2023-06-20

## 2023-05-24 MED ORDER — FLUCONAZOLE 150 MG PO TABS: 150.0000 mg | ORAL_TABLET | Freq: Once | ORAL | 0 refills | Status: AC

## 2023-05-24 NOTE — Telephone Encounter (Signed)
Informed pt of below.  Pt also stated that she quit taking her fenofibrate due to it causing worse vaginal symptoms.  Please advise if there is something else you would like to send in for pts cholesterol since she cannot take the fenofibrate. Please advise.

## 2023-05-24 NOTE — Telephone Encounter (Signed)
Pt calling to say that she is taking [place], and is now experiencing some vaginal itching, she said that it does show that as a side effect.  She wanted to know if she could have something to help for this.  She also wanted to know if this is a side effect that will go away. Please Advise.

## 2023-05-24 NOTE — Telephone Encounter (Signed)
Since Farxiga does increase the amount of sugar excreted in urine, it can increase the risk of vaginal infections.  I will send in a dose of fluconazole for her.

## 2023-05-24 NOTE — Telephone Encounter (Signed)
Fenofibrate is unlikely to have any effects on the genitourinary tract, I can essentially guarantee that the symptoms are due to the [place] alone.  I strongly recommend resuming fenofibrate because we really need to reduce her triglyceride levels, they were at a concerning number last time which is why we started the fenofibrate.    Instead, I would recommend stopping the [place] and we will switch to one called pioglitazone for blood sugars.  This way, we are preventing future vaginal infections while still maintaining blood sugar control with pioglitazone, treating triglyceride levels with the fenofibrate, and treating her acute infection/vaginal symptoms with the fluconazole.

## 2023-05-24 NOTE — Addendum Note (Signed)
Addended by: Saralyn Pilar on: 05/24/2023 02:07 PM   Modules accepted: Orders

## 2023-06-07 ENCOUNTER — Other Ambulatory Visit: Payer: Self-pay | Admitting: Family Medicine

## 2023-06-07 ENCOUNTER — Other Ambulatory Visit: Payer: Self-pay

## 2023-06-07 DIAGNOSIS — Z130 Encounter for screening for diseases of the blood and blood-forming organs and certain disorders involving the immune mechanism: Secondary | ICD-10-CM

## 2023-06-07 DIAGNOSIS — E118 Type 2 diabetes mellitus with unspecified complications: Secondary | ICD-10-CM

## 2023-06-07 DIAGNOSIS — R454 Irritability and anger: Secondary | ICD-10-CM

## 2023-06-07 DIAGNOSIS — F411 Generalized anxiety disorder: Secondary | ICD-10-CM

## 2023-06-07 DIAGNOSIS — E781 Pure hyperglyceridemia: Secondary | ICD-10-CM

## 2023-06-07 DIAGNOSIS — I10 Essential (primary) hypertension: Secondary | ICD-10-CM

## 2023-06-07 DIAGNOSIS — Z0000 Encounter for general adult medical examination without abnormal findings: Secondary | ICD-10-CM

## 2023-06-13 ENCOUNTER — Other Ambulatory Visit: Payer: 59

## 2023-06-14 ENCOUNTER — Other Ambulatory Visit: Payer: 59

## 2023-06-20 ENCOUNTER — Encounter: Payer: Self-pay | Admitting: Family Medicine

## 2023-06-20 ENCOUNTER — Ambulatory Visit: Payer: 59 | Admitting: Family Medicine

## 2023-06-20 VITALS — BP 118/78 | HR 83 | Resp 18 | Ht 68.0 in | Wt 206.0 lb

## 2023-06-20 DIAGNOSIS — I10 Essential (primary) hypertension: Secondary | ICD-10-CM | POA: Diagnosis not present

## 2023-06-20 DIAGNOSIS — E781 Pure hyperglyceridemia: Secondary | ICD-10-CM | POA: Diagnosis not present

## 2023-06-20 DIAGNOSIS — M797 Fibromyalgia: Secondary | ICD-10-CM | POA: Diagnosis not present

## 2023-06-20 DIAGNOSIS — E118 Type 2 diabetes mellitus with unspecified complications: Secondary | ICD-10-CM | POA: Diagnosis not present

## 2023-06-20 DIAGNOSIS — F411 Generalized anxiety disorder: Secondary | ICD-10-CM | POA: Diagnosis not present

## 2023-06-20 LAB — POCT GLYCOSYLATED HEMOGLOBIN (HGB A1C): HbA1c POC (<> result, manual entry): 8.8 % (ref 4.0–5.6)

## 2023-06-20 MED ORDER — PIOGLITAZONE HCL 30 MG PO TABS
30.0000 mg | ORAL_TABLET | Freq: Every day | ORAL | 2 refills | Status: DC
Start: 2023-06-20 — End: 2023-09-20

## 2023-06-20 MED ORDER — VALACYCLOVIR HCL 500 MG PO TABS: 500.0000 mg | ORAL_TABLET | Freq: Every day | ORAL | 1 refills | Status: AC | PRN

## 2023-06-20 MED ORDER — FLUOXETINE HCL 20 MG PO TABS
30.0000 mg | ORAL_TABLET | Freq: Every day | ORAL | 1 refills | Status: DC
Start: 2023-06-20 — End: 2023-09-20

## 2023-06-20 MED ORDER — GABAPENTIN 300 MG PO CAPS
300.0000 mg | ORAL_CAPSULE | Freq: Every day | ORAL | 1 refills | Status: DC
Start: 2023-06-20 — End: 2023-12-26

## 2023-06-20 NOTE — Progress Notes (Signed)
Established Patient Office Visit  Subjective   Patient ID: Samantha [name], female    DOB: 08-03-1981  Age: 42 y.o. MRN: [id number]  Chief Complaint  Patient presents with   Diabetes   Hypertension   Hyperlipidemia    HPI Samantha [name] is a 42 y.o. female presenting today for follow up of hypertension, hyperlipidemia, diabetes. Hypertension:  Pt denies chest pain, SOB, dizziness, edema, syncope, fatigue or heart palpitations. Taking metoprolol succinate, reports excellent compliance with treatment. Denies side effects. Hypertriglyceridemia: tolerating fenofibrate well with no myalgias or significant side effects.  The 10-year ASCVD risk score (Arnett DK, et al., 2019) is: 2.1% Diabetes: At last appointment, started dapagliflozin 5 mg daily after failing metformin and Victoza due to GI side effects.  She developed vaginal itching, switched dapagliflozin to pioglitazone 15 mg daily.  She has been tolerating the new medication well.  Denies hypoglycemic events, wounds or sores that are not healing well, increased thirst or urination. Denies vision problems, eye exam completed 04/10/2023.   ROS Negative unless otherwise noted in HPI   Objective:     BP 118/78 (BP Location: Left Arm, Patient Position: Sitting, Cuff Size: Normal)   Pulse 83   Resp 18   Ht 5\' 8"  (1.727 m)   Wt 206 lb (93.4 kg)   LMP 08/15/2014 Comment: partial hysterectomy  SpO2 94%   BMI 31.32 kg/m   Physical Exam Constitutional:      General: She is not in acute distress.    Appearance: Normal appearance.  HENT:     Head: Normocephalic and atraumatic.  Cardiovascular:     Rate and Rhythm: Normal rate and regular rhythm.     Heart sounds: No murmur heard.    No friction rub. No gallop.  Pulmonary:     Effort: Pulmonary effort is normal. No respiratory distress.     Breath sounds: No wheezing, rhonchi or rales.  Skin:    General: Skin is warm and dry.  Neurological:     Mental Status: She is alert and  oriented to person, place, and time.    Results for orders placed or performed in visit on 06/20/23  POCT HgB A1C  Result Value Ref Range   Hemoglobin A1C     HbA1c POC (<> result, manual entry) 8.8 4.0 - 5.6 %   HbA1c, POC (prediabetic range)     HbA1c, POC (controlled diabetic range)       Assessment & Plan:  Essential hypertension Assessment & Plan: Blood pressure goal of less than 130/80.  Encouraged to continue limiting salt and getting exercise is much as she can.  Continue metoprolol 25 mg daily.  Continue following with cardiology as well.   Hypertriglyceridemia Assessment & Plan: Last lipid panel: LDL 35, HDL 35, triglycerides 579.  Continue fenofibrate 54 mg daily.  Will continue to monitor.   Type 2 diabetes mellitus with complication, without long-term current use of insulin (HCC) Assessment & Plan: Last A1c on 03/20/2023 was 8.8, A1c stable at 8.8 today.  Patient has tolerated pioglitazone well after discontinuing [place].  Recommend increasing pioglitazone slowly given her sensitivity to side effects.  After 1 month of pioglitazone 30 mg daily, she can let me know if she is tolerating well enough to increase to 45 mg daily.  Otherwise, increase to maximum dose of 45 mg daily at follow-up appointment.  Will continue to monitor.  Orders: -     POCT glycosylated hemoglobin (Hb A1C) -  Pioglitazone HCl; Take 1 tablet (30 mg total) by mouth daily.  Dispense: 30 tablet; Refill: 2  GAD (generalized anxiety disorder) Assessment & Plan: Patient has experienced increasing anxiety and wants to try a small increase in her Prozac.  Currently taking 20 mg, recommend starting by taking 30 mg daily (1.5 tablets).  I let her know that it is safe to take up to 2 full tablets for 40 mg daily if needed.  If she does not tolerate this, other options would be to trial BuSpar twice daily for anxiety, or hydroxyzine or propranolol as needed for panic attacks.  Orders: -     FLUoxetine  HCl; Take 1.5 tablets (30 mg total) by mouth daily.  Dispense: 135 tablet; Refill: 1  Fibromyalgia -     Gabapentin; Take 1 capsule (300 mg total) by mouth at bedtime.  Dispense: 90 capsule; Refill: 1  Other orders -     valACYclovir HCl; Take 1 tablet (500 mg total) by mouth daily as needed (cold sores).  Dispense: 15 tablet; Refill: 1    Return in about 3 months (around 09/20/2023) for follow-up for HTN, HLD, DM, fasting blood work 1 week before.    Melida Quitter, PA

## 2023-06-20 NOTE — Assessment & Plan Note (Signed)
Blood pressure goal of less than 130/80.  Encouraged to continue limiting salt and getting exercise is much as she can.  Continue metoprolol 25 mg daily.  Continue following with cardiology as well.

## 2023-06-20 NOTE — Patient Instructions (Addendum)
Now that you have completed the course of prescription strength vitamin D, you can switch to an over-the-counter vitamin D3 2000 units daily to maintain vitamin D levels.  Start with one and a half tablets of Prozac. If you need to, you can take up to 2 full tablets.  After 1 month, let me know if you feel ready to increase the pioglitazone.

## 2023-06-20 NOTE — Assessment & Plan Note (Signed)
Last lipid panel: LDL 35, HDL 35, triglycerides 579.  Continue fenofibrate 54 mg daily.  Will continue to monitor.

## 2023-06-20 NOTE — Assessment & Plan Note (Addendum)
Last A1c on 03/20/2023 was 8.8, A1c stable at 8.8 today.  Patient has tolerated pioglitazone well after discontinuing [place].  Recommend increasing pioglitazone slowly given her sensitivity to side effects.  After 1 month of pioglitazone 30 mg daily, she can let me know if she is tolerating well enough to increase to 45 mg daily.  Otherwise, increase to maximum dose of 45 mg daily at follow-up appointment.  Will continue to monitor.

## 2023-06-20 NOTE — Assessment & Plan Note (Signed)
Patient has experienced increasing anxiety and wants to try a small increase in her Prozac.  Currently taking 20 mg, recommend starting by taking 30 mg daily (1.5 tablets).  I let her know that it is safe to take up to 2 full tablets for 40 mg daily if needed.  If she does not tolerate this, other options would be to trial BuSpar twice daily for anxiety, or hydroxyzine or propranolol as needed for panic attacks.

## 2023-06-25 DIAGNOSIS — S8391XA Sprain of unspecified site of right knee, initial encounter: Secondary | ICD-10-CM | POA: Diagnosis not present

## 2023-06-27 ENCOUNTER — Encounter: Payer: Self-pay | Admitting: Neurology

## 2023-06-27 ENCOUNTER — Ambulatory Visit: Payer: 59 | Admitting: Neurology

## 2023-06-27 DIAGNOSIS — M47816 Spondylosis without myelopathy or radiculopathy, lumbar region: Secondary | ICD-10-CM | POA: Diagnosis not present

## 2023-07-05 DIAGNOSIS — M5416 Radiculopathy, lumbar region: Secondary | ICD-10-CM | POA: Diagnosis not present

## 2023-07-05 DIAGNOSIS — M545 Low back pain, unspecified: Secondary | ICD-10-CM | POA: Diagnosis not present

## 2023-07-27 ENCOUNTER — Encounter: Payer: Self-pay | Admitting: Family Medicine

## 2023-08-17 ENCOUNTER — Ambulatory Visit: Payer: 59 | Attending: Internal Medicine | Admitting: Internal Medicine

## 2023-08-17 NOTE — Progress Notes (Deleted)
Cardiology Office Note:  .   Date:  08/17/2023  ID:  Samantha [name], DOB 1981-10-24, MRN [id number] PCP: Melida Quitter, PA  [facility] Providers Cardiologist:  Yvonne Kendall, MD { Click to update primary MD,subspecialty MD or APP then REFRESH:1}    History of Present Illness: .   Samantha [name] is a 42 y.o. female with history of dilated aortic root, atypical chest pain, palpitations, hypertension, hyperlipidemia, 2 diabetes mellitus, hepatic steatosis, fibromyalgia, morbid obesity, and obstructive sleep apnea not on CPAP, who ***.  She was last seen in our office in 07/2021 by Eula Listen, PA, for preoperative cardiovascular risk assessment.  She noted continued chest wall discomfort at times when reaching for certain objects.  She did not have any exertional symptoms.  No medication changes or additional testing were pursued; preceding echo and MPI were reassuring.  ROS: See HPI  Studies Reviewed: .        *** Risk Assessment/Calculations:   {Does this patient have ATRIAL FIBRILLATION?:[id number]} No BP recorded.  {Refresh Note OR Click here to enter BP  :1}***       Physical Exam:   VS:  LMP 08/15/2014 Comment: partial hysterectomy   Wt Readings from Last 3 Encounters:  06/20/23 206 lb (93.4 kg)  04/13/23 203 lb (92.1 kg)  03/20/23 208 lb (94.3 kg)    General:  NAD. Neck: No JVD or HJR. Lungs: Clear to auscultation bilaterally without wheezes or crackles. Heart: Regular rate and rhythm without murmurs, rubs, or gallops. Abdomen: Soft, nontender, nondistended. Extremities: No lower extremity edema.  ASSESSMENT AND PLAN: .    ***    {Are you ordering a CV Procedure (e.g. stress test, cath, DCCV, TEE, etc)?   Press F2        :[id number]}  Dispo: ***  Signed, Yvonne Kendall, MD

## 2023-08-18 ENCOUNTER — Encounter: Payer: Self-pay | Admitting: Internal Medicine

## 2023-09-12 ENCOUNTER — Other Ambulatory Visit: Payer: Self-pay

## 2023-09-12 DIAGNOSIS — I10 Essential (primary) hypertension: Secondary | ICD-10-CM

## 2023-09-12 DIAGNOSIS — E781 Pure hyperglyceridemia: Secondary | ICD-10-CM

## 2023-09-12 DIAGNOSIS — E118 Type 2 diabetes mellitus with unspecified complications: Secondary | ICD-10-CM

## 2023-09-13 ENCOUNTER — Other Ambulatory Visit: Payer: 59

## 2023-09-14 ENCOUNTER — Other Ambulatory Visit: Payer: 59

## 2023-09-14 DIAGNOSIS — I10 Essential (primary) hypertension: Secondary | ICD-10-CM | POA: Diagnosis not present

## 2023-09-14 DIAGNOSIS — E781 Pure hyperglyceridemia: Secondary | ICD-10-CM | POA: Diagnosis not present

## 2023-09-14 DIAGNOSIS — E118 Type 2 diabetes mellitus with unspecified complications: Secondary | ICD-10-CM | POA: Diagnosis not present

## 2023-09-15 LAB — LIPID PANEL
Chol/HDL Ratio: 3.8 {ratio} (ref 0.0–4.4)
Cholesterol, Total: 184 mg/dL (ref 100–199)
HDL: 49 mg/dL (ref >39)
LDL Chol Calc (NIH): 84 mg/dL (ref 0–99)
Triglycerides: 315 mg/dL — ABNORMAL HIGH (ref 0–149)
VLDL Cholesterol Cal: 51 mg/dL — ABNORMAL HIGH (ref 5–40)

## 2023-09-15 LAB — COMPREHENSIVE METABOLIC PANEL
ALT: 14 [IU]/L (ref 0–32)
AST: 12 [IU]/L (ref 0–40)
Albumin: 4.3 g/dL (ref 3.9–4.9)
Alkaline Phosphatase: 87 [IU]/L (ref 44–121)
BUN/Creatinine Ratio: 21 (ref 9–23)
BUN: 12 mg/dL (ref 6–24)
Bilirubin Total: 0.3 mg/dL (ref 0.0–1.2)
CO2: 23 mmol/L (ref 20–29)
Calcium: 9.2 mg/dL (ref 8.7–10.2)
Chloride: 98 mmol/L (ref 96–106)
Creatinine, Ser: 0.57 mg/dL (ref 0.57–1.00)
Globulin, Total: 2.5 g/dL (ref 1.5–4.5)
Glucose: 160 mg/dL — ABNORMAL HIGH (ref 70–99)
Potassium: 4.5 mmol/L (ref 3.5–5.2)
Sodium: 137 mmol/L (ref 134–144)
Total Protein: 6.8 g/dL (ref 6.0–8.5)
eGFR: 115 mL/min/{1.73_m2} (ref >59)

## 2023-09-15 LAB — HEMOGLOBIN A1C
Est. average glucose Bld gHb Est-mCnc: 174 mg/dL
Hgb A1c MFr Bld: 7.7 % — ABNORMAL HIGH (ref 4.8–5.6)

## 2023-09-20 ENCOUNTER — Encounter: Payer: Self-pay | Admitting: Family Medicine

## 2023-09-20 ENCOUNTER — Ambulatory Visit (INDEPENDENT_AMBULATORY_CARE_PROVIDER_SITE_OTHER): Payer: 59 | Admitting: Family Medicine

## 2023-09-20 VITALS — BP 114/66 | HR 81 | Resp 18 | Ht 68.0 in | Wt 212.0 lb

## 2023-09-20 DIAGNOSIS — I10 Essential (primary) hypertension: Secondary | ICD-10-CM | POA: Diagnosis not present

## 2023-09-20 DIAGNOSIS — G43909 Migraine, unspecified, not intractable, without status migrainosus: Secondary | ICD-10-CM

## 2023-09-20 DIAGNOSIS — E118 Type 2 diabetes mellitus with unspecified complications: Secondary | ICD-10-CM

## 2023-09-20 DIAGNOSIS — Z23 Encounter for immunization: Secondary | ICD-10-CM | POA: Diagnosis not present

## 2023-09-20 DIAGNOSIS — F411 Generalized anxiety disorder: Secondary | ICD-10-CM

## 2023-09-20 DIAGNOSIS — G43119 Migraine with aura, intractable, without status migrainosus: Secondary | ICD-10-CM

## 2023-09-20 DIAGNOSIS — E781 Pure hyperglyceridemia: Secondary | ICD-10-CM

## 2023-09-20 MED ORDER — FENOFIBRATE 54 MG PO TABS
54.0000 mg | ORAL_TABLET | Freq: Every day | ORAL | 2 refills | Status: DC
Start: 2023-09-20 — End: 2023-12-26

## 2023-09-20 MED ORDER — LANCET DEVICE MISC
1.0000 | Freq: Three times a day (TID) | 0 refills | Status: AC
Start: 2023-09-20 — End: 2023-10-20

## 2023-09-20 MED ORDER — LANCETS MISC. MISC
1.0000 | Freq: Three times a day (TID) | 0 refills | Status: AC
Start: 2023-09-20 — End: 2023-10-20

## 2023-09-20 MED ORDER — NURTEC 75 MG PO TBDP: 75.0000 mg | ORAL_TABLET | ORAL | 2 refills | Status: DC

## 2023-09-20 MED ORDER — BLOOD GLUCOSE MONITORING SUPPL DEVI
1.0000 | Freq: Three times a day (TID) | 0 refills | Status: AC
Start: 2023-09-20 — End: ?

## 2023-09-20 MED ORDER — MOUNJARO 2.5 MG/0.5ML ~~LOC~~ SOAJ
2.5000 mg | SUBCUTANEOUS | 1 refills | Status: DC
Start: 2023-09-20 — End: 2023-12-26

## 2023-09-20 MED ORDER — FLUOXETINE HCL 20 MG PO TABS
30.0000 mg | ORAL_TABLET | Freq: Every day | ORAL | 1 refills | Status: DC
Start: 2023-09-20 — End: 2023-12-26

## 2023-09-20 MED ORDER — MOUNJARO 5 MG/0.5ML ~~LOC~~ SOAJ
5.0000 mg | SUBCUTANEOUS | 3 refills | Status: DC
Start: 2023-10-18 — End: 2023-12-26

## 2023-09-20 MED ORDER — PIOGLITAZONE HCL 30 MG PO TABS
30.0000 mg | ORAL_TABLET | Freq: Every day | ORAL | 2 refills | Status: DC
Start: 2023-09-20 — End: 2023-12-26

## 2023-09-20 MED ORDER — SUMATRIPTAN SUCCINATE 50 MG PO TABS
50.0000 mg | ORAL_TABLET | Freq: Once | ORAL | 0 refills | Status: DC | PRN
Start: 2023-09-20 — End: 2024-09-25

## 2023-09-20 MED ORDER — BLOOD GLUCOSE TEST VI STRP
1.0000 | ORAL_STRIP | Freq: Three times a day (TID) | 0 refills | Status: DC
Start: 2023-09-20 — End: 2024-01-25

## 2023-09-20 NOTE — Assessment & Plan Note (Signed)
Last lipid panel: LDL 84, HDL 49, triglycerides 315. Triglycerides still elevated at 315 but improved from 579.  Continue fenofibrate 54 mg daily, starting Mounjaro to improve blood sugar control which may also further improve triglyceride levels.  Will continue to monitor.

## 2023-09-20 NOTE — Assessment & Plan Note (Signed)
A1c improved to 7.7 from 8.8.
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
## 2023-09-20 NOTE — Assessment & Plan Note (Signed)
Stable.  Continue Prozac 30 mg daily.  Will continue to monitor.

## 2023-09-20 NOTE — Assessment & Plan Note (Signed)
Blood pressure goal of less than 130/80.  Encouraged to continue limiting salt and getting exercise is much as she can.  Continue metoprolol 25 mg daily.  Continue following with cardiology as well.

## 2023-10-25 ENCOUNTER — Telehealth: Payer: Self-pay

## 2023-10-25 DIAGNOSIS — G43119 Migraine with aura, intractable, without status migrainosus: Secondary | ICD-10-CM

## 2023-10-25 MED ORDER — NURTEC 75 MG PO TBDP: 75.0000 mg | ORAL_TABLET | ORAL | 3 refills | Status: DC

## 2023-10-25 NOTE — Telephone Encounter (Signed)
Refill sent.

## 2023-10-25 NOTE — Telephone Encounter (Signed)
Pt husband called requesting refill on behalf of the pt for Nurtec. Pharmacy request from 10/18/23 in [facility] for more details.

## 2023-12-14 ENCOUNTER — Other Ambulatory Visit: Payer: 59

## 2023-12-18 ENCOUNTER — Other Ambulatory Visit: Payer: Self-pay | Admitting: Internal Medicine

## 2023-12-19 NOTE — Telephone Encounter (Signed)
 Please contact pt for future appointment. Pt hasn't been seen since 2022 pt overdue for follow up. Pt must be seen yearly for continuous refill.

## 2023-12-21 ENCOUNTER — Ambulatory Visit: Payer: 59 | Admitting: Family Medicine

## 2023-12-22 ENCOUNTER — Other Ambulatory Visit: Payer: 59

## 2023-12-22 DIAGNOSIS — E781 Pure hyperglyceridemia: Secondary | ICD-10-CM | POA: Diagnosis not present

## 2023-12-22 DIAGNOSIS — E118 Type 2 diabetes mellitus with unspecified complications: Secondary | ICD-10-CM | POA: Diagnosis not present

## 2023-12-22 DIAGNOSIS — I10 Essential (primary) hypertension: Secondary | ICD-10-CM

## 2023-12-23 LAB — CBC WITH DIFFERENTIAL/PLATELET
Basophils Absolute: 0 10*3/uL (ref 0.0–0.2)
Basos: 1 %
EOS (ABSOLUTE): 0.1 10*3/uL (ref 0.0–0.4)
Eos: 2 %
Hematocrit: 41.8 % (ref 34.0–46.6)
Hemoglobin: 13.6 g/dL (ref 11.1–15.9)
Immature Grans (Abs): 0 10*3/uL (ref 0.0–0.1)
Immature Granulocytes: 0 %
Lymphocytes Absolute: 2.2 10*3/uL (ref 0.7–3.1)
Lymphs: 36 %
MCH: 28.3 pg (ref 26.6–33.0)
MCHC: 32.5 g/dL (ref 31.5–35.7)
MCV: 87 fL (ref 79–97)
Monocytes Absolute: 0.4 10*3/uL (ref 0.1–0.9)
Monocytes: 6 %
Neutrophils Absolute: 3.5 10*3/uL (ref 1.4–7.0)
Neutrophils: 55 %
Platelets: 216 10*3/uL (ref 150–450)
RBC: 4.8 x10E6/uL (ref 3.77–5.28)
RDW: 12.4 % (ref 11.7–15.4)
WBC: 6.2 10*3/uL (ref 3.4–10.8)

## 2023-12-23 LAB — LIPID PANEL
Chol/HDL Ratio: 3.7 {ratio} (ref 0.0–4.4)
Cholesterol, Total: 175 mg/dL (ref 100–199)
HDL: 47 mg/dL (ref >39)
LDL Chol Calc (NIH): 100 mg/dL — ABNORMAL HIGH (ref 0–99)
Triglycerides: 158 mg/dL — ABNORMAL HIGH (ref 0–149)
VLDL Cholesterol Cal: 28 mg/dL (ref 5–40)

## 2023-12-23 LAB — COMPREHENSIVE METABOLIC PANEL
ALT: 14 [IU]/L (ref 0–32)
AST: 14 [IU]/L (ref 0–40)
Albumin: 4.6 g/dL (ref 3.9–4.9)
Alkaline Phosphatase: 61 [IU]/L (ref 44–121)
BUN/Creatinine Ratio: 18 (ref 9–23)
BUN: 11 mg/dL (ref 6–24)
Bilirubin Total: 0.2 mg/dL (ref 0.0–1.2)
CO2: 25 mmol/L (ref 20–29)
Calcium: 9.6 mg/dL (ref 8.7–10.2)
Chloride: 101 mmol/L (ref 96–106)
Creatinine, Ser: 0.6 mg/dL (ref 0.57–1.00)
Globulin, Total: 2.3 g/dL (ref 1.5–4.5)
Glucose: 91 mg/dL (ref 70–99)
Potassium: 4.5 mmol/L (ref 3.5–5.2)
Sodium: 141 mmol/L (ref 134–144)
Total Protein: 6.9 g/dL (ref 6.0–8.5)
eGFR: 113 mL/min/{1.73_m2} (ref >59)

## 2023-12-23 LAB — HEMOGLOBIN A1C
Est. average glucose Bld gHb Est-mCnc: 137 mg/dL
Hgb A1c MFr Bld: 6.4 % — ABNORMAL HIGH (ref 4.8–5.6)

## 2023-12-25 ENCOUNTER — Encounter: Payer: Self-pay | Admitting: Family Medicine

## 2023-12-26 ENCOUNTER — Encounter: Payer: Self-pay | Admitting: Family Medicine

## 2023-12-26 ENCOUNTER — Ambulatory Visit (INDEPENDENT_AMBULATORY_CARE_PROVIDER_SITE_OTHER): Payer: 59 | Admitting: Family Medicine

## 2023-12-26 VITALS — BP 118/72 | HR 71 | Ht 68.0 in | Wt 203.0 lb

## 2023-12-26 DIAGNOSIS — F411 Generalized anxiety disorder: Secondary | ICD-10-CM

## 2023-12-26 DIAGNOSIS — E785 Hyperlipidemia, unspecified: Secondary | ICD-10-CM | POA: Diagnosis not present

## 2023-12-26 DIAGNOSIS — Z23 Encounter for immunization: Secondary | ICD-10-CM

## 2023-12-26 DIAGNOSIS — I152 Hypertension secondary to endocrine disorders: Secondary | ICD-10-CM | POA: Diagnosis not present

## 2023-12-26 DIAGNOSIS — E118 Type 2 diabetes mellitus with unspecified complications: Secondary | ICD-10-CM

## 2023-12-26 DIAGNOSIS — M797 Fibromyalgia: Secondary | ICD-10-CM | POA: Diagnosis not present

## 2023-12-26 DIAGNOSIS — E1169 Type 2 diabetes mellitus with other specified complication: Secondary | ICD-10-CM

## 2023-12-26 DIAGNOSIS — E1159 Type 2 diabetes mellitus with other circulatory complications: Secondary | ICD-10-CM

## 2023-12-26 DIAGNOSIS — E781 Pure hyperglyceridemia: Secondary | ICD-10-CM | POA: Diagnosis not present

## 2023-12-26 MED ORDER — FENOFIBRATE 54 MG PO TABS: 54.0000 mg | ORAL_TABLET | Freq: Every day | ORAL | 1 refills | Status: DC

## 2023-12-26 MED ORDER — MOUNJARO 7.5 MG/0.5ML ~~LOC~~ SOAJ: 7.5000 mg | SUBCUTANEOUS | 0 refills | Status: DC

## 2023-12-26 MED ORDER — FLUOXETINE HCL 40 MG PO CAPS
40.0000 mg | ORAL_CAPSULE | Freq: Every day | ORAL | 3 refills | Status: AC
Start: 2023-12-26 — End: ?

## 2023-12-26 MED ORDER — GABAPENTIN 300 MG PO CAPS: 300.0000 mg | ORAL_CAPSULE | Freq: Every day | ORAL | 1 refills | Status: DC

## 2023-12-26 NOTE — Assessment & Plan Note (Signed)
Blood pressure goal of less than 130/80.  Encouraged to continue limiting salt and getting exercise is much as she can.  Continue metoprolol 25 mg daily.  Continue following with cardiology as well.

## 2023-12-26 NOTE — Patient Instructions (Addendum)
You are doing AMAZING, I am so excited for you!  STOP pioglitazone (Actos). INCREASE Mounjaro to 7.5 mg weekly. INCREASE Prozac to 40 mg daily.

## 2023-12-26 NOTE — Assessment & Plan Note (Signed)
A1c further improved to 6.4 from 7.7.  Continue Mounjaro 5 mg weekly.

## 2023-12-26 NOTE — Assessment & Plan Note (Signed)
Last lipid panel: LDL 100, HDL 47, triglycerides 158. Triglycerides continue to improve and are now close to within normal limits.  Continue fenofibrate 54 mg daily, Mounjaro to improve blood sugar control which may also further improve triglyceride levels.  Will continue to monitor.

## 2023-12-26 NOTE — Progress Notes (Signed)
Established Patient Office Visit  Subjective   Patient ID: Samantha [name], female    DOB: 11-Dec-1980  Age: 43 y.o. MRN: [id number]  Chief Complaint  Patient presents with   Diabetes   Hyperlipidemia   Hypertension    HPI Samantha [name] is a 43 y.o. female presenting today for follow up of hypertension, hyperlipidemia, diabetes. Hypertension: Pt denies chest pain, SOB, dizziness, edema, syncope, fatigue or heart palpitations. Taking metoprolol, reports excellent compliance with treatment. Denies side effects. Hyperlipidemia: tolerating fenofibrate well with no myalgias or significant side effects.  The 10-year ASCVD risk score (Arnett DK, et al., 2019) is: 1.7% Diabetes: denies hypoglycemic events, wounds or sores that are not healing well, increased thirst or urination. Denies vision problems, eye exam up-to-date. Taking Mounjaro and pioglitazone as prescribed without any side effects.   Outpatient Medications Prior to Visit  Medication Sig   Blood Glucose Monitoring Suppl DEVI 1 each by Does not apply route in the morning, at noon, and at bedtime. May substitute to any manufacturer covered by patient's insurance.   cetirizine (ZYRTEC) 10 MG tablet Take 10 mg by mouth daily.   metoprolol succinate (TOPROL-XL) 25 MG 24 hr tablet Take 0.5 tablets (12.5 mg total) by mouth daily. PLEASE KEEP APPT SCHEDULED 01-01-24 FOR FUTURE REFILLS. (FIRST ATTEMPT)   Rimegepant Sulfate (NURTEC) 75 MG TBDP Take 1 tablet (75 mg total) by mouth every other day.   SUMAtriptan (IMITREX) 50 MG tablet Take 1 tablet (50 mg total) by mouth once as needed for up to 1 dose for migraine. May repeat in 2 hours if headache persists or recurs.   valACYclovir (VALTREX) 500 MG tablet Take 1 tablet (500 mg total) by mouth daily as needed (cold sores).   [DISCONTINUED] fenofibrate 54 MG tablet Take 1 tablet (54 mg total) by mouth daily.   [DISCONTINUED] FLUoxetine (PROZAC) 20 MG tablet Take 1.5 tablets (30 mg total) by mouth  daily.   [DISCONTINUED] gabapentin (NEURONTIN) 300 MG capsule Take 1 capsule (300 mg total) by mouth at bedtime.   [DISCONTINUED] pioglitazone (ACTOS) 30 MG tablet Take 1 tablet (30 mg total) by mouth daily.   [DISCONTINUED] tirzepatide [facility]) 5 MG/0.5ML Pen Inject 5 mg into the skin once a week.   [DISCONTINUED] tirzepatide [facility]) 2.5 MG/0.5ML Pen Inject 2.5 mg into the skin once a week. After 4 weeks, if tolerating well increase to 5 mg weekly.   No facility-administered medications prior to visit.    ROS Negative unless otherwise noted in HPI   Objective:     BP 118/72   Pulse 71   Ht 5\' 8"  (1.727 m)   Wt 203 lb (92.1 kg)   LMP 08/15/2014 Comment: partial hysterectomy  SpO2 96%   BMI 30.87 kg/m   Physical Exam Constitutional:      General: She is not in acute distress.    Appearance: Normal appearance.  HENT:     Head: Normocephalic and atraumatic.  Cardiovascular:     Rate and Rhythm: Normal rate and regular rhythm.     Heart sounds: No murmur heard.    No friction rub. No gallop.  Pulmonary:     Effort: Pulmonary effort is normal. No respiratory distress.     Breath sounds: No wheezing, rhonchi or rales.  Skin:    General: Skin is warm and dry.  Neurological:     Mental Status: She is alert and oriented to person, place, and time.      Assessment & Plan:  Hypertension associated with diabetes (HCC) Assessment & Plan: Blood pressure goal of less than 130/80.  Encouraged to continue limiting salt and getting exercise is much as she can.  Continue metoprolol 25 mg daily.  Continue following with cardiology as well.   Hyperlipidemia associated with type 2 diabetes mellitus (HCC) Assessment & Plan: Last lipid panel: LDL 100, HDL 47, triglycerides 158. Triglycerides continue to improve and are now close to within normal limits.  Continue fenofibrate 54 mg daily, Mounjaro to improve blood sugar control which may also further improve triglyceride levels.  Will  continue to monitor.  Orders: -     Fenofibrate; Take 1 tablet (54 mg total) by mouth daily.  Dispense: 90 tablet; Refill: 1  Type 2 diabetes mellitus with complication, without long-term current use of insulin (HCC) Assessment & Plan: A1c further improved to 6.4 from 7.7.  Continue Mounjaro 5 mg weekly.  Orders: -     Mounjaro; Inject 7.5 mg into the skin once a week.  Dispense: 2 mL; Refill: 0  Hypertriglyceridemia -     Fenofibrate; Take 1 tablet (54 mg total) by mouth daily.  Dispense: 90 tablet; Refill: 1  GAD (generalized anxiety disorder) -     FLUoxetine HCl; Take 1 capsule (40 mg total) by mouth daily.  Dispense: 90 capsule; Refill: 3  Fibromyalgia -     Gabapentin; Take 1 capsule (300 mg total) by mouth at bedtime.  Dispense: 90 capsule; Refill: 1  Need for pneumococcal 20-valent conjugate vaccination -     Pneumococcal conjugate vaccine 20-valent    Return in about 4 months (around 04/24/2024) for follow-up for HTN, HLD, DM, fasting labs 1 week before.  As long as A1c remains in goal, follow-up every 4-6 months.   Melida Quitter, PA

## 2024-01-01 ENCOUNTER — Ambulatory Visit: Payer: 59 | Attending: Physician Assistant | Admitting: Student

## 2024-01-01 ENCOUNTER — Encounter: Payer: Self-pay | Admitting: Student

## 2024-01-01 VITALS — BP 110/70 | HR 75 | Ht 69.0 in | Wt 194.6 lb

## 2024-01-01 DIAGNOSIS — I152 Hypertension secondary to endocrine disorders: Secondary | ICD-10-CM

## 2024-01-01 DIAGNOSIS — H538 Other visual disturbances: Secondary | ICD-10-CM

## 2024-01-01 DIAGNOSIS — R002 Palpitations: Secondary | ICD-10-CM | POA: Diagnosis not present

## 2024-01-01 DIAGNOSIS — E1159 Type 2 diabetes mellitus with other circulatory complications: Secondary | ICD-10-CM | POA: Diagnosis not present

## 2024-01-01 DIAGNOSIS — R42 Dizziness and giddiness: Secondary | ICD-10-CM

## 2024-01-01 DIAGNOSIS — I77819 Aortic ectasia, unspecified site: Secondary | ICD-10-CM

## 2024-01-01 DIAGNOSIS — R0789 Other chest pain: Secondary | ICD-10-CM

## 2024-01-01 MED ORDER — METOPROLOL SUCCINATE ER 25 MG PO TB24: 12.5000 mg | ORAL_TABLET | Freq: Every day | ORAL | 3 refills | Status: AC

## 2024-01-01 NOTE — Patient Instructions (Signed)
Medication Instructions:  Your Physician recommend you continue on your current medication as directed.    *If you need a refill on your cardiac medications before your next appointment, please call your pharmacy*   Lab Work: None ordered at this time  If you have labs (blood work) drawn today and your tests are completely normal, you will receive your results only by: MyChart Message (if you have MyChart) OR A paper copy in the mail If you have any lab test that is abnormal or we need to change your treatment, we will call you to review the results.   Testing/Procedures: Your physician has requested that you have a carotid duplex. This test is an ultrasound of the carotid arteries in your neck. It looks at blood flow through these arteries that supply the brain with blood.   Allow one hour for this exam.  There are no restrictions or special instructions.  This will take place at [address] [facility]) [address], [place] [address]  Please note: We ask at that you not bring children with you during ultrasound (echo/ vascular) testing. Due to room size and safety concerns, children are not allowed in the ultrasound rooms during exams. Our front office staff cannot provide observation of children in our lobby area while testing is being conducted. An adult accompanying a patient to their appointment will only be allowed in the ultrasound room at the discretion of the ultrasound technician under special circumstances. We apologize for any inconvenience.    CT Angiography (CTA) chest/aorta, is a special type of CT scan that uses a computer to produce multi-dimensional views of major blood vessels throughout the body. In CT angiography, a contrast material is injected through an IV to help visualize the blood vessels  Nothing to eat or drink 4 hours prior to test  [facility] [address]  [place], [place] [address]     Follow-Up: At [facility], you and your health needs are our priority.  As part of our continuing mission to provide you with exceptional heart care, we have created designated Provider Care Teams.  These Care Teams include your primary Cardiologist (physician) and Advanced Practice Providers (APPs -  Physician Assistants and Nurse Practitioners) who all work together to provide you with the care you need, when you need it.    Your next appointment:   1 year(s)  Provider:   You may see Yvonne Kendall, MD or one of the following Advanced Practice Providers on your designated Care Team:   Nicolasa Ducking, NP Eula Listen, PA-C Cadence Fransico Michael, PA-C Charlsie Quest, NP Carlos Levering, NP

## 2024-01-01 NOTE — Progress Notes (Signed)
Cardiology Clinic Note   Date: 01/01/2024 ID: SKILER TYE, DOB 1981/04/09, MRN [id number]  Primary Cardiologist:  Yvonne Kendall, MD  Patient Profile    Samantha [name] is a 43 y.o. female who presents to the clinic today for overdue follow up.     Past medical history significant for: Atypical chest pain. Nuclear stress test 06/09/2020: Normal pharmacologic myocardial perfusion stress test without significant ischemia or scar.  No significant coronary artery calcification on attenuation correction CT.  Low risk study. Aortic dilatation. Echo 08/20/2021: EF 60 to 65%.  No RWMA.  Grade I DD.  Normal RV size/function.  Mild LAE.  Aortic valve not well-visualized.  Trivial AI.  Mild dilatation of aortic root 42 mm, mild dilatation of ascending aorta 40 mm Palpitations.  Hypertension. Hyperlipidemia. Lipid panel 12/22/2023: LDL 100, HDL 47, TG 158, total 175. Migraines. OSA. GERD. T2DM. Mood disorder. GAD. Beals syndrome (FBN2).   In summary, in May 2021 patient underwent CT of abdomen pelvis with an incidental finding of enlarged heart.  She was noted to have multiple ED visits for chest wall pain.  She reported exertional chest discomfort and swelling overlying the suprasternal area as well as intermittent palpitations.  She was tachycardic at the time of her visit and placed on low-dose metoprolol.  Echo in June 2021 showed normal LV/RV function, no RWMA, normal diastolic parameters, trivial MR/AI, mildly dilated aortic root 41 mm.  In June 2021 she continued to have intermittent left upper chest discomfort and underwent nuclear stress testing which was a low risk study as detailed above.  Her palpitations are well-controlled with beta-blocker.      History of Present Illness    Samantha [name] is followed by Dr. Okey Dupre for the above outlined history.  Patient was last seen in the office by Eula Listen, PA-C on 07/16/2021 for preoperative risk assessment pending transvaginal tape and  cystoscopy.  She was doing well at that time and determined to be an acceptable risk for upcoming surgery.  Echo was updated September 2022 and showed normal LV/RV function, Grade I DD, stable mild dilatation of aortic root and ascending aorta.  Today, patient is accompanied by her husband. She is doing well overall. She continues to have chest wall pain. 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 Her chest wall discomfort is unchanged from previous. She will get mid sternal swelling and tenderness to palpation when it occurs. Patient denies shortness of breath, dyspnea on exertion, lower extremity edema, orthopnea or PND. No palpitations. 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 She reports her sister has the "heart problems" associated with it and other family members have varying manifestations. Her daughters have tested positive but are not showing any signs. She reports occasional positional dizziness and transient blurred vision out of right eye. She has a remote history of neck trauma and was told she has a "leak" in her right carotid. She does get intermittent right sided anterior neck pain.      ROS: All other systems reviewed and are otherwise negative except as noted in History of Present Illness.  EKGs/Labs Reviewed    EKG Interpretation Date/Time:  Monday January 01 2024 10:08:09 EST Ventricular Rate:  75 PR Interval:  172 QRS Duration:  100 QT Interval:  410 QTC Calculation: 457 R Axis:   -21  Text Interpretation: Normal sinus rhythm Moderate voltage  criteria for LVH, may be normal variant ( R in aVL , Cornell product ) T wave abnormality, consider anterolateral ischemia When compared with ECG of 07/16/2021  (Not in Muse) No significant change Confirmed by Carlos Levering [id number]) on 01/01/2024 10:22:10 AM   12/22/2023: ALT 14; AST 14; BUN 11; Creatinine, Ser 0.60;  Potassium 4.5; Sodium 141   12/22/2023: Hemoglobin 13.6; WBC 6.2    Physical Exam    VS:  BP 110/70   Pulse 75   Ht 5\' 9"  (1.753 m)   Wt 194 lb 9.6 oz (88.3 kg)   LMP 08/15/2014 Comment: partial hysterectomy  SpO2 98%   BMI 28.74 kg/m  , BMI Body mass index is 28.74 kg/m.  GEN: Well nourished, well developed, in no acute distress. Neck: No JVD or carotid bruits. Cardiac:  RRR. No murmurs. No rubs or gallops.   Respiratory:  Respirations regular and unlabored. Clear to auscultation without rales, wheezing or rhonchi. GI: Soft, nontender, nondistended. Extremities: Radials/DP/PT 2+ and equal bilaterally. No clubbing or cyanosis. No edema.  Skin: Warm and dry, no rash. Neuro: Strength intact.  Assessment & Plan   Atypical chest pain Nuclear stress test July 2021 was a low risk study without significant ischemia or scar.  Patient reports continued chest discomfort unchanged from previous. She will get sternal swelling and area will be tender to palpation. No other associated symptoms. EKG shows T-wave abnormalities stable unchanged from prior EKGs.  -No ischemic testing indicated at this time.   Aortic dilatation Echo September 2022 showed mild dilatation of aortic root 42 mm, mild dilatation of ascending aorta 40 mm.  Patient denies abdominal pain, back pain, dyspnea. Patient recently found out she has Beals Syndrome which is a hereditary connective tissue disorder.  -CTA aorta for surveillance.  Palpitations Patient denies palpitations. EKG shows NSR today. RRR on exam.  -Continue Toprol.  Hypertension BP today 110/70. She reports occasional positional dizziness.  -Continue Toprol.  Dizziness/Blurred vision Patient reports occasional positional dizziness. This typically occurs with sit to stand. She also reports transient blurred vision out of right eye. She does wear glasses. She has a history of trauma to her neck >15 years ago and was told she has a "leak" in her right  carotid. She does get occasional right sided anterior neck discomfort with and without blurred vision.  -Carotid duplex for further evaluation.   Disposition: CTA aorta, carotid duplex. Return in 1 year or sooner as needed.          Signed, Etta Grandchild. Dempsey Knotek, DNP, NP-C

## 2024-01-11 ENCOUNTER — Encounter: Payer: Self-pay | Admitting: Family Medicine

## 2024-01-17 ENCOUNTER — Ambulatory Visit: Payer: 59 | Attending: Student

## 2024-01-17 DIAGNOSIS — H538 Other visual disturbances: Secondary | ICD-10-CM | POA: Diagnosis not present

## 2024-01-17 DIAGNOSIS — R42 Dizziness and giddiness: Secondary | ICD-10-CM

## 2024-01-25 ENCOUNTER — Other Ambulatory Visit: Payer: Self-pay | Admitting: Family Medicine

## 2024-01-25 DIAGNOSIS — E118 Type 2 diabetes mellitus with unspecified complications: Secondary | ICD-10-CM

## 2024-01-26 ENCOUNTER — Ambulatory Visit
Admission: RE | Admit: 2024-01-26 | Discharge: 2024-01-26 | Disposition: A | Discharge status: Home / Self Care | Payer: 59 | Source: Ambulatory Visit | Attending: Student | Admitting: Student

## 2024-01-26 DIAGNOSIS — I77819 Aortic ectasia, unspecified site: Secondary | ICD-10-CM | POA: Diagnosis not present

## 2024-01-26 DIAGNOSIS — E049 Nontoxic goiter, unspecified: Secondary | ICD-10-CM | POA: Diagnosis not present

## 2024-01-26 DIAGNOSIS — K760 Fatty (change of) liver, not elsewhere classified: Secondary | ICD-10-CM | POA: Diagnosis not present

## 2024-01-26 MED ORDER — IOHEXOL 350 MG/ML SOLN
75.0000 mL | Freq: Once | INTRAVENOUS | Status: AC | PRN
  Administered 2024-01-26: 75 mL via INTRAVENOUS

## 2024-01-31 ENCOUNTER — Other Ambulatory Visit: Payer: Self-pay | Admitting: Family Medicine

## 2024-01-31 DIAGNOSIS — E118 Type 2 diabetes mellitus with unspecified complications: Secondary | ICD-10-CM

## 2024-02-02 ENCOUNTER — Telehealth: Payer: Self-pay

## 2024-02-02 MED ORDER — ROSUVASTATIN CALCIUM 5 MG PO TABS
5.0000 mg | ORAL_TABLET | Freq: Every day | ORAL | 3 refills | Status: DC
Start: 2024-02-02 — End: 2024-09-25

## 2024-02-02 NOTE — Telephone Encounter (Signed)
 Meds ordered this encounter  Medications   rosuvastatin (CRESTOR) 5 MG tablet    Sig: Take 1 tablet (5 mg total) by mouth daily.    Dispense:  90 tablet    Refill:  3    Supervising Provider:   Sandre Kitty [[id number]]

## 2024-02-02 NOTE — Addendum Note (Signed)
 Addended by: Saralyn Pilar on: 02/02/2024 12:25 PM   Modules accepted: Orders

## 2024-02-02 NOTE — Telephone Encounter (Signed)
 Copied from CRM [id number]. Topic: General - Phone/Fax/Address >> Feb 02, 2024 10:21 AM Patsy Lager T wrote: Patient/patient representative is calling for clinic's phone, fax, or address information. Victorino Dike patient advocate for [facility] [phone] called stated patient may possibly have diabetes but may not be on a statin per the ADA guideline ACCAHA cholesterol guideline recommendation. Consider reevaluating therapy for patient and prescribe a statin if clinically appropriate and if appropriate send script to the patients pharmacy asap so they can close the open care opportunity before the end of year. If member does not tolerate or meet exclusion criteria for statin,  please document the appropriate ICD-10 diagnosis code in record.

## 2024-02-09 ENCOUNTER — Encounter: Payer: Self-pay | Admitting: Obstetrics & Gynecology

## 2024-02-19 ENCOUNTER — Ambulatory Visit: Payer: Self-pay | Admitting: Family Medicine

## 2024-02-19 NOTE — Telephone Encounter (Signed)
 Copied from CRM [id number]. Topic: Clinical - Red Word Triage >> Feb 19, 2024  8:42 AM [place] P wrote: [facility] that prompted transfer to Nurse Triage: patient has bright red blood in stool, looking to see pcp  Chief Complaint: bright red blood in stool Symptoms: see above Frequency: constant Pertinent Negatives: Patient denies cp, sob, vomiting, diarrhea,  Disposition: [] ED /[x] Urgent Care (no appt availability in office) / [] Appointment(In office/virtual)/ []  [place] [facility]/ [] Home Care/ [] Refused Recommended Disposition /[] [place] [facility]/ []  Follow-up with PCP Additional Notes: no apt available for today; instructed to go to UC; care advice given, denies questions; instructed to go to ER if becomes worse.   Reason for Disposition  Unable to have a bowel movement (BM) without laxative or enema  Answer Assessment - Initial Assessment Questions 1. STOOL PATTERN OR FREQUENCY: "How often do you have a bowel movement (BM)?"  (Normal range: 3 times a day to every 3 days)  "When was your last BM?"       Twice to three times a day; 10 min ago with bright red blood,  2. STRAINING: "Do you have to strain to have a BM?"      "Sort of" 3. RECTAL PAIN: "Does your rectum hurt when the stool comes out?" If Yes, ask: "Do you have hemorrhoids? How bad is the pain?"  (Scale 1-10; or mild, moderate, severe)     Yes, denies hemorrhoids 4. STOOL COMPOSITION: "Are the stools hard?"      yes 5. BLOOD ON STOOLS: "Has there been any blood on the toilet tissue or on the surface of the BM?" If Yes, ask: "When was the last time?"     Yes, bright red 6. CHRONIC CONSTIPATION: "Is this a new problem for you?"  If No, ask: "How long have you had this problem?" (days, weeks, months)      States sometimes has a hard time going 7. CHANGES IN DIET OR HYDRATION: "Have there been any recent changes in your diet?" "How much fluids are you drinking on a daily basis?"  "How much have you had to drink today?"      denies 8. MEDICINES: "Have you been taking any new medicines?" "Are you taking any narcotic pain medicines?" (e.g., Dilaudid, morphine, Percocet, Vicodin)     denies 9. LAXATIVES: "Have you been using any stool softeners, laxatives, or enemas?"  If Yes, ask "What, how often, and when was the last time?"     denies 10. ACTIVITY:  "How much walking do you do every day?"  "Has your activity level decreased in the past week?"        denies 11. CAUSE: "What do you think is causing the constipation?"        Unknown  12. OTHER SYMPTOMS: "Do you have any other symptoms?" (e.g., abdomen pain, bloating, fever, vomiting)       Abd pain, bloating 13. MEDICAL HISTORY: "Do you have a history of hemorrhoids, rectal fissures, or rectal surgery or rectal abscess?"         denies 14. PREGNANCY: "Is there any chance you are pregnant?" "When was your last menstrual period?"       na  Protocols used: Constipation-A-AH

## 2024-03-13 ENCOUNTER — Ambulatory Visit: Payer: Self-pay

## 2024-03-13 NOTE — Telephone Encounter (Signed)
 Chief Complaint: Poss medication SE Symptoms: dizziness, left jaw pain, chest heaviness/tightness Frequency: x 2 days Pertinent Negatives: Patient denies fever, N/V, SOB Disposition: [x] ED /[] Urgent Care (no appt availability in office) / [] Appointment(In office/virtual)/ []  [place] [facility]/ [] Home Care/ [] Refused Recommended Disposition /[] [place] [facility]/ []  Follow-up with PCP Additional Notes: Pt reports she has been experiencing "all over" body muscle spasms including chest tightness that feels "like an elephant is sitting on her chest". Notes left jaw pain, increasing duration of CP since it began. Advised pt to proceed to ED. This RN educated pt on home care, new-worsening symptoms, when to call back/seek emergent care. Pt verbalized understanding and agrees to plan.    Copied from CRM [id number]. Topic: Clinical - Red Word Triage >> Mar 13, 2024  4:12 PM [place] P wrote: [facility] that prompted transfer to Nurse Triage: rosuvastatin (CRESTOR) 5 MG tablet - side effects, tight constricted Reason for Disposition  [1] Chest pain lasts > 5 minutes AND [2] occurred in past 3 days (72 hours) (Exception: Feels exactly the same as previously diagnosed heartburn and has accompanying sour taste in mouth.)  Answer Assessment - Initial Assessment Questions 1. NAME of MEDICINE: "What medicine(s) are you calling about?"     Rosuvastatin 2. QUESTION: "What is your question?" (e.g., double dose of medicine, side effect)     Muscle tightening 3. PRESCRIBER: "Who prescribed the medicine?" Reason: if prescribed by specialist, call should be referred to that group.     Saralyn Pilar 4. SYMPTOMS: "Do you have any symptoms?" If Yes, ask: "What symptoms are you having?"  "How bad are the symptoms (e.g., mild, moderate, severe)     Pain 7/10  Answer Assessment - Initial Assessment Questions 1. LOCATION: "Where does it hurt?"       Chest tightness, heaviness 2. RADIATION: "Does the pain go  anywhere else?" (e.g., into neck, jaw, arms, back)     Left jaw 3. ONSET: "When did the chest pain begin?" (Minutes, hours or days)      X 2 days 4. PATTERN: "Does the pain come and go, or has it been constant since it started?"  "Does it get worse with exertion?"      Intermittent 5. DURATION: "How long does it last" (e.g., seconds, minutes, hours)     Most of the time a few minutes, few hours 6. SEVERITY: "How bad is the pain?"  (e.g., Scale 1-10; mild, moderate, or severe)    - MILD (1-3): doesn't interfere with normal activities     - MODERATE (4-7): interferes with normal activities or awakens from sleep    - SEVERE (8-10): excruciating pain, unable to do any normal activities       5/10 7. CARDIAC RISK FACTORS: "Do you have any history of heart problems or risk factors for heart disease?" (e.g., angina, prior heart attack; diabetes, high blood pressure, high cholesterol, smoker, or strong family history of heart disease)     None 8. PULMONARY RISK FACTORS: "Do you have any history of lung disease?"  (e.g., blood clots in lung, asthma, emphysema, birth control pills)     None 9. CAUSE: "What do you think is causing the chest pain?"     Unknown 10. OTHER SYMPTOMS: "Do you have any other symptoms?" (e.g., dizziness, nausea, vomiting, sweating, fever, difficulty breathing, cough)       Dizziness  Protocols used: Medication Question Call-A-AH, Chest Pain-A-AH

## 2024-03-19 DIAGNOSIS — N3001 Acute cystitis with hematuria: Secondary | ICD-10-CM | POA: Diagnosis not present

## 2024-04-05 ENCOUNTER — Other Ambulatory Visit: Payer: Self-pay | Admitting: Family Medicine

## 2024-04-05 DIAGNOSIS — E118 Type 2 diabetes mellitus with unspecified complications: Secondary | ICD-10-CM

## 2024-04-05 DIAGNOSIS — R350 Frequency of micturition: Secondary | ICD-10-CM | POA: Diagnosis not present

## 2024-04-17 ENCOUNTER — Other Ambulatory Visit: Payer: 59

## 2024-04-19 ENCOUNTER — Other Ambulatory Visit

## 2024-04-24 ENCOUNTER — Ambulatory Visit: Payer: 59 | Admitting: Family Medicine

## 2024-05-02 ENCOUNTER — Ambulatory Visit: Payer: 59

## 2024-05-02 DIAGNOSIS — Z0000 Encounter for general adult medical examination without abnormal findings: Secondary | ICD-10-CM | POA: Diagnosis not present

## 2024-05-02 NOTE — Progress Notes (Signed)
 Subjective:   Samantha [name] is a 43 y.o. who presents for a Medicare Wellness preventive visit.  As a reminder, Annual Wellness Visits don't include a physical exam, and some assessments may be limited, especially if this visit is performed virtually. We may recommend an in-person follow-up visit with your provider if needed.  Visit Complete: Virtual I connected with  Marien Short on 05/02/24 by a audio enabled telemedicine application and verified that I am speaking with the correct person using two identifiers.  Patient Location: Home  Provider Location: Home Office  I discussed the limitations of evaluation and management by telemedicine. The patient expressed understanding and agreed to proceed.  Vital Signs: Because this visit was a virtual/telehealth visit, some criteria may be missing or patient reported. Any vitals not documented were not able to be obtained and vitals that have been documented are patient reported.  VideoError- Librarian, academic were attempted between this provider and patient, however failed, due to patient having technical difficulties OR patient did not have access to video capability.  We continued and completed visit with audio only.   Persons Participating in Visit: Patient.  AWV Questionnaire: No: Patient Medicare AWV questionnaire was not completed prior to this visit.  Cardiac Risk Factors include: advanced age (>16men, >78 women);diabetes mellitus;dyslipidemia;hypertension     Objective:     Today's Vitals   05/02/24 1126  PainSc: 6    There is no height or weight on file to calculate BMI.     05/02/2024   11:36 AM 04/13/2023   10:32 AM 02/22/2023    8:15 AM 01/20/2023    7:51 PM 04/26/2022    2:23 PM 02/22/2022   12:21 PM 07/30/2021    7:26 AM  Advanced Directives  Does Patient Have a Medical Advance Directive? No No No No No No No  Would patient like information on creating a medical advance directive? No -  Patient declined No - Patient declined No - Patient declined  Yes (MAU/Ambulatory/Procedural Areas - Information given) No - Patient declined No - Patient declined    Current Medications (verified) Outpatient Encounter Medications as of 05/02/2024  Medication Sig   ACCU-CHEK GUIDE TEST test strip USE TO CHECK BLOOD SUGAR IN THE MORNING, AT NOON, AND AT BEDTIME.   Blood Glucose Monitoring Suppl DEVI 1 each by Does not apply route in the morning, at noon, and at bedtime. May substitute to any manufacturer covered by patient's insurance.   cetirizine (ZYRTEC) 10 MG tablet Take 10 mg by mouth daily.   fenofibrate  54 MG tablet Take 1 tablet (54 mg total) by mouth daily.   FLUoxetine  (PROZAC ) 40 MG capsule Take 1 capsule (40 mg total) by mouth daily.   gabapentin  (NEURONTIN ) 300 MG capsule Take 1 capsule (300 mg total) by mouth at bedtime.   MOUNJARO  7.5 MG/0.5ML Pen INJECT 7.5 MG INTO THE SKIN ONCE A WEEK.   Rimegepant Sulfate (NURTEC) 75 MG TBDP Take 1 tablet (75 mg total) by mouth every other day.   valACYclovir  (VALTREX ) 500 MG tablet Take 1 tablet (500 mg total) by mouth daily as needed (cold sores).   metoprolol  succinate (TOPROL -XL) 25 MG 24 hr tablet Take 0.5 tablets (12.5 mg total) by mouth daily. PLEASE KEEP APPT SCHEDULED 01-01-24 FOR FUTURE REFILLS. (FIRST ATTEMPT) (Patient not taking: Reported on 05/02/2024)   rosuvastatin  (CRESTOR ) 5 MG tablet Take 1 tablet (5 mg total) by mouth daily. (Patient not taking: Reported on 05/02/2024)   SUMAtriptan  (IMITREX )  50 MG tablet Take 1 tablet (50 mg total) by mouth once as needed for up to 1 dose for migraine. May repeat in 2 hours if headache persists or recurs. (Patient not taking: Reported on 05/02/2024)   No facility-administered encounter medications on file as of 05/02/2024.    Allergies (verified) Sulfa antibiotics, Topiramate, Duloxetine hcl, Ibuprofen , Metformin  and related, and Penicillins   History: Past Medical History:  Diagnosis Date    Anemia    Angio-edema    Anxiety    Chest pain    a. 06/2020 MV: EF 55%, no ischemia or infarct.  No significant coronary calcium  on attenuation correction CT.   Complication of anesthesia    slow to wake up 07/26/2021   Concussion with no loss of consciousness 03/01/2023   COVID    sept 2021 flu like symptoms and cough.   lasted 5 days. 07/26/2021   Depression    rx presc but not taken   Diabetes mellitus without complication (HCC)    Dilated aortic root (HCC)    a. 05/2020 Echo: Ao root 41mm; b. 08/2021 Echo: Ao root 42mm, Asc Ao 40mm.   Fatty liver    Fibromyalgia    GERD (gastroesophageal reflux disease)    no meds   H/o Sinus infection    Headache(784.0)    tension and with anxiety hx. complicated migraine 07/26/2021   Hepatic steatosis    History of kidney stones    MVP (mitral valve prolapse)    a. Pt told MVP present as child; b. 05/2020 Echo: EF 55-60%, no rwma, nl RV size/fxn, triv MR, mild AI. Ao root 41mm; c. 05/2021 Echo: EF 60-65%, no rwma, GrI DD, nl RV fxn, mildly dil LA, no MR, Ao root 42mm, Asc AO 40mm.   Ovarian cyst    Pneumonia    hx   Seizures (HCC)    a. non epileptic events per epilepsy monitoring unit; b. last seizure July 2015- does not convulse but has a sleep effect. pt states she doesnt have seizures, she has complicated migraines and causes her to have sleep like effective per pt. pt goes to [facility] and use to see Dr. Starlin Echevaria but she left the practiced and doesnt have new dr. 07/27/2021   Sleep apnea    has cpap does not use   Wears glasses    07/26/2021   Past Surgical History:  Procedure Laterality Date   ABDOMINAL HYSTERECTOMY N/A 08/15/2014   Procedure: HYSTERECTOMY ABDOMINAL WITH CYSTOSCOPY;  Surgeon: Artemisa Bile, MD;  Location: [facility];  Service: Gynecology;  Laterality: N/A;   BLADDER SUSPENSION N/A 07/30/2021   Procedure: TRANSVAGINAL TAPE (TVT) PROCEDURE;  Surgeon: Artemisa Bile, MD;  Location: [facility];  Service:  Gynecology;  Laterality: N/A;   CESAREAN SECTION N/A    2010 07/26/2021   CYSTOSCOPY N/A 07/30/2021   Procedure: CYSTOSCOPY;  Surgeon: Artemisa Bile, MD;  Location: [facility];  Service: Gynecology;  Laterality: N/A;   CYSTOSCOPY/RETROGRADE/URETEROSCOPY Left 05/23/2019   Procedure: CYSTOSCOPY/RETROGRADE/URETEROSCOPY;  Surgeon: Geraline Knapp, MD;  Location: [facility];  Service: Urology;  Laterality: Left;   KNEE ARTHROSCOPY     yr 2020 07/26/2021   LAPAROSCOPIC LYSIS OF ADHESIONS N/A 02/22/2022   Procedure: LAPAROSCOPIC LYSIS OF ADHESIONS;  Surgeon: Pinn, Walda, MD;  Location: [facility];  Service: Gynecology;  Laterality: N/A;   LAPAROSCOPY Left 02/22/2022   Procedure: LAPAROSCOPY DIAGNOSTIC and Pelvic Washings;  Surgeon: Artemisa Bile, MD;  Location: [facility];  Service:  Gynecology;  Laterality: Left;   SHOULDER ARTHROSCOPY WITH SUBACROMIAL DECOMPRESSION AND OPEN ROTATOR C Left 11/27/2015   Procedure: LEFT SHOULDER ARTHROSCOPY WITH SUBACROMIAL DECOMPRESSION, MINI OPEN ROTATOR CUFF REPAIR;  Surgeon: Winston Hawking, MD;  Location: [facility];  Service: Orthopedics;  Laterality: Left;   TOTAL ABDOMINAL HYSTERECTOMY  08/15/2014   TUBAL LIGATION     Family History  Problem Relation Age of Onset   Diabetes Mother    Hypertension Mother    Healthy Father    Breast cancer Maternal Grandmother    Cancer Maternal Grandfather        brain, lung, liver   Other Paternal Grandmother    Heart attack Paternal Grandfather    Breast cancer Maternal Great-grandmother    Colon cancer Neg Hx    Ovarian cancer Neg Hx    Endometrial cancer Neg Hx    Pancreatic cancer Neg Hx    Prostate cancer Neg Hx    Social History   Socioeconomic History   Marital status: Married    Spouse name: Not on file   Number of children: Not on file   Years of education: Not on file   Highest education level: Not on file  Occupational History   Occupation: works a Archivist  Tobacco Use   Smoking  status: Never    Passive exposure: Never   Smokeless tobacco: Never  Vaping Use   Vaping status: Never Used  Substance and Sexual Activity   Alcohol use: No   Drug use: No   Sexual activity: Yes    Birth control/protection: Surgical  Other Topics Concern   Not on file  Social History Narrative   Not on file   Social Drivers of Health   Financial Resource Strain: Low Risk  (05/02/2024)   Overall Financial Resource Strain (CARDIA)    Difficulty of Paying Living Expenses: Not hard at all  Food Insecurity: No Food Insecurity (05/02/2024)   Hunger Vital Sign    Worried About Running Out of Food in the Last Year: Never true    Ran Out of Food in the Last Year: Never true  Transportation Needs: No Transportation Needs (05/02/2024)   PRAPARE - Administrator, Civil Service (Medical): No    Lack of Transportation (Non-Medical): No  Physical Activity: Inactive (05/02/2024)   Exercise Vital Sign    Days of Exercise per Week: 0 days    Minutes of Exercise per Session: 0 min  Stress: Stress Concern Present (05/02/2024)   [employer] of Occupational Health - Occupational Stress Questionnaire    Feeling of Stress : To some extent  Social Connections: Socially Integrated (05/02/2024)   Social Connection and Isolation Panel [NHANES]    Frequency of Communication with Friends and Family: More than three times a week    Frequency of Social Gatherings with Friends and Family: Once a week    Attends Religious Services: More than 4 times per year    Active Member of [employer] or Organizations: Yes    Attends Engineer, structural: More than 4 times per year    Marital Status: Married    Tobacco Counseling Counseling given: Not Answered    Clinical Intake:  Pre-visit preparation completed: Yes  Pain : 0-10 Pain Score: 6  Pain Type: Acute pain Pain Location: Back Pain Descriptors / Indicators: Aching Pain Onset: More than a month ago Pain Frequency: Constant      Nutritional Risks: None Diabetes: Yes CBG done?: No Did pt. bring  in CBG monitor from home?: No  Lab Results  Component Value Date   HGBA1C 6.4 (H) 12/22/2023   HGBA1C 7.7 (H) 09/14/2023   HGBA1C 8.8 06/20/2023     How often do you need to have someone help you when you read instructions, pamphlets, or other written materials from your doctor or pharmacy?: 1 - Never  Interpreter Needed?: No  Information entered by :: NAllen LPN   Activities of Daily Living     05/02/2024   11:28 AM  In your present state of health, do you have any difficulty performing the following activities:  Hearing? 0  Vision? 0  Difficulty concentrating or making decisions? 0  Walking or climbing stairs? 0  Dressing or bathing? 0  Doing errands, shopping? 0  Preparing Food and eating ? N  Using the Toilet? N  In the past six months, have you accidently leaked urine? Y  Do you have problems with loss of bowel control? N  Managing your Medications? N  Managing your Finances? N  Housekeeping or managing your Housekeeping? N    Patient Care Team: Melene Sportsman as PCP - General (Physician Assistant) End, Veryl Gottron, MD as PCP - Cardiology (Cardiology) Stafford Eagles, MD as Attending Physician (Family Medicine) Alvis Jourdain, MD as Consulting Physician (Gastroenterology) Phebe Brasil, MD as Consulting Physician (Neurology) Arlee Bellows, NP as Nurse Practitioner (Gastroenterology) Specialists, [facility] (Orthopedic Surgery)  Indicate any recent Medical Services you may have received from other than [facility] providers in the past year (date may be approximate).     Assessment:    This is a routine wellness examination for Vick[place].  Hearing/Vision screen Hearing Screening - Comments:: Denies hearing issues Vision Screening - Comments:: Regular eye exams, [facility]   Goals Addressed             This Visit's Progress    Patient Stated       05/02/2024, continue  to lose weight       Depression Screen     05/02/2024   11:38 AM 12/26/2023    3:53 PM 09/20/2023    2:29 PM 06/20/2023    2:50 PM 04/13/2023   10:29 AM 03/20/2023    2:20 PM 09/27/2022    3:56 PM  PHQ 2/9 Scores  PHQ - 2 Score 0 0 0 0 0 0 0  PHQ- 9 Score 2 1 1 3   2     Fall Risk     05/02/2024   11:38 AM 12/26/2023    3:50 PM 04/13/2023   10:31 AM 03/20/2023    2:20 PM 09/27/2022    3:56 PM  Fall Risk   Falls in the past year? 1 1 0 0 0  Comment tripped      Number falls in past yr: 0 0 0 0 0  Injury with Fall? 0 0 0 0 0  Risk for fall due to : Medication side effect No Fall Risks No Fall Risks No Fall Risks No Fall Risks  Follow up Falls prevention discussed;Falls evaluation completed Falls evaluation completed Falls prevention discussed  Falls evaluation completed    MEDICARE RISK AT HOME:  Medicare Risk at Home Any stairs in or around the home?: Yes (ramp) If so, are there any without handrails?: No Home free of loose throw rugs in walkways, pet beds, electrical cords, etc?: Yes Adequate lighting in your home to reduce risk of falls?: Yes Life alert?: No Use of a cane, walker  or w/c?: No Grab bars in the bathroom?: Yes Shower chair or bench in shower?: Yes Elevated toilet seat or a handicapped toilet?: Yes  TIMED UP AND GO:  Was the test performed?  No  Cognitive Function: 6CIT completed        05/02/2024   11:40 AM 04/13/2023   10:32 AM  6CIT Screen  What Year? 0 points 0 points  What month? 0 points 0 points  What time? 3 points 0 points  Count back from 20 0 points 0 points  Months in reverse 0 points 0 points  Repeat phrase 2 points 0 points  Total Score 5 points 0 points    Immunizations Immunization History  Administered Date(s) Administered   Influenza Split 06/05/2011   Influenza, Seasonal, Injecte, Preservative Fre 09/14/2015, 09/20/2023   Influenza,inj,Quad PF,6+ Mos 08/16/2014, 09/15/2016, 09/25/2019, 09/23/2020   Influenza-Unspecified  09/05/2015, 09/15/2016, 09/04/2017   PNEUMOCOCCAL CONJUGATE-20 12/26/2023   Pneumococcal Polysaccharide-23 09/14/2015   Tdap 02/17/2014, 11/02/2021    Screening Tests Health Maintenance  Topic Date Due   COVID-19 Vaccine (1 - 2024-25 season) Never done   Diabetic kidney evaluation - Urine ACR  03/19/2024   OPHTHALMOLOGY EXAM  04/09/2024   HEMOGLOBIN A1C  06/20/2024   INFLUENZA VACCINE  07/05/2024   FOOT EXAM  09/19/2024   Diabetic kidney evaluation - eGFR measurement  12/21/2024   Medicare Annual Wellness (AWV)  05/02/2025   DTaP/Tdap/Td (3 - Td or Tdap) 11/03/2031   Pneumococcal Vaccine 104-66 Years old  Completed   Hepatitis C Screening  Completed   HIV Screening  Completed   HPV VACCINES  Aged Out   Meningococcal B Vaccine  Aged Out    Health Maintenance  Health Maintenance Due  Topic Date Due   COVID-19 Vaccine (1 - 2024-25 season) Never done   Diabetic kidney evaluation - Urine ACR  03/19/2024   OPHTHALMOLOGY EXAM  04/09/2024   Health Maintenance Items Addressed: Declines covid vaccine. Patient will schedule eye exam. Urine will be addressed on next appointment.  Additional Screening:  Vision Screening: Recommended annual ophthalmology exams for early detection of glaucoma and other disorders of the eye.  Dental Screening: Recommended annual dental exams for proper oral hygiene  Community Resource Referral / Chronic Care Management: CRR required this visit?  No   CCM required this visit?  No   Plan:    I have personally reviewed and noted the following in the patient's chart:   Medical and social history Use of alcohol, tobacco or illicit drugs  Current medications and supplements including opioid prescriptions. Patient is not currently taking opioid prescriptions. Functional ability and status Nutritional status Physical activity Advanced directives List of other physicians Hospitalizations, surgeries, and ER visits in previous 12  months Vitals Screenings to include cognitive, depression, and falls Referrals and appointments  In addition, I have reviewed and discussed with patient certain preventive protocols, quality metrics, and best practice recommendations. A written personalized care plan for preventive services as well as general preventive health recommendations were provided to patient.   Areatha Beecham, LPN   [id number]   After Visit Summary: (MyChart) Due to this being a telephonic visit, the after visit summary with patients personalized plan was offered to patient via MyChart   Notes: Nothing significant to report at this time.

## 2024-05-02 NOTE — Patient Instructions (Signed)
 Samantha [name] , Thank you for taking time out of your busy schedule to complete your Annual Wellness Visit with me. I enjoyed our conversation and look forward to speaking with you again next year. I, as well as your care team,  appreciate your ongoing commitment to your health goals. Please review the following plan we discussed and let me know if I can assist you in the future. Your Game plan/ To Do List    Referrals: If you haven't heard from the office you've been referred to, please reach out to them at the phone provided.  N/a Follow up Visits: Next Medicare AWV with our clinical staff: 05/15/2025 at 3:00   Have you seen your provider in the last 6 months (3 months if uncontrolled diabetes)? Yes Next Office Visit with your provider: 05/23/2024 at 2:30  Clinician Recommendations:  Aim for 30 minutes of exercise or brisk walking, 6-8 glasses of water , and 5 servings of fruits and vegetables each day. Don't forget to schedule eye exam.      This is a list of the screening recommended for you and due dates:  Health Maintenance  Topic Date Due   COVID-19 Vaccine (1 - 2024-25 season) Never done   Yearly kidney health urinalysis for diabetes  03/19/2024   Eye exam for diabetics  04/09/2024   Hemoglobin A1C  06/20/2024   Flu Shot  07/05/2024   Complete foot exam   09/19/2024   Yearly kidney function blood test for diabetes  12/21/2024   Medicare Annual Wellness Visit  05/02/2025   DTaP/Tdap/Td vaccine (3 - Td or Tdap) 11/03/2031   Pneumococcal Vaccination  Completed   Hepatitis C Screening  Completed   HIV Screening  Completed   HPV Vaccine  Aged Out   Meningitis B Vaccine  Aged Out    Advanced directives: (ACP Link)Information on Advanced Care Planning can be found at [place]  Secretary of [facility] Advance Health Care Directives Advance Health Care Directives. [email or web address]  Advance Care Planning is important because it:  [x]  Makes sure you receive the medical care that is consistent with  your values, goals, and preferences  [x]  It provides guidance to your family and loved ones and reduces their decisional burden about whether or not they are making the right decisions based on your wishes.  Follow the link provided in your after visit summary or read over the paperwork we have mailed to you to help you started getting your Advance Directives in place. If you need assistance in completing these, please reach out to us  so that we can help you!  See attachments for Preventive Care and Fall Prevention Tips.

## 2024-05-15 ENCOUNTER — Other Ambulatory Visit: Payer: Self-pay | Admitting: *Deleted

## 2024-05-15 DIAGNOSIS — E118 Type 2 diabetes mellitus with unspecified complications: Secondary | ICD-10-CM

## 2024-05-15 DIAGNOSIS — I10 Essential (primary) hypertension: Secondary | ICD-10-CM

## 2024-05-15 DIAGNOSIS — E1169 Type 2 diabetes mellitus with other specified complication: Secondary | ICD-10-CM

## 2024-05-15 DIAGNOSIS — E559 Vitamin D deficiency, unspecified: Secondary | ICD-10-CM

## 2024-05-15 DIAGNOSIS — Z1329 Encounter for screening for other suspected endocrine disorder: Secondary | ICD-10-CM

## 2024-05-16 ENCOUNTER — Other Ambulatory Visit: Payer: Self-pay | Admitting: *Deleted

## 2024-05-17 ENCOUNTER — Other Ambulatory Visit

## 2024-05-17 DIAGNOSIS — E118 Type 2 diabetes mellitus with unspecified complications: Secondary | ICD-10-CM

## 2024-05-17 DIAGNOSIS — I10 Essential (primary) hypertension: Secondary | ICD-10-CM

## 2024-05-17 DIAGNOSIS — Z1329 Encounter for screening for other suspected endocrine disorder: Secondary | ICD-10-CM

## 2024-05-17 DIAGNOSIS — E559 Vitamin D deficiency, unspecified: Secondary | ICD-10-CM

## 2024-05-17 DIAGNOSIS — E1169 Type 2 diabetes mellitus with other specified complication: Secondary | ICD-10-CM

## 2024-05-23 ENCOUNTER — Ambulatory Visit

## 2024-05-24 ENCOUNTER — Ambulatory Visit: Admitting: Family Medicine

## 2024-06-25 ENCOUNTER — Other Ambulatory Visit: Payer: Self-pay | Admitting: Family Medicine

## 2024-06-25 DIAGNOSIS — E118 Type 2 diabetes mellitus with unspecified complications: Secondary | ICD-10-CM

## 2024-08-06 ENCOUNTER — Other Ambulatory Visit

## 2024-08-12 ENCOUNTER — Ambulatory Visit

## 2024-09-25 ENCOUNTER — Ambulatory Visit: Payer: Self-pay

## 2024-09-25 ENCOUNTER — Ambulatory Visit (INDEPENDENT_AMBULATORY_CARE_PROVIDER_SITE_OTHER)

## 2024-09-25 VITALS — Ht 69.0 in

## 2024-09-25 DIAGNOSIS — F411 Generalized anxiety disorder: Secondary | ICD-10-CM

## 2024-09-25 DIAGNOSIS — E1169 Type 2 diabetes mellitus with other specified complication: Secondary | ICD-10-CM | POA: Diagnosis not present

## 2024-09-25 DIAGNOSIS — E118 Type 2 diabetes mellitus with unspecified complications: Secondary | ICD-10-CM | POA: Diagnosis not present

## 2024-09-25 DIAGNOSIS — Z7985 Long-term (current) use of injectable non-insulin antidiabetic drugs: Secondary | ICD-10-CM

## 2024-09-25 DIAGNOSIS — G43119 Migraine with aura, intractable, without status migrainosus: Secondary | ICD-10-CM

## 2024-09-25 DIAGNOSIS — E1159 Type 2 diabetes mellitus with other circulatory complications: Secondary | ICD-10-CM | POA: Diagnosis not present

## 2024-09-25 DIAGNOSIS — E785 Hyperlipidemia, unspecified: Secondary | ICD-10-CM

## 2024-09-25 DIAGNOSIS — I152 Hypertension secondary to endocrine disorders: Secondary | ICD-10-CM

## 2024-09-25 MED ORDER — TIRZEPATIDE 10 MG/0.5ML ~~LOC~~ SOAJ: 10.0000 mg | SUBCUTANEOUS | 2 refills | Status: DC

## 2024-09-25 MED ORDER — PREDNISONE 10 MG (21) PO TBPK: ORAL_TABLET | ORAL | 0 refills | Status: DC

## 2024-09-25 MED ORDER — CYCLOBENZAPRINE HCL 10 MG PO TABS: 10.0000 mg | ORAL_TABLET | Freq: Two times a day (BID) | ORAL | 0 refills | Status: AC | PRN

## 2024-09-25 NOTE — Assessment & Plan Note (Signed)
 Chronic migraine unresponsive to multiple treatments. Steroid taper effective in past. Patient has tried and failed amitriptyline , Depakote, Sumatriptan , and Nurtec. - Prescribe prednisone  taper pack. - Prescribe Flexeril  10 mg  - Refer to neurology for further evaluation of complicated migraines - [facility] - Advise monitoring for stroke symptoms. - Recommend urgent care or ER for IV migraine cocktail if needed.

## 2024-09-25 NOTE — Progress Notes (Signed)
 Acute Office Visit  Subjective:     Patient ID: Samantha [name], female    DOB: [birth year], 43 y.o.   MRN: [id number]  Chief Complaint  Patient presents with   Headache    HPI  History of Present Illness   Samantha [name] is a 43 year old female with chronic migraines who presents with a persistent migraine that will not fully resolve over the last 2-3 weeks. She is accompanied by her husband.  Cephalgia (migraine) - Persistent migraine for the past 2-3 weeks - Pain described as a 'vice on my head', primarily affecting the frontal region - Severity requires wearing a hat and glasses for comfort - Temporary relief with sleep; pain returns upon waking - Blurry vision present, but able to drive - No numbness or tingling - History of chronic migraines  Migraine treatment history and response - Current episode minimally responsive to hydrocodone , ibuprofen , Nurtec, cold and hot compresses, sleep, etc. - Temporary relief after Excedrin but did not take anymore of it due to misunderstanding dosing instructions - Previous treatments include sumatriptan , Depakote, and amitriptyline , Nurtec  - Depakote discontinued due to significant weight gain - Amitriptyline  discontinued due to lack of efficacy - Nurtec, amitriptyline , sumatriptan , and gabapentin  discontinued due to ineffectiveness or adverse effects - No prior use of propranolol - History of prednisone  taper for migraines with good effect and is requesting this today    Current medications - Metoprolol  (half a tablet) - Fluoxetine  - Fenofibrate  - Mounjaro  7.5 mg, would like to increase to 10 mg         ROS Per HPI     Objective:    Ht 5' 9 (1.753 m)   LMP 08/15/2014 Comment: partial hysterectomy  BMI 28.74 kg/m    Physical Exam Constitutional:      General: She is not in acute distress.    Appearance: Normal appearance.  Cardiovascular:     Rate and Rhythm: Normal rate and regular rhythm.     Heart sounds:  Normal heart sounds. No murmur heard.    No friction rub. No gallop.  Pulmonary:     Effort: Pulmonary effort is normal. No respiratory distress.     Breath sounds: Normal breath sounds.  Musculoskeletal:        General: No swelling.  Skin:    General: Skin is warm and dry.  Neurological:     General: No focal deficit present.     Mental Status: She is alert.     Cranial Nerves: Cranial nerves 2-12 are intact.  Psychiatric:        Mood and Affect: Mood normal.        Behavior: Behavior normal.        Thought Content: Thought content normal.      No results found for any visits on 09/25/24.      Assessment & Plan:        Type 2 diabetes mellitus with complication, without long-term current use of insulin (HCC) Assessment & Plan: Rechecking A1c with labs. Patient would like to increase from 7.5 mg Mounjaro  to 10 mg. Rx sent to pharmacy  Orders: -     Tirzepatide ; Inject 10 mg into the skin once a week.  Dispense: 6 mL; Refill: 2  Intractable migraine with aura without status migrainosus Assessment & Plan: Chronic migraine unresponsive to multiple treatments. Steroid taper effective in past. Patient has tried and failed amitriptyline , Depakote, Sumatriptan , and Nurtec. - Prescribe prednisone  taper pack. - Prescribe  Flexeril  10 mg  - Refer to neurology for further evaluation of complicated migraines - [facility] - Advise monitoring for stroke symptoms. - Recommend urgent care or ER for IV migraine cocktail if needed.  Orders: -     Ambulatory referral to Neurology  Hyperlipidemia associated with type 2 diabetes mellitus (HCC) Assessment & Plan: Last lipid panel: LDL 100, HDL 47, Trig 158. Patient discontinued her rosuvastatin  due to lower leg cramps. She is continuing with her Fenofibrate  54 mg daily. Mounjaro  may continue to aid with weight loss which may improve lipid panel further. The 10-year ASCVD risk score (Arnett DK, et al., 2019) is: 1.7%   Updating  lipids with labs. Will cont to monitor.    GAD (generalized anxiety disorder) Assessment & Plan: Stable.  Continue Prozac  30 mg daily.  Will continue to monitor.   Hypertension associated with diabetes (HCC) Assessment & Plan: Blood pressure goal of less than 130/80. Encouraged to continue limiting salt and getting exercise is much as she can. Continue metoprolol  25 mg daily. Continue following with cardiology as well.    Other orders -     predniSONE ; Use as directed.  Dispense: 21 each; Refill: 0 -     Cyclobenzaprine  HCl; Take 1 tablet (10 mg total) by mouth 2 (two) times daily as needed for muscle spasms.  Dispense: 10 tablet; Refill: 0           Return in about 3 months (around 12/26/2024) for Med check .  Samantha JULIANNA Sacks, PA-C

## 2024-09-25 NOTE — Assessment & Plan Note (Signed)
Stable.  Continue Prozac 30 mg daily.  Will continue to monitor.

## 2024-09-25 NOTE — Assessment & Plan Note (Signed)
 Rechecking A1c with labs. Patient would like to increase from 7.5 mg Mounjaro  to 10 mg. Rx sent to pharmacy

## 2024-09-25 NOTE — Telephone Encounter (Signed)
 Patient is scheduled to come in today

## 2024-09-25 NOTE — Assessment & Plan Note (Signed)
 Last lipid panel: LDL 100, HDL 47, Trig 158. Patient discontinued her rosuvastatin  due to lower leg cramps. She is continuing with her Fenofibrate  54 mg daily. Mounjaro  may continue to aid with weight loss which may improve lipid panel further. The 10-year ASCVD risk score (Arnett DK, et al., 2019) is: 1.7%   Updating lipids with labs. Will cont to monitor.

## 2024-09-25 NOTE — Telephone Encounter (Signed)
 Patient with hx of headaches calling with concerns for headaches that won't go away. Patient states headache has been going on for 2-3 weeks. Patient was taking Nurtec but stopped taking it due to not helping. Patient is scheduled for an acute visit today at 3:50 PM.  FYI Only or Action Required?: FYI only for provider.  Patient was last seen in primary care on 12/26/2023 by Wallace Joesph LABOR, PA.  Called Nurse Triage reporting Headache.  Symptoms began several weeks ago.  Interventions attempted: Rest, hydration, or home remedies.  Symptoms are: unchanged.  Triage Disposition: See HCP Within 4 Hours (Or PCP Triage)  Patient/caregiver understands and will follow disposition?: Yes  Copied from CRM #[id number]. Topic: Clinical - Red Word Triage >> Sep 25, 2024  2:31 PM Samantha [name] wrote: Red Word that prompted transfer to Nurse Triage: Patient's husband, Samantha [name], is reporting that patient is having severe headaches for the past 2-3 weeks even with taking med, Rimegepant Sulfate (NURTEC) 75 MG TBDP. Reason for Disposition  [1] SEVERE headache (e.g., excruciating) AND [2] not improved after 2 hours of pain medicine  Answer Assessment - Initial Assessment Questions 1. LOCATION: Where does it hurt?      Frontal headache 2. ONSET: When did the headache start? (e.g., minutes, hours, days)      Chronic migraines-current headaches have been going on for 2-3 weeks.  3. PATTERN: Does the pain come and go, or has it been constant since it started?     constant 4. SEVERITY: How bad is the pain? and What does it keep you from doing?  (e.g., Scale 1-10; mild, moderate, or severe)     9 out of 10 5. RECURRENT SYMPTOM: Have you ever had headaches before? If Yes, ask: When was the last time? and What happened that time?      yes 6. CAUSE: What do you think is causing the headache?     unsure 7. MIGRAINE: Have you been diagnosed with migraine headaches? If Yes, ask: Is this  headache similar?      yes 8. HEAD INJURY: Has there been any recent injury to your head?      no 9. OTHER SYMPTOMS: Do you have any other symptoms? (e.g., fever, stiff neck, eye pain, sore throat, cold symptoms)     no  Protocols used: Headache-A-AH

## 2024-09-25 NOTE — Assessment & Plan Note (Signed)
Blood pressure goal of less than 130/80.  Encouraged to continue limiting salt and getting exercise is much as she can.  Continue metoprolol 25 mg daily.  Continue following with cardiology as well.

## 2024-09-25 NOTE — Patient Instructions (Signed)
 VISIT SUMMARY:  You visited us  today due to a persistent migraine that has lasted for the past three to four weeks. We discussed your history of chronic migraines and reviewed your current and past treatments. We also addressed your type 2 diabetes, hypertension, hyperlipidemia, and major depressive disorder.  YOUR PLAN:  CHRONIC MIGRAINE: You have been experiencing a persistent migraine for the past three to four weeks, which has not responded well to your current treatments. -We have prescribed a prednisone  taper pack to help manage your migraine. -We have also prescribed Flexeril , a muscle relaxant, to see if it helps with your symptoms. -You will be referred to a neurologist for further evaluation and management. -Please monitor for any stroke symptoms and seek urgent care or go to the ER if you need an IV migraine cocktail.  TYPE 2 DIABETES MELLITUS: Your type 2 diabetes is currently managed with Mounjaro . -We are increasing your Mounjaro  dose to 10 mg for better control. -Your Mounjaro  prescription has been refilled.  HYPERTENSION: Your hypertension is managed with metoprolol . -Continue taking metoprolol  at your current dose.  HYPERLIPIDEMIA: Your hyperlipidemia is managed with fenofibrate . Rosuvastatin  was discontinued due to leg cramps. -Continue taking fenofibrate . -Discontinue taking rosuvastatin  and we can talk about alternatives at your next appointment.   MAJOR DEPRESSIVE DISORDER: Your major depressive disorder is managed with fluoxetine . -Continue taking fluoxetine  at your current dose.

## 2024-11-04 ENCOUNTER — Telehealth: Payer: Self-pay

## 2024-11-05 ENCOUNTER — Telehealth: Payer: Self-pay

## 2024-11-05 ENCOUNTER — Other Ambulatory Visit

## 2024-11-05 NOTE — Telephone Encounter (Signed)
 Error

## 2024-11-05 NOTE — Telephone Encounter (Signed)
 Lab orders were put in back in June that include an A1c. She will need to reschedule a lab appt

## 2024-11-05 NOTE — Telephone Encounter (Signed)
 Copied from CRM #[id number]. Topic: Clinical - Request for Lab/Test Order >> Nov 04, 2024 11:18 AM Everette C wrote: Reason for CRM: The patient would like to know if they're able to request a lab order for an A1C check and have the results submitted to [facility]. Please contact further if/when possible   The patient will be seen at [facility] by Dr. Patria for their Procedure on 11/08/24

## 2024-11-05 NOTE — Telephone Encounter (Signed)
 Called patient. She came in this morning. Need A1c sent to Dr. Patria.

## 2024-11-06 ENCOUNTER — Ambulatory Visit: Payer: Self-pay

## 2024-11-06 LAB — CBC WITH DIFFERENTIAL/PLATELET
Basophils Absolute: 0 x10E3/uL (ref 0.0–0.2)
Basos: 1 %
EOS (ABSOLUTE): 0.1 x10E3/uL (ref 0.0–0.4)
Eos: 2 %
Hematocrit: 42.5 % (ref 34.0–46.6)
Hemoglobin: 13.7 g/dL (ref 11.1–15.9)
Immature Grans (Abs): 0 x10E3/uL (ref 0.0–0.1)
Immature Granulocytes: 0 %
Lymphocytes Absolute: 2.4 x10E3/uL (ref 0.7–3.1)
Lymphs: 37 %
MCH: 28.3 pg (ref 26.6–33.0)
MCHC: 32.2 g/dL (ref 31.5–35.7)
MCV: 88 fL (ref 79–97)
Monocytes Absolute: 0.5 x10E3/uL (ref 0.1–0.9)
Monocytes: 7 %
Neutrophils Absolute: 3.5 x10E3/uL (ref 1.4–7.0)
Neutrophils: 53 %
Platelets: 193 x10E3/uL (ref 150–450)
RBC: 4.84 x10E6/uL (ref 3.77–5.28)
RDW: 13.1 % (ref 11.7–15.4)
WBC: 6.6 x10E3/uL (ref 3.4–10.8)

## 2024-11-06 LAB — COMPREHENSIVE METABOLIC PANEL WITH GFR
ALT: 12 IU/L (ref 0–32)
AST: 11 IU/L (ref 0–40)
Albumin: 4.2 g/dL (ref 3.9–4.9)
Alkaline Phosphatase: 63 IU/L (ref 41–116)
BUN/Creatinine Ratio: 21 (ref 9–23)
BUN: 12 mg/dL (ref 6–24)
Bilirubin Total: 0.3 mg/dL (ref 0.0–1.2)
CO2: 27 mmol/L (ref 20–29)
Calcium: 9 mg/dL (ref 8.7–10.2)
Chloride: 98 mmol/L (ref 96–106)
Creatinine, Ser: 0.57 mg/dL (ref 0.57–1.00)
Globulin, Total: 2.5 g/dL (ref 1.5–4.5)
Glucose: 84 mg/dL (ref 70–99)
Potassium: 4 mmol/L (ref 3.5–5.2)
Sodium: 141 mmol/L (ref 134–144)
Total Protein: 6.7 g/dL (ref 6.0–8.5)
eGFR: 114 mL/min/1.73 (ref >59)

## 2024-11-06 LAB — VITAMIN D 25 HYDROXY (VIT D DEFICIENCY, FRACTURES): Vit D, 25-Hydroxy: 27.1 ng/mL — ABNORMAL LOW (ref 30.0–100.0)

## 2024-11-06 LAB — TSH: TSH: 1.04 u[IU]/mL (ref 0.450–4.500)

## 2024-11-06 LAB — LIPID PANEL
Chol/HDL Ratio: 3.8 ratio (ref 0.0–4.4)
Cholesterol, Total: 154 mg/dL (ref 100–199)
HDL: 41 mg/dL (ref >39)
LDL Chol Calc (NIH): 79 mg/dL (ref 0–99)
Triglycerides: 199 mg/dL — ABNORMAL HIGH (ref 0–149)
VLDL Cholesterol Cal: 34 mg/dL (ref 5–40)

## 2024-11-06 LAB — HEMOGLOBIN A1C
Est. average glucose Bld gHb Est-mCnc: 123 mg/dL
Hgb A1c MFr Bld: 5.9 % — ABNORMAL HIGH (ref 4.8–5.6)

## 2024-11-06 NOTE — Telephone Encounter (Signed)
Results has been faxed

## 2024-11-15 ENCOUNTER — Other Ambulatory Visit: Payer: Self-pay | Admitting: Family Medicine

## 2024-11-15 DIAGNOSIS — E1169 Type 2 diabetes mellitus with other specified complication: Secondary | ICD-10-CM

## 2024-11-15 DIAGNOSIS — E781 Pure hyperglyceridemia: Secondary | ICD-10-CM

## 2024-11-18 ENCOUNTER — Other Ambulatory Visit: Payer: Self-pay

## 2024-11-18 DIAGNOSIS — E781 Pure hyperglyceridemia: Secondary | ICD-10-CM

## 2024-11-18 DIAGNOSIS — E1169 Type 2 diabetes mellitus with other specified complication: Secondary | ICD-10-CM

## 2024-11-18 MED ORDER — FENOFIBRATE 54 MG PO TABS: 54.0000 mg | ORAL_TABLET | Freq: Every day | ORAL | 1 refills | Status: AC

## 2024-12-06 ENCOUNTER — Encounter: Payer: Self-pay | Admitting: Family Medicine

## 2024-12-06 ENCOUNTER — Ambulatory Visit: Admitting: Family Medicine

## 2024-12-06 VITALS — BP 105/70 | HR 75 | Ht 69.0 in | Wt 170.4 lb

## 2024-12-06 DIAGNOSIS — M26609 Unspecified temporomandibular joint disorder, unspecified side: Secondary | ICD-10-CM | POA: Diagnosis not present

## 2024-12-06 MED ORDER — MELOXICAM 15 MG PO TABS: 15.0000 mg | ORAL_TABLET | Freq: Every day | ORAL | 0 refills | Status: DC

## 2024-12-06 NOTE — Progress Notes (Addendum)
" ° °  Established Patient Office Visit  Subjective   Patient ID: Samantha [name], female    DOB: [birth year]  Age: 44 y.o. MRN: [id number]  Chief Complaint  Patient presents with   Ear Pain     History of Present Illness   Samantha [name] is a 44 year old female who presents with right ear pain.  She has been experiencing left ear pain for approximately one week. The pain is described as a stabbing sensation initially localized in the middle of the ear, which then spread to involve the entire ear and extended downwards. The pain is persistent and occasionally affects her hearing, causing intermittent hearing loss. No significant pressure changes or feeling of fullness in the ear are noted.  She has a history of ear infections and previously used ear drops, which exacerbated the pain, making her ear feel wet. This specific type of ear pain has not been experienced before.  She acknowledges having seasonal allergies and takes an over-the-counter allergy  pill, but does not use nasal sprays. She also reports grinding her teeth at night, which she believes contributes to waking up with headaches.  In terms of family history, her grandmother, mother, and daughter have similar issues with temporomandibular joint (TMJ) dysfunction. She does not currently have a dentist and is not aware of any family members seeking treatment for TMJ dysfunction.  She has previously taken ibuprofen  but stopped due to counter headaches. She no longer takes Elavil , which previously restricted her use of ibuprofen .       The 10-year ASCVD risk score (Arnett DK, et al., 2019) is: 1.5%  Health Maintenance Due  Topic Date Due   COVID-19 Vaccine (1) Never done   Hepatitis B Vaccines 19-59 Average Risk (1 of 3 - 19+ 3-dose series) Never done   HPV VACCINES (1 - Risk 3-dose SCDM series) Never done   Mammogram  Never done   Diabetic kidney evaluation - Urine ACR  03/19/2024   OPHTHALMOLOGY EXAM  04/09/2024   FOOT EXAM   09/19/2024      Objective:     BP 105/70   Pulse 75   Ht 5' 9 (1.753 m)   Wt 170 lb 6.4 oz (77.3 kg)   LMP 08/15/2014 Comment: partial hysterectomy  SpO2 98%   BMI 25.16 kg/m    Physical Exam   Gen: alert, oriented.  HEENT: tenderness to palpation at the left tmj w/  jaw opening. Normal tm b/l.         No results found for any visits on 12/06/24.      Assessment & Plan:   TMJ dysfunction Assessment & Plan: TMJ dysfunction with pain radiating from ear to jaw, likely hereditary. Differential diagnosis includes ear infection, but symptoms suggest TMJ dysfunction. - Recommended OTC mouth guard to prevent clenching and grinding. - Prescribed meloxicam  for two weeks for anti-inflammatory effect. - Referred to dentist for evaluation and potential custom mouth guard fitting. - Consider ENT referral if dental referral does not resolve symptoms.   Orders: -     Ambulatory referral to Dentistry  Other orders -     Meloxicam ; Take 1 tablet (15 mg total) by mouth daily.  Dispense: 15 tablet; Refill: 0          Return if symptoms worsen or fail to improve.    Toribio Samantha Slain, MD  "

## 2024-12-06 NOTE — Patient Instructions (Addendum)
" °  VISIT SUMMARY: Today, you were seen for right ear pain that has been ongoing for about a week. The pain is stabbing and sometimes affects your hearing. You have a history of ear infections and seasonal allergies, and you also grind your teeth at night. Based on your symptoms and family history, the doctor suspects temporomandibular joint (TMJ) dysfunction.  YOUR PLAN: TEMPOROMANDIBULAR JOINT (TMJ) DYSFUNCTION: You have pain radiating from your ear to your jaw, which is likely due to TMJ dysfunction. This condition may be hereditary, as your family members have similar issues. -Use an over-the-counter mouth guard to prevent clenching and grinding your teeth at night. -Take meloxicam as prescribed for two weeks to reduce inflammation. -Schedule an appointment with a dentist for an evaluation and to discuss the possibility of getting a custom mouth guard. -If your symptoms do not improve after seeing the dentist, we may refer you to an ear, nose, and throat (ENT) specialist.   "

## 2024-12-07 DIAGNOSIS — M26609 Unspecified temporomandibular joint disorder, unspecified side: Secondary | ICD-10-CM | POA: Insufficient documentation

## 2024-12-07 NOTE — Assessment & Plan Note (Signed)
 TMJ dysfunction with pain radiating from ear to jaw, likely hereditary. Differential diagnosis includes ear infection, but symptoms suggest TMJ dysfunction. - Recommended OTC mouth guard to prevent clenching and grinding. - Prescribed meloxicam  for two weeks for anti-inflammatory effect. - Referred to dentist for evaluation and potential custom mouth guard fitting. - Consider ENT referral if dental referral does not resolve symptoms.

## 2024-12-20 ENCOUNTER — Ambulatory Visit: Payer: Self-pay

## 2024-12-20 NOTE — Telephone Encounter (Signed)
 FYI Only or Action Required?: Action required by provider: request for appointment.  Patient was last seen in primary care on 12/06/2024 by Chandra Toribio POUR, MD.  Called Nurse Triage reporting Fatigue.  Symptoms began several months ago.  Interventions attempted: OTC medications: Tylenol  and Ibuprofen .  Symptoms are: gradually worsening.  Triage Disposition: See Physician Within 24 Hours  Patient/caregiver understands and will follow disposition?: Yes  Message from Kevelyn M sent at 12/20/2024 10:22 AM EST  Reason for Triage: Patient calling in for an appointment. She feels she needs her thyroid  checked. Experiencing unbearable cold, not feeling good, and tired for 2 months. No appointments showing until February.  Call back #  [phone]   Reason for Disposition  [1] MODERATE weakness (e.g., interferes with work, school, normal activities) AND [2] persists > 3 days  Answer Assessment - Initial Assessment Questions 1. DESCRIPTION: Describe how you are feeling.     Fatigue, non motivated. States she has to get things done in the morning because by the evening she is too exhausted and unable to complete a task  2. SEVERITY: How bad is it?  Can you stand and walk?     Can still stand/walk  3. ONSET: When did these symptoms begin? (e.g., hours, days, weeks, months)     2 months  4. CAUSE: What do you think is causing the weakness or fatigue? (e.g., not drinking enough fluids, medical problem, trouble sleeping)     Believes thyroid  issues  5. NEW MEDICINES:  Have you started on any new medicines recently? (e.g., opioid pain medicines, benzodiazepines, muscle relaxants, antidepressants, antihistamines, neuroleptics, beta blockers)     Denies  6. OTHER SYMPTOMS: --Pt denies pain, fever, cough, SOB, vomiting,  bleeding Pt c/o constipation with leaky stool        7. PREGNANCY: Is there any chance you are pregnant? When was your last menstrual period?       Denies   Pt reports fatgiue, feeling cold, constipation Pt is taking OTC Tylenol  and Ibuprofen  Earliest appt is in Feb. Pt requesting to have thyroid  levels checked Routing to clinic to assist with scheduling appt for pt Pt agrees with plan of care, will call back for any worsening symptoms  Protocols used: Weakness (Generalized) and Fatigue-A-AH

## 2024-12-26 ENCOUNTER — Ambulatory Visit

## 2024-12-26 ENCOUNTER — Ambulatory Visit: Payer: Self-pay

## 2024-12-26 VITALS — BP 103/69 | HR 74 | Temp 97.9°F | Ht 69.0 in | Wt 169.0 lb

## 2024-12-26 DIAGNOSIS — F39 Unspecified mood [affective] disorder: Secondary | ICD-10-CM | POA: Diagnosis not present

## 2024-12-26 DIAGNOSIS — R209 Unspecified disturbances of skin sensation: Secondary | ICD-10-CM | POA: Diagnosis not present

## 2024-12-26 DIAGNOSIS — E1159 Type 2 diabetes mellitus with other circulatory complications: Secondary | ICD-10-CM | POA: Diagnosis not present

## 2024-12-26 DIAGNOSIS — I152 Hypertension secondary to endocrine disorders: Secondary | ICD-10-CM | POA: Diagnosis not present

## 2024-12-26 DIAGNOSIS — E118 Type 2 diabetes mellitus with unspecified complications: Secondary | ICD-10-CM | POA: Diagnosis not present

## 2024-12-26 DIAGNOSIS — E785 Hyperlipidemia, unspecified: Secondary | ICD-10-CM

## 2024-12-26 DIAGNOSIS — Z23 Encounter for immunization: Secondary | ICD-10-CM

## 2024-12-26 DIAGNOSIS — M26609 Unspecified temporomandibular joint disorder, unspecified side: Secondary | ICD-10-CM | POA: Diagnosis not present

## 2024-12-26 DIAGNOSIS — E1169 Type 2 diabetes mellitus with other specified complication: Secondary | ICD-10-CM

## 2024-12-26 DIAGNOSIS — Z7985 Long-term (current) use of injectable non-insulin antidiabetic drugs: Secondary | ICD-10-CM

## 2024-12-26 DIAGNOSIS — Q040 Congenital malformations of corpus callosum: Secondary | ICD-10-CM

## 2024-12-26 DIAGNOSIS — G40909 Epilepsy, unspecified, not intractable, without status epilepticus: Secondary | ICD-10-CM | POA: Insufficient documentation

## 2024-12-26 DIAGNOSIS — F319 Bipolar disorder, unspecified: Secondary | ICD-10-CM

## 2024-12-26 LAB — POCT UA - MICROALBUMIN
Albumin/Creatinine Ratio, Urine, POC: <30
Creatinine, POC: 200 mg/dL
Microalbumin Ur, POC: 30 mg/L

## 2024-12-26 MED ORDER — TIRZEPATIDE 12.5 MG/0.5ML ~~LOC~~ SOAJ: 12.5000 mg | SUBCUTANEOUS | 3 refills | Status: AC

## 2024-12-26 MED ORDER — MELOXICAM 15 MG PO TABS: 15.0000 mg | ORAL_TABLET | Freq: Every day | ORAL | 0 refills | Status: AC

## 2024-12-26 NOTE — Patient Instructions (Signed)
 VISIT SUMMARY: During your visit, we discussed your persistent cold feet, weight loss, and other health concerns. We reviewed your diabetes management, cardiovascular health, and mood stabilization. We also addressed your TMJ pain and hypertriglyceridemia.  YOUR PLAN: COLD FEET: You have been experiencing persistent cold feet, likely due to vasoconstriction and weight loss. -Keep your feet warm by soaking them before bed and wearing socks. -Monitor for any skin changes or symptoms of Raynaud's phenomenon. -If symptoms persist into spring, we may consider a low-dose vasodilator, but be cautious due to potential blood pressure effects.  TYPE 2 DIABETES MELLITUS: Your diabetes is well-controlled with an A1c of 5.9. You are currently on Mounjaro  at 10 mg. -Increase Mounjaro  to 12.5 mg to aid in further weight loss. -Continue monitoring your A1c and blood glucose levels.  HYPERTRIGLYCERIDEMIA: Your triglycerides are slightly elevated, likely due to recent dietary intake. -Continue taking fenofibrate  at the current dose. -Recheck your triglyceride levels in a few months.  MAJOR DEPRESSIVE DISORDER, IN REMISSION: Your major depressive disorder is in remission and you are taking Prozac  as prescribed. -Continue taking Prozac  as prescribed.  TEMPOROMANDIBULAR JOINT DISORDER: You have occasional TMJ pain and were previously prescribed meloxicam . -Refilled meloxicam  for use as needed for TMJ pain.  If you have any problems before your next visit feel free to message me via MyChart (minor issues or questions) or call the office, otherwise you may reach out to schedule an office visit.  Thank you! Saddie Sacks, PA-C

## 2024-12-26 NOTE — Progress Notes (Signed)
 "  Established Patient Office Visit  Subjective   Patient ID: Samantha [name], female    DOB: [birth year]  Age: 44 y.o. MRN: [id number]  Chief Complaint  Patient presents with   Medication Management    HPI  Discussed the use of AI scribe software for clinical note transcription with the patient, who gave verbal consent to proceed.  History of Present Illness   Samantha [name] is a 44 year old female who presents with persistent cold feet.  Peripheral cold sensation - Persistent cold sensation in both feet, described as 'freezing' from the knees down - Symptoms present intermittently since last post-operative check-up - Worsening of symptoms at night when not wearing socks - Feet remain cold to the touch even when sweating - Wraps feet in blankets for warmth - No numbness, tingling, or sharp pains; any tingling attributed to cold exposure - No observed skin color changes such as pallor or cyanosis  Diabetes mellitus - Diabetes well-controlled with A1c of 5.9 - Not on any medication for diabetes management other than Mounjaro   Mood and psychiatric history - Takes Prozac  for mood stabilization. Tolerating well and without side effects.  Musculoskeletal symptoms - History of TMJ pain, previously prescribed meloxicam  with efficacy. Needs refill for prn use.          ROS Per HPI.    Objective:     BP 103/69   Pulse 74   Temp 97.9 F (36.6 C) (Oral)   Ht 5' 9 (1.753 m)   Wt 169 lb (76.7 kg)   LMP 08/15/2014 Comment: partial hysterectomy  SpO2 98%   BMI 24.96 kg/m    Physical Exam Constitutional:      General: She is not in acute distress.    Appearance: Normal appearance.  Cardiovascular:     Rate and Rhythm: Normal rate and regular rhythm.     Pulses:          Dorsalis pedis pulses are 2+ on the right side and 2+ on the left side.     Heart sounds: Normal heart sounds. No murmur heard.    No friction rub. No gallop.  Pulmonary:     Effort: Pulmonary effort  is normal. No respiratory distress.     Breath sounds: Normal breath sounds.  Musculoskeletal:        General: No swelling.     Cervical back: Neck supple.  Feet:     Right foot:     Protective Sensation: 10 sites tested.  10 sites sensed.     Left foot:     Protective Sensation: 10 sites tested.  10 sites sensed.  Lymphadenopathy:     Cervical: No cervical adenopathy.  Skin:    General: Skin is warm and dry.  Neurological:     General: No focal deficit present.     Mental Status: She is alert.  Psychiatric:        Mood and Affect: Mood normal.        Behavior: Behavior normal.        Thought Content: Thought content normal.      No results found for any visits on 12/26/24.  Last CBC Lab Results  Component Value Date   WBC 6.6 11/05/2024   HGB 13.7 11/05/2024   HCT 42.5 11/05/2024   MCV 88 11/05/2024   MCH 28.3 11/05/2024   RDW 13.1 11/05/2024   PLT 193 11/05/2024   Last metabolic panel Lab Results  Component Value Date   GLUCOSE  84 11/05/2024   NA 141 11/05/2024   K 4.0 11/05/2024   CL 98 11/05/2024   CO2 27 11/05/2024   BUN 12 11/05/2024   CREATININE 0.57 11/05/2024   EGFR 114 11/05/2024   CALCIUM  9.0 11/05/2024   PHOS 3.9 03/25/2009   PROT 6.7 11/05/2024   ALBUMIN 4.2 11/05/2024   LABGLOB 2.5 11/05/2024   AGRATIO 1.5 03/20/2023   BILITOT 0.3 11/05/2024   ALKPHOS 63 11/05/2024   AST 11 11/05/2024   ALT 12 11/05/2024   ANIONGAP 8 02/21/2022   Last lipids Lab Results  Component Value Date   CHOL 154 11/05/2024   HDL 41 11/05/2024   LDLCALC 79 11/05/2024   TRIG 199 (H) 11/05/2024   CHOLHDL 3.8 11/05/2024   Last hemoglobin A1c Lab Results  Component Value Date   HGBA1C 5.9 (H) 11/05/2024   Last thyroid  functions Lab Results  Component Value Date   TSH 1.040 11/05/2024   T3TOTAL 138 10/08/2019   FREET4 1.39 10/10/2019   Last vitamin D  Lab Results  Component Value Date   VD25OH 27.1 (L) 11/05/2024      The 10-year ASCVD risk score  (Arnett DK, et al., 2019) is: 1.4%    Assessment & Plan:   Type 2 diabetes mellitus with complication, without long-term current use of insulin (HCC) Assessment & Plan: Well-controlled with an A1c of 5.9. Current management includes Mounjaro  at 10 mg. She is considering increasing the dose to aid further weight loss. - Increased Mounjaro  to 12.5 mg for weight loss. - Continue monitoring A1c and blood glucose levels. - Collecting UACR today. Foot exam UTD. Patient reports she will schedule eye exam  Orders: -     Flu vaccine trivalent PF, 6mos and older(Flulaval,Afluria,Fluarix,Fluzone) -     Tirzepatide ; Inject 12.5 mg into the skin once a week.  Dispense: 6 mL; Refill: 3 -     POCT UA - Microalbumin  Nonintractable epilepsy without status epilepticus, unspecified epilepsy type (HCC)  Agenesis, corpus callosum (HCC) Assessment & Plan: Overview:  Dxd 2009 Affects memory and conigitive function; retention [facility], dr. Onita   Bipolar 1 disorder [facility])  Hyperlipidemia associated with type 2 diabetes mellitus (HCC) Assessment & Plan: Last lipid panel: LDL 79, HDL 41, Trig 199. Triglycerides elevated. Patient no longer taking rosuvastatin  due to leg cramps. Cont fenofibrate  54 mg daily for now. Suspect with further weight loss, they will improve. If needed, can increase dose of fenofibrate . The 10-year ASCVD risk score (Arnett DK, et al., 2019) is: 1.4%    TMJ dysfunction Assessment & Plan: Stable with Meloxicam  15 mg prn   Mood disorder Assessment & Plan: Mood stable on Prozac  40 mg. Continue this medication.   Hypertension associated with diabetes (HCC) Assessment & Plan: Blood pressure goal of less than 130/80. Encouraged to continue limiting salt and getting exercise is much as she can. Continue metoprolol  25 mg daily. Continue following with cardiology as well.    Cold feet Assessment & Plan: Intermittent cold feet likely due to vasoconstriction exacerbated  by winter weather and weight loss. No signs of peripheral artery disease or neuropathy. Thyroid  dysfunction and anemia ruled out. Foot exam normal with 2+ pedal pulses and capillary refill.  - Advised on keeping feet warm, such as soaking feet before bed and wearing socks. - Instructed to monitor for skin changes or symptoms of Raynaud's phenomenon. - Consider low-dose vasodilator if symptoms persist in spring, with caution due to potential blood pressure effects.   Other orders -  Meloxicam ; Take 1 tablet (15 mg total) by mouth daily.  Dispense: 30 tablet; Refill: 0    Return in about 6 months (around 06/25/2025) for Med check.    Saddie JULIANNA Sacks, PA-C "

## 2024-12-26 NOTE — Telephone Encounter (Signed)
 FYI Only or Action Required?: Action required by provider: lab or test result follow-up needed.  Patient was last seen in primary care on 12/26/2024 by Gayle Saddie FALCON, PA-C.  Called Nurse Triage reporting Results.  Symptoms began n/a.  Interventions attempted: Other: n/a.  Symptoms are: n/a.  Triage Disposition: Call PCP When Office is Open  Patient/caregiver understands and will follow disposition?: No, wishes to speak with PCP  Reason for Disposition  Caller requesting routine or non-urgent lab result  Answer Assessment - Initial Assessment Questions 1. REASON FOR CALL or QUESTION: What is your reason for calling today? or How can I best     What are the results of my urine test.  2. CALLER: Document the source of call. (e.g., laboratory staff, caregiver or patient).     Patient  Advised patient results have not been reviewed by provider, once reviewed she will receive communication on her results. No further questions.  Protocols used: PCP Call - No Triage-A-AH Copied from CRM [id number]. Topic: Clinical - Medical Advice >> Dec 26, 2024  3:48 PM Antony RAMAN wrote: Reason for CRM: wants to discuss urine test results

## 2024-12-26 NOTE — Assessment & Plan Note (Signed)
 Last lipid panel: LDL 79, HDL 41, Trig 199. Triglycerides elevated. Patient no longer taking rosuvastatin  due to leg cramps. Cont fenofibrate  54 mg daily for now. Suspect with further weight loss, they will improve. If needed, can increase dose of fenofibrate . The 10-year ASCVD risk score (Arnett DK, et al., 2019) is: 1.4%

## 2024-12-26 NOTE — Assessment & Plan Note (Signed)
 Well-controlled with an A1c of 5.9. Current management includes Mounjaro  at 10 mg. She is considering increasing the dose to aid further weight loss. - Increased Mounjaro  to 12.5 mg for weight loss. - Continue monitoring A1c and blood glucose levels. - Collecting UACR today. Foot exam UTD. Patient reports she will schedule eye exam

## 2024-12-26 NOTE — Assessment & Plan Note (Signed)
 Intermittent cold feet likely due to vasoconstriction exacerbated by winter weather and weight loss. No signs of peripheral artery disease or neuropathy. Thyroid  dysfunction and anemia ruled out. Foot exam normal with 2+ pedal pulses and capillary refill.  - Advised on keeping feet warm, such as soaking feet before bed and wearing socks. - Instructed to monitor for skin changes or symptoms of Raynaud's phenomenon. - Consider low-dose vasodilator if symptoms persist in spring, with caution due to potential blood pressure effects.

## 2024-12-26 NOTE — Assessment & Plan Note (Signed)
Blood pressure goal of less than 130/80.  Encouraged to continue limiting salt and getting exercise is much as she can.  Continue metoprolol 25 mg daily.  Continue following with cardiology as well.

## 2024-12-26 NOTE — Assessment & Plan Note (Signed)
 Overview:  Dxd 2009 Affects memory and conigitive function; retention [facility], dr. Onita

## 2024-12-26 NOTE — Assessment & Plan Note (Signed)
 Mood stable on Prozac  40 mg. Continue this medication.

## 2024-12-26 NOTE — Assessment & Plan Note (Signed)
 Stable with Meloxicam  15 mg prn

## 2024-12-30 ENCOUNTER — Other Ambulatory Visit

## 2024-12-30 NOTE — Progress Notes (Unsigned)
 "  Cardiology Clinic Note   Date: 01/02/2025 ID: Samantha [name], DOB Aug 20, 1981, MRN [id number]  Primary Cardiologist:  Lonni Hanson, MD  Chief Complaint   Samantha [name] is a 44 y.o. female who presents to the clinic today for routine follow up.   Patient Profile   ARELLY [name] is followed by Dr. Hanson for the history outlined below.      Past medical history significant for: Atypical chest pain. Nuclear stress test 06/09/2020: Normal pharmacologic myocardial perfusion stress test without significant ischemia or scar.  No significant coronary artery calcification on attenuation correction CT.  Low risk study. Aortic dilatation. Echo 08/20/2021: EF 60 to 65%.  No RWMA.  Grade I DD.  Normal RV size/function.  Mild LAE.  Aortic valve not well-visualized.  Trivial AI.  Mild dilatation of aortic root 42 mm, mild dilatation of ascending aorta 40 mm Palpitations.  Hypertension. Hyperlipidemia. Lipid panel 12/22/2023: LDL 100, HDL 47, TG 158, total 175. Migraines. OSA. GERD. T2DM. Mood disorder. GAD. Beals syndrome (FBN2).   In summary, in May 2021 patient underwent CT of abdomen pelvis with an incidental finding of enlarged heart.  She was noted to have multiple ED visits for chest wall pain.  She reported exertional chest discomfort and swelling overlying the suprasternal area as well as intermittent palpitations.  She was tachycardic at the time of her visit and placed on low-dose metoprolol .  Echo in June 2021 showed normal LV/RV function, no RWMA, normal diastolic parameters, trivial MR/AI, mildly dilated aortic root 41 mm.  In June 2021 she continued to have intermittent left upper chest discomfort and underwent nuclear stress testing which was a low risk study as detailed above.  Her palpitations are well-controlled with beta-blocker.    Patient was seen in the office on 07/16/2021 for preoperative risk assessment pending transvaginal tape and cystoscopy. She was doing well at that time  and determined to be an acceptable risk for upcoming surgery. Echo was updated September 2022 and showed normal LV/RV function, Grade I DD, stable mild dilatation of aortic root and ascending aorta.   Patient was last seen in the office by me on 01/01/2024 for overdue follow-up.  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  She had recently discovered she was positive for Beal's syndrome, a hereditary connective tissue disorder.  She reported her sister developed heart problems associated with that and other family members had various manifestations.  She reported occasional positional dizziness and transient blurred vision of right eye.  She shared she had a remote history of neck trauma and was told she had a leak in her right carotid with intermittent right-sided anterior neck pain.  Carotid ultrasound February 2025 was normal bilaterally.  CTA aorta demonstrated no aortic aneurysm.     History of Present Illness    Today, patient is accompanied by her husband. She reports continued chest wall pain unchanged from previous. She reports persistent palpitations that occur daily and feel like her heart is beating funny. She takes a small dose of Toprol  but it does not seem to suppress them. She endorses positional lightheadedness, dizziness and blurred vision when she drops her head down toward the ground then stands upright. She continues to work on weight loss with Mounjaro . She has dropped 30 lb since her last visit and a total of close to 70 lb. She is concerned about feeling cold all of  the time. She reports previously she was extremely hot natured. Recommended she speak to PCP or GYN regarding hormone testing. Her last TSH in December was normal.     ROS: All other systems reviewed and are otherwise negative except as noted in History of Present Illness.  EKGs/Labs Reviewed    EKG  Interpretation Date/Time:  Thursday January 02 2025 15:49:20 EST Ventricular Rate:  73 PR Interval:  168 QRS Duration:  98 QT Interval:  420 QTC Calculation: 462 R Axis:   -14  Text Interpretation: Normal sinus rhythm Moderate voltage criteria for LVH, may be normal variant ( R in aVL , Cornell product ) T wave abnormality, consider anterolateral ischemia When compared with ECG of 01-Jan-2024 10:08, No significant change was found Confirmed by Loistine Sober [phone]) on 01/02/2025 3:54:08 PM   11/05/2024: ALT 12; AST 11; BUN 12; Creatinine, Ser 0.57; Potassium 4.0; Sodium 141   11/05/2024: Hemoglobin 13.7; WBC 6.6   11/05/2024: TSH 1.040    Physical Exam    VS:  BP 106/70 (BP Location: Left Arm, Patient Position: Sitting, Cuff Size: Normal)   Pulse 73   Ht 5' 9 (1.753 m)   Wt 168 lb 12.8 oz (76.6 kg)   LMP 08/15/2014 Comment: partial hysterectomy  SpO2 98%   BMI 24.93 kg/m  , BMI Body mass index is 24.93 kg/m.  GEN: Well nourished, well developed, in no acute distress. Neck: No JVD or carotid bruits. Cardiac:  RRR.  No murmur. No rubs or gallops.   Respiratory:  Respirations regular and unlabored. Clear to auscultation without rales, wheezing or rhonchi. GI: Soft, nontender, nondistended. Extremities: Radials/DP/PT 2+ and equal bilaterally. No clubbing or cyanosis. No edema   Skin: Warm and dry, no rash. Neuro: Strength intact.  Assessment & Plan   Noncardiac chest pain Nuclear stress test July 2021 was a low risk study without significant ischemia or scar.  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 She will get sternal swelling and area will be tender to palpation. She mildly tender to palpation today. EKG without acute changes.  -No ischemic testing indicated at this time.    Aortic dilatation Echo September 2022 showed mild dilatation of aortic root 42 mm, mild dilatation of ascending aorta  40 mm.  CTA chest aorta February 2025 demonstrated no evidence of thoracic aortic aneurysm, dissection or other acute abnormality. Patient recently found out she has Beals Syndrome which is a hereditary connective tissue disorder.  She reports positional dizziness (see below). No presyncope or syncope.  - Repeat testing as clinically indicated.   Palpitations Patient reports persistent palpitations described as her heart beating funny. She takes a small dose of Toprol  but is unsure if it is helping. RRR on exam today. Recent labs in December demonstrated normal thyroid , kidney function, blood counts, and electrolytes.  - 14 day Zio.  -Continue Toprol .   Hypertension BP today 106/70. She reports occasional positional dizziness when she drops her head toward the ground then stands upright. Orthostatics not performed today secondary to patient suffering from a migraine. -Continue Toprol .   Dizziness/Blurred vision Patient reports dizziness and brief blurry vision when she drops her head toward the ground then stands upright. Carotid US  February 2025 was normal. Discussed not wanting to stop Toprol  secondary to palpitations. Will discuss further after heart monitor.  - Educated on slow position changes.   Disposition: 14 day Zio. Return in 1 year or sooner as  needed.          Signed, Barnie HERO. Elizaveta Mattice, DNP, NP-C  "

## 2025-01-02 ENCOUNTER — Ambulatory Visit: Attending: Student | Admitting: Student

## 2025-01-02 ENCOUNTER — Encounter: Payer: Self-pay | Admitting: Student

## 2025-01-02 ENCOUNTER — Ambulatory Visit

## 2025-01-02 VITALS — BP 106/70 | HR 73 | Ht 69.0 in | Wt 168.8 lb

## 2025-01-02 DIAGNOSIS — R0789 Other chest pain: Secondary | ICD-10-CM | POA: Diagnosis not present

## 2025-01-02 DIAGNOSIS — I77819 Aortic ectasia, unspecified site: Secondary | ICD-10-CM

## 2025-01-02 DIAGNOSIS — R002 Palpitations: Secondary | ICD-10-CM | POA: Diagnosis not present

## 2025-01-02 DIAGNOSIS — I10 Essential (primary) hypertension: Secondary | ICD-10-CM

## 2025-01-02 DIAGNOSIS — R42 Dizziness and giddiness: Secondary | ICD-10-CM

## 2025-01-02 NOTE — Patient Instructions (Signed)
 Medication Instructions:  Your physician recommends that you continue on your current medications as directed. Please refer to the Current Medication list given to you today.   *If you need a refill on your cardiac medications before your next appointment, please call your pharmacy*  Lab Work: No labs ordered today  If you have labs (blood work) drawn today and your tests are completely normal, you will receive your results only by: MyChart Message (if you have MyChart) OR A paper copy in the mail If you have any lab test that is abnormal or we need to change your treatment, we will call you to review the results.  Testing/Procedures: Your physician has recommended that you wear a Zio monitor.   This monitor is a medical device that records the hearts electrical activity. Doctors most often use these monitors to diagnose arrhythmias. Arrhythmias are problems with the speed or rhythm of the heartbeat. The monitor is a small device applied to your chest. You can wear one while you do your normal daily activities. While wearing this monitor if you have any symptoms to push the button and record what you felt. Once you have worn this monitor for the period of time provider prescribed (Usually 14 days), you will return the monitor device in the postage paid box. Once it is returned they will download the data collected and provide us  with a report which the provider will then review and we will call you with those results. Important tips:  Avoid showering during the first 24 hours of wearing the monitor. Avoid excessive sweating to help maximize wear time. Do not submerge the device, no hot tubs, and no swimming pools. Keep any lotions or oils away from the patch. After 24 hours you may shower with the patch on. Take brief showers with your back facing the shower head.  Do not remove patch once it has been placed because that will interrupt data and decrease adhesive wear time. Push the button when  you have any symptoms and write down what you were feeling. Once you have completed wearing your monitor, remove and place into box which has postage paid and place in your outgoing mailbox.  If for some reason you have misplaced your box then call our office and we can provide another box and/or mail it off for you.   Follow-Up: At [facility], you and your health needs are our priority.  As part of our continuing mission to provide you with exceptional heart care, our providers are all part of one team.  This team includes your primary Cardiologist (physician) and Advanced Practice Providers or APPs (Physician Assistants and Nurse Practitioners) who all work together to provide you with the care you need, when you need it.  Your next appointment:   1 year(s)  Provider:   You may see Lonni Hanson, MD or one of the following Advanced Practice Providers on your designated Care Team:   Lonni Meager, NP Lesley Maffucci, PA-C Bernardino Bring, PA-C Cadence Oriskany, PA-C Tylene Lunch, NP Barnie Hila, NP    We recommend signing up for the patient portal called MyChart.  Sign up information is provided on this After Visit Summary.  MyChart is used to connect with patients for Virtual Visits (Telemedicine).  Patients are able to view lab/test results, encounter notes, upcoming appointments, etc.  Non-urgent messages can be sent to your provider as well.   To learn more about what you can do with MyChart, go to [email or web address].

## 2025-03-03 ENCOUNTER — Ambulatory Visit: Admitting: Neurology

## 2025-05-15 ENCOUNTER — Ambulatory Visit

## 2025-06-20 ENCOUNTER — Other Ambulatory Visit

## 2025-06-26 ENCOUNTER — Ambulatory Visit
